# Patient Record
Sex: Female | Born: 1935 | Race: White | Hispanic: No | Smoking: Former smoker
Health system: Southern US, Community
[De-identification: ages and names within clinical notes are randomized; demographics above are authoritative.]

## PROBLEM LIST (undated history)

## (undated) DIAGNOSIS — M545 Low back pain, unspecified: Secondary | ICD-10-CM

## (undated) DIAGNOSIS — G309 Alzheimer's disease, unspecified: Secondary | ICD-10-CM

## (undated) DIAGNOSIS — F0280 Dementia in other diseases classified elsewhere without behavioral disturbance: Secondary | ICD-10-CM

## (undated) DIAGNOSIS — I10 Essential (primary) hypertension: Secondary | ICD-10-CM

## (undated) DIAGNOSIS — I639 Cerebral infarction, unspecified: Secondary | ICD-10-CM

## (undated) DIAGNOSIS — I472 Ventricular tachycardia: Secondary | ICD-10-CM

## (undated) DIAGNOSIS — J45909 Unspecified asthma, uncomplicated: Secondary | ICD-10-CM

## (undated) DIAGNOSIS — I480 Paroxysmal atrial fibrillation: Secondary | ICD-10-CM

## (undated) DIAGNOSIS — E039 Hypothyroidism, unspecified: Secondary | ICD-10-CM

## (undated) DIAGNOSIS — Z87891 Personal history of nicotine dependence: Secondary | ICD-10-CM

## (undated) DIAGNOSIS — C50919 Malignant neoplasm of unspecified site of unspecified female breast: Secondary | ICD-10-CM

## (undated) DIAGNOSIS — I82409 Acute embolism and thrombosis of unspecified deep veins of unspecified lower extremity: Secondary | ICD-10-CM

## (undated) DIAGNOSIS — E785 Hyperlipidemia, unspecified: Secondary | ICD-10-CM

## (undated) DIAGNOSIS — Z860101 Personal history of adenomatous and serrated colon polyps: Secondary | ICD-10-CM

## (undated) DIAGNOSIS — I2699 Other pulmonary embolism without acute cor pulmonale: Secondary | ICD-10-CM

## (undated) DIAGNOSIS — C801 Malignant (primary) neoplasm, unspecified: Secondary | ICD-10-CM

## (undated) DIAGNOSIS — K5790 Diverticulosis of intestine, part unspecified, without perforation or abscess without bleeding: Secondary | ICD-10-CM

## (undated) DIAGNOSIS — M67919 Unspecified disorder of synovium and tendon, unspecified shoulder: Secondary | ICD-10-CM

## (undated) DIAGNOSIS — Z853 Personal history of malignant neoplasm of breast: Secondary | ICD-10-CM

## (undated) DIAGNOSIS — M719 Bursopathy, unspecified: Secondary | ICD-10-CM

## (undated) DIAGNOSIS — I609 Nontraumatic subarachnoid hemorrhage, unspecified: Secondary | ICD-10-CM

## (undated) DIAGNOSIS — K219 Gastro-esophageal reflux disease without esophagitis: Secondary | ICD-10-CM

## (undated) DIAGNOSIS — M1990 Unspecified osteoarthritis, unspecified site: Secondary | ICD-10-CM

## (undated) DIAGNOSIS — I5032 Chronic diastolic (congestive) heart failure: Secondary | ICD-10-CM

## (undated) DIAGNOSIS — J189 Pneumonia, unspecified organism: Secondary | ICD-10-CM

## (undated) DIAGNOSIS — IMO0002 Reserved for concepts with insufficient information to code with codable children: Secondary | ICD-10-CM

## (undated) DIAGNOSIS — I272 Pulmonary hypertension, unspecified: Secondary | ICD-10-CM

## (undated) DIAGNOSIS — I872 Venous insufficiency (chronic) (peripheral): Secondary | ICD-10-CM

## (undated) DIAGNOSIS — Z86010 Personal history of colonic polyps: Secondary | ICD-10-CM

## (undated) DIAGNOSIS — N200 Calculus of kidney: Secondary | ICD-10-CM

## (undated) DIAGNOSIS — C3490 Malignant neoplasm of unspecified part of unspecified bronchus or lung: Secondary | ICD-10-CM

## (undated) DIAGNOSIS — G8929 Other chronic pain: Secondary | ICD-10-CM

## (undated) HISTORY — DX: Unspecified osteoarthritis, unspecified site: M19.90

## (undated) HISTORY — DX: Malignant (primary) neoplasm, unspecified: C80.1

## (undated) HISTORY — DX: Personal history of adenomatous and serrated colon polyps: Z86.0101

## (undated) HISTORY — DX: Diverticulosis of intestine, part unspecified, without perforation or abscess without bleeding: K57.90

## (undated) HISTORY — PX: BACK SURGERY: SHX140

## (undated) HISTORY — DX: Hyperlipidemia, unspecified: E78.5

## (undated) HISTORY — PX: LITHOTRIPSY: SUR834

## (undated) HISTORY — PX: ANTERIOR CERVICAL DECOMP/DISCECTOMY FUSION: SHX1161

## (undated) HISTORY — DX: Unspecified asthma, uncomplicated: J45.909

## (undated) HISTORY — DX: Acute embolism and thrombosis of unspecified deep veins of unspecified lower extremity: I82.409

## (undated) HISTORY — PX: ABDOMINAL HYSTERECTOMY: SHX81

## (undated) HISTORY — PX: HIP SURGERY: SHX245

## (undated) HISTORY — PX: VARICOSE VEIN SURGERY: SHX832

## (undated) HISTORY — DX: Reserved for concepts with insufficient information to code with codable children: IMO0002

## (undated) HISTORY — DX: Other pulmonary embolism without acute cor pulmonale: I26.99

## (undated) HISTORY — DX: Essential (primary) hypertension: I10

## (undated) HISTORY — PX: LUMBAR LAMINECTOMY: SHX95

## (undated) HISTORY — DX: Gastro-esophageal reflux disease without esophagitis: K21.9

## (undated) HISTORY — DX: Personal history of colonic polyps: Z86.010

## (undated) HISTORY — PX: LOBECTOMY: SHX5089

---

## 1983-11-30 HISTORY — PX: HEMORRHOID SURGERY: SHX153

## 1985-11-29 HISTORY — PX: KIDNEY STONE SURGERY: SHX686

## 1991-11-06 ENCOUNTER — Encounter (INDEPENDENT_AMBULATORY_CARE_PROVIDER_SITE_OTHER): Payer: Self-pay | Admitting: Gastroenterology

## 1993-11-29 HISTORY — PX: SHOULDER OPEN ROTATOR CUFF REPAIR: SHX2407

## 1995-03-11 ENCOUNTER — Encounter: Payer: Self-pay | Admitting: Gastroenterology

## 1998-04-30 ENCOUNTER — Encounter: Payer: Self-pay | Admitting: Gastroenterology

## 1998-05-12 ENCOUNTER — Encounter: Payer: Self-pay | Admitting: Gastroenterology

## 1998-11-15 ENCOUNTER — Emergency Department (HOSPITAL_COMMUNITY): Admission: EM | Admit: 1998-11-15 | Discharge: 1998-11-15 | Payer: Self-pay | Admitting: Emergency Medicine

## 1998-11-19 ENCOUNTER — Ambulatory Visit (HOSPITAL_COMMUNITY): Admission: RE | Admit: 1998-11-19 | Discharge: 1998-11-19 | Payer: Self-pay | Admitting: Neurosurgery

## 1998-12-02 ENCOUNTER — Ambulatory Visit (HOSPITAL_COMMUNITY): Admission: RE | Admit: 1998-12-02 | Discharge: 1998-12-02 | Payer: Self-pay | Admitting: Neurosurgery

## 1998-12-30 ENCOUNTER — Ambulatory Visit (HOSPITAL_COMMUNITY): Admission: RE | Admit: 1998-12-30 | Discharge: 1998-12-30 | Payer: Self-pay | Admitting: Neurosurgery

## 1999-01-15 ENCOUNTER — Ambulatory Visit (HOSPITAL_COMMUNITY): Admission: RE | Admit: 1999-01-15 | Discharge: 1999-01-15 | Payer: Self-pay | Admitting: Family Medicine

## 1999-02-06 ENCOUNTER — Ambulatory Visit: Admission: RE | Admit: 1999-02-06 | Discharge: 1999-02-06 | Payer: Self-pay | Admitting: Family Medicine

## 1999-03-03 ENCOUNTER — Encounter: Payer: Self-pay | Admitting: *Deleted

## 1999-03-03 ENCOUNTER — Inpatient Hospital Stay (HOSPITAL_COMMUNITY): Admission: AD | Admit: 1999-03-03 | Discharge: 1999-03-05 | Payer: Self-pay | Admitting: *Deleted

## 1999-03-11 ENCOUNTER — Ambulatory Visit: Admission: RE | Admit: 1999-03-11 | Discharge: 1999-03-11 | Payer: Self-pay | Admitting: Pulmonary Disease

## 2000-03-23 ENCOUNTER — Inpatient Hospital Stay (HOSPITAL_COMMUNITY): Admission: EM | Admit: 2000-03-23 | Discharge: 2000-03-25 | Payer: Self-pay | Admitting: *Deleted

## 2000-03-25 ENCOUNTER — Encounter: Payer: Self-pay | Admitting: Internal Medicine

## 2000-06-09 ENCOUNTER — Ambulatory Visit (HOSPITAL_COMMUNITY): Admission: RE | Admit: 2000-06-09 | Discharge: 2000-06-09 | Payer: Self-pay | Admitting: Neurosurgery

## 2000-07-21 ENCOUNTER — Other Ambulatory Visit: Admission: RE | Admit: 2000-07-21 | Discharge: 2000-07-21 | Payer: Self-pay | Admitting: *Deleted

## 2000-09-07 ENCOUNTER — Encounter: Payer: Self-pay | Admitting: Gastroenterology

## 2000-09-07 ENCOUNTER — Ambulatory Visit (HOSPITAL_COMMUNITY): Admission: RE | Admit: 2000-09-07 | Discharge: 2000-09-07 | Payer: Self-pay | Admitting: Gastroenterology

## 2000-09-21 ENCOUNTER — Encounter: Payer: Self-pay | Admitting: Gastroenterology

## 2000-09-21 ENCOUNTER — Other Ambulatory Visit: Admission: RE | Admit: 2000-09-21 | Discharge: 2000-09-21 | Payer: Self-pay | Admitting: Gastroenterology

## 2000-09-21 ENCOUNTER — Encounter (INDEPENDENT_AMBULATORY_CARE_PROVIDER_SITE_OTHER): Payer: Self-pay | Admitting: Specialist

## 2000-11-01 ENCOUNTER — Ambulatory Visit (HOSPITAL_COMMUNITY): Admission: RE | Admit: 2000-11-01 | Discharge: 2000-11-01 | Payer: Self-pay | Admitting: Neurosurgery

## 2000-11-29 HISTORY — PX: BREAST BIOPSY: SHX20

## 2001-02-13 ENCOUNTER — Inpatient Hospital Stay (HOSPITAL_COMMUNITY): Admission: RE | Admit: 2001-02-13 | Discharge: 2001-02-20 | Payer: Self-pay | Admitting: Neurosurgery

## 2001-02-20 ENCOUNTER — Inpatient Hospital Stay (HOSPITAL_COMMUNITY)
Admission: RE | Admit: 2001-02-20 | Discharge: 2001-02-24 | Payer: Self-pay | Admitting: Physical Medicine & Rehabilitation

## 2001-03-16 ENCOUNTER — Encounter: Admission: RE | Admit: 2001-03-16 | Discharge: 2001-03-16 | Payer: Self-pay | Admitting: Neurosurgery

## 2001-04-21 ENCOUNTER — Encounter: Admission: RE | Admit: 2001-04-21 | Discharge: 2001-04-21 | Payer: Self-pay | Admitting: *Deleted

## 2001-04-21 ENCOUNTER — Encounter (INDEPENDENT_AMBULATORY_CARE_PROVIDER_SITE_OTHER): Payer: Self-pay | Admitting: *Deleted

## 2001-04-21 ENCOUNTER — Other Ambulatory Visit: Admission: RE | Admit: 2001-04-21 | Discharge: 2001-04-21 | Payer: Self-pay | Admitting: *Deleted

## 2001-04-29 HISTORY — PX: MASTECTOMY: SHX3

## 2001-05-03 ENCOUNTER — Encounter: Admission: RE | Admit: 2001-05-03 | Discharge: 2001-05-03 | Payer: Self-pay | Admitting: Neurosurgery

## 2001-05-08 ENCOUNTER — Ambulatory Visit: Admission: RE | Admit: 2001-05-08 | Discharge: 2001-05-08 | Payer: Self-pay | Admitting: General Surgery

## 2001-06-02 ENCOUNTER — Encounter (INDEPENDENT_AMBULATORY_CARE_PROVIDER_SITE_OTHER): Payer: Self-pay | Admitting: Specialist

## 2001-06-02 ENCOUNTER — Observation Stay (HOSPITAL_COMMUNITY): Admission: RE | Admit: 2001-06-02 | Discharge: 2001-06-04 | Payer: Self-pay | Admitting: General Surgery

## 2001-07-04 ENCOUNTER — Ambulatory Visit: Admission: RE | Admit: 2001-07-04 | Discharge: 2001-10-02 | Payer: Self-pay | Admitting: Radiation Oncology

## 2001-08-08 ENCOUNTER — Encounter: Admission: RE | Admit: 2001-08-08 | Discharge: 2001-08-08 | Payer: Self-pay | Admitting: Neurosurgery

## 2001-08-31 ENCOUNTER — Ambulatory Visit (HOSPITAL_COMMUNITY): Admission: RE | Admit: 2001-08-31 | Discharge: 2001-08-31 | Payer: Self-pay | Admitting: Radiation Oncology

## 2002-03-30 ENCOUNTER — Encounter: Admission: RE | Admit: 2002-03-30 | Discharge: 2002-03-30 | Payer: Self-pay | Admitting: *Deleted

## 2002-04-16 ENCOUNTER — Encounter: Payer: Self-pay | Admitting: Oncology

## 2002-04-16 ENCOUNTER — Ambulatory Visit (HOSPITAL_COMMUNITY): Admission: RE | Admit: 2002-04-16 | Discharge: 2002-04-16 | Payer: Self-pay | Admitting: Oncology

## 2002-04-20 ENCOUNTER — Encounter: Payer: Self-pay | Admitting: Oncology

## 2002-04-20 ENCOUNTER — Ambulatory Visit (HOSPITAL_COMMUNITY): Admission: RE | Admit: 2002-04-20 | Discharge: 2002-04-20 | Payer: Self-pay | Admitting: Oncology

## 2002-06-07 ENCOUNTER — Encounter: Payer: Self-pay | Admitting: Emergency Medicine

## 2002-06-07 ENCOUNTER — Emergency Department (HOSPITAL_COMMUNITY): Admission: EM | Admit: 2002-06-07 | Discharge: 2002-06-07 | Payer: Self-pay | Admitting: Emergency Medicine

## 2003-03-12 ENCOUNTER — Encounter: Admission: RE | Admit: 2003-03-12 | Discharge: 2003-03-12 | Payer: Self-pay | Admitting: Family Medicine

## 2003-03-12 ENCOUNTER — Encounter: Payer: Self-pay | Admitting: Family Medicine

## 2003-05-29 ENCOUNTER — Emergency Department (HOSPITAL_COMMUNITY): Admission: EM | Admit: 2003-05-29 | Discharge: 2003-05-29 | Payer: Self-pay | Admitting: Emergency Medicine

## 2003-05-29 ENCOUNTER — Encounter: Payer: Self-pay | Admitting: Emergency Medicine

## 2003-07-04 ENCOUNTER — Encounter: Payer: Self-pay | Admitting: Gastroenterology

## 2003-07-04 ENCOUNTER — Encounter: Payer: Self-pay | Admitting: Oncology

## 2003-07-04 ENCOUNTER — Ambulatory Visit (HOSPITAL_COMMUNITY): Admission: RE | Admit: 2003-07-04 | Discharge: 2003-07-04 | Payer: Self-pay | Admitting: Oncology

## 2003-07-11 ENCOUNTER — Encounter: Admission: RE | Admit: 2003-07-11 | Discharge: 2003-10-09 | Payer: Self-pay | Admitting: Oncology

## 2003-07-18 ENCOUNTER — Encounter: Payer: Self-pay | Admitting: Family Medicine

## 2003-07-18 ENCOUNTER — Encounter: Admission: RE | Admit: 2003-07-18 | Discharge: 2003-07-18 | Payer: Self-pay | Admitting: Family Medicine

## 2003-07-25 ENCOUNTER — Inpatient Hospital Stay (HOSPITAL_COMMUNITY): Admission: RE | Admit: 2003-07-25 | Discharge: 2003-07-26 | Payer: Self-pay | Admitting: Neurosurgery

## 2003-11-14 ENCOUNTER — Encounter: Payer: Self-pay | Admitting: Gastroenterology

## 2003-11-20 ENCOUNTER — Encounter: Payer: Self-pay | Admitting: Gastroenterology

## 2003-12-04 ENCOUNTER — Other Ambulatory Visit: Admission: RE | Admit: 2003-12-04 | Discharge: 2003-12-04 | Payer: Self-pay | Admitting: Family Medicine

## 2004-08-17 ENCOUNTER — Emergency Department (HOSPITAL_COMMUNITY): Admission: EM | Admit: 2004-08-17 | Discharge: 2004-08-18 | Payer: Self-pay | Admitting: Emergency Medicine

## 2004-09-17 ENCOUNTER — Ambulatory Visit (HOSPITAL_COMMUNITY): Admission: RE | Admit: 2004-09-17 | Discharge: 2004-09-17 | Payer: Self-pay | Admitting: Neurosurgery

## 2004-10-06 ENCOUNTER — Ambulatory Visit: Payer: Self-pay | Admitting: Family Medicine

## 2004-11-02 ENCOUNTER — Inpatient Hospital Stay (HOSPITAL_COMMUNITY): Admission: RE | Admit: 2004-11-02 | Discharge: 2004-11-06 | Payer: Self-pay | Admitting: Neurosurgery

## 2004-12-17 ENCOUNTER — Ambulatory Visit: Payer: Self-pay | Admitting: Family Medicine

## 2004-12-28 ENCOUNTER — Ambulatory Visit: Payer: Self-pay | Admitting: Oncology

## 2005-01-29 ENCOUNTER — Ambulatory Visit: Payer: Self-pay | Admitting: Family Medicine

## 2005-02-16 ENCOUNTER — Ambulatory Visit: Payer: Self-pay | Admitting: Oncology

## 2005-03-17 ENCOUNTER — Ambulatory Visit: Payer: Self-pay | Admitting: Family Medicine

## 2005-05-12 ENCOUNTER — Ambulatory Visit: Payer: Self-pay | Admitting: Family Medicine

## 2005-05-14 ENCOUNTER — Encounter: Admission: RE | Admit: 2005-05-14 | Discharge: 2005-05-14 | Payer: Self-pay | Admitting: Family Medicine

## 2005-07-07 ENCOUNTER — Ambulatory Visit: Payer: Self-pay | Admitting: Family Medicine

## 2005-09-21 ENCOUNTER — Ambulatory Visit (HOSPITAL_COMMUNITY): Admission: RE | Admit: 2005-09-21 | Discharge: 2005-09-21 | Payer: Self-pay | Admitting: Neurosurgery

## 2005-10-12 ENCOUNTER — Ambulatory Visit: Payer: Self-pay | Admitting: Family Medicine

## 2005-10-27 ENCOUNTER — Ambulatory Visit: Payer: Self-pay | Admitting: Family Medicine

## 2005-11-02 ENCOUNTER — Ambulatory Visit: Payer: Self-pay | Admitting: Gastroenterology

## 2005-11-08 ENCOUNTER — Ambulatory Visit: Payer: Self-pay | Admitting: Gastroenterology

## 2005-11-29 HISTORY — PX: CHOLECYSTECTOMY: SHX55

## 2006-01-28 ENCOUNTER — Ambulatory Visit: Payer: Self-pay | Admitting: Oncology

## 2006-02-15 ENCOUNTER — Ambulatory Visit: Payer: Self-pay | Admitting: Family Medicine

## 2006-02-18 ENCOUNTER — Encounter: Admission: RE | Admit: 2006-02-18 | Discharge: 2006-02-18 | Payer: Self-pay | Admitting: Oncology

## 2006-03-09 ENCOUNTER — Ambulatory Visit: Payer: Self-pay | Admitting: Gastroenterology

## 2006-03-10 ENCOUNTER — Ambulatory Visit: Payer: Self-pay | Admitting: Gastroenterology

## 2006-03-14 ENCOUNTER — Encounter: Payer: Self-pay | Admitting: Gastroenterology

## 2006-03-14 ENCOUNTER — Ambulatory Visit (HOSPITAL_COMMUNITY): Admission: RE | Admit: 2006-03-14 | Discharge: 2006-03-14 | Payer: Self-pay | Admitting: Gastroenterology

## 2006-03-22 ENCOUNTER — Ambulatory Visit: Payer: Self-pay | Admitting: Family Medicine

## 2006-03-22 ENCOUNTER — Other Ambulatory Visit: Admission: RE | Admit: 2006-03-22 | Discharge: 2006-03-22 | Payer: Self-pay | Admitting: Family Medicine

## 2006-03-22 ENCOUNTER — Encounter: Payer: Self-pay | Admitting: Family Medicine

## 2006-03-22 LAB — CONVERTED CEMR LAB

## 2006-06-08 ENCOUNTER — Ambulatory Visit: Payer: Self-pay | Admitting: Family Medicine

## 2006-07-28 ENCOUNTER — Ambulatory Visit: Payer: Self-pay | Admitting: Family Medicine

## 2006-08-02 ENCOUNTER — Ambulatory Visit: Payer: Self-pay | Admitting: Family Medicine

## 2006-08-03 ENCOUNTER — Encounter: Payer: Self-pay | Admitting: Gastroenterology

## 2006-08-03 ENCOUNTER — Ambulatory Visit: Payer: Self-pay | Admitting: *Deleted

## 2006-09-06 ENCOUNTER — Ambulatory Visit: Payer: Self-pay | Admitting: Gastroenterology

## 2006-09-09 ENCOUNTER — Ambulatory Visit: Payer: Self-pay | Admitting: Gastroenterology

## 2006-09-27 ENCOUNTER — Encounter: Payer: Self-pay | Admitting: Family Medicine

## 2006-10-06 ENCOUNTER — Ambulatory Visit: Payer: Self-pay | Admitting: Family Medicine

## 2006-10-24 ENCOUNTER — Encounter (INDEPENDENT_AMBULATORY_CARE_PROVIDER_SITE_OTHER): Payer: Self-pay | Admitting: Specialist

## 2006-10-24 ENCOUNTER — Encounter (INDEPENDENT_AMBULATORY_CARE_PROVIDER_SITE_OTHER): Payer: Self-pay | Admitting: *Deleted

## 2006-10-24 ENCOUNTER — Ambulatory Visit (HOSPITAL_COMMUNITY): Admission: RE | Admit: 2006-10-24 | Discharge: 2006-10-24 | Payer: Self-pay | Admitting: General Surgery

## 2007-01-10 ENCOUNTER — Encounter: Payer: Self-pay | Admitting: Family Medicine

## 2007-01-19 ENCOUNTER — Encounter: Admission: RE | Admit: 2007-01-19 | Discharge: 2007-01-19 | Payer: Self-pay | Admitting: Neurosurgery

## 2007-01-26 ENCOUNTER — Ambulatory Visit: Payer: Self-pay | Admitting: Oncology

## 2007-01-31 LAB — COMPREHENSIVE METABOLIC PANEL
ALT: 13 U/L (ref 0–35)
AST: 22 U/L (ref 0–37)
BUN: 30 mg/dL — ABNORMAL HIGH (ref 6–23)
CO2: 26 mEq/L (ref 19–32)
Creatinine, Ser: 1.14 mg/dL (ref 0.40–1.20)
Total Bilirubin: 0.8 mg/dL (ref 0.3–1.2)

## 2007-01-31 LAB — CBC WITH DIFFERENTIAL/PLATELET
BASO%: 0.2 % (ref 0.0–2.0)
EOS%: 1.3 % (ref 0.0–7.0)
MCH: 29.2 pg (ref 26.0–34.0)
MCHC: 34.7 g/dL (ref 32.0–36.0)
MONO#: 0.6 10*3/uL (ref 0.1–0.9)
RBC: 4.42 10*6/uL (ref 3.70–5.32)
RDW: 13.6 % (ref 11.3–14.5)
WBC: 7.6 10*3/uL (ref 3.9–10.0)
lymph#: 1.7 10*3/uL (ref 0.9–3.3)

## 2007-01-31 LAB — LACTATE DEHYDROGENASE: LDH: 211 U/L (ref 94–250)

## 2007-02-07 ENCOUNTER — Encounter: Payer: Self-pay | Admitting: Family Medicine

## 2007-02-15 ENCOUNTER — Ambulatory Visit (HOSPITAL_COMMUNITY): Admission: RE | Admit: 2007-02-15 | Discharge: 2007-02-15 | Payer: Self-pay | Admitting: Anesthesiology

## 2007-04-08 ENCOUNTER — Encounter: Admission: RE | Admit: 2007-04-08 | Discharge: 2007-04-08 | Payer: Self-pay | Admitting: Orthopedic Surgery

## 2007-04-12 ENCOUNTER — Inpatient Hospital Stay (HOSPITAL_COMMUNITY): Admission: RE | Admit: 2007-04-12 | Discharge: 2007-04-13 | Payer: Self-pay | Admitting: Neurosurgery

## 2007-06-06 ENCOUNTER — Encounter: Payer: Self-pay | Admitting: Family Medicine

## 2007-06-06 DIAGNOSIS — Z8739 Personal history of other diseases of the musculoskeletal system and connective tissue: Secondary | ICD-10-CM

## 2007-06-06 DIAGNOSIS — E785 Hyperlipidemia, unspecified: Secondary | ICD-10-CM | POA: Insufficient documentation

## 2007-06-06 DIAGNOSIS — I10 Essential (primary) hypertension: Secondary | ICD-10-CM

## 2007-06-14 ENCOUNTER — Encounter: Payer: Self-pay | Admitting: Family Medicine

## 2007-06-14 ENCOUNTER — Ambulatory Visit: Payer: Self-pay | Admitting: Family Medicine

## 2007-06-14 LAB — CONVERTED CEMR LAB
BUN: 21 mg/dL (ref 6–23)
Calcium: 9.5 mg/dL (ref 8.4–10.5)
Cholesterol: 247 mg/dL (ref 0–200)
Creatinine, Ser: 0.9 mg/dL (ref 0.4–1.2)
Direct LDL: 144.3 mg/dL
Eosinophils Relative: 2.4 % (ref 0.0–5.0)
GFR calc non Af Amer: 66 mL/min
Glucose, Bld: 107 mg/dL — ABNORMAL HIGH (ref 70–99)
Hemoglobin: 14 g/dL (ref 12.0–15.0)
Lymphocytes Relative: 26.5 % (ref 12.0–46.0)
MCV: 86.1 fL (ref 78.0–100.0)
Monocytes Relative: 8.2 % (ref 3.0–11.0)
Neutrophils Relative %: 62.9 % (ref 43.0–77.0)
RDW: 13.2 % (ref 11.5–14.6)
VLDL: 92 mg/dL — ABNORMAL HIGH (ref 0–40)
WBC: 6.3 10*3/uL (ref 4.5–10.5)

## 2007-08-24 ENCOUNTER — Ambulatory Visit: Payer: Self-pay | Admitting: Family Medicine

## 2007-08-24 DIAGNOSIS — E039 Hypothyroidism, unspecified: Secondary | ICD-10-CM | POA: Insufficient documentation

## 2007-08-30 LAB — CONVERTED CEMR LAB
HDL: 44.7 mg/dL (ref >39.0)
TSH: 2.74 microintl units/mL (ref 0.35–5.50)
Total CHOL/HDL Ratio: 4.8
Triglycerides: 150 mg/dL — ABNORMAL HIGH (ref 0–149)
VLDL: 30 mg/dL (ref 0–40)

## 2007-09-21 ENCOUNTER — Telehealth (INDEPENDENT_AMBULATORY_CARE_PROVIDER_SITE_OTHER): Payer: Self-pay | Admitting: *Deleted

## 2007-09-29 ENCOUNTER — Ambulatory Visit: Payer: Self-pay | Admitting: Family Medicine

## 2007-10-04 ENCOUNTER — Encounter: Payer: Self-pay | Admitting: Family Medicine

## 2007-10-12 ENCOUNTER — Telehealth: Payer: Self-pay | Admitting: Family Medicine

## 2007-11-01 ENCOUNTER — Telehealth: Payer: Self-pay | Admitting: Family Medicine

## 2007-11-02 ENCOUNTER — Ambulatory Visit: Payer: Self-pay | Admitting: Family Medicine

## 2007-11-07 ENCOUNTER — Encounter: Payer: Self-pay | Admitting: Family Medicine

## 2007-12-04 ENCOUNTER — Ambulatory Visit (HOSPITAL_COMMUNITY): Admission: RE | Admit: 2007-12-04 | Discharge: 2007-12-04 | Payer: Self-pay | Admitting: Orthopedic Surgery

## 2007-12-20 ENCOUNTER — Ambulatory Visit: Payer: Self-pay | Admitting: Family Medicine

## 2008-01-09 ENCOUNTER — Ambulatory Visit (HOSPITAL_COMMUNITY): Admission: RE | Admit: 2008-01-09 | Discharge: 2008-01-09 | Payer: Self-pay | Admitting: Orthopedic Surgery

## 2008-01-26 ENCOUNTER — Ambulatory Visit: Payer: Self-pay | Admitting: Oncology

## 2008-01-30 LAB — COMPREHENSIVE METABOLIC PANEL WITH GFR
ALT: 18 U/L (ref 0–35)
AST: 21 U/L (ref 0–37)
Albumin: 4.5 g/dL (ref 3.5–5.2)
Alkaline Phosphatase: 85 U/L (ref 39–117)
BUN: 21 mg/dL (ref 6–23)
CO2: 30 meq/L (ref 19–32)
Calcium: 9.5 mg/dL (ref 8.4–10.5)
Chloride: 100 meq/L (ref 96–112)
Creatinine, Ser: 0.97 mg/dL (ref 0.40–1.20)
Glucose, Bld: 110 mg/dL — ABNORMAL HIGH (ref 70–99)
Potassium: 3.8 meq/L (ref 3.5–5.3)
Sodium: 140 meq/L (ref 135–145)
Total Bilirubin: 0.6 mg/dL (ref 0.3–1.2)
Total Protein: 7.6 g/dL (ref 6.0–8.3)

## 2008-01-30 LAB — CBC WITH DIFFERENTIAL/PLATELET
BASO%: 0.5 % (ref 0.0–2.0)
Basophils Absolute: 0 10e3/uL (ref 0.0–0.1)
EOS%: 4 % (ref 0.0–7.0)
Eosinophils Absolute: 0.3 10e3/uL (ref 0.0–0.5)
HCT: 39.1 % (ref 34.8–46.6)
HGB: 13.4 g/dL (ref 11.6–15.9)
LYMPH%: 26.9 % (ref 14.0–48.0)
MCH: 28.6 pg (ref 26.0–34.0)
MCHC: 34.2 g/dL (ref 32.0–36.0)
MCV: 83.7 fL (ref 81.0–101.0)
MONO#: 0.5 10e3/uL (ref 0.1–0.9)
MONO%: 6.8 % (ref 0.0–13.0)
NEUT#: 4.6 10e3/uL (ref 1.5–6.5)
NEUT%: 61.8 % (ref 39.6–76.8)
Platelets: 360 10e3/uL (ref 145–400)
RBC: 4.67 10e6/uL (ref 3.70–5.32)
RDW: 13.9 % (ref 11.3–14.5)
WBC: 7.5 10e3/uL (ref 3.9–10.0)
lymph#: 2 10e3/uL (ref 0.9–3.3)

## 2008-01-30 LAB — LACTATE DEHYDROGENASE: LDH: 185 U/L (ref 94–250)

## 2008-02-01 ENCOUNTER — Ambulatory Visit: Payer: Self-pay | Admitting: Family Medicine

## 2008-02-06 ENCOUNTER — Encounter: Payer: Self-pay | Admitting: Family Medicine

## 2008-02-07 ENCOUNTER — Encounter: Payer: Self-pay | Admitting: Family Medicine

## 2008-02-16 ENCOUNTER — Encounter: Payer: Self-pay | Admitting: Family Medicine

## 2008-02-16 ENCOUNTER — Ambulatory Visit (HOSPITAL_COMMUNITY): Admission: RE | Admit: 2008-02-16 | Discharge: 2008-02-16 | Payer: Self-pay | Admitting: Internal Medicine

## 2008-02-16 ENCOUNTER — Ambulatory Visit: Payer: Self-pay | Admitting: Internal Medicine

## 2008-02-16 DIAGNOSIS — M542 Cervicalgia: Secondary | ICD-10-CM | POA: Insufficient documentation

## 2008-02-16 DIAGNOSIS — R079 Chest pain, unspecified: Secondary | ICD-10-CM | POA: Insufficient documentation

## 2008-02-22 ENCOUNTER — Ambulatory Visit: Payer: Self-pay | Admitting: Family Medicine

## 2008-02-22 DIAGNOSIS — Z853 Personal history of malignant neoplasm of breast: Secondary | ICD-10-CM

## 2008-02-22 HISTORY — DX: Personal history of malignant neoplasm of breast: Z85.3

## 2008-05-14 ENCOUNTER — Ambulatory Visit: Payer: Self-pay | Admitting: Family Medicine

## 2008-05-16 ENCOUNTER — Ambulatory Visit: Payer: Self-pay | Admitting: Cardiology

## 2008-05-17 LAB — CONVERTED CEMR LAB
Albumin: 4 g/dL (ref 3.5–5.2)
Basophils Relative: 0.4 % (ref 0.0–1.0)
Chloride: 105 meq/L (ref 96–112)
Creatinine, Ser: 0.9 mg/dL (ref 0.4–1.2)
Eosinophils Absolute: 0.1 10*3/uL (ref 0.0–0.7)
GFR calc Af Amer: 80 mL/min
Lymphocytes Relative: 29.2 % (ref 12.0–46.0)
MCHC: 34.2 g/dL (ref 30.0–36.0)
Monocytes Absolute: 0.4 10*3/uL (ref 0.1–1.0)
Potassium: 4.5 meq/L (ref 3.5–5.1)
RBC: 4.52 M/uL (ref 3.87–5.11)
Total CK: 90 units/L (ref 7–177)
WBC: 6.1 10*3/uL (ref 4.5–10.5)

## 2008-05-24 ENCOUNTER — Encounter: Admission: RE | Admit: 2008-05-24 | Discharge: 2008-05-24 | Payer: Self-pay | Admitting: Neurosurgery

## 2008-05-30 ENCOUNTER — Encounter: Payer: Self-pay | Admitting: Family Medicine

## 2008-08-07 ENCOUNTER — Other Ambulatory Visit: Admission: RE | Admit: 2008-08-07 | Discharge: 2008-08-07 | Payer: Self-pay | Admitting: Family Medicine

## 2008-08-07 ENCOUNTER — Encounter: Payer: Self-pay | Admitting: Family Medicine

## 2008-08-07 ENCOUNTER — Ambulatory Visit: Payer: Self-pay | Admitting: Family Medicine

## 2008-08-12 ENCOUNTER — Encounter: Payer: Self-pay | Admitting: Family Medicine

## 2008-08-15 ENCOUNTER — Ambulatory Visit: Payer: Self-pay | Admitting: Family Medicine

## 2008-08-16 ENCOUNTER — Encounter: Payer: Self-pay | Admitting: Family Medicine

## 2008-08-30 ENCOUNTER — Ambulatory Visit: Payer: Self-pay | Admitting: Family Medicine

## 2008-09-16 ENCOUNTER — Inpatient Hospital Stay (HOSPITAL_COMMUNITY): Admission: RE | Admit: 2008-09-16 | Discharge: 2008-09-20 | Payer: Self-pay | Admitting: Neurosurgery

## 2008-09-20 ENCOUNTER — Ambulatory Visit: Payer: Self-pay | Admitting: Physical Medicine & Rehabilitation

## 2008-09-20 ENCOUNTER — Inpatient Hospital Stay (HOSPITAL_COMMUNITY)
Admission: RE | Admit: 2008-09-20 | Discharge: 2008-09-28 | Payer: Self-pay | Admitting: Physical Medicine & Rehabilitation

## 2008-09-27 ENCOUNTER — Encounter: Payer: Self-pay | Admitting: Family Medicine

## 2008-10-22 ENCOUNTER — Encounter: Payer: Self-pay | Admitting: Family Medicine

## 2008-10-29 ENCOUNTER — Encounter: Payer: Self-pay | Admitting: Family Medicine

## 2008-11-05 ENCOUNTER — Encounter: Payer: Self-pay | Admitting: Family Medicine

## 2008-11-26 ENCOUNTER — Encounter: Payer: Self-pay | Admitting: Family Medicine

## 2008-12-23 ENCOUNTER — Encounter: Payer: Self-pay | Admitting: Family Medicine

## 2008-12-30 ENCOUNTER — Encounter: Admission: RE | Admit: 2008-12-30 | Discharge: 2008-12-30 | Payer: Self-pay | Admitting: Neurosurgery

## 2009-01-12 ENCOUNTER — Encounter: Admission: RE | Admit: 2009-01-12 | Discharge: 2009-01-12 | Payer: Self-pay | Admitting: Orthopedic Surgery

## 2009-01-24 ENCOUNTER — Ambulatory Visit: Payer: Self-pay | Admitting: Oncology

## 2009-01-29 LAB — CBC WITH DIFFERENTIAL/PLATELET
BASO%: 0.4 % (ref 0.0–2.0)
EOS%: 3.2 % (ref 0.0–7.0)
MCH: 27.4 pg (ref 25.1–34.0)
MCV: 80.7 fL (ref 79.5–101.0)
MONO%: 7.2 % (ref 0.0–14.0)
RBC: 4.34 10*6/uL (ref 3.70–5.45)
RDW: 15.5 % — ABNORMAL HIGH (ref 11.2–14.5)

## 2009-01-29 LAB — COMPREHENSIVE METABOLIC PANEL
AST: 23 U/L (ref 0–37)
Albumin: 3.9 g/dL (ref 3.5–5.2)
Alkaline Phosphatase: 123 U/L — ABNORMAL HIGH (ref 39–117)
Potassium: 3.6 mEq/L (ref 3.5–5.3)
Sodium: 139 mEq/L (ref 135–145)
Total Protein: 8.5 g/dL — ABNORMAL HIGH (ref 6.0–8.3)

## 2009-02-04 ENCOUNTER — Encounter: Payer: Self-pay | Admitting: Family Medicine

## 2009-02-04 ENCOUNTER — Ambulatory Visit: Payer: Self-pay | Admitting: Gastroenterology

## 2009-02-04 LAB — COMPREHENSIVE METABOLIC PANEL
Albumin: 4.2 g/dL (ref 3.5–5.2)
Alkaline Phosphatase: 128 U/L — ABNORMAL HIGH (ref 39–117)
BUN: 19 mg/dL (ref 6–23)
Glucose, Bld: 102 mg/dL — ABNORMAL HIGH (ref 70–99)
Potassium: 4.1 mEq/L (ref 3.5–5.3)
Total Bilirubin: 0.6 mg/dL (ref 0.3–1.2)

## 2009-02-06 LAB — IMMUNOFIXATION ELECTROPHORESIS: IgA: 389 mg/dL — ABNORMAL HIGH (ref 68–378)

## 2009-02-12 ENCOUNTER — Ambulatory Visit: Payer: Self-pay | Admitting: Family Medicine

## 2009-02-12 DIAGNOSIS — G609 Hereditary and idiopathic neuropathy, unspecified: Secondary | ICD-10-CM | POA: Insufficient documentation

## 2009-02-12 DIAGNOSIS — D649 Anemia, unspecified: Secondary | ICD-10-CM | POA: Insufficient documentation

## 2009-02-12 DIAGNOSIS — N390 Urinary tract infection, site not specified: Secondary | ICD-10-CM

## 2009-02-12 DIAGNOSIS — E559 Vitamin D deficiency, unspecified: Secondary | ICD-10-CM | POA: Insufficient documentation

## 2009-02-12 LAB — CONVERTED CEMR LAB
Ketones, urine, test strip: NEGATIVE
pH: 6

## 2009-02-15 ENCOUNTER — Ambulatory Visit (HOSPITAL_COMMUNITY): Admission: RE | Admit: 2009-02-15 | Discharge: 2009-02-15 | Payer: Self-pay | Admitting: Oncology

## 2009-02-15 ENCOUNTER — Encounter: Payer: Self-pay | Admitting: Family Medicine

## 2009-02-18 ENCOUNTER — Ambulatory Visit: Payer: Self-pay | Admitting: Gastroenterology

## 2009-02-18 ENCOUNTER — Encounter: Payer: Self-pay | Admitting: Gastroenterology

## 2009-02-18 HISTORY — PX: COLONOSCOPY: SHX174

## 2009-02-18 LAB — HM COLONOSCOPY

## 2009-02-19 ENCOUNTER — Encounter: Payer: Self-pay | Admitting: Gastroenterology

## 2009-02-24 ENCOUNTER — Encounter: Payer: Self-pay | Admitting: Family Medicine

## 2009-02-24 LAB — CONVERTED CEMR LAB
ALT: 12 units/L (ref 0–35)
AST: 21 units/L (ref 0–37)
Alkaline Phosphatase: 105 units/L (ref 39–117)
BUN: 17 mg/dL (ref 6–23)
CO2: 30 meq/L (ref 19–32)
Chloride: 104 meq/L (ref 96–112)
Cholesterol: 196 mg/dL (ref 0–200)
GFR calc non Af Amer: 87.72 mL/min (ref >60)
HCT: 35.2 % — ABNORMAL LOW (ref 36.0–46.0)
HDL: 40.2 mg/dL (ref >39.00)
Hemoglobin: 11.6 g/dL — ABNORMAL LOW (ref 12.0–15.0)
LDL Cholesterol: 138 mg/dL — ABNORMAL HIGH (ref 0–99)
MCHC: 33 g/dL (ref 30.0–36.0)
MCV: 81.3 fL (ref 78.0–100.0)
Monocytes Absolute: 0.4 10*3/uL (ref 0.1–1.0)
Monocytes Relative: 7.8 % (ref 3.0–12.0)
Neutrophils Relative %: 61.6 % (ref 43.0–77.0)
Potassium: 3.8 meq/L (ref 3.5–5.1)
TSH: 2.42 microintl units/mL (ref 0.35–5.50)
Total CHOL/HDL Ratio: 5
VLDL: 17.8 mg/dL (ref 0.0–40.0)

## 2009-02-26 LAB — CONVERTED CEMR LAB: Vit D, 25-Hydroxy: 18 ng/mL — ABNORMAL LOW (ref 30–89)

## 2009-03-12 ENCOUNTER — Telehealth: Payer: Self-pay | Admitting: Family Medicine

## 2009-03-21 ENCOUNTER — Encounter: Payer: Self-pay | Admitting: Family Medicine

## 2009-05-22 ENCOUNTER — Ambulatory Visit: Payer: Self-pay | Admitting: Oncology

## 2009-05-22 ENCOUNTER — Ambulatory Visit: Payer: Self-pay | Admitting: Family Medicine

## 2009-05-22 DIAGNOSIS — M549 Dorsalgia, unspecified: Secondary | ICD-10-CM | POA: Insufficient documentation

## 2009-05-27 LAB — COMPREHENSIVE METABOLIC PANEL
AST: 15 U/L (ref 0–37)
Albumin: 4.2 g/dL (ref 3.5–5.2)
Alkaline Phosphatase: 99 U/L (ref 39–117)
BUN: 23 mg/dL (ref 6–23)
Potassium: 4.2 mEq/L (ref 3.5–5.3)

## 2009-07-29 ENCOUNTER — Ambulatory Visit: Payer: Self-pay | Admitting: Family Medicine

## 2009-07-29 DIAGNOSIS — K219 Gastro-esophageal reflux disease without esophagitis: Secondary | ICD-10-CM

## 2009-08-07 ENCOUNTER — Ambulatory Visit: Payer: Self-pay | Admitting: Oncology

## 2009-08-11 ENCOUNTER — Encounter: Payer: Self-pay | Admitting: Family Medicine

## 2009-08-13 LAB — COMPREHENSIVE METABOLIC PANEL
ALT: 11 U/L (ref 0–35)
AST: 16 U/L (ref 0–37)
Alkaline Phosphatase: 88 U/L (ref 39–117)
CO2: 27 mEq/L (ref 19–32)
Creatinine, Ser: 0.95 mg/dL (ref 0.40–1.20)
Total Bilirubin: 0.9 mg/dL (ref 0.3–1.2)

## 2009-08-13 LAB — IMMUNOFIXATION ELECTROPHORESIS
IgA: 307 mg/dL (ref 68–378)
IgM, Serum: 178 mg/dL (ref 60–263)

## 2009-08-13 LAB — VITAMIN D 25 HYDROXY (VIT D DEFICIENCY, FRACTURES): Vit D, 25-Hydroxy: 23 ng/mL — ABNORMAL LOW (ref 30–89)

## 2009-08-18 ENCOUNTER — Encounter: Payer: Self-pay | Admitting: Family Medicine

## 2009-08-26 ENCOUNTER — Ambulatory Visit: Payer: Self-pay | Admitting: Family Medicine

## 2009-09-01 ENCOUNTER — Ambulatory Visit: Payer: Self-pay | Admitting: Gastroenterology

## 2009-09-01 DIAGNOSIS — R932 Abnormal findings on diagnostic imaging of liver and biliary tract: Secondary | ICD-10-CM | POA: Insufficient documentation

## 2009-09-01 DIAGNOSIS — Z860100 Personal history of colon polyps, unspecified: Secondary | ICD-10-CM | POA: Insufficient documentation

## 2009-09-01 LAB — CONVERTED CEMR LAB
Basophils Absolute: 0.1 10*3/uL (ref 0.0–0.1)
Eosinophils Absolute: 0.1 10*3/uL (ref 0.0–0.7)
Eosinophils Relative: 2.2 % (ref 0.0–5.0)
HCT: 37.6 % (ref 36.0–46.0)
Hemoglobin: 12.8 g/dL (ref 12.0–15.0)
Lipase: 44 units/L (ref 11.0–59.0)
Lymphocytes Relative: 26.7 % (ref 12.0–46.0)
MCHC: 34.1 g/dL (ref 30.0–36.0)
MCV: 85.2 fL (ref 78.0–100.0)
Monocytes Absolute: 0.5 10*3/uL (ref 0.1–1.0)
Neutro Abs: 3.4 10*3/uL (ref 1.4–7.7)
Neutrophils Relative %: 61.8 % (ref 43.0–77.0)
RBC: 4.41 M/uL (ref 3.87–5.11)

## 2009-09-24 ENCOUNTER — Telehealth: Payer: Self-pay | Admitting: Family Medicine

## 2009-10-21 ENCOUNTER — Ambulatory Visit: Payer: Self-pay | Admitting: Oncology

## 2009-10-27 ENCOUNTER — Encounter: Payer: Self-pay | Admitting: Family Medicine

## 2009-10-28 LAB — VITAMIN D 25 HYDROXY (VIT D DEFICIENCY, FRACTURES): Vit D, 25-Hydroxy: 24 ng/mL — ABNORMAL LOW (ref 30–89)

## 2009-12-04 ENCOUNTER — Encounter: Payer: Self-pay | Admitting: Family Medicine

## 2009-12-12 ENCOUNTER — Ambulatory Visit (HOSPITAL_COMMUNITY): Admission: RE | Admit: 2009-12-12 | Discharge: 2009-12-12 | Payer: Self-pay | Admitting: Neurosurgery

## 2009-12-16 ENCOUNTER — Encounter: Payer: Self-pay | Admitting: Family Medicine

## 2009-12-18 ENCOUNTER — Encounter: Payer: Self-pay | Admitting: Family Medicine

## 2009-12-30 ENCOUNTER — Ambulatory Visit: Payer: Self-pay | Admitting: Family Medicine

## 2009-12-30 ENCOUNTER — Ambulatory Visit: Payer: Self-pay

## 2009-12-30 DIAGNOSIS — R609 Edema, unspecified: Secondary | ICD-10-CM | POA: Insufficient documentation

## 2009-12-30 DIAGNOSIS — M79609 Pain in unspecified limb: Secondary | ICD-10-CM

## 2010-01-08 ENCOUNTER — Encounter: Payer: Self-pay | Admitting: Family Medicine

## 2010-01-08 ENCOUNTER — Encounter: Admission: RE | Admit: 2010-01-08 | Discharge: 2010-01-08 | Payer: Self-pay | Admitting: Neurosurgery

## 2010-01-20 ENCOUNTER — Telehealth: Payer: Self-pay | Admitting: Family Medicine

## 2010-01-22 ENCOUNTER — Ambulatory Visit: Payer: Self-pay | Admitting: Cardiothoracic Surgery

## 2010-01-22 ENCOUNTER — Encounter: Payer: Self-pay | Admitting: Family Medicine

## 2010-01-26 ENCOUNTER — Ambulatory Visit: Payer: Self-pay | Admitting: Family Medicine

## 2010-01-27 ENCOUNTER — Ambulatory Visit (HOSPITAL_COMMUNITY): Admission: RE | Admit: 2010-01-27 | Discharge: 2010-01-27 | Payer: Self-pay | Admitting: Cardiothoracic Surgery

## 2010-01-29 ENCOUNTER — Ambulatory Visit: Payer: Self-pay | Admitting: Cardiothoracic Surgery

## 2010-01-29 ENCOUNTER — Encounter: Payer: Self-pay | Admitting: Family Medicine

## 2010-03-06 ENCOUNTER — Ambulatory Visit: Payer: Self-pay | Admitting: Family Medicine

## 2010-04-10 ENCOUNTER — Encounter: Admission: RE | Admit: 2010-04-10 | Discharge: 2010-04-10 | Payer: Self-pay | Admitting: Internal Medicine

## 2010-05-19 ENCOUNTER — Encounter: Payer: Self-pay | Admitting: Family Medicine

## 2010-06-04 ENCOUNTER — Telehealth: Payer: Self-pay | Admitting: Family Medicine

## 2010-07-30 ENCOUNTER — Ambulatory Visit: Payer: Self-pay | Admitting: Cardiothoracic Surgery

## 2010-07-30 ENCOUNTER — Encounter: Payer: Self-pay | Admitting: Family Medicine

## 2010-07-30 ENCOUNTER — Encounter: Admission: RE | Admit: 2010-07-30 | Discharge: 2010-07-30 | Payer: Self-pay | Admitting: Cardiothoracic Surgery

## 2010-08-04 ENCOUNTER — Telehealth: Payer: Self-pay | Admitting: Family Medicine

## 2010-08-05 ENCOUNTER — Ambulatory Visit: Admission: RE | Admit: 2010-08-05 | Discharge: 2010-08-05 | Payer: Self-pay | Admitting: Cardiothoracic Surgery

## 2010-08-05 LAB — PULMONARY FUNCTION TEST

## 2010-08-10 ENCOUNTER — Ambulatory Visit: Payer: Self-pay | Admitting: Oncology

## 2010-08-13 ENCOUNTER — Encounter: Payer: Self-pay | Admitting: Family Medicine

## 2010-08-13 LAB — CBC WITH DIFFERENTIAL/PLATELET
BASO%: 0.7 % (ref 0.0–2.0)
Basophils Absolute: 0 10*3/uL (ref 0.0–0.1)
EOS%: 2.4 % (ref 0.0–7.0)
Eosinophils Absolute: 0.1 10*3/uL (ref 0.0–0.5)
HCT: 38.7 % (ref 34.8–46.6)
HGB: 13.1 g/dL (ref 11.6–15.9)
LYMPH%: 32 % (ref 14.0–49.7)
MCH: 29.2 pg (ref 25.1–34.0)
MCHC: 33.9 g/dL (ref 31.5–36.0)
MCV: 86.3 fL (ref 79.5–101.0)
MONO#: 0.4 10*3/uL (ref 0.1–0.9)
MONO%: 9.5 % (ref 0.0–14.0)
NEUT#: 2.5 10*3/uL (ref 1.5–6.5)
NEUT%: 55.4 % (ref 38.4–76.8)
Platelets: 270 10*3/uL (ref 145–400)
RBC: 4.49 10*6/uL (ref 3.70–5.45)
RDW: 14.8 % — ABNORMAL HIGH (ref 11.2–14.5)
WBC: 4.5 10*3/uL (ref 3.9–10.3)
lymph#: 1.4 10*3/uL (ref 0.9–3.3)

## 2010-08-14 LAB — VITAMIN D 25 HYDROXY (VIT D DEFICIENCY, FRACTURES): Vit D, 25-Hydroxy: 41 ng/mL (ref 30–89)

## 2010-08-14 LAB — COMPREHENSIVE METABOLIC PANEL
Alkaline Phosphatase: 80 U/L (ref 39–117)
BUN: 32 mg/dL — ABNORMAL HIGH (ref 6–23)
Glucose, Bld: 98 mg/dL (ref 70–99)
Total Bilirubin: 0.8 mg/dL (ref 0.3–1.2)

## 2010-08-14 LAB — LACTATE DEHYDROGENASE: LDH: 212 U/L (ref 94–250)

## 2010-08-24 ENCOUNTER — Encounter: Payer: Self-pay | Admitting: Family Medicine

## 2010-09-03 ENCOUNTER — Ambulatory Visit (HOSPITAL_COMMUNITY): Admission: RE | Admit: 2010-09-03 | Discharge: 2010-09-03 | Payer: Self-pay | Admitting: Thoracic Surgery

## 2010-09-03 ENCOUNTER — Ambulatory Visit: Payer: Self-pay | Admitting: Thoracic Surgery

## 2010-09-10 ENCOUNTER — Encounter: Payer: Self-pay | Admitting: Family Medicine

## 2010-09-10 ENCOUNTER — Ambulatory Visit: Payer: Self-pay | Admitting: Cardiothoracic Surgery

## 2010-10-07 HISTORY — PX: OTHER SURGICAL HISTORY: SHX169

## 2010-10-08 ENCOUNTER — Ambulatory Visit: Payer: Self-pay | Admitting: Oncology

## 2010-10-12 ENCOUNTER — Encounter: Payer: Self-pay | Admitting: Family Medicine

## 2010-11-10 ENCOUNTER — Ambulatory Visit: Payer: Self-pay | Admitting: Family Medicine

## 2010-12-02 ENCOUNTER — Ambulatory Visit
Admission: RE | Admit: 2010-12-02 | Discharge: 2010-12-02 | Payer: Self-pay | Source: Home / Self Care | Attending: Vascular Surgery | Admitting: Vascular Surgery

## 2010-12-02 ENCOUNTER — Encounter: Payer: Self-pay | Admitting: Family Medicine

## 2010-12-02 ENCOUNTER — Ambulatory Visit: Admit: 2010-12-02 | Payer: Self-pay | Admitting: Vascular Surgery

## 2010-12-03 ENCOUNTER — Encounter
Admission: RE | Admit: 2010-12-03 | Discharge: 2010-12-03 | Payer: Self-pay | Source: Home / Self Care | Attending: Specialist | Admitting: Specialist

## 2010-12-10 ENCOUNTER — Telehealth (INDEPENDENT_AMBULATORY_CARE_PROVIDER_SITE_OTHER): Payer: Self-pay | Admitting: *Deleted

## 2010-12-10 ENCOUNTER — Ambulatory Visit: Payer: Self-pay | Admitting: Oncology

## 2010-12-10 ENCOUNTER — Encounter
Admission: RE | Admit: 2010-12-10 | Discharge: 2010-12-10 | Payer: Self-pay | Source: Home / Self Care | Attending: Cardiothoracic Surgery | Admitting: Cardiothoracic Surgery

## 2010-12-10 ENCOUNTER — Ambulatory Visit
Admission: RE | Admit: 2010-12-10 | Discharge: 2010-12-10 | Payer: Self-pay | Source: Home / Self Care | Attending: Cardiothoracic Surgery | Admitting: Cardiothoracic Surgery

## 2010-12-15 ENCOUNTER — Ambulatory Visit
Admission: RE | Admit: 2010-12-15 | Discharge: 2010-12-15 | Payer: Self-pay | Source: Home / Self Care | Attending: Family Medicine | Admitting: Family Medicine

## 2010-12-15 ENCOUNTER — Encounter: Payer: Self-pay | Admitting: Family Medicine

## 2010-12-15 DIAGNOSIS — M67919 Unspecified disorder of synovium and tendon, unspecified shoulder: Secondary | ICD-10-CM | POA: Insufficient documentation

## 2010-12-15 DIAGNOSIS — M719 Bursopathy, unspecified: Secondary | ICD-10-CM

## 2010-12-15 HISTORY — DX: Unspecified disorder of synovium and tendon, unspecified shoulder: M67.919

## 2010-12-16 ENCOUNTER — Encounter: Payer: Self-pay | Admitting: Family Medicine

## 2010-12-19 ENCOUNTER — Encounter: Payer: Self-pay | Admitting: Gastroenterology

## 2010-12-28 ENCOUNTER — Ambulatory Visit
Admission: RE | Admit: 2010-12-28 | Discharge: 2010-12-28 | Payer: Self-pay | Source: Home / Self Care | Attending: Internal Medicine | Admitting: Internal Medicine

## 2010-12-28 ENCOUNTER — Ambulatory Visit
Admission: RE | Admit: 2010-12-28 | Discharge: 2010-12-28 | Payer: Self-pay | Source: Home / Self Care | Attending: Specialist | Admitting: Specialist

## 2010-12-28 LAB — CONVERTED CEMR LAB: Rapid Strep: NEGATIVE

## 2010-12-31 NOTE — Progress Notes (Signed)
Summary: vein specialist  Phone Note Call from Patient   Caller: Patient Call For: Judithann Sheen MD Summary of Call: Pt wants a vein specialist referral for painful veins.  [phone] Initial call taken by: Lynann Beaver CMA,  June 04, 2010 3:39 PM  Follow-up for Phone Call        refer to [place] [facility]  Follow-up by: Pura Spice, RN,  June 09, 2010 8:47 AM

## 2010-12-31 NOTE — Letter (Signed)
Summary: [facility]  [facility]   Imported By: Maryln Gottron 02/03/2010 12:41:32  _____________________________________________________________________  External Attachment:    Type:   Image     Comment:   External Document

## 2010-12-31 NOTE — Letter (Signed)
Summary: [facility]  [facility]   Imported By: Maryln Gottron 10/21/2010 10:01:55  _____________________________________________________________________  External Attachment:    Type:   Image     Comment:   External Document  Appended Document: [facility] reviewd

## 2010-12-31 NOTE — Assessment & Plan Note (Signed)
Summary: SURGICAL CLEARANCE/PT COMING IN AT 3PM/PER [facility]   Vital Signs:  Patient profile:   75 year old female Weight:      203 pounds Pulse rate:   80 / minute Pulse rhythm:   regular BP sitting:   138 / 84  (right arm)  Vitals Entered By: Kyung Rudd, CMA (December 15, 2010 3:36 PM)  History of Present Illness: This 75 year old white widow is in for surgical clearance regarding surgery the right rotator cuff surgery to be performed by Dr. Payton Emerald and this is to be scheduled at later date Relates history of having a lesion in the right lung which is followed by Dr.Gerhardt He just saw her recently and cleared for followup in 6 months Patient wears support stockings to help treat peripheral edema and PAD She has no pulmonary symptoms at this time and also no cardiac symptoms. Dr. cardiogram done and revealed no acute problems and no reasons not to clear for surgery She has chronic pain in the back and neck and goes to the pain clinic *adenocarcinoma of the breast bilaterally Takes levothyroxine for hypothyroidism Blood pressure under good control Chronic history of GERD which is relieved with Nexium  Current Medications (verified): 1)  Nexium 40 Mg Cpdr (Esomeprazole Magnesium) .Marland Kitchen.. 1 By Mouth Once Daily. 2)  Pain Pill (Name and Dose Unknown) .... Take Two By Mouth Once Daily 3)  Percocet 10-325 Mg  Tabs (Oxycodone-Acetaminophen) .Marland Kitchen.. 1-2 Three Times A Day-Qid As Needed 4)  Adult Aspirin Ec Low Strength 81 Mg  Tbec (Aspirin) .... Take One By Mouth As Needed 5)  Levothyroxine Sodium 75 Mcg  Tabs (Levothyroxine Sodium) .... Once Daily 6)  Lorazepam 1 Mg  Tabs (Lorazepam) .... 1/2 Tablet By Mouth At Bedtime 7)  Vitamin D (Ergocalciferol) 50000 Unit Caps (Ergocalciferol) .... Take One By Mouth Three Times A Week 8)  Diovan Hct 320-25 Mg Tabs (Valsartan-Hydrochlorothiazide) .Marland Kitchen.. 1 Qd 9)  Lasix 40 Mg Tabs (Furosemide) .... Take One Morning and Midafternoon For Edema 10)  Klor-Con M20  20 Meq Cr-Tabs (Potassium Chloride Crys Cr) .Marland Kitchen.. 1 Once Daily For Potassium 11)  Diflucan 150 Mg Tabs (Fluconazole) .... As Needed 12)  Morphine Sulfate 30 Mg Tabs (Morphine Sulfate) .Marland Kitchen.. 1 Tab Three Times A Day  Allergies (verified): 1)  ! Naprosyn (Naproxen) and Ther Nsaids (Naproxen) 2)  ! Penicillin 3)  Hydrocodone-Homatropine (Hydrocodone-Homatropine) 4)  Simvastatin (Simvastatin)  Past History:  Past Medical History: Last updated: 09/01/2009 Hyperlipidemia Hypertension Chronic pain management B/L Breast cancer Adenomatous Colon Polyps 04/1998 GERD Diverticulosis  Past Surgical History: Last updated: 09/01/2009 Cholecystectomy 2007 B/L Mastectomies 2002 Hysterectomy Lumbar laminectomy Rotator cuff repair cervical spine surgery x 2 last  one last year Dr Lovell Sheehan..   Hip surgery  Family History: Last updated: 09/01/2009 Family History of Heart Disease: Father, Brother, Sister No FH of Colon Cancer: Family History of Colon Polyps:daughter Family History of Diabetes: daughter X18  Social History: Last updated: 09/01/2009 widowed Former Smoker Alcohol Use - no Daily Caffeine Use Illicit Drug Use - no Patient gets regular exercise.  Risk Factors: Smoking Status: quit (03/06/2010)  Review of Systems      See HPI  Physical Exam  General:  Well-developed,well-nourished,in no acute distress; alert,appropriate and cooperative throughout examination Head:  Normocephalic and atraumatic without obvious abnormalities. No apparent alopecia or balding. Eyes:  No corneal or conjunctival inflammation noted. EOMI. Perrla. Funduscopic exam benign, without hemorrhages, exudates or papilledema. Vision grossly normal. Ears:  External ear exam shows no significant  lesions or deformities.  Otoscopic examination reveals clear canals, tympanic membranes are intact bilaterally without bulging, retraction, inflammation or discharge. Hearing is grossly normal bilaterally. Nose:   External nasal examination shows no deformity or inflammation. Nasal mucosa are pink and moist without lesions or exudates. Mouth:  Oral mucosa and oropharynx without lesions or exudates.  Teeth in good repair. Neck:  No deformities, masses, or tenderness noted. Chest Wall:  No deformities, masses, or tenderness noted. Breasts:  bilateral mastectomy Lungs:  Normal respiratory effort, chest expands symmetrically. Lungs are clear to auscultation, no crackles or wheezes. Heart:  Normal rate and regular rhythm. S1 and S2 normal without gallop, murmur, click, rub or other extra sounds. Abdomen:  Bowel sounds positive,abdomen soft and non-tender without masses, organomegaly or hernias noted. Rectal:  on exam and Msk:  multiple spinal scars over the neck as well as lumbosacral spine Pulses:  R and L carotid,radial,femoral,dorsalis pedis and posterior tibial pulses are full and equal bilaterally Extremities:  support stockings no evidence of edematous Skin:  Intact without suspicious lesions or rashes Cervical Nodes:  No lymphadenopathy noted Axillary Nodes:  No palpable lymphadenopathy Inguinal Nodes:  No significant adenopathy Psych:  Cognition and judgment appear intact. Alert and cooperative with normal attention span and concentration. No apparent delusions, illusions, hallucinations   Impression & Recommendations:  Problem # 1:  ROTATOR CUFF INJURY, RIGHT SHOULDER (ICD-726.10) Assessment New  Problem # 2:  EDEMA (ICD-782.3) Assessment: Improved  Her updated medication list for this problem includes:    Diovan Hct 320-25 Mg Tabs (Valsartan-hydrochlorothiazide) .Marland Kitchen... 1 qd    Lasix 40 Mg Tabs (Furosemide) .Marland Kitchen... Take one morning and midafternoon for edema  Problem # 3:  GERD (ICD-530.81) Assessment: Improved  The following medications were removed from the medication list:    Robinul-forte 2 Mg Tabs (Glycopyrrolate) ..... One tablet by mouth two times a day Her updated medication list  for this problem includes:    Nexium 40 Mg Cpdr (Esomeprazole magnesium) .Marland Kitchen... 1 by mouth once daily.  Problem # 4:  BACK PAIN, CHRONIC (ICD-724.5) Assessment: Improved  Her updated medication list for this problem includes:    Percocet 10-325 Mg Tabs (Oxycodone-acetaminophen) .Marland Kitchen... 1-2 three times a day-qid as needed    Adult Aspirin Ec Low Strength 81 Mg Tbec (Aspirin) .Marland Kitchen... Take one by mouth as needed    Morphine Sulfate 30 Mg Tabs (Morphine sulfate) .Marland Kitchen... 1 tab three times a day  Problem # 5:  HYPERTENSION (ICD-401.9) Assessment: Improved  Her updated medication list for this problem includes:    Diovan Hct 320-25 Mg Tabs (Valsartan-hydrochlorothiazide) .Marland Kitchen... 1 qd    Lasix 40 Mg Tabs (Furosemide) .Marland Kitchen... Take one morning and midafternoon for edema  Problem # 6:  ADENOCARCINOMA, BREAST, BILATERAL, HX OF (ICD-V10.3)  Complete Medication List: 1)  Nexium 40 Mg Cpdr (Esomeprazole magnesium) .Marland Kitchen.. 1 by mouth once daily. 2)  Pain Pill (name and Dose Unknown)  .... Take two by mouth once daily 3)  Percocet 10-325 Mg Tabs (Oxycodone-acetaminophen) .Marland Kitchen.. 1-2 three times a day-qid as needed 4)  Adult Aspirin Ec Low Strength 81 Mg Tbec (Aspirin) .... Take one by mouth as needed 5)  Levothyroxine Sodium 75 Mcg Tabs (Levothyroxine sodium) .... Once daily 6)  Lorazepam 1 Mg Tabs (Lorazepam) .... 1/2 tablet by mouth at bedtime 7)  Vitamin D (ergocalciferol) 50000 Unit Caps (Ergocalciferol) .... Take one by mouth three times a week 8)  Diovan Hct 320-25 Mg Tabs (Valsartan-hydrochlorothiazide) .Marland Kitchen.. 1 qd 9)  Lasix  40 Mg Tabs (Furosemide) .... Take one morning and midafternoon for edema 10)  Klor-con M20 20 Meq Cr-tabs (Potassium chloride crys cr) .Marland Kitchen.. 1 once daily for potassium 11)  Diflucan 150 Mg Tabs (Fluconazole) .... As needed 12)  Morphine Sulfate 30 Mg Tabs (Morphine sulfate) .Marland Kitchen.. 1 tab three times a day  Patient Instructions: 1)  Surgical clearance for surgery rt rottor cuff surgery 2)  all  medicines refilled 3)  [place] performed for Dr. Payton Emerald   Orders Added: 1)  Est. Patient Level IV [16109]

## 2010-12-31 NOTE — Letter (Signed)
Summary: [facility]  [facility]   Imported By: Maryln Gottron 09/03/2010 12:53:14  _____________________________________________________________________  External Attachment:    Type:   Image     Comment:   External Document

## 2010-12-31 NOTE — Letter (Signed)
Summary: [facility]  [facility]   Imported By: Maryln Gottron 10/01/2010 10:41:23  _____________________________________________________________________  External Attachment:    Type:   Image     Comment:   External Document

## 2010-12-31 NOTE — Assessment & Plan Note (Signed)
Summary: ? pneumonia//ccm   Vital Signs:  Patient profile:   75 year old female Weight:      199 pounds O2 Sat:      97 % on Room air Temp:     98.2 degrees F oral BP sitting:   110 / 74  (right arm) Cuff size:   regular  Vitals Entered By: Duard Brady LPN (March 06, 453 11:14 AM)  O2 Flow:  Room air CC: c/o chest congestion, non-productive cough x2 days , fever 102.0 last night - tylenol relives. Is Patient Diabetic? No   History of Present Illness: Here with her daughter for the sudden onset 3 days ago of chest congestion and coughing up green sputum. Some SOB but no chest pain. Then last night she developed a fever as well. No NVD. Drinking fluids and using Mucinex.   Preventive Screening-Counseling & Management  Alcohol-Tobacco     Smoking Status: quit  Allergies: 1)  ! Naprosyn (Naproxen) and Ther Nsaids (Naproxen) 2)  ! Penicillin 3)  Hydrocodone-Homatropine (Hydrocodone-Homatropine) 4)  Simvastatin (Simvastatin)  Past History:  Past Medical History: Reviewed history from 09/01/2009 and no changes required. Hyperlipidemia Hypertension Chronic pain management B/L Breast cancer Adenomatous Colon Polyps 04/1998 GERD Diverticulosis  Past Surgical History: Reviewed history from 09/01/2009 and no changes required. Cholecystectomy 2007 B/L Mastectomies 2002 Hysterectomy Lumbar laminectomy Rotator cuff repair cervical spine surgery x 2 last  one last year Dr Lovell Sheehan..   Hip surgery  Review of Systems  The patient denies anorexia, weight loss, weight gain, vision loss, decreased hearing, hoarseness, chest pain, syncope, peripheral edema, headaches, hemoptysis, abdominal pain, melena, hematochezia, severe indigestion/heartburn, hematuria, incontinence, genital sores, muscle weakness, suspicious skin lesions, transient blindness, difficulty walking, depression, unusual weight change, abnormal bleeding, enlarged lymph nodes, angioedema, breast masses, and  testicular masses.    Physical Exam  General:  alert, weak Head:  Normocephalic and atraumatic without obvious abnormalities. No apparent alopecia or balding. Eyes:  No corneal or conjunctival inflammation noted. EOMI. Perrla. Funduscopic exam benign, without hemorrhages, exudates or papilledema. Vision grossly normal. Ears:  External ear exam shows no significant lesions or deformities.  Otoscopic examination reveals clear canals, tympanic membranes are intact bilaterally without bulging, retraction, inflammation or discharge. Hearing is grossly normal bilaterally. Nose:  External nasal examination shows no deformity or inflammation. Nasal mucosa are pink and moist without lesions or exudates. Mouth:  Oral mucosa and oropharynx without lesions or exudates.  Teeth in good repair. Neck:  No deformities, masses, or tenderness noted. Lungs:  scattered rhonchi and wheezes, rales are present at the left base Heart:  Normal rate and regular rhythm. S1 and S2 normal without gallop, murmur, click, rub or other extra sounds.   Impression & Recommendations:  Problem # 1:  PNEUMONIA (ICD-486)  Her updated medication list for this problem includes:    Levaquin 500 Mg Tabs (Levofloxacin) ..... Once daily  Orders: Rocephin  250mg  (U9811) Admin of Therapeutic Inj  intramuscular or subcutaneous (91478) T-2 View CXR (71020TC)  Complete Medication List: 1)  Nexium 40 Mg Cpdr (Esomeprazole magnesium) .Marland Kitchen.. 1 by mouth once daily. 2)  Pain Pill (name and Dose Unknown)  .... Take two by mouth once daily 3)  Percocet 10-325 Mg Tabs (Oxycodone-acetaminophen) .Marland Kitchen.. 1-2 three times a day-qid as needed 4)  Adult Aspirin Ec Low Strength 81 Mg Tbec (Aspirin) .... Take one by mouth as needed 5)  Levothyroxine Sodium 75 Mcg Tabs (Levothyroxine sodium) .... Once daily 6)  Lorazepam 1 Mg  Tabs (Lorazepam) .... 1/2 tablet by mouth at bedtime 7)  Vitamin D (ergocalciferol) 50000 Unit Caps (Ergocalciferol) .... Take one  by mouth three times a week 8)  Diovan Hct 320-25 Mg Tabs (Valsartan-hydrochlorothiazide) .Marland Kitchen.. 1 qd 9)  Lasix 40 Mg Tabs (Furosemide) .... Take one morning and midafternoon for edema 10)  Robinul-forte 2 Mg Tabs (Glycopyrrolate) .... One tablet by mouth two times a day 11)  Meclizine Hcl 25 Mg Tabs (Meclizine hcl) .Marland Kitchen.. 1 by mouth three times a day for nauseous and dizziness 12)  Klor-con M20 20 Meq Cr-tabs (Potassium chloride crys cr) .Marland Kitchen.. 1 once daily for potassium 13)  Diflucan 150 Mg Tabs (Fluconazole) .... As needed 14)  Levaquin 500 Mg Tabs (Levofloxacin) .... Once daily  Other Orders: Nebulizer Tx (14782)  Patient Instructions: 1)  We will send her for CXR today Prescriptions: LEVAQUIN 500 MG TABS (LEVOFLOXACIN) once daily  #10 x 0   Entered and Authorized by:   Nelwyn Salisbury MD   Signed by:   Nelwyn Salisbury MD on 03/06/2010   Method used:   Elect[facility]ically to        [facility]  [address] [phone]* (retail)       [address]       [place], [place]  [address]       Ph: [phone] or [phone]       Fax: [phone]   RxID:   [id number]    Medication Administration  Injection # 1:    Medication: Rocephin  250mg     Diagnosis: PNEUMONIA (ICD-486)    Route: IM    Site: LUOQ gluteus    Exp Date: 09/2012    Lot #: [id number]    Mfr: [email or web address]    Patient tolerated injection without complications    Given by: Duard Brady LPN (March 06, 8840 12:42 PM)  Orders Added: 1)  Est. Patient Level IV [66063] 2)  Nebulizer Tx [01601] 3)  Rocephin  250mg  [J0696] 4)  Admin of Therapeutic Inj  intramuscular or subcutaneous [96372] 5)  T-2 View CXR [71020TC]

## 2010-12-31 NOTE — Progress Notes (Signed)
Summary: ASAP medical clearance  Phone Note Call from Patient   Caller: Patient Call For: Judithann Sheen MD Summary of Call: Pt has a form from Dr. Shelle Iron for Dr. Scotty Court to fill out for medical clearance for rotator cuff surgery.  Please call this number ASAP to speak to their office.  Needs it ASAP! [phone] Initial call taken by: [facility] CMA AAMA,  December 10, 2010 2:25 PM  Follow-up for Phone Call        Almira Coaster said to sch pt for surgical clearance ov for 12/15/10 at 3pm. Pt has been notified.  Follow-up by: Lucy Antigua,  December 10, 2010 4:44 PM

## 2010-12-31 NOTE — Letter (Signed)
Summary: [facility]  [facility]   Imported By: Maryln Gottron 03/05/2010 12:37:38  _____________________________________________________________________  External Attachment:    Type:   Image     Comment:   External Document

## 2010-12-31 NOTE — Assessment & Plan Note (Signed)
Summary: ?cellulitis/njr   Vital Signs:  Patient profile:   75 year old female Weight:      198 pounds Temp:     98.0 degrees F oral Pulse rate:   88 / minute BP sitting:   138 / 72  (left arm) Cuff size:   large  Vitals Entered By: Alfred Levins, CMA (January 26, 2010 11:19 AM) CC: cellulitis on rt leg   History of Present Illness: Here to follow up on lower extremity edema and cellulitis. She has been on Doxycycline since she was seen by Dr. Scotty Court on 12-30-09. That day she had a venous doppler of the left leg which was negative for DVT. At that time the cellulitis was on the left lower leg, but htis has cleared up. Instead she now has this on the right lower leg, with swelling, redness, warmth, and mild pain.   Allergies: 1)  ! Naprosyn (Naproxen) and Ther Nsaids (Naproxen) 2)  Hydrocodone-Homatropine (Hydrocodone-Homatropine) 3)  Simvastatin (Simvastatin)  Past History:  Past Medical History: Reviewed history from 09/01/2009 and no changes required. Hyperlipidemia Hypertension Chronic pain management B/L Breast cancer Adenomatous Colon Polyps 04/1998 GERD Diverticulosis  Past Surgical History: Reviewed history from 09/01/2009 and no changes required. Cholecystectomy 2007 B/L Mastectomies 2002 Hysterectomy Lumbar laminectomy Rotator cuff repair cervical spine surgery x 2 last  one last year Dr Lovell Sheehan..   Hip surgery  Review of Systems  The patient denies anorexia, fever, weight loss, weight gain, vision loss, decreased hearing, hoarseness, chest pain, syncope, dyspnea on exertion, prolonged cough, headaches, hemoptysis, abdominal pain, melena, hematochezia, severe indigestion/heartburn, hematuria, incontinence, genital sores, muscle weakness, suspicious skin lesions, transient blindness, difficulty walking, depression, unusual weight change, abnormal bleeding, enlarged lymph nodes, angioedema, breast masses, and testicular masses.    Physical Exam  General:   Well-developed,well-nourished,in no acute distress; alert,appropriate and cooperative throughout examination Lungs:  Normal respiratory effort, chest expands symmetrically. Lungs are clear to auscultation, no crackles or wheezes. Heart:  Normal rate and regular rhythm. S1 and S2 normal without gallop, murmur, click, rub or other extra sounds. Pulses:  R posterior tibial normal, R dorsalis pedis normal, L posterior tibial normal, and L dorsalis pedis normal.   Extremities:  2+ edema to both lower legs with numerous varicosities. The left lower leg has no erythema. The right lower leg has some erythema and tenderness above the ankles.    Impression & Recommendations:  Problem # 1:  CELLULITIS, LEG, RIGHT (ICD-682.6)  The following medications were removed from the medication list:    Doxycycline Hyclate 100 Mg Caps (Doxycycline hyclate) .Marland Kitchen... 1 two times a day for infection Her updated medication list for this problem includes:    Levaquin 500 Mg Tabs (Levofloxacin) ..... Once daily  Problem # 2:  EDEMA (ICD-782.3)  Her updated medication list for this problem includes:    Diovan Hct 320-25 Mg Tabs (Valsartan-hydrochlorothiazide) .Marland Kitchen... 1 qd    Lasix 40 Mg Tabs (Furosemide) .Marland Kitchen... Take one morning and midafternoon for edema  Problem # 3:  LEG PAIN (ICD-729.5)  Complete Medication List: 1)  Nexium 40 Mg Cpdr (Esomeprazole magnesium) .Marland Kitchen.. 1 by mouth once daily. 2)  Pain Pill (name and Dose Unknown)  .... Take two by mouth once daily 3)  Percocet 10-325 Mg Tabs (Oxycodone-acetaminophen) .Marland Kitchen.. 1-2 three times a day-qid as needed 4)  Adult Aspirin Ec Low Strength 81 Mg Tbec (Aspirin) .... Take one by mouth as needed 5)  Levothyroxine Sodium 75 Mcg Tabs (Levothyroxine sodium) .... Once daily  6)  Lorazepam 1 Mg Tabs (Lorazepam) .... 1/2 tablet by mouth at bedtime 7)  Vitamin D (ergocalciferol) 50000 Unit Caps (Ergocalciferol) .... Take one by mouth three times a week 8)  Diovan Hct 320-25 Mg Tabs  (Valsartan-hydrochlorothiazide) .Marland Kitchen.. 1 qd 9)  Lasix 40 Mg Tabs (Furosemide) .... Take one morning and midafternoon for edema 10)  Robinul-forte 2 Mg Tabs (Glycopyrrolate) .... One tablet by mouth two times a day 11)  Meclizine Hcl 25 Mg Tabs (Meclizine hcl) .Marland Kitchen.. 1 by mouth three times a day for nauseous and dizziness 12)  Klor-con M20 20 Meq Cr-tabs (Potassium chloride crys cr) .Marland Kitchen.. 1 once daily for potassium 13)  Levaquin 500 Mg Tabs (Levofloxacin) .... Once daily 14)  Diflucan 150 Mg Tabs (Fluconazole) .... As needed  Patient Instructions: 1)  Switch to Levaquin daily for 10 days. Continue wearing compression stockings and elevating legs.  2)  Please schedule a follow-up appointment as needed .  Prescriptions: DIFLUCAN 150 MG TABS (FLUCONAZOLE) as needed  #1 x 5   Entered and Authorized by:   Nelwyn Salisbury MD   Signed by:   Nelwyn Salisbury MD on 01/26/2010   Method used:   [facility]        [facility]  [address] [phone]* (retail)       [address]       [place], [place]  [address]       Ph: [phone] or [phone]       Fax: [phone]   RxID:   [id number] LEVAQUIN 500 MG TABS (LEVOFLOXACIN) once daily  #10 x 0   Entered and Authorized by:   Nelwyn Salisbury MD   Signed by:   Nelwyn Salisbury MD on 01/26/2010   Method used:   [facility]        [facility]  [address] [phone][facility] (retail)       [address]       [place], [place]  [address]       Ph: [phone] or [phone]       Fax: [phone]   RxID:   [id number]

## 2010-12-31 NOTE — Letter (Signed)
Summary: Request for Surgical Clearance/[facility]  Request for Surgical Clearance/[facility]   Imported By: Maryln Gottron 12/17/2010 15:42:17  _____________________________________________________________________  External Attachment:    Type:   Image     Comment:   External Document

## 2010-12-31 NOTE — Letter (Signed)
Summary: [facility]   Imported By: Sherian Rein 05/15/2010 14:48:39  _____________________________________________________________________  External Attachment:    Type:   Image     Comment:   External Document  Appended Document: [facility] reviewed

## 2010-12-31 NOTE — Progress Notes (Signed)
Summary: wants gina to call   Phone Note Call from Patient   Caller: Patient Summary of Call: wants gina to call  Initial call taken by: Pura Spice, RN,  January 20, 2010 11:52 AM  Follow-up for Phone Call        pt returned  call states dr Lovell Sheehan did  CT scan  due to  back pain  and she stated has a cell carcinoma on her lung and wants to let you know she is also SOB  pls advise seeing  seeing dr Jaynie Collins  pt to bring in ct report today  Follow-up by: Pura Spice, RN,  January 20, 2010 12:14 PM  Additional Follow-up for Phone Call Additional follow up Details #1::        pt brought ct report from dr  Delma Officer  and dr Chana Bode called pt,.

## 2010-12-31 NOTE — Miscellaneous (Signed)
Summary: Orders Update  Clinical Lists Changes  Problems: Added new problem of LEG PAIN (ICD-729.5) Orders: Added new Test order of Venous Duplex Lower Extremity (Venous Duplex Lower) - Signed 

## 2010-12-31 NOTE — Letter (Signed)
Summary: [facility]   Imported By: Maryln Gottron 01/07/2010 13:45:24  _____________________________________________________________________  External Attachment:    Type:   Image     Comment:   External Document

## 2010-12-31 NOTE — Assessment & Plan Note (Signed)
Summary: flu shot/cjr   Nurse Visit   Allergies: 1)  ! Naprosyn (Naproxen) and Ther Nsaids (Naproxen) 2)  ! Penicillin 3)  Hydrocodone-Homatropine (Hydrocodone-Homatropine) 4)  Simvastatin (Simvastatin)  Review of Systems       Flu Vaccine Consent Questions     Do you have a history of severe allergic reactions to this vaccine? no    Any prior history of allergic reactions to egg and/or gelatin? no    Do you have a sensitivity to the preservative Thimersol? no    Do you have a past history of Guillan-Barre Syndrome? no    Do you currently have an acute febrile illness? no    Have you ever had a severe reaction to latex? no    Vaccine information given and explained to patient? yes    Are you currently pregnant? no    Lot Number:[id number]   Exp Date:05/29/2011   Site Given  Left Deltoid IM    Orders Added: 1)  Flu Vaccine 73yrs + MEDICARE PATIENTS [Q2039] 2)  Administration Flu vaccine - MCR [G0008]

## 2010-12-31 NOTE — Letter (Signed)
Summary: [facility]  [facility]   Imported By: Maryln Gottron 08/24/2010 15:29:23  _____________________________________________________________________  External Attachment:    Type:   Image     Comment:   External Document

## 2010-12-31 NOTE — Letter (Signed)
Summary: [facility]  [facility]   Imported By: Maryln Gottron 07/01/2010 12:42:21  _____________________________________________________________________  External Attachment:    Type:   Image     Comment:   External Document

## 2010-12-31 NOTE — Assessment & Plan Note (Signed)
Summary: ?painful varicose veins/njr   Vital Signs:  Patient profile:   75 year old female Weight:      200 pounds O2 Sat:      96 % Temp:     97 degrees F oral Pulse rate:   80 / minute Pulse rhythm:   regular Resp:     16 per minute BP sitting:   134 / 76  Vitals Entered By: Lynann Beaver CMA (December 30, 2009 11:22 AM) CC: painful swelling and varicose veins Is Patient Diabetic? No   History of Present Illness: This 75 year old white female with history of having had bilateral mastectomy for adenocarcinoma of the breast is in today complaining of swelling of both lower legs especially the left. Complains of pain over the left calf increased size of veins he complains of knots in the veins which are the valves and this had been present for the past 2-3 weeks and gradually increasing in severity especially the pain in the left calf, floor portion Patient has chronic pain due to her back and neck with multiple surgeries and she goes to the pain clinic to get her analgesics. Which is MS Contin Her oncologist is Dr. Cyndie Chime hypertension stable  Allergies: 1)  ! Naprosyn (Naproxen) and Ther Nsaids (Naproxen) 2)  Hydrocodone-Homatropine (Hydrocodone-Homatropine) 3)  Simvastatin (Simvastatin)  Past History:  Past Medical History: Last updated: 09/01/2009 Hyperlipidemia Hypertension Chronic pain management B/L Breast cancer Adenomatous Colon Polyps 04/1998 GERD Diverticulosis  Past Surgical History: Last updated: 09/01/2009 Cholecystectomy 2007 B/L Mastectomies 2002 Hysterectomy Lumbar laminectomy Rotator cuff repair cervical spine surgery x 2 last  one last year Dr Lovell Sheehan..   Hip surgery  Social History: Last updated: 09/01/2009 widowed Former Smoker Alcohol Use - no Daily Caffeine Use Illicit Drug Use - no Patient gets regular exercise.  Risk Factors: Smoking Status: quit (02/16/2008)  Review of Systems      See HPI General:  See HPI; Complains of  malaise. Eyes:  Denies blurring, discharge, double vision, eye irritation, eye pain, halos, itching, light sensitivity, red eye, vision loss-1 eye, and vision loss-both eyes. ENT:  Denies decreased hearing, difficulty swallowing, ear discharge, earache, hoarseness, nasal congestion, nosebleeds, postnasal drainage, ringing in ears, sinus pressure, and sore throat. CV:  Denies bluish discoloration of lips or nails, chest pain or discomfort, difficulty breathing at night, difficulty breathing while lying down, fainting, fatigue, leg cramps with exertion, lightheadness, near fainting, palpitations, shortness of breath with exertion, swelling of feet, swelling of hands, and weight gain; hypertension control. Resp:  Denies chest discomfort, chest pain with inspiration, cough, coughing up blood, excessive snoring, hypersomnolence, morning headaches, pleuritic, shortness of breath, sputum productive, and wheezing. GI:  Denies abdominal pain, bloody stools, change in bowel habits, constipation, dark tarry stools, diarrhea, excessive appetite, gas, hemorrhoids, indigestion, loss of appetite, nausea, vomiting, vomiting blood, and yellowish skin color. GU:  Denies abnormal vaginal bleeding, decreased libido, discharge, dysuria, genital sores, hematuria, incontinence, nocturia, urinary frequency, and urinary hesitancy. MS:  Complains of low back pain; edema of legs.  Physical Exam  General:  Well-developed,well-nourished,in no acute distress; alert,appropriate and cooperative throughout examination Neck:  No deformities, masses, or tenderness noted. Breasts:  bilateral mastectomy Lungs:  Normal respiratory effort, chest expands symmetrically. Lungs are clear to auscultation, no crackles or wheezes. Heart:  Normal rate and regular rhythm. S1 and S2 normal without gallop, murmur, click, rub or other extra sounds. Abdomen:  Bowel sounds positive,abdomen soft and non-tender without masses, organomegaly or hernias  noted.  Rectal:  not examined Genitalia:  not examined Msk:  tenderness cervical spine as well as lumbar spine Extremities:  2+ right pedal edema.  3+ pretibial edema left Tenderness left 4 catheter the skin over the lower portion of the left leg appears erythematous and slightly tender suspicious of cellulitis Neurologic:  No cranial nerve deficits noted. Station and gait are normal. Plantar reflexes are down-going bilaterally. DTRs are symmetrical throughout. Sensory, motor and coordinative functions appear intact.   Impression & Recommendations:  Problem # 1:  CELLULITIS, LEG, LEFT (ICD-682.6) Assessment New  Her updated medication list for this problem includes:    Doxycycline Hyclate 100 Mg Caps (Doxycycline hyclate) .Marland Kitchen... 1 two times a day for infection  Orders: Prescription Created Electronically [phone])  Problem # 2:  EDEMA (ICD-782.3) Assessment: New  Her updated medication list for this problem includes:    Diovan Hct 320-25 Mg Tabs (Valsartan-hydrochlorothiazide) .Marland Kitchen... 1 qd    Lasix 40 Mg Tabs (Furosemide) .Marland Kitchen... Take one morning and midafternoon for edema  Orders: Prescription Created Electronically [phone])  Problem # 3:  DEEP VENOUS THROMBOPHLEBITIS, LEG, LEFT (ICD-453.40) Assessment: New  Orders: Doppler Referral (Doppler) suspected DVT  Problem # 4:  GERD (ICD-530.81) Assessment: Improved  Her updated medication list for this problem includes:    Nexium 40 Mg Cpdr (Esomeprazole magnesium) .Marland Kitchen... 1 by mouth once daily.    Robinul-forte 2 Mg Tabs (Glycopyrrolate) ..... One tablet by mouth two times a day  Problem # 5:  BACK PAIN, CHRONIC (ICD-724.5) Assessment: Unchanged  Her updated medication list for this problem includes:    Percocet 10-325 Mg Tabs (Oxycodone-acetaminophen) .Marland Kitchen... 1-2 three times a day-qid as needed    Adult Aspirin Ec Low Strength 81 Mg Tbec (Aspirin) .Marland Kitchen... Take one by mouth as needed  Problem # 6:  PERIPHERAL NEUROPATHY  (ICD-356.9) Assessment: Unchanged  Problem # 7:  ADENOCARCINOMA, BREAST, BILATERAL, HX OF (ICD-V10.3) Assessment: Unchanged  Problem # 8:  HYPERTENSION (ICD-401.9) Assessment: Improved  Her updated medication list for this problem includes:    Diovan Hct 320-25 Mg Tabs (Valsartan-hydrochlorothiazide) .Marland Kitchen... 1 qd    Lasix 40 Mg Tabs (Furosemide) .Marland Kitchen... Take one morning and midafternoon for edema  Complete Medication List: 1)  Nexium 40 Mg Cpdr (Esomeprazole magnesium) .Marland Kitchen.. 1 by mouth once daily. 2)  Pain Pill (name and Dose Unknown)  .... Take two by mouth once daily 3)  Percocet 10-325 Mg Tabs (Oxycodone-acetaminophen) .Marland Kitchen.. 1-2 three times a day-qid as needed 4)  Adult Aspirin Ec Low Strength 81 Mg Tbec (Aspirin) .... Take one by mouth as needed 5)  Levothyroxine Sodium 75 Mcg Tabs (Levothyroxine sodium) .... Once daily 6)  Lorazepam 1 Mg Tabs (Lorazepam) .... 1/2 tablet by mouth at bedtime 7)  Vitamin D (ergocalciferol) 50000 Unit Caps (Ergocalciferol) .... Take one by mouth three times a week 8)  Diovan Hct 320-25 Mg Tabs (Valsartan-hydrochlorothiazide) .Marland Kitchen.. 1 qd 9)  Lasix 40 Mg Tabs (Furosemide) .... Take one morning and midafternoon for edema 10)  Robinul-forte 2 Mg Tabs (Glycopyrrolate) .... One tablet by mouth two times a day 11)  Meclizine Hcl 25 Mg Tabs (Meclizine hcl) .Marland Kitchen.. 1 by mouth three times a day for nauseous and dizziness 12)  Klor-con M20 20 Meq Cr-tabs (Potassium chloride crys cr) .Marland Kitchen.. 1 once daily for potassium 13)  Doxycycline Hyclate 100 Mg Caps (Doxycycline hyclate) .Marland Kitchen.. 1 two times a day for infection  Patient Instructions: 1)  Bilateral edema lower legs with possible deep vein thrombosis on the left  and we'll get a Doppler study to verify either of negative or positive 2)  Area of port appears to be early saline as the left lower leg and will start doxycycline 100 mg b.i.d. 3)  Continued furosemide as instructed, also low salt 4)  Continue analgesics as needed 5)   Continue aspirin daily, regular adult aspirin 6)  We'll call results of studies as soon as I receive the report Prescriptions: DOXYCYCLINE HYCLATE 100 MG CAPS (DOXYCYCLINE HYCLATE) 1 two times a day for infection  #30 x 0   Entered and Authorized by:   Judithann Sheen MD   Signed by:   Judithann Sheen MD on 12/31/2009   Method used:   [facility] to        [facility] [phone][facility] (retail)       [address]       [place], [place]  [address]       Ph: [phone] or [phone]       Fax: [phone]   RxID:   [id number] NEXIUM 40 MG CPDR (ESOMEPRAZOLE MAGNESIUM) 1 by mouth once daily.  #330 x 11   Entered and Authorized by:   Judithann Sheen MD   Signed by:   Judithann Sheen MD on 12/31/2009   Method used:   [facility]ronically to        [facility] [address] [facility] [phone]* (retail)       [address]       [place], [place]  [address]       Ph: [phone] or [phone]       Fax: [phone]   RxID:   [id number] KLOR-CON M20 20 MEQ CR-TABS (POTASSIUM CHLORIDE CRYS CR) 1 once daily for potassium  #30 x 11   Entered and Authorized by:   Judithann Sheen MD   Signed by:   Judithann Sheen MD on 12/31/2009   Method used:   [facility]ically [facility]        [facility] [phone]* (retail)       [address]       [place], [place]  [address]       Ph: [phone] or [phone]       Fax: [phone]   RxID:   [id number] LASIX 40 MG TABS (FUROSEMIDE) take one morning and midafternoon for edema  #60 x 11   Entered and Authorized by:   Judithann Sheen MD   Signed by:   Judithann Sheen MD on 12/31/2009   Method used:   [facility]ically [facility]        [facility] [phone]* (retail)       [address]       [place], [place]  [address]       Ph: [phone] or [phone]       Fax: [phone]   RxID:   [id number] ROBINUL-FORTE 2 MG TABS (GLYCOPYRROLATE) one tablet by mouth two times a day  #60 x 11    Entered and Authorized by:   Judithann Sheen MD   Signed by:   Judithann Sheen MD on 12/31/2009   Method used:   [facility]ronically to        [facility] [phone]* (retail)       [address]       [place], [place]  [address]  Ph: [phone] or [phone]       Fax: [phone]   RxID:   [id number] LEVOTHYROXINE SODIUM 75 MCG  TABS (LEVOTHYROXINE SODIUM) once daily  #30 x 11   Entered and Authorized by:   Judithann Sheen MD   Signed by:   Judithann Sheen MD on 12/31/2009   Method used:   [facility]        [facility] [phone]* (retail)       [address]       [place], [place]  [address]       Ph: [phone] or [phone]       Fax: [phone]   RxID:   [id number]

## 2010-12-31 NOTE — Letter (Signed)
Summary: [facility]  [facility]   Imported By: Sherian Rein 05/15/2010 14:49:49  _____________________________________________________________________  External Attachment:    Type:   Image     Comment:   External Document

## 2010-12-31 NOTE — Progress Notes (Signed)
Summary: med refill  Phone Note Refill Request Message from:  [facility]  Refills Requested: Medication #1:  LORAZEPAM 1 MG  TABS 1/2 tablet by mouth at bedtime  Medication #2:  Compazine for nausea  Medication #3:  DIOVAN HCT 320-25 MG TABS 1 qd Please advise refills? I already refilled the Diovan  Initial call taken by: Heron Sabins,  August 04, 2010 10:43 AM  Follow-up for Phone Call        Lorazepam filled here? If so can fill 1/2 tab by mouth at bedtime, #15, 1 rf, compazine? meclizine? which one does she want? Follow-up by: Danise Edge MD,  August 04, 2010 1:12 PM  Additional Follow-up for Phone Call Additional follow up Details #1::        Informed pt she can pick up RX for Lorazepam and pt wants Meclizine. Please advise how many Meclizine to give pt. Additional Follow-up by: Josph Macho RMA,  August 04, 2010 1:39 PM    Additional Follow-up for Phone Call Additional follow up Details #2::    Meclizine 60 X 1 per MD. Follow-up by: Josph Macho RMA,  August 04, 2010 1:43 PM  Prescriptions: MECLIZINE HCL 25 MG TABS (MECLIZINE HCL) 1 by mouth three times a day for nauseous and dizziness  #60 x 1   Entered by:   Josph Macho RMA   Authorized by:   Danise Edge MD   Signed by:   Josph Macho RMA on 08/04/2010   Method used:   [facility]ed to ...       [facility] [phone]* (retail)       [address]       [place], [place]  [address]       Ph: [phone] or [phone]       Fax: [phone]   RxID:   [id number] LORAZEPAM 1 MG  TABS (LORAZEPAM) 1/2 tablet by mouth at bedtime  #15 x 1   Entered by:   Josph Macho RMA   Authorized by:   Danise Edge MD   Signed by:   Josph Macho RMA on 08/04/2010   Method used:   Print then Give to Patient   RxID:   [id number] MECLIZINE HCL 25 MG TABS (MECLIZINE HCL) 1 by mouth three times a day for nauseous and dizziness  #0 x 0   Entered by:   Josph Macho  RMA   Authorized by:   Danise Edge MD   Signed by:   Josph Macho RMA on 08/04/2010   Method used:   Print then Give to Patient   RxID:   [id number] LORAZEPAM 1 MG  TABS (LORAZEPAM) 1/2 tablet by mouth at bedtime  #15 x 0   Entered by:   Josph Macho RMA   Authorized by:   Danise Edge MD   Signed by:   Josph Macho RMA on 08/04/2010   Method used:   Print then Give to Patient   RxID:   [id number] DIOVAN HCT 320-25 MG TABS (VALSARTAN-HYDROCHLOROTHIAZIDE) 1 qd  #30 x 1   Entered by:   Josph Macho RMA   Authorized by:   Danise Edge MD   Signed by:   Josph Macho RMA on 08/04/2010   Method used:   [facility]ron[facility] to        [facility] [phone]* (retail)       [address]       [place], [place]  [address]       Ph: [phone] or [phone]       Fax: [phone]   RxID:   [id number]  Lorazepam with no refills was shredded. Correct RX printed.

## 2010-12-31 NOTE — Letter (Signed)
Summary: [facility]  [facility]   Imported By: Maryln Gottron 01/07/2010 13:21:58  _____________________________________________________________________  External Attachment:    Type:   Image     Comment:   External Document

## 2010-12-31 NOTE — Letter (Signed)
Summary: [facility]  [facility]   Imported By: Maryln Gottron 03/05/2010 12:35:54  _____________________________________________________________________  External Attachment:    Type:   Image     Comment:   External Document

## 2011-01-05 ENCOUNTER — Encounter: Payer: Self-pay | Admitting: Internal Medicine

## 2011-01-05 ENCOUNTER — Ambulatory Visit (INDEPENDENT_AMBULATORY_CARE_PROVIDER_SITE_OTHER): Payer: PRIVATE HEALTH INSURANCE | Admitting: Internal Medicine

## 2011-01-05 DIAGNOSIS — M67919 Unspecified disorder of synovium and tendon, unspecified shoulder: Secondary | ICD-10-CM

## 2011-01-05 DIAGNOSIS — J069 Acute upper respiratory infection, unspecified: Secondary | ICD-10-CM

## 2011-01-05 NOTE — Progress Notes (Signed)
  Subjective:    Patient ID: Alexa [name], female    DOB: 02-17-1936, 75 y.o.   MRN: [id number]  HPIPt presents to clinic for f/u of recent URI. Seen and placed on doxycycline which she tolerated well. Notes significant improvement of symtpoms. Remains with mild productive cough now without color and improved. Denies f/c, ST, dyspnea or wheezing. Pt postponed rotator cuff surgery due to sx and wishes to be re-evaluated to determine if she can reschedule surgery. No other complaints.    Review of SystemsSee HPI>  Reviewed medications, allergies, PMH     Objective:   Physical Exam  Constitutional: She appears well-developed and well-nourished. No distress.  HENT:  Head: Normocephalic and atraumatic.  Right Ear: External ear normal.  Left Ear: External ear normal.  Nose: Nose normal.  Mouth/Throat: Oropharynx is clear and moist. No oropharyngeal exudate.  Eyes: Conjunctivae are normal. Right eye exhibits no discharge. Left eye exhibits no discharge. Scleral icterus is present.  Neck: Normal range of motion. Neck supple.  Pulmonary/Chest: Effort normal and breath sounds normal. No respiratory distress. She has no wheezes. She has no rales.  Lymphadenopathy:    She has no cervical adenopathy.  Skin: Skin is warm and dry. No rash noted. She is not diaphoretic.          Assessment & Plan:

## 2011-01-05 NOTE — Assessment & Plan Note (Signed)
No contraindication for re-scheduling surgery. Note provided to effect.

## 2011-01-05 NOTE — Assessment & Plan Note (Signed)
Resolving symptoms. No further need for abx past initial course.

## 2011-01-06 NOTE — Assessment & Plan Note (Signed)
Summary: CONGESTION // RS   Vital Signs:  Patient profile:   75 year old female Weight:      209 pounds BMI:     36.00 Pulse rate:   88 / minute BP sitting:   152 / 80  (right arm)  Vitals Entered By: Kyung Rudd, CMA (December 28, 2010 11:22 AM) CC: pt c/o cough and chest congestion since Friday   Primary Care Provider:  Rickard Patience, MD  CC:  pt c/o cough and chest congestion since Friday.  History of Present Illness: Patient presents to clinic as a workin for evaluation of cough and ST. Notes 4d h/o ST, cough intermittently productive for white sputum and nasal congestion. Denies f/c, dyspnea or wheezing. Self-initiated doxycycline abx x 3days. Was scheduled for rotator cuff surgery today but this was canceled due to infxn. No alleviating or exacerbating factors. No sick exposures.  Current Medications (verified): 1)  Nexium 40 Mg Cpdr (Esomeprazole Magnesium) .Marland Kitchen.. 1 By Mouth Once Daily. 2)  Pain Pill (Name and Dose Unknown) .... Take Two By Mouth Once Daily 3)  Percocet 10-325 Mg  Tabs (Oxycodone-Acetaminophen) .Marland Kitchen.. 1-2 Three Times A Day-Qid As Needed 4)  Levothyroxine Sodium 75 Mcg  Tabs (Levothyroxine Sodium) .... Once Daily 5)  Diovan Hct 320-25 Mg Tabs (Valsartan-Hydrochlorothiazide) .Marland Kitchen.. 1 Qd 6)  Lasix 40 Mg Tabs (Furosemide) .... Take One Morning and Midafternoon For Edema 7)  Klor-Con M20 20 Meq Cr-Tabs (Potassium Chloride Crys Cr) .Marland Kitchen.. 1 Once Daily For Potassium 8)  Morphine Sulfate 30 Mg Tabs (Morphine Sulfate) .Marland Kitchen.. 1 Tab Three Times A Day  Allergies (verified): 1)  ! Naprosyn (Naproxen) and Ther Nsaids (Naproxen) 2)  ! Penicillin 3)  Hydrocodone-Homatropine (Hydrocodone-Homatropine) 4)  Simvastatin (Simvastatin)  Past History:  Past medical, surgical, family and social histories (including risk factors) reviewed for relevance to current acute and chronic problems.  Past Medical History: Reviewed history from 09/01/2009 and no changes  required. Hyperlipidemia Hypertension Chronic pain management B/L Breast cancer Adenomatous Colon Polyps 04/1998 GERD Diverticulosis  Past Surgical History: Reviewed history from 09/01/2009 and no changes required. Cholecystectomy 2007 B/L Mastectomies 2002 Hysterectomy Lumbar laminectomy Rotator cuff repair cervical spine surgery x 2 last  one last year Dr Lovell Sheehan..   Hip surgery  Family History: Reviewed history from 09/01/2009 and no changes required. Family History of Heart Disease: Father, Brother, Sister No FH of Colon Cancer: Family History of Colon Polyps:daughter Family History of Diabetes: daughter X105  Social History: Reviewed history from 09/01/2009 and no changes required. widowed Former Smoker Alcohol Use - no Daily Caffeine Use Illicit Drug Use - no Patient gets regular exercise.  Review of Systems      See HPI  Physical Exam  General:  Well-developed,well-nourished,in no acute distress; alert,appropriate and cooperative throughout examination Head:  Normocephalic and atraumatic without obvious abnormalities. No apparent alopecia or balding. Eyes:  pupils equal, pupils round, corneas and lenses clear, and no injection.   Ears:  External ear exam shows no significant lesions or deformities.  Otoscopic examination reveals clear canals, tympanic membranes are intact bilaterally without bulging, retraction, inflammation or discharge. Hearing is grossly normal bilaterally. Nose:  External nasal examination shows no deformity or inflammation. Nasal mucosa are pink and moist without lesions or exudates. Mouth:  no exudates, no posterior lymphoid hypertrophy, no postnasal drip, and no lesions.  +posterior erythema Neck:  No deformities, masses, or tenderness noted. Lungs:  Normal respiratory effort, chest expands symmetrically. Lungs are clear to auscultation, no crackles or wheezes.  Heart:  Normal rate and regular rhythm. S1 and S2 normal without gallop, murmur,  click, rub or other extra sounds. Skin:  Intact without suspicious lesions or rashes Cervical Nodes:  No lymphadenopathy noted   Impression & Recommendations:  Problem # 1:  URI (ICD-465.9) Assessment New Rapid strep neg. May represent viral infxn however cannot exclude bacterial etiology and surgery is being postponed. Begin doxycycline x7d. Followup if no improvement or worsening.  The following medications were removed from the medication list:    Adult Aspirin Ec Low Strength 81 Mg Tbec (Aspirin) .Marland Kitchen... Take one by mouth as needed  Complete Medication List: 1)  Nexium 40 Mg Cpdr (Esomeprazole magnesium) .Marland Kitchen.. 1 by mouth once daily. 2)  Pain Pill (name and Dose Unknown)  .... Take two by mouth once daily 3)  Percocet 10-325 Mg Tabs (Oxycodone-acetaminophen) .Marland Kitchen.. 1-2 three times a day-qid as needed 4)  Levothyroxine Sodium 75 Mcg Tabs (Levothyroxine sodium) .... Once daily 5)  Diovan Hct 320-25 Mg Tabs (Valsartan-hydrochlorothiazide) .Marland Kitchen.. 1 qd 6)  Lasix 40 Mg Tabs (Furosemide) .... Take one morning and midafternoon for edema 7)  Klor-con M20 20 Meq Cr-tabs (Potassium chloride crys cr) .Marland Kitchen.. 1 once daily for potassium 8)  Morphine Sulfate 30 Mg Tabs (Morphine sulfate) .Marland Kitchen.. 1 tab three times a day 9)  Doxycycline Hyclate 100 Mg Tabs (Doxycycline hyclate) .... One by mouth bid  Other Orders: Rapid Strep ([address]) Prescriptions: DOXYCYCLINE HYCLATE 100 MG TABS (DOXYCYCLINE HYCLATE) one by mouth bid  #14 x 0   Entered and Authorized by:   Edwyna Perfect MD   Signed by:   Edwyna Perfect MD on 12/28/2010   Method used:   [facility] to        [facility] [phone]* (retail)       [address]       [place], [place]  [address]       Ph: [phone] or [phone]       Fax: [phone]   RxID:   [id number]    Orders Added: 1)  Rapid Strep [03474] 2)  Est. Patient Level III [[address]]    Laboratory Results    Other Tests  Rapid Strep: negative

## 2011-01-06 NOTE — Letter (Signed)
Summary: [facility]  [facility]   Imported By: Maryln Gottron 01/01/2011 09:31:39  _____________________________________________________________________  External Attachment:    Type:   Image     Comment:   External Document

## 2011-01-22 ENCOUNTER — Emergency Department (HOSPITAL_COMMUNITY)
Admission: EM | Admit: 2011-01-22 | Discharge: 2011-01-23 | Disposition: A | Discharge status: Home / Self Care | Payer: Medicare Other | Source: Home / Self Care | Attending: Emergency Medicine | Admitting: Emergency Medicine

## 2011-01-22 DIAGNOSIS — E039 Hypothyroidism, unspecified: Secondary | ICD-10-CM | POA: Insufficient documentation

## 2011-01-22 DIAGNOSIS — M79609 Pain in unspecified limb: Secondary | ICD-10-CM | POA: Insufficient documentation

## 2011-01-22 DIAGNOSIS — L02419 Cutaneous abscess of limb, unspecified: Secondary | ICD-10-CM | POA: Insufficient documentation

## 2011-01-22 DIAGNOSIS — I8390 Asymptomatic varicose veins of unspecified lower extremity: Secondary | ICD-10-CM | POA: Insufficient documentation

## 2011-01-22 DIAGNOSIS — I10 Essential (primary) hypertension: Secondary | ICD-10-CM | POA: Insufficient documentation

## 2011-01-23 ENCOUNTER — Emergency Department (HOSPITAL_COMMUNITY)
Admission: EM | Admit: 2011-01-23 | Discharge: 2011-01-23 | Disposition: A | Discharge status: Home / Self Care | Payer: PRIVATE HEALTH INSURANCE | Attending: Family Medicine | Admitting: Family Medicine

## 2011-01-23 ENCOUNTER — Ambulatory Visit (HOSPITAL_COMMUNITY)
Admission: EM | Admit: 2011-01-23 | Discharge: 2011-01-23 | Disposition: A | Discharge status: Home / Self Care | Payer: Medicare Other | Source: Ambulatory Visit | Attending: Emergency Medicine | Admitting: Emergency Medicine

## 2011-01-23 ENCOUNTER — Encounter (HOSPITAL_COMMUNITY): Payer: PRIVATE HEALTH INSURANCE

## 2011-01-23 DIAGNOSIS — L03119 Cellulitis of unspecified part of limb: Secondary | ICD-10-CM | POA: Insufficient documentation

## 2011-01-23 DIAGNOSIS — I8390 Asymptomatic varicose veins of unspecified lower extremity: Secondary | ICD-10-CM | POA: Insufficient documentation

## 2011-01-23 DIAGNOSIS — I10 Essential (primary) hypertension: Secondary | ICD-10-CM | POA: Insufficient documentation

## 2011-01-23 DIAGNOSIS — L02419 Cutaneous abscess of limb, unspecified: Secondary | ICD-10-CM | POA: Insufficient documentation

## 2011-01-23 DIAGNOSIS — M79609 Pain in unspecified limb: Secondary | ICD-10-CM | POA: Insufficient documentation

## 2011-01-23 DIAGNOSIS — M7989 Other specified soft tissue disorders: Secondary | ICD-10-CM | POA: Insufficient documentation

## 2011-01-23 DIAGNOSIS — E039 Hypothyroidism, unspecified: Secondary | ICD-10-CM | POA: Insufficient documentation

## 2011-01-25 ENCOUNTER — Ambulatory Visit (INDEPENDENT_AMBULATORY_CARE_PROVIDER_SITE_OTHER): Payer: Medicare Other | Admitting: Family Medicine

## 2011-01-25 ENCOUNTER — Encounter: Payer: Self-pay | Admitting: Family Medicine

## 2011-01-25 VITALS — BP 102/76 | HR 85 | Temp 98.6°F | Resp 12 | Wt 210.0 lb

## 2011-01-25 DIAGNOSIS — I8000 Phlebitis and thrombophlebitis of superficial vessels of unspecified lower extremity: Secondary | ICD-10-CM

## 2011-01-25 DIAGNOSIS — L0390 Cellulitis, unspecified: Secondary | ICD-10-CM

## 2011-01-25 DIAGNOSIS — I809 Phlebitis and thrombophlebitis of unspecified site: Secondary | ICD-10-CM

## 2011-01-25 NOTE — Progress Notes (Signed)
  Subjective:    Patient ID: Alexa [name], female    DOB: 1936-01-04, 75 y.o.   MRN: [id number]  HPI Here to follow up an ER visit on 01-23-11 for a superficial venous thrombus with cellulitis in the right calf. A doppler that night ruled out a DVT. She is keeping the leg elevated. She was given a shot of Rocephin and placed on 7 days of bBactrim DS. Todat she fels a little better with mild pain in the leg only. No SOB or chest pain.    Review of Systems  Constitutional: Negative.   Respiratory: Negative.   Cardiovascular: Positive for leg swelling. Negative for chest pain and palpitations.       Objective:   Physical Exam  Cardiovascular: Normal rate, regular rhythm, normal heart sounds and intact distal pulses.  Exam reveals no gallop and no friction rub.   No murmur heard. Pulmonary/Chest: Effort normal and breath sounds normal. No respiratory distress. She has no wheezes. She has no rales. She exhibits no tenderness.  Musculoskeletal:       The right calf is swollen, red, warm, and mildly tender          Assessment & Plan:  Stay off your feet, Continue the Bactrim. See Dr. Scotty Court in one week.

## 2011-02-03 ENCOUNTER — Encounter: Payer: Self-pay | Admitting: Family Medicine

## 2011-02-03 ENCOUNTER — Ambulatory Visit (INDEPENDENT_AMBULATORY_CARE_PROVIDER_SITE_OTHER): Payer: Medicare Other | Admitting: Family Medicine

## 2011-02-03 VITALS — BP 140/80 | HR 77 | Temp 97.8°F | Wt 208.0 lb

## 2011-02-03 DIAGNOSIS — E039 Hypothyroidism, unspecified: Secondary | ICD-10-CM

## 2011-02-03 DIAGNOSIS — S8010XA Contusion of unspecified lower leg, initial encounter: Secondary | ICD-10-CM

## 2011-02-03 DIAGNOSIS — G8929 Other chronic pain: Secondary | ICD-10-CM

## 2011-02-03 DIAGNOSIS — I10 Essential (primary) hypertension: Secondary | ICD-10-CM

## 2011-02-03 DIAGNOSIS — T148XXA Other injury of unspecified body region, initial encounter: Secondary | ICD-10-CM

## 2011-02-03 DIAGNOSIS — M549 Dorsalgia, unspecified: Secondary | ICD-10-CM

## 2011-02-03 DIAGNOSIS — Z853 Personal history of malignant neoplasm of breast: Secondary | ICD-10-CM

## 2011-02-03 LAB — TSH: TSH: 4.31 u[IU]/mL (ref 0.35–5.50)

## 2011-02-03 MED ORDER — LEVOTHYROXINE SODIUM 75 MCG PO TABS: 75.0000 ug | ORAL_TABLET | Freq: Every day | ORAL | Status: DC

## 2011-02-03 MED ORDER — VALSARTAN-HYDROCHLOROTHIAZIDE 320-25 MG PO TABS: 1.0000 | ORAL_TABLET | Freq: Every day | ORAL | Status: DC

## 2011-02-09 ENCOUNTER — Ambulatory Visit: Payer: PRIVATE HEALTH INSURANCE | Admitting: Family Medicine

## 2011-02-09 ENCOUNTER — Other Ambulatory Visit: Payer: Self-pay

## 2011-02-09 ENCOUNTER — Telehealth: Payer: Self-pay | Admitting: Family Medicine

## 2011-02-09 MED ORDER — LEVOTHYROXINE SODIUM 100 MCG PO TABS: 100.0000 ug | ORAL_TABLET | Freq: Every day | ORAL | Status: DC

## 2011-02-09 NOTE — Telephone Encounter (Signed)
Pt aware tsh was elevated from 2.4 to 4.3 and that synthroid 100 mcg would be called in to pharmacy. Pt instructed per dr. Scotty Court to mix synthroid 75 and 100 and alternate

## 2011-02-10 ENCOUNTER — Encounter: Payer: Self-pay | Admitting: Family Medicine

## 2011-02-10 ENCOUNTER — Other Ambulatory Visit: Payer: Self-pay | Admitting: Specialist

## 2011-02-10 ENCOUNTER — Encounter (HOSPITAL_COMMUNITY): Payer: Medicare Other | Attending: Specialist

## 2011-02-10 DIAGNOSIS — Z01818 Encounter for other preprocedural examination: Secondary | ICD-10-CM | POA: Insufficient documentation

## 2011-02-10 LAB — BASIC METABOLIC PANEL
CO2: 30 mEq/L (ref 19–32)
Calcium: 9.2 mg/dL (ref 8.4–10.5)
Chloride: 99 mEq/L (ref 96–112)
GFR calc Af Amer: >60 mL/min (ref >60)
Sodium: 139 mEq/L (ref 135–145)

## 2011-02-10 LAB — COMPREHENSIVE METABOLIC PANEL
ALT: 19 U/L (ref 0–35)
Alkaline Phosphatase: 88 U/L (ref 39–117)
CO2: 30 mEq/L (ref 19–32)
Calcium: 9.5 mg/dL (ref 8.4–10.5)
GFR calc non Af Amer: >60 mL/min (ref >60)
Glucose, Bld: 93 mg/dL (ref 70–99)
Sodium: 138 mEq/L (ref 135–145)

## 2011-02-10 LAB — CBC
HCT: 41.6 % (ref 36.0–46.0)
Hemoglobin: 12.8 g/dL (ref 12.0–15.0)
Hemoglobin: 13.8 g/dL (ref 12.0–15.0)
MCHC: 32.2 g/dL (ref 30.0–36.0)
MCHC: 33.2 g/dL (ref 30.0–36.0)
Platelets: 227 10*3/uL (ref 150–400)
RBC: 4.54 MIL/uL (ref 3.87–5.11)

## 2011-02-10 LAB — SURGICAL PCR SCREEN
MRSA, PCR: NEGATIVE
Staphylococcus aureus: NEGATIVE
Staphylococcus aureus: NEGATIVE

## 2011-02-10 LAB — AFB CULTURE WITH SMEAR (NOT AT ARMC): Acid Fast Smear: NONE SEEN

## 2011-02-10 LAB — CULTURE, RESPIRATORY W GRAM STAIN: Culture: NO GROWTH

## 2011-02-10 LAB — FUNGUS CULTURE W SMEAR

## 2011-02-10 LAB — PROTIME-INR: Prothrombin Time: 12.2 seconds (ref 11.6–15.2)

## 2011-02-10 NOTE — Progress Notes (Signed)
  Subjective:    Patient ID: Alexa [name], female    DOB: 1936/09/19, 75 y.o.   MRN: [id number] this 75 year old white well with same in the office by Dr. Clent Ridges and later in the emergency room for hospital blood clot in the right leg was found to have superficial hematoma and came in today for this to be reevaluated regarding infection and reevaluate DVT she relates she had rotator cuff surgery 23 March by Dr. Thermon Leyland is doing her well. Shto evaluate possible e is was seen by Dr. early in [place] evaluate edema of legs and was treated with supportive stockings In today to repeat evaluation of hypothyroidism,patient has chronic back pain and had laminectomy lumbar spine as well as cervical spine and also had surgery on the left hip, history of bilateral breast carcinoma, ulcer history of peripheral neuropathy   Review of SystemsC. History of present illness    Objective:   Physical Examthe patient is well-developed well-nourished patient who appears to be in no distress Examination heart lung negative, peripheral edema under control There is evidence of a previous contusion with hematoma of the right oral leg but no evidence of any problem no infection       Assessment & Plan:  At this time I feel the patient's medical problems were under control and she has little worried her any concern over the right lower leg. 2 obtain TS H. Test today continue other medication as previously prescribed

## 2011-02-19 ENCOUNTER — Observation Stay (HOSPITAL_COMMUNITY)
Admission: RE | Admit: 2011-02-19 | Discharge: 2011-02-20 | Disposition: A | Discharge status: Home / Self Care | Payer: Medicare Other | Source: Ambulatory Visit | Attending: Specialist | Admitting: Specialist

## 2011-02-19 DIAGNOSIS — M25819 Other specified joint disorders, unspecified shoulder: Secondary | ICD-10-CM | POA: Insufficient documentation

## 2011-02-19 DIAGNOSIS — M7500 Adhesive capsulitis of unspecified shoulder: Secondary | ICD-10-CM | POA: Insufficient documentation

## 2011-02-19 DIAGNOSIS — M67919 Unspecified disorder of synovium and tendon, unspecified shoulder: Principal | ICD-10-CM | POA: Insufficient documentation

## 2011-02-19 DIAGNOSIS — I10 Essential (primary) hypertension: Secondary | ICD-10-CM | POA: Insufficient documentation

## 2011-02-19 DIAGNOSIS — Z981 Arthrodesis status: Secondary | ICD-10-CM | POA: Insufficient documentation

## 2011-02-19 DIAGNOSIS — Z01811 Encounter for preprocedural respiratory examination: Secondary | ICD-10-CM | POA: Insufficient documentation

## 2011-02-19 DIAGNOSIS — M719 Bursopathy, unspecified: Principal | ICD-10-CM | POA: Insufficient documentation

## 2011-02-19 LAB — GLUCOSE, CAPILLARY: Glucose-Capillary: 111 mg/dL — ABNORMAL HIGH (ref 70–99)

## 2011-02-25 NOTE — Op Note (Signed)
NAME:  Alexa [name], Alexa [name]              ACCOUNT NO.:  [id number]  MEDICAL RECORD NO.:  [id number]           PATIENT TYPE:  I  LOCATION:  1618                         FACILITY:  [facility]  PHYSICIAN:  Jene Every, M.D.    DATE OF BIRTH:  1936-07-07  DATE OF PROCEDURE:  02/19/2011 DATE OF DISCHARGE:                              OPERATIVE REPORT   PREOPERATIVE DIAGNOSES:  Recurrent rotator cuff tear, impingement syndrome, and adhesive capsulitis, right shoulder.  POSTOPERATIVE DIAGNOSES:  Recurrent rotator cuff tear, impingement syndrome, and adhesive capsulitis, right shoulder.  PROCEDURE PERFORMED:  Redo open rotator cuff repair mini with lysis of adhesions and subacromial decompression.  ANESTHESIA:  General.  SURGEON:  Jene Every, MD  ASSISTANT:  Roma Schanz, P.A.  BRIEF HISTORY:  This is a 75 year old with recurrent tear, refractory to conservative treatment, indicated for repair.  She had a tear of the rotator cuff in the past.  Risks and benefits discussed including bleeding, infection, no change in symptoms, worsening symptoms, need for repeat debridement, DVT, PE, anesthetic complications, etc.  TECHNIQUE:  With the patient in supine beach-chair position after induction of adequate general anesthesia, 2 g of Kefzol, right shoulder and upper extremity was prepped and draped in usual sterile fashion. Surgical marker was utilized to delineate acromion, AC joint, coracoid. A small incision in the anterolateral aspect of the acromion 2 cm in length.  Subcutaneous tissue was dissected.  Electrocautery was utilized to achieve hemostasis.  Raphe between the anterolateral heads was identified, divided in line with skin incision.  Subperiosteal was elevated from the anterolateral and anteromedial aspect of the acromion preserving the deltoid attachment.  Previous surgical sutures were removed.  Copious portion of clear synovial fluid was evacuated and the wound was  copiously irrigated.  A split tear of the supraspinatus was noted.  This was debrided.  The glenohumeral joint was lavaged.  Removed a small spur of the anterolateral aspect of the acromion, then repaired the supraspinatus and the infraspinatus with #1 Vicryl interrupted figure-of-8 sutures from the glenoid to the greater tuberosity side-to- side and at the greater tuberosity, there was attachment of the supraspinatus and infraspinatus laterally that I felt with better fixation, it would be better to place a  Mitek suture anchor at the foot plate, which I repaired with a Matt Holmes rongeur to good bleeding cancellous tissue, placed a Mitek suture anchor into the bone with excellent pullout resistance, threaded that through the supraspinatus tendon and delivered it into the bony trough with a good surgical knot.  Following that repair, there was watertight closure.  Prior to that however, we spent considerable time lysing the adhesions in the subacromial space both digitally and with a cup.  We had to divide the significant adhesions in the subacromial bursa as well.  She had a hypertrophic bursa, which was excised as well.  After the repair, she had good range. Remainder of the tendon although degenerated was intact.  We copiously irrigated and then repaired the raphe with #1 Vicryl interrupted figure- of-8 sutures over top and through the acromion, subcu with #0 and #2 Vicryl simple sutures, skin was reapproximated with  4-0 subcuticular Prolene.  Sterile dressing applied, placed in an abduction pillow, extubated without difficulty, and transported to recovery room in satisfactory condition.  The patient tolerated the procedure well.  No complications.     Jene Every, M.D.     [name]  D:  02/19/2011  T:  02/20/2011  Job:  [id number]  Electronically Signed by Jene Every M.D. on 02/25/2011 07:23:25 AM

## 2011-03-16 ENCOUNTER — Encounter (HOSPITAL_BASED_OUTPATIENT_CLINIC_OR_DEPARTMENT_OTHER): Payer: Medicare Other | Admitting: Oncology

## 2011-03-16 DIAGNOSIS — Z170 Estrogen receptor positive status [ER+]: Secondary | ICD-10-CM

## 2011-03-16 DIAGNOSIS — C50519 Malignant neoplasm of lower-outer quadrant of unspecified female breast: Secondary | ICD-10-CM

## 2011-03-30 ENCOUNTER — Other Ambulatory Visit: Payer: Self-pay | Admitting: Cardiothoracic Surgery

## 2011-03-30 DIAGNOSIS — R911 Solitary pulmonary nodule: Secondary | ICD-10-CM

## 2011-04-13 NOTE — Consult Note (Signed)
NEW PATIENT CONSULTATION   Nifong, Mikhia A  DOB:  August 04, 1936                                       12/02/2010  ZOXWR#:[id number]   The patient presents today for evaluation of lower extremity pathology.  She is an otherwise healthy 75 year old white female with a long history  of venous pathology.  She is status post ligation and stripping of her  right great saphenous vein by Dr. Sharman Cheek, approximately 30 years  ago.  She has had progressive difficulty with swelling in both lower  extremities, mostly from her knees distally.  This has become more  pronounced with prolonged standing.  She does have extensive  telangiectasia and tributary varicosities bilaterally as well.  She has  been wearing compression garments and reports that knee high compression  garments provide some relief with this.  This has decreased the swelling  of her knee.  She has not had bleeding from the varicosities.  She does  have chronic pain syndrome after having multiple prior back surgeries.  She does have history of mastectomy, rotator cuff surgery,  cholecystectomy, and hip surgery.   MEDICAL HISTORY:  Positive for hypertension, elevated cholesterol.  She  does not have any history of cardiac disease.   SOCIAL HISTORY:  She is widowed with 3 children.  She does not smoke  having quit in 1984 and does not drink alcohol.   FAMILY HISTORY:  Significant for premature heart disease in her mother.   REVIEW OF SYSTEMS:  Positive for weight gain.  She weighs 202 pounds.  She is 5 feet 4 inches tall.  VASCULAR:  Positive for pain in legs with standing.  GI:  Positive for reflux and constipation.  MUSCULOSKELETAL:  Positive for arthritis, joint pain, muscle pain.  Review of systems otherwise negative except for HPI.   PHYSICAL EXAM:  Well-nourished white female appearing stated age of 55,  in no acute stress.  Blood pressure 130/69, pulse 71, respirations 16.  HEENT is normal.  Her  abdomen is soft, nontender.  Musculoskeletal shows  no major cyanosis.  Neurologic:  No focal paresthesias.  Skin:  She does  have a birthmark which did not appear to be extensive AV malformation.  This is very superficial extends over her right lateral posterior calf  and thigh.  She does have marked telangiectasia in both lower  extremities and also tributary varicosities.   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  She  does have tortuous tributary varicosities around the anterior thigh.  The left leg is positive for reflux in her left great saphenous vein.  She also has severe reflux in her common femoral vein and femoral vein  bilaterally.   I had a long discussion with the patient's and her daughter present.  I  explained that I did not anything that was dangerous or limb threatening  with her lower extremity venous pathology.  I explained that with her  deep reflux that there really is not any effective treatment other than  continued compression garments and elevation when possible.  I explained  that she would be a candidate for treatment of her refluxing left great  saphenous vein but feel that this would make minimal impact on her  symptoms due to the reflux in her  deep system.  She understands and will  continue her elevation and compression, and see [place] if she develops any  worsening problems.     Larina Earthly, M.D.  Electronically Signed   TFE/MEDQ  D:  12/02/2010  T:  12/03/2010  Job:  [id number]   cc:   Ellin Saba., MD

## 2011-04-13 NOTE — Discharge Summary (Signed)
NAME:  [name], Alexa              ACCOUNT NO.:  [id number]   MEDICAL RECORD NO.:  [id number]          PATIENT TYPE:  IPS   LOCATION:  4007                         FACILITY:  [facility]   PHYSICIAN:  Ranelle Oyster, M.D.DATE OF BIRTH:  02-25-1936   DATE OF ADMISSION:  09/20/2008  DATE OF DISCHARGE:  09/28/2008                               DISCHARGE SUMMARY   DISCHARGE DIAGNOSES:  1. Lumbar L1-2 decompressive laminectomy secondary to degenerative      disk disease, stenosis, and radiculopathy, September 16, 2008.  2. History of multiple back surgeries.  3. Pain management.  4. Hypertension.  5. Hypothyroidism.  6. Anxiety.  7. Right calf wound, improving.   This is a 75 year old white female with history of lumbar back surgery  x3 and anterior cervical disk fusion x1 who was admitted on September 16, 2008, with chronic low back pain radiating to the lower extremities.  X-  rays and imaging showed lumbar L1-2 degenerative disk disease, stenosis,  and radiculopathy.  Underwent decompressive laminectomy on September 16, 2008, by Alexa [name].  Pain control with Percocet.  Back brace when out  of bed.  Follow up with wound care nurse for right calf wound after an  abrasion when she hit her leg on a metal drawer at home.  Aqua-Gel was  placed to absorb drainage.  MS Contin 30 mg twice daily was added on  September 17, 2008, for pain management.  She remained on intravenous  Kefzol on September 19, 2008, for the right lower extremity wound.  She  was admitted for comprehensive rehab program.   PAST MEDICAL HISTORY:  See discharge diagnoses.  Remote smoker.  No  alcohol.   ALLERGIES:  NONSTEROIDAL ANTI-INFLAMMATORIES, COX - 2 INHIBITORS, and  STRAWBERRIES.   SOCIAL HISTORY:  She lives with her daughter and assistance as needed.  A second daughter in area can also assist.  They live in a 1-level home  with 3 steps to entry.   FUNCTIONAL HISTORY:  Prior to admission was independent and  active.  Functional status upon admission to rehab services was minimal assist  for mobility.   MEDICATIONS PRIOR TO ADMISSION:  1. Nexium 40 mg daily.  2. Potassium chloride 20 mEq daily.  3. Lasix 40 mg daily.  4. Percocet as needed.  5. Diovan with hydrochlorothiazide 160/25 mg daily.  6. Ativan at bedtime.  7. Synthroid 75 mcg daily.   PHYSICAL EXAMINATION:  VITAL SIGNS:  Blood pressure 128, diastolic 70;  pulse 80; temperature 98; respirations 18.  GENERAL:  This was an alert female, in no acute distress, oriented x3.  EXTREMITIES:  Deep tendon reflexes were 2+.  Calves remained cool  without any swelling or erythema.  Nontender.  Right lower extremity  wound with Aqua-Gel in place.  BACK:  Incision was clean and dry.  LUNGS:  Clear to auscultation.  CARDIAC:  Regular rate and rhythm.  ABDOMEN:  Soft and nontender.  Good bowel sounds.   REHABILITATION AND HOSPITAL COURSE:  The patient was admitted to  inpatient rehab services with therapies initiated on a 3-hour daily  basis, consisting of physical therapy, occupational therapy, and  rehabilitation nursing.  The following issues were addressed during the  patient's rehabilitation stay.  Pertaining to [name]'s lumbar L1-2  decompressive laminectomy, surgical site healing nicely.  No signs of  infection.  She was followed by Alexa [name].  She was wearing a  back brace when out of bed.  Pain management ongoing with the use of MS  Contin, which was increased to 45 mg on September 25, 2008, and monitored  closely.  She was also using oxycodone for breakthrough pain.  Noted she  did have a right lower extremity calf wound, which she got when she hit  a metal drawer at home.  WC nurse followup dressing changes, change from  Aqua-Gel to simply Neosporin dry dressing to the right leg.  She was  finishing a course of Keflex, changed from IV Kefzol for a wound care.  Blood pressures monitored with hydrochlorothiazide,  Benicar, and Lasix.  She did have some mild orthostatic changes.  Her hydrochlorothiazide was  held with followup chemistries pending.  She remained on her hormone  supplement for hypothyroidism.   Overall for her functional status, she was now supervision to minimal  assist for overall mobility, supervision for activities of daily living  except needing some assist for lower body dressing.  She was continent  of bowel and bladder with routine toileting by rehab nursing, although  she did have some mild constipation for which she did receive laxative  assistance.   Latest labs showed hemoglobin 11.1, hematocrit 31.9, and platelet  332,000.  Sodium 136, potassium 3.2, BUN 21, and creatinine 1.0.  Followup chemistries pending.  The patient received weekly  interdisciplinary team conferences to establish estimated length of  stays to collaborate on family teaching, as well as any barriers to  discharge, and she was discharged to home.   Discharge medications at time of dictation included:  1. Benicar 20 mg daily.  2. Ativan 1 mg at bedtime.  3. Synthroid 75 mcg daily.  4. Protonix 40 mg daily.  5. Lasix 40 mg daily.  6. Potassium chloride 20 mEq daily.  7. Keflex 500 mg 4 times daily x3 more days and stop.  8. Morphine sulfate 45 mg twice daily.  9. Oxycodone immediate release 5 mg 1-2 tablets every 4 hours as      needed for pain, dispense 90 tablets.   Diet was regular.   Special precaution was back brace when out of bed.  She should follow up  with Alexa [name], [phone], Neurosurgery, call for appointment;  Alexa [name] at the outpatient rehab service office [phone] as  needed; Alexa [name] at medical management.      Alexa [name], P.A.      Ranelle Oyster, M.D.  Electronically Signed    DA/MEDQ  D:  09/26/2008  T:  09/26/2008  Job:  [id number]   cc:   Alexa [name]., MD  Alexa [name], M.D.

## 2011-04-13 NOTE — Op Note (Signed)
NAME:  Alexa [name], Alexa [name]              ACCOUNT NO.:  [id number]   MEDICAL RECORD NO.:  [id number]          PATIENT TYPE:  AMB   LOCATION:  DAY                          [facility]  [facility]   PHYSICIAN:  Ollen Gross, M.D.    DATE OF BIRTH:  [birth year]   DATE OF PROCEDURE:  01/09/2008  DATE OF DISCHARGE:  01/09/2008                               OPERATIVE REPORT   PREOPERATIVE DIAGNOSIS:  Left hip intractable bursitis and gluteal  tendon tear.   POSTOPERATIVE DIAGNOSIS:  Left hip intractable bursitis and gluteal  tendon tear.   PROCEDURE:  Left hip bursectomy with gluteal tendon repair.   SURGEON:  Ollen Gross, M.D.   ASSISTANT:  Avel Peace PA-C   ANESTHESIA:  General.   BLOOD LOSS:  Minimal.   DRAINS:  None.   COMPLICATIONS:  None.   CONDITION:  Stable to recovery.   CLINICAL NOTE:  Alexa [name] is a 75 year old female with intractable  left lateral hip pain.  She has had extensive nonoperative management  and MRI showed probable gluteal tendon tear in gluteus medius.  She  presents now for bursectomy and tendon repair given failure of  nonoperative management.   PROCEDURE IN DETAIL:  After successful administration of general  anesthetic, the patient was placed in a right lateral decubitus position  with the left side up and held with the hip positioner.  The left lower  extremity was isolated from perineum with plastic drapes and prepped and  draped in usual sterile fashion.  Longitudinal incision was made  centered at the tip of the trochanter coursing about 5 cm in length.  Skin cut with 10 blade through subcutaneous tissue which was a very  thick layer down to the fascia lata.  The fascia lata was incised in  line with the skin incision.  There is thickened bursa underlying the  fascia lata.  This was excised and all bleeding points cauterized.  I  palpated the short external rotators, no excess tightness especially in  the piriformis.  The gluteus medius is  inspected and there is no gross  tear but upon palpating on the tip of the trochanter, there is a defect  just below the superficial layer.  I made a longitudinal split in the  tendon and indeed there was bursal fluid present in there and evidence  of attritional wear and tear of part of the gluteus medius tendon.  I  debrided back the abnormal-appearing tissue and placed a Mitek anchor  into the tip of the greater trochanter.  The sutures were then passed  through the edges of debrided tendon and is advanced down to the  trochanter and tied and oversewn.  I used the remainder of the Ethibond  suture to oversew the split in the gluteus medius.  I then palpated and  there was no defect palpable.  I inspected further and there is no  evidence of any additional bursal tissue or any other tears.  The wound  was then copiously irrigated with saline solution and meticulous  hemostasis is achieved.  I left open a small triangular area in the  fascia lata so it would not rub over the tip of the trochanter and then  we closed the remainder of the fascia lata with interrupted #1 Vicryl.  30 mL of 4% Marcaine with epi injected into the subcu tissues and into  the gluteus maximus and into the fascia lata.  Subcu is then closed with  interrupted #1 and interrupted 2-0 Vicryl and subcuticular running 4-0  Monocryl.  Incisions cleaned and dried and Steri-Strips and bulky  sterile dressing applied.  She is awakened and transported to recovery  in stable condition.      Ollen Gross, M.D.  Electronically Signed     FA/MEDQ  D:  01/09/2008  T:  01/11/2008  Job:  [id number]

## 2011-04-13 NOTE — H&P (Signed)
NAMEJAKAILA, NORMENT              ACCOUNT NO.:  [id number]   MEDICAL RECORD NO.:  [id number]          PATIENT TYPE:  IPS   LOCATION:  NA                           FACILITY:  [facility]   PHYSICIAN:  Ellwood Dense, M.D.   DATE OF BIRTH:  1936-09-27   DATE OF ADMISSION:  09/16/2008  DATE OF DISCHARGE:                              HISTORY & PHYSICAL   ADMITTING PHYSICIAN:  Ranelle Oyster, MD   PRIMARY CARE PHYSICIAN:  Tawny Asal, MD   NEUROSURGEON:  [name], MD   HISTORY OF PRESENT ILLNESS:  Ms. Dupriest is a 75 year old Caucasian  female with history of lumbar back surgery x3 with anterior cervical  diskectomy times one.   The patient was admitted September 16, 2008, with chronic low back pain  with radiating symptoms to her lower extremities.  X-ray and imaging had  shown L1-2 degenerative disk disease/stenosis/radiculopathy with  myelopathy.   The patient underwent decompressive laminectomy September 16, 2008, by Dr.  Lovell Sheehan.  Pain control was initially with Percocet but that has been  adjusted to include morphine sulfate continuous release at present.  Back brace has been used when out of bed.  A wound care nurse has been  following for right calf full thickness wound while after she hit that  leg on a metal drawer at home.  The plan is to continue Aquagel to  absorb drainage.  She presently is on MS Contin 30 mg b.i.d. with p.r.n.  oxycodone immediate release for breakthrough pain.  IV Kefzol was added  September 19, 2008, for right lower extremity wound and the plan is to  continue for a few more days and then switch to oral antibiotics,  specifically Keflex.   The patient was evaluated by the rehabilitation physicians and felt to  be an appropriate candidate for inpatient rehabilitation.   REVIEW OF SYSTEMS:  Positive for lumbago and anxiety.   PAST MEDICAL HISTORY:  1. Hypothyroidism.  2. Gastroesophageal reflux disease.  3. Hypertension.  4.  Anxiety.  5. Left hip bursitis.  6. Prior cholecystectomy.  7. Left mastectomy for cancer.   FAMILY HISTORY:  Noncontributory.   SOCIAL HISTORY:  The patient lives with a daughter who can assist only  with light care with no heavy lifting.  The patient does not use alcohol  and has a remote tobacco usage history.  There is also a second daughter  living locally.  The patient lives in a one level home with three steps  to enter.   FUNCTIONAL HISTORY PRIOR TO ADMISSION:  Independent and active until  gradually increasing low back and leg pain.   ALLERGIES:  COX-2 INHIBITORS, STRAWBERRIES, NONSTEROIDAL ANTI-  INFLAMMATORY MEDICATIONS.   ADMISSION MEDICATIONS:  1. Nexium 40 mg daily.  2. Potassium chloride 20 mEq daily.  3. Lasix 40 mg daily.  4. Percocet p.r.n.  5. Diovan/hydrochlorothiazide 160/25 mg daily.  6. Ativan q.h.s.  7. Synthroid 75 mcg daily.   LABORATORY:  Recent hemoglobin was 13.1 with hematocrit of 39.3,  platelet count of 262,000 and white count of 9.5.  Recent sodium was  134, potassium 3.5, chloride 96, bicarbonate 32, BUN 15 and creatinine  0.9.   PHYSICAL EXAM:  GENERAL:  A reasonably well-appearing elderly adult  female lying in bed in mild to moderate acute discomfort.  VITAL SIGNS:  Blood pressure 128/70 with pulse of 80, respiratory rate  18, temperature 98.0.  HEENT:  Normocephalic, nontraumatic.  CARDIOVASCULAR:  Regular rate and rhythm.  S1-S2 without murmurs.  ABDOMEN:  Soft, nontender with positive bowel sounds.  LUNGS:  Lungs were clear to auscultation bilaterally.  BACK:  Showed well-healing wound with only mild drainage.  EXTREMITIES:  Bilateral upper extremity exam showed 4-/5 strength  throughout.  Bulk and tone were normal.  Bilateral lower extremity exam  showed 4-/5 strength with normal sensation.   DIAGNOSES:  1. Status post L1-2 decompressive laminectomy secondary to      degenerative disk disease/stenosis/radiculopathy with  myelopathy      September 16, 2008, by Dr. Lovell Sheehan.  2. History of multiple back surgeries.  3. Pain control presently on MS Contin 30 mg q.12 hours and p.r.n.      oxycodone immediate release 5 mg.  4. Hypertension on Benicar, hydrochlorothiazide and Lasix.  5. Hypothyroidism on supplement.  6. Anxiety on p.r.n. Ativan and Valium.  7. Right calf wound treated with IV Kefzol q.6 hours with eventual      switch to oral Keflex.   The patient will be admitted to receive collaborative and  interdisciplinary care between the physiatrist, rehab nursing staff and  therapy team.  The patient's level of medical complexity and substantial  therapy needs in context of that medical necessity cannot be provided at  a lesser intensity of care.  Physiatrist will provide 24-hour management  of medical needs as well as oversight of the therapy plan/treatment and  provide guidance as appropriate regarding the interaction of the two.  Twenty-four hour rehab nursing will assist in management of bowel and  bladder and help integrate therapy concepts, techniques and education.  Physical therapy will assess and treat for range of motion,  strengthening, bed mobility, transfers, pre gait training, gait training  and equipment evaluation.  Occupational therapy will assess and treat  for range of motion, strengthening, ADLs, cognitive/perceptional  training, splinting and equipment evaluation.  Case management and  social will coassess and treat for psychosocial issues and discharge  planning as appropriate.  Team conferences will be held weekly to  establish goals, assess progress and determine barriers to discharge.   PROGNOSIS:  Good.   ESTIMATED LENGTH OF STAY:  Ten to 15 days.   GOALS:  Modified independent, ADLs, transfers and standby  assist/ambulation.           ______________________________  Ellwood Dense, M.D.     DC/MEDQ  D:  09/20/2008  T:  09/20/2008  Job:  [id number]

## 2011-04-13 NOTE — Consult Note (Signed)
NEW PATIENT CONSULTATION   Alexa [name], Alexa [name]  DOB:  03-Jun-1936                                        January 22, 2010  CHART #:  [id number]   REQUESTING PHYSICIAN:  [name], MD   PRIMARY CARE PHYSICIAN:  Tawny Asal, MD, [place], [place].   ONCOLOGY:  Genene Churn. Cyndie Chime, MD   REASON FOR CONSULTATION:  Question right lung mass.   HISTORY OF PRESENT ILLNESS:  The patient has [name] long complicated medical  history, but basically the current problem, she was referred by Dr.  Lovell Sheehan, Neurosurgery, after the incidental finding on CT scan of an 18  x 20 mm mass in the right lung.  This was noted on [name] MRI myelogram that  she was having because of chronic back pain and problems post surgery.  In reviewing her chart, it appears that this area was previously noted  on [name] CT scan in March 2009.  The patient is referred for further  evaluation of the right lung nodule.   PAST MEDICAL HISTORY:  Significant for hypertension, treated  hyperlipidemia.  She denies diabetes, is [name] remote smoker, quit 25 years  ago.  Has had no previous stroke.  No renal insufficiency.   Past medical problems include:  1. History of kidney stones.  2. History of hospitalization for pneumonia 10 years ago.  3. History of vitamin D deficiency.  4. History of hypothyroidism.   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  This was  treated by left modified radical mastectomy followed by 37 radiation  treatments with 5047 Gy with [name] boost of 1267, finished in October 2002.  She also had simple mastectomy on the right.  Other previous surgeries  include lumbar fusion in 2005, lumbar fusion in 2002, cervical fusion in  November 2008, lumbar fusion in October 2009, and cholecystectomy in  November 2008.   SOCIAL HISTORY:  The patient lives with her daughter, husband died 16  years ago  of lung cancer at age 13.  She has one sister with breast  cancer, one daughter with history of breast cancer.  She retired in  2004, denies alcohol use.   MEDICATIONS:  1. Diovan HCT 160/25.  2. Levothyroxine 75 mcg.  3. Nexium 30 mg [name] day.  4. Lasix 40 mg p.r.n.  5. K-Lor 20 mEq.  6. Lorazepam 0.5 mg [name] day.  7. Compazine 10 mg p.r.n.  8. Morphine 30 mg p.r.n.  9. Percocet 10/325.  10.Glycopyrrolate 2 mg twice [name] day.   ALLERGIES:  Strawberries, which cause swelling many years ago.  Penicillin caused hives.  She has taken penicillin since that time  without reaction.   REVIEW OF SYSTEMS:  CARDIAC REVIEW OF SYSTEMS:  Positive for resting  shortness of breath, exertional shortness of breath, and lower extremity  edema.  She denies any chest pain, palpitations, syncope, presyncope, or  orthopnea.  GENERAL REVIEW OF SYSTEMS:  Her weight has been stable.  Denies  hemoptysis.  Flu vaccination and pneumococcal vaccination are up-to-  date, colonoscopy 2 years ago.  She denies any wheezing or hemoptysis.  Denies change in bowel habits.  Denies blood in her stool or urine.   PSYCHIATRIC HISTORY:  She is  on chronic pain medication for numerous  back operations.   PHYSICAL EXAMINATION:  Today, her blood pressure 137/73, pulse 84,  respiratory rate is 18, and O2 sats 98%.  She is 5 feet 3 inches tall,  185 pounds.  The patient is awake, alert, and neurologically intact.  She has no carotid bruits.  No cervical or supraclavicular adenopathy.  No axillary adenopathy.  She has had bilateral mastectomies and has  prosthesis.  Abdominal exam is benign without palpable masses or  tenderness.  Her lungs are clear bilaterally without wheezing.  She has  mild pedal edema.   CT scan results are reviewed.  The patient has [name] CT scan from March 2009  and [name] CT scan done this month that is almost identical and an ill-  defined density in the right lower lobe.  It is larger than from 2 years  ago, it  is within 1-2 mm.  There is no mediastinal adenopathy or other  lung lesions.   IMPRESSION:  Probable benign right lung opacity.  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  I would just follow this radiographically further, however,  with the patient's previous history of breast cancer and to help decide  if we need to do [name] needle biopsy of lesion or not.  We will obtain [name] PET  scan to further help evaluate the situation before deciding on  radiographic surveillance versus needle biopsy versus video-assisted  thoracoscopic surgery.   Sheliah Plane, MD  Electronically Signed   EG/MEDQ  D:  01/22/2010  T:  01/23/2010  Job:  [id number]   cc:   [name], M.D.  Ellin Saba., MD  Genene Churn. Cyndie Chime, M.D.

## 2011-04-13 NOTE — Assessment & Plan Note (Signed)
OFFICE VISIT   Alexa [name], Alexa [name]  DOB:  05-20-1936                                        July 30, 2010  CHART #:  [id number]   The patient returns to the office today with [name] followup CT scan.  She  was originally seen in February 2011.  At that time she had an  incidental finding of 18 x 20 mm mass in the right midlung, found by Dr.  Lovell Sheehan who performed [name] myelogram because of chronic back pain.  In  reviewing her films, the area had been present as early as March 2009.  In March 2011, [name] PET scan was performed and showed that was basically  negative and the decision after reviewing it [facility]  was to follow up with [name] CT scan in 6 months.  She does have [name] history of  stage I carcinoma of the breast, node negative, HER2 negative, ER  positive.  That was treated with modified radical mastectomy and  radiation treatment in 2002.  She also had [name] simple right mastectomy.  Since last seen, she has had no change in her medical status.  No  increasing shortness of breath.  No hemoptysis.  CT scan of the chest  was done today.   On exam blood pressure 150/80, pulse is 70, respiratory rate is 18, O2  saturations 95%.  She has no cervical or supraclavicular adenopathy.  She has had bilateral mastectomies.  I do not appreciate any axillary  adenopathy.  Abdominal exam is without palpable masses.  She has  varicose veins bilaterally but without calf tenderness.  Mild pedal  edema.   The CT scan of the chest is performed today and is basically unchanged  with right midlung zone area that has the same appearance as it did in  early 2011.  Still the major concern both on my part and also on the  patient's part, who has been reading on the internet, is that of  bronchioalveolar carcinoma.  With electromagnetic bronchoscopy now  available at [facility], I have discussed with her proceeding with  obtaining [name] tissue diagnosis.  On reviewing the  scans with Dr. Edwyna Shell,  it appears that there is technically bronchus that could give access to  this and avoid [name] percutaneous needle biopsy through [name] significant amount  of lung tissue.  I have discussed this with the patient and her two  daughters and she is willing to proceed this way.  We will obtain  pulmonary function studies this week.  She notes that she has [name] brother  with dementia in [place] and would like to go visit him in several  weeks.  So, we have arranged after return from her visit with her  brother to schedule the navigation bronchoscopy and biopsy in early  October.  The patient is willing to proceed with this and understands  the risk of bleeding and pneumothorax.   Sheliah Plane, MD  Electronically Signed   EG/MEDQ  D:  07/30/2010  T:  07/31/2010  Job:  [id number]   cc:   Genene Churn. Cyndie Chime, M.D.  Ellin Saba., MD

## 2011-04-13 NOTE — Op Note (Signed)
NAME:  Alexa [name], Alexa [name]              ACCOUNT NO.:  [id number]   MEDICAL RECORD NO.:  [id number]          PATIENT TYPE:  INP   LOCATION:  3306                         FACILITY:  [facility]   PHYSICIAN:  [name], M.D.DATE OF BIRTH:  May 27, 1936   DATE OF PROCEDURE:  09/16/2008  DATE OF DISCHARGE:                               OPERATIVE REPORT   BRIEF HISTORY:  The patient is a 75 year old white female who I  performed 2 prior fusions on, one from L3 to S1 and subsequent fusion at  L2-L3.  The patient has had chronic back pain, but generally had been  doing well with medical management until several months ago when she  developed an increasing back and leg pain.  She failed medical  management and worked up with a lumbar MRI which demonstrated the  patient had developed severe adjacent segment degenerative changes and  stenosis at L1-L2.  I discussed the various treatment options with the  patient and her family including surgery.  She has weighed the risks,  benefits, and alternatives of the surgery, decided to proceed with a L1-  L2 decompression with extension of her fusion up to the lower thoracic  region.  Because of the patient's persistent back pain, I discussed  exploring her fusion with her as well as an L1-L2 posterior lumbar  interbody fusion extension were fused up to the lower thoracic region.   PREOPERATIVE DIAGNOSES:  1. L1-2 degenerative disease.  2. Spinal stenosis.  3. Lumbar radiculopathy/myelopathy.  4. Lumbago.   POSTOPERATIVE DIAGNOSES:  1. L1-2 degenerative disease.  2. Spinal stenosis.  3. Lumbar radiculopathy/myelopathy.  4. Lumbago.   PROCEDURE:  Decompressive laminectomy at L1 to decompress the bilateral  L1 as well as L2 nerves in the thecal sac; left L1-L2 transforaminal  lumbar interbody fusion with local morselized autograft bone, Actifuse  and Vitoss bone graft extenders; insertion of L1-L2 interbody prosthesis  (Capstone PEEK interbody  prosthesis); exploration of lumbar fusion;  posterior segmental instrumentation T10-L4 with Legacy titanium pedicle  screws and rods; T10-T11, T11-T12, T12-L1, L1-L2 posterolateral  arthrodesis with local morselized autograft bone and Actifuse/Vitoss  bone graft extenders.   SURGEON:  [name], MD   ASSISTANT:  Hewitt Shorts, MD   ESTIMATED BLOOD LOSS:  400 mL.   SPECIMENS:  None.   DRAINS:  None.   COMPLICATIONS:  None.   DESCRIPTION OF PROCEDURE:  The patient was brought to the operating room  by anesthesia team.  General endotracheal anesthesia was induced.  The  patient was turned to the prone position on Wilson frame.  The  lumbosacral region was then prepared with Betadine scrub and Betadine  solution.  Sterile drapes were applied.  I then injected the area to be  incised with Marcaine with epinephrine solution and used a scalpel to  make a linear midline incision through the patient's previous surgical  scar.  We extended the incision into the lower thoracic region and used  electrocautery to perform a bilateral subperiosteal dissection, exposing  the spinous process and lamina from T9 down to the upper sacrum.  We  exposed the previous hardware.  We thus used various wrenches, etc. to  remove the old [facility]H hardware in order to explore the lumbar fusion.  After we removed the rods, the cross connectors, and the caps, we  attempted to independently move the pedicle screws from L2-S1.  We could  not move the pedicle screws at L3-L4, L4-L5, L5-S1.  There was some mild  motion at L2-L3, but none to clearly call this pseudoarthrosis.  Then,  there was an excellent fusion at L3-L4, L4-L5, and L5-S1.  We then  removed the pedicle screws at L5 from L2-S1 bilaterally.   We completed the exploration of fusion.  We now turned our attention to  the decompression.  We used the high-speed drill to perform bilateral L1  laminotomies.  An extensive decompression was  required at L1-L2 because  of the patient's degeneration and severe facet arthropathy, i.e. we  performed foraminotomies about the bilateral L1 and L2 nerve roots and  removed the medial aspect of the facet at L1-L2.  This completed the  decompression at this level.  X-ray work was needed to perform  decompression at this level because of the sever stenosis.   We now turned our attention to the transforaminal lumbar fusion.  We  used high-speed drill and the Kerrison punch to remove the inferior  facet at L1-L2 on the left.  This gave [place] a wide lateral exposure to the  intervertebral disk.  We incised the L1-L2 intervertebral disk with a 15-  blade scalpel and performed a partial intervertebral diskectomy with the  pituitary forceps and the curettes.  We then cleared the soft tissue  from the vertebral endplates at L1-L2 in order to perform the  transforaminal lumbar fusion.  We used a trial spacer and determined to  use a 10 x 26-mm Capstone PEEK interbody prosthesis.  We prefilled this  prosthesis with a combination of local autograft bone, Vitoss, and  Actifuse bone graft extenders.  We then placed the prosthesis into the  disk space, of course, after retracting the neural structures out of  harm's way.  Then, we filled anterior and posterior disk space with  autograft bone, Vitoss, and Actifuse.  This completed the transforaminal  lumbar interbody fusion.  I should mention that we turned the prosthesis  sideways using the bone tamps.   We now turned our attention to the instrumentation.  Under fluoroscopic  guidance, we cannulated the bilateral T10, T11, T12, and L1 pedicles  with the bone probe.  We then tapped the pedicles with a 5.5-mm tap and  then probed inside the tapped pedicles to rule out cortical breeches,  there were none.  We then inserted 6.5 x 40-mm pedicle screws  bilaterally at T10, T11, T12, and L1.  We used the old screw holes to  place the new Legacy screws at L2,  L3, and L4.  We placed 7.5 x 40-mm  pedicle screws bilaterally at L4 and L3.  We placed 6.5 x 40 on the  right that is L2.  We attempted to place on the left at L2, but the  pedicle fractured, we removed the screw.  We then placed a 5.5 x 40-mm  screw at L2 on the left.  We then drilled the medial aspect of the left  L2 pedicle to make sure that there was plenty of room for the exiting L2  nerve root.  We then cut a rod, contoured, and connected the unilateral  pedicle screws with rods.  We placed the rod, fastened the rod in place  with the caps which tightened appropriately and we placed a cross  connector between the rods.   We now turned our attention to the posterolateral arthrodesis.  We used  a high-speed drill to decorticate the lamina and facets at T10-T11, T11,  T12, T12-L1, L1-L2, and L2-L3.  We then laid a combination of local  autograft bone, Vitoss, and Actifuse bone graft extenders over these and  decorticated posterolateral structures.  This completed the  posterolateral arthrodesis.  We then inspected the thecal sac at L1-L2  and bilateral L1 and L2 nerve roots and noted it well decompressed.   I should mention that after we removed some of the [facility] hardware, we did  notice at L5-S1 that the dura had been quite eroded away by the  instrumentation and was paper thin.  There was a little bit spinal fluid  seeping from the very thin dura.  We then laid a piece of DuraGen over  this dura and placed Tisseel over the DuraGen.   I should also mention that we placed bone morphogenic protein-soaked  collagen sponges over these to decorticate posterolateral structures to  augment the posterolateral fusion at T10-T11, T11-T12, T12-L1, L1-L2,  and L2-L3.   We then obtained hemostasis using bipolar electrocautery.  We removed  the retractor and then reapproximated the patient's thoracolumbar fascia  with an interrupted #1 Vicryl suture, subcutaneous tissue with  interrupted 2-0  Vicryl suture, and the skin with Steri-Strips and  Benzoin.  The wound was then coated with bacitracin ointment and sterile  dressing was applied.  The drapes were removed.  The patient was  subsequently extubated by the anesthesia team and transported to  postanesthesia care unit in stable condition.  All sponge, instrument,  and needle counts were correct at the end of the case.      [name], M.D.  Electronically Signed     JDJ/MEDQ  D:  09/16/2008  T:  09/17/2008  Job:  [id number]

## 2011-04-13 NOTE — Assessment & Plan Note (Signed)
OFFICE VISIT   Alexa [name], Alexa [name]  DOB:  05-09-1936                                        December 11, 2010  CHART #:  [id number]   HISTORY:  The patient was originally seen in the office as [name] new consult  in February 2011.  At that time, she was noted to have incidental  finding of an 18- x 20-mm mass in the right lung, negative on PET scan.  She has [name] previous history of stage I left breast carcinoma with  negative nodes, HER-2 negative, ER positive, treated with left modified  radical mastectomy, followed by 37 radiation treatments with 1047 gray  and [name] boost of 1267 finished in October 2002.  She chose to have [name]  simple mastectomy on the right.  She had no evidence of recurrence since  then.  When originally seen in February, previous CT scan done in March  2009 was located to further evaluate the right lung lesion and it was  present there.  Lesion had been followed with subsequent CT 3 months  later and had not changed in size.  Ultimately on September 03, 2010, an  electromagnetic navigation bronchoscopy was performed.  Lung tissue was  obtained but no malignant cells were identified.  The patient did not  wish to proceed with resection and she returns today with [name] followup CT  scan of the chest.  Since last seen, she has had no change in pulmonary  symptoms.  She denies any hemoptysis.   PHYSICAL EXAMINATION:  Her blood pressure is 148/79, pulse 70,  respiratory rate 16, O2 sat is 93%.  Her lungs are clear bilaterally.  I  do not appreciate any cervical or supraclavicular adenopathy.  Right  shoulder is tender.  She notes that she had injured it and is now  tentatively having rotator cuff repair surgery done in the near future.  Abdominal exam is benign without palpable masses or tenderness.  She has  no calf tenderness.  She has +2 DP and PT pulses bilaterally.   MEDICATIONS:  She continues on the same medication, also the previous  list include  Diovan, Levothroid, Nexium, Lasix, K-Dur, lorazepam,  Compazine, morphine.   ALLERGIES:  Strawberries and penicillin.   IMAGING:  Repeat CT scan is reviewed and discussed with the patient [name]  patchy opacity in the posterior right upper lobe has slightly decreased  in size from the previous scan measuring 2.7 x 2.4 compared to 3.2 x 2.4  previously.  There are no new pulmonary nodules.  No evidence of  mediastinal adenopathy.   ASSESSMENT AND PLAN:  With the negative PET scan without further  enlargement, the patient has decided to continue with close follow up.  We will obtain [name] CT scan in 6 months.  I have discussed with them in  detail the other option to proceed with surgical resection as really the  only wait at this point to ensure that we are not dealing with [name] slow  growing bronchioalveolar carcinoma.  The patient understands this, and  at this point especially wishes to wait.  I plan to see her back in 6  months with [name] followup CT scan of the chest and I have discussed with  her should we see any enlargement that we should proceed with surgical  resection.   Ramon Dredge  Tyrone Sage, MD  Electronically Signed   EG/MEDQ  D:  12/11/2010  T:  12/12/2010  Job:  [id number]   cc:   Ellin Saba., MD  Genene Churn. Cyndie Chime, M.D.

## 2011-04-13 NOTE — Assessment & Plan Note (Signed)
OFFICE VISIT   [name], [name]  DOB:  1936-06-05                                        September 10, 2010  CHART #:  [id number]   HISTORY:  The patient comes in today in followup after recent  electromagnetic navigation bronchoscopy with biopsy of right upper lobe  lung lesion.  The patient was first seen by me in February 2011, after  Dr. Lovell Sheehan noted a right lung mass on incidental finding on a CT scan.  She has a history of hospitalization for pneumonia more than 10 years  ago.  In reviewing her films, an area of infiltrative parenchymal  opacity has been present in the right upper lobe since at least March  2009.  A PET scan of the chest was performed that showed no uptake in  the right upper lobe mass-like opacity.  A followup scan was performed  that showed little if any change.  However, because of the concern of  possible bronchioalveolar carcinoma, Dr. Edwyna Shell last week performed  electromagnetic navigation bronchoscopy and obtained pulmonary tissue,  but without any evidence of atypia or malignancy.  The patient comes in  today to review the pathology results and I have discussed them with her  and her daughters.  At this point, we either proceed with surgical  resection of the lung lesion that has been present since 2009, PET  negative or continue to watch this.  At this point, I have recommended  to the patient that we continue to watch this area and we will repeat a  CT scan in 3 months.  The patient and her daughters are aware that  although the biopsy results are negative, there is a real, perhaps a  size 20% false negative with such needle biopsies and that close  followup is still required.  If we see enlargement and/or consolidation,  then we will proceed with surgical resection.   PHYSICAL EXAMINATION:  Today, the patient's blood pressure 162/75, pulse  76, respiratory rate is 16, and O2 sats 95%.  Her lungs are clear  bilaterally.  I do  not appreciate any cervical, supraclavicular, or  axillary adenopathy.  She has no calf tenderness.   PLAN:  We will plan to see her back in 3 months.   Sheliah Plane, MD  Electronically Signed   EG/MEDQ  D:  09/10/2010  T:  09/11/2010  Job:  [id number]   cc:   Genene Churn. Cyndie Chime, M.D.  Ellin Saba., MD

## 2011-04-13 NOTE — Discharge Summary (Signed)
NAME:  Mancinelli, Harlem              ACCOUNT NO.:  [id number]   MEDICAL RECORD NO.:  [id number]          PATIENT TYPE:  IPS   LOCATION:  4007                         FACILITY:  [facility]   PHYSICIAN:  Ranelle Oyster, M.D.DATE OF BIRTH:  December 04, 1935   DATE OF ADMISSION:  09/20/2008  DATE OF DISCHARGE:  09/28/2008                               DISCHARGE SUMMARY   ADDENDUM   On the day of discharge, returned lab showed a potassium of 3.9, BUN 32,  creatinine 1.34.  Due to these mild elevated levels, her Lasix was held  as well as hydrochlorothiazide.  She was given a prescription for  Benicar.  She would hold Lasix for 2 days and resume on September 30, 2008.  Followup chemistries would be performed by home health nurse on  October 01, 2008, with results to Dr. Faith Rogue.  All these issues  were discussed with the patient's and family.  Also, her sutures were  removed from her back incision on September 28, 2008, prior to discharge.  Site was clean and dry.  Her MS Contin was slowly tapered from 45 mg  twice daily x2 weeks to 30 mg x2 weeks and stop.  She was given 140  tablets regarding this.  She will continue with oxycodone immediate  release as needed for breakthrough pain.      Mariam Dollar, P.A.      Ranelle Oyster, M.D.  Electronically Signed    DA/MEDQ  D:  09/27/2008  T:  09/27/2008  Job:  [id number]

## 2011-04-13 NOTE — Assessment & Plan Note (Signed)
OFFICE VISIT   [name], [name]  DOB:  Jun 15, 1936                                        January 29, 2010  CHART #:  [id number]   The patient was seen last week after being incidentally found to have a  18 x 22 cm mass in her right lung incidentally on an MRI done because of  back pain.  On going back to her history, the films could not be  reviewed, but does not mention the mass comparing the CT scan from 2009  until last week, appears to be no change in the right lung mass.  Because of the patient's previous history, a PET scan was recommended  and done yesterday.  She returns today to review symptoms and discuss  her scan.   Since last seen, she developed cellulitis in her right leg and was  treated by Dr. Clent Ridges covering for Dr. Scotty Court for cellulitis in the right  leg and was started on Cipro, this seems to be improving.   Today, her blood pressure 173/80, pulse is 84, respiratory rate is 18,  and O2 sat is 98%.  Lungs are clear bilaterally.  She has no cervical or  supraclavicular adenopathy.  As noted, the cellulitis in the right lower  leg and the ankle was resolved.   A PET scan was performed on January 27, 2010.  The area in question in the  right lung had a maximum SUV of background range at 1.5.  There were no  other areas of abnormal uptake.  She did have right inguinal lymph node  measuring 1.3 cm, but there was no corresponding FDG uptake.  The  patient's films and case was discussed in the [facility] today and the consensus was that the area in the right  lung was most likely benign and at this point with the negative PET scan  and stable radiographically since 2009 that we would  follow this without surgical intervention.  We will plan to see her back  in 6 months with a repeat CT scan.   Sheliah Plane, MD  Electronically Signed   EG/MEDQ  D:  01/29/2010  T:  01/30/2010  Job:  [id number]   cc:   Genene Churn.  Cyndie Chime, M.D.  Ellin Saba., MD  [name], M.D.

## 2011-04-13 NOTE — Procedures (Signed)
LOWER EXTREMITY VENOUS REFLUX EXAM   INDICATION:  Varicose veins, leg pain per standing order.   EXAM:  Using color-flow imaging and pulse Doppler spectral analysis, the  bilateral common femoral, superficial femoral, popliteal, posterior  tibial, greater and lesser saphenous veins are evaluated.  There is  evidence suggesting deep venous reflux of >500 milliseconds noted  throughout the bilateral lower extremities.   The right saphenofemoral junction demonstrates reflux of  >552milliseconds. The left saphenofemoral junction is competent.  The  right GSV was not adequately visualized.  The left non-tortuous  GSV  demonstrates reflux of >561milliseconds with the caliber as described  below.   The bilateral proximal short saphenous veins demonstrate competency.   GSV Diameter (used if found to be incompetent only)                                            Right    Left  Proximal Greater Saphenous Vein           cm       0.53 cm  Proximal-to-mid-thigh                     cm       cm  Mid thigh                                 cm       0.51 cm  Mid-distal thigh                          cm       cm  Distal thigh                              cm       0.42 cm  Knee                                      cm       0.40 cm   IMPRESSION:  1. Left greater saphenous vein reflux is noted, as described above.      The right greater saphenous vein was not adequately visualized.  2. The deep venous system demonstrates reflux, as described above.  3. The bilateral short saphenous veins are competent.   ___________________________________________  Larina Earthly, M.D.   CH/MEDQ  D:  12/02/2010  T:  12/02/2010  Job:  [id number]

## 2011-04-16 NOTE — Assessment & Plan Note (Signed)
[facility]                           GASTROENTEROLOGY OFFICE NOTE   NAME:Alexa [name], Alexa [name]                     MRN:          [id number]  DATE:09/06/2006                            DOB:          01-Mar-1936    HISTORY OF PRESENT ILLNESS:  Alexa [name] returns to see me at the  recommendation of Dr. Richardean Chimera for right sided abdominal pain.  She has  noted right lower quadrant pain that radiates to the right upper quadrant  and right lower back for the past several months.  Her symptoms are not  related to any gastrointestinal function.  She has had some intermittently  small amounts of vaginal bleeding.  CT scan of the abdomen and pelvis  performed on August 03, 2006, revealed common bile duct dilation up to 13  mm with minimal central intrahepatic biliary dilatation.  She has had  previous imaging studies with similar findings.  Pancreatic abnormalities  and cholelithiasis have not been previously demonstrated.  She has pelvic  exam and pelvic ultrasound in Dr. Lisbeth Ply office that were apparently  unremarkable except for atrophic vaginal changes.  She notes no melena,  hematochezia, constipation, diarrhea, nausea or vomiting  She does have  ongoing back pain which generally has been worse in the left lower back but  now it noticeable in the right lower back.  She notes fatigue and a weight  loss of about 4 pounds over several months.   CURRENT MEDICATIONS:  Listed on the chart, updated and reviewed.   ALLERGIES:  PENICILLIN.   PHYSICAL EXAMINATION:  GENERAL:  No acute distress.  VITAL SIGNS:  Weight 199.8 pounds, blood pressure 106/54, pulse 60 and  regular.  HEENT:  Anicteric sclerae.  Oropharynx clear.  CHEST:  Clear to auscultation bilaterally.  CARDIAC:  Regular rhythm without murmurs appreciated.  ABDOMEN:  Soft, nondistended, mild right lower quadrant and right mid  abdominal tenderness to deep palpation.  N rebound or guarding.  No  palpable  organomegaly, masses or hernias.  BACK:  Tenderness across the entire lumbar region.  EXTREMITIES:  Without cyanosis, clubbing or edema.  NEUROLOGICAL:  Alert and oriented x3.  Grossly nonfocal.   ASSESSMENT/PLAN:  Right lower quadrant pain, right mid abdominal pain and  right back pain.  This does not appear to be gastrointestinal in etiology.  I am concerned about musculoskeletal and neuropathic disorders.  She has had  biliary dilatation noted in the past which is likely incidental.  However,  we will repeat her abdominal ultrasound and obtain a C-met, CBC, amylase,  lipase and stool hemoccults today.  Consider further evaluation with MRCP  pending the findings in the above evaluation.  She previously had a MRCP in  December 2004 that was unremarkable.  Previously recommend for December  2009.       Alexa Lick. Russella Dar, MD, [facility]      MTS/MedQ  DD:  09/06/2006  DT:  09/08/2006  Job #:  [id number]   cc:   Alexa [name], M.D.  Alexa [name]., MD

## 2011-04-16 NOTE — Op Note (Signed)
[facility]  Patient:    Alexa [name], Alexa [name]                     MRN: [id number] Proc. Date: 06/02/01 Adm. Date:  [id number] Attending:  Carson Myrtle                           Operative Report  PREOPERATIVE DIAGNOSIS:  Left breast cancer.  POSTOPERATIVE DIAGNOSIS:  Left breast cancer.  PROCEDURE:  Right simple mastectomy, left modified radical mastectomy.  SURGEON:  Kendrick Ranch, M.D.  ASSISTANT:  Ovidio Kin, M.D.  ANESTHESIA:  CRNA supervised Dr. Dwaine Deter.  HISTORY OF PRESENT ILLNESS:  Alexa [name] is an otherwise healthy 75 year old female who noticed a nodule in her left breast. [facility] was [facility] to [facility], a needle biopsy was done showing invasive adenocarcinoma. On exam, she had about a 1.5 cm nodule in the lower outer quadrant, left breast and palpable lymph node in the left axilla. The patient has a strong family history with one sister with breast cancer. After careful and multiple consultations, she and her family namely her three grown daughters have elected to proceed with a modified radical mastectomy on the left and a simple mastectomy on the right. She has delayed hematologic and radiation consultations but has seen reconstructive surgeons and if reconstruction is to be done, it will be done at a later time. She is ready and anxious to proceed at this time with the above named surgery. She agrees and understands to the specifics of the procedure.  DESCRIPTION OF PROCEDURE:  The patient was brought to the operating room, placed supine. She was carefully padded, comforted and positioned while awake and she has recently had back surgery. General endotracheal anesthesia was administered. The chest was prepped in its entirety. Each breast was carefully marked as was the midline, the inframammary fold, the nipple, and the site of the mass in the lower outer quadrant, left breast. We approached the right breast first  making and elliptical incision and then raising flaps in each of the four directions and then the breast was removed from the chest wall in a standard fashion. This was accomplished around the pectoralis major and into the soft tissue of the mid axillary line. Some lymph nodes were noted at the tail of the breast. This specimen was marked and passed off the field as right breast simple mastectomy diagnosis left breast cancer. That wound was covered, hemostasis was good and counts correct at that point.  Gloves were changed, the left breast was approached in a similar fashion. A more generous elliptical incision was made and we began raising flaps first inferiorly and as I came upon the nodule which was palpable both within the breast dissection and from the skin, it appeared that the nodule was attached to the skin so a generous ellipse of skin was made around the palpable nodule at the skin margin and that ellipse of skin was cut through into the plane of dissection for the mastectomy and the ellipse of skin was included with the mastectomy specimen. Inferior flap completed, the superior flap completed and then the breast removed from middle to lateral off the chest wall including the pectoral fascia. The pectoralis major and minor were dissected and retracted medially. The axilla was entered both by blunt and sharp dissection. The axillary vein was identified and gentle blunt dissection from the axillary vein inferiorly was accomplished. We identified  both the long thoracic and thoracodorsal nerves. These were left intact. The intervening soft tissue and lymph nodes were dissected down and included with the mastectomy specimen. This specimen was marked, carefully labeled and sent to pathology fresh. Bleeding had been controlled with the cautery, the wound was irrigated, hemostasis was good. Again gloves were changed and drains were applied to both operative sites and brought out  inferiorly and sewn in place. With the wounds dry and clean, all counts correct, the skin was closed with wide skin staples. Suction was applied to the drains and a dry sterile dressing applied. The patient tolerated the procedure well. Estimated blood loss less than 200 cc, none replaced, no complications. The patient was removed to the recovery room in good condition. DD:  06/02/01 TD:  06/02/01 Job: [id number]

## 2011-04-16 NOTE — Op Note (Signed)
[facility]  Patient:    Alexa [name], Alexa [name]                     MRN: [id number] Proc. Date: 02/13/01 Adm. Date:  [id number] Attending:  Tressie Stalker D                           Operative Report  PREOPERATIVE DIAGNOSIS:  L3-4, L4-5, L5-S1 degenerative disk disease, spondylosis, lumbago, lumbar radiculopathy.  POSTOPERATIVE DIAGNOSIS:  L3-4, L4-5, L5-S1 degenerative disk disease, spondylosis, lumbago, lumbar radiculopathy.  OPERATION PERFORMED:  L3-4, L4-5, L5-S1 posterior lumbar interbody fusion with insertion of Synthes TLIF cortical bone dowels, posterior segmental instrumentation of L3 to S1 bilaterally with insertion of [place] TSRH titanium pedicle screws and rods, posterolateral arthrodesis with local morselized autograft bone, cancellous allograft bone and Grafton putty.  SURGEON:  [name], M.D.  ASSISTANT:  Hewitt Shorts, M.D.  ANESTHESIA:  General endotracheal.  ESTIMATED BLOOD LOSS:  600 cc.  SPECIMENS:  None.  DRAINS:  None.  COMPLICATIONS:  None.  INDICATIONS FOR PROCEDURE:  The patient is a 75 year old white female who has suffered from intractable back and leg pain which has failed extensive nonsurgical management including rest, physical therapy, steroid injections, etc.  She was worked up with a lumbar MRI and then lumbar diskogram which demonstrates significant disk pathology at L3-4, L4-5 and L4-S1.  I discussed the various treatment options with her including do nothing, continuing with medical management, surgery.  The patient weighed the risks, benefits and alternatives of surgery and decided to proceed with lumbar decompression, stabilization and fusion.  As she has failed medical management.  DESCRIPTION OF PROCEDURE:  The patient was brought to the operating room by the anesthesia team.  General endotracheal was induced.  The patient was turned to the prone position on the chest rolls.  Her  lumbosacral region was then prepared with Betadine scrub and Betadine solution and sterile drapes were applied.  I injected the area to be incised with Marcaine and with epinephrine solution and used the scalpel to make a vertical incision in the midline over the L3-4, L4-5 and L5-S1 interspaces.  I used electrocautery to dissect down to the thoracolumbar fascia.  I divided it bilaterally performing a bilateral subperiosteal dissection, stripped the paraspinous musculature from the spinous process and lamina of L3, L4, L5 and the upper sacrum.  I inserted the McCullough retractor for exposure and then obtained intraoperative radiograph to confirm the location.  I then used the high speed drill to perform bilateral L3, L4, L5 laminotomies.  I used the Kerrison punch to widen the laminotomy and removed the bilateral L3-4, L4-5 and L5-S1 ligamentum flavum.  I did a more extensive laminotomy on the left side and removed the medial aspect of the facet joint on that side.  I then used the [place] #4 to free up the thecal sac and the nerve roots and then I was able to use the DErrico retractor to retract the neural structures out of harms way and perform an aggressive diskectomy, bilaterally at L3-4, L4-5 and L5-S1. I scraped the vertebral end plates with Epstein and Scoville curets and the various curets from the synthes TLIF set to remove soft tissue from the vertebral end plates.  I then carefully retracted the neural structures medially and then inserted the trial spacers into L3-4, L4-5 and L5-S1 interspaces and subsequently placed a 7 mm TLIF bone graft  in the L5-S1 interspace and an 11 mm TLIF bone graft in the L3-4 and L4-5 interspaces. I then completed the posterior lumbar interbody fusion by packing posterior to the cancellous TLIF bone dowel with cancellous allograft bone.  I completed the posterior lumbar interbody fusion at L3-4, L4-5 and L5-S1.  I now turned my attention to the  posterior segmental instrumentation.  I used electrocautery to expose the bilateral L3, L4, L5 transverse processes and then under fluoroscopic guidance, I decorticated posterior to the bilateral L3, L4, L5 and upper sacral pedicle.  I cannulated the pedicle with the pedicle probe, tapped the pedicle and then inserted the 6.35 x 40 mm pedicle screw bilaterally at L3, 4 and 5 and a 6.35 x 30 mm pedicle screw bilaterally at S1.  I did all of the work on the pedicles under fluoroscopic guidance.  I then palpated about the medial surface of the pedicle bilaterally and there was there was no damage to any of the nerves.  I then got a 10 mm lordotic rod and used it to connect the unilateral pedicles and fastened them in the appropriate fashion.  I then placed a cross connector to connect the two rods.  Having completed the posterior segmental instrumentation, I now turned my attention to posterolateral arthrodesis.  I used a high speed drill to clear soft tissue and to decorticate the remainder of L3-4, L4-5 and L5-S1 facets, pars and transverse processes.  I then laid a combination of local morselized autograft bone (which was saved during decompression) and cancellous allograft bone and Grafton putty over the decorticated posterolateral structures to complete the posterolateral arthrodesis.  I then achieved astringent hemostasis and then inspdected the thecal sac bilaterally at L3-4, L4-5 and L5-S1 as well as the bilateral L4, 5, and S1 nerve roots and noted that the neural structures were well decompressed.  ____________ once I placed the cross connector to connect the rods (in order to do this I had to remove the spinous process at L4 and L5 to  make room for the cross connector) I then reapproximated the patients thoracolumbar fascia with interrupted #1 Vicryl suture, subcutaneous tissues with interrupted #1 Vicryl suture DD:  02/13/01 TD:  02/14/01 Job: [id number]

## 2011-04-16 NOTE — Discharge Summary (Signed)
[place]  Patient:    Alexa [name], Alexa [name]                       MRN: [id number] Adm. Date:  02/20/01 Disc. Date: 02/24/01 Attending:  Rande Brunt. Thomasena Edis, M.D. Dictator:   Dian Situ, PA CC:         Lacretia Leigh. Quintella Reichert, M.D.  [name], M.D.   Discharge Summary  DISCHARGE DIAGNOSES: 1. Status post lumbar fusion. 2. Postoperative anemia. 3. Urinary tract infection. 4. Urinary retention, resolved. 5. Hypertension.  HISTORY OF PRESENT ILLNESS:  Alexa [name] is a 75 year old female with a history of asthma, hypercholesterolemia, and neck and back pain with left lower extremity radiculopathy for the past few months.  She has failed conservative therapy with x-rays revealing significant DDD at L3-4, L4-5, and L5-6, and S1 with HNP.  She elected to undergo L3-S1 fusion with TLIF interbody fusion on February 13, 2001, by [name], M.D. Postoperatively, she has had problems with good pain control.  She is noted to have issues with hypoxia with oxygen discontinued today.  Dr. Sharol Harness was consulted for input and recommended hand-held nebulizers with incentive spirometry and careful O2 titration.  Part of this hypoxia is related to narcotics.  The Foley was discontinued at the time of admission.  The patient was noted to be minimal to maximum assistance for bed mobility and minimal assistance for ambulating 35 feet with a rolling walker.  PAST MEDICAL HISTORY:  Significant for GERD, asthma, DJD, DDD, vein stripping, renal calculi with lithotripsy, hysterectomy, hemorrhoidectomy, right rotator cuff repair, and history of pneumonia requiring hospitalization in April of 2001.  ALLERGIES:  PENICILLIN.  SOCIAL HISTORY:  The patient lives with her daughter in a one-level home with two steps at entry.  She was independent prior to admission.  She does not use any tobacco or alcohol.  HOSPITAL COURSE:  Alexa [name] was  admitted to rehabilitation on February 20, 2001, for inpatient therapies to consist of PT and OT daily.  At the time of admission, the patients incision was noted to be healing well.  It was dry without signs of erythema or tenderness.  She was noted to be extremely sedated, arousable, but speech was noted to be slurred with the patient showing difficulty following commands.  Check of O2 saturations on room air was noted to be 85%.  Oxygen was resumed.  Her OxyContin CR was cut in half with limitations in her p.r.n. medications.  As a low-grade temperature was noted, she was encouraged to follow through with incentive spirometry.  Her family has been supportive and has been present to assist the patient as needed.  The patient did do well on decreased doses of OxyContin, as well as other changes, including discontinuing Valium and switching of Flexeril to Skelaxin.  Albuterol nebulizers were continued until mentation improved.  The patient was noted to have problems voiding initially and was started on Flomax q.h.s. with resolution in her retention problems.  A UAUC done at admission was negative, however, she had increased complaints of dysuria.  A repeat UA showed less leukocytes with many bacteria and white blood cells being too numerous to count.  She was started on empiric antibiotics at the time of discharge.  The CBC done at admission showed H&H 8.8 and 26.1.  Repeat showed no improvement at 8.6 and 25.7.  Secondary to the patients endurance issues, as well as hypoxia, she was transfused with  two units of packed red blood cells with a subsequent rise in her hemoglobin of 10.4 and hematocrit to 30.6.  Stool guaiacs were done and were negative.  She was noted to be mildly hypokalemic at admission and was supplemented.  Recheck on February 23, 2001, prior to discharge showed a sodium of 138, potassium 4.1, chloride 98, CO2 32, BUN 10, creatinine 0.8, and glucose 127.  LFTs were within normal  limits.  The patients back incision continued to heal well with no signs of infection or drainage noted.  She did progress to being supervision to modified independent for transfers, modified independent to ambulating 100 feet with a rolling walker, and supervision with assistive device for ADLs needs.  She did require assistance of brace.  The family is to provide 24-hour supervision post discharge.  The patient is to continue with progressive PT and OT by [facility] post discharge.  DISPOSITION:  On February 24, 2001, the patient was discharged to home.  DISCHARGE MEDICATIONS: 1. Zocor 20 mg q.h.s. 2. Protonix 40 mg per day. 3. Albuterol MDIs two puffs q.i.d. 4. Trinsicon one p.o. b.i.d. 5. Skelaxin 400 mg q.8h. 6. OxyContin CR 10 mg b.i.d. to taper with OxyContin IR 5-10 mg p.o. q.4-6h.    p.r.n. pain. 7. ______ 4 mg q.h.s. x 5 days. 8. Amoxicillin 250 mg p.o. q.i.d. x 7 days.  ACTIVITY:  Assistance as needed with routine back precautions.  DIET:  Regular.  SPECIAL INSTRUCTIONS:  No alcohol.  No smoking.  No driving.  Follow-up PT and OT by [facility].  FOLLOW-UP:  The patient is to follow up with [name], M.D., in the next few weeks.  Follow up with Reuel Boom L. Thomasena Edis, M.D., as needed. DD:  03/28/01 TD:  03/29/01 Job: [id number]

## 2011-04-16 NOTE — Op Note (Signed)
NAME:  Alexa [name], Alexa [name]              ACCOUNT NO.:  [id number]   MEDICAL RECORD NO.:  [id number]          PATIENT TYPE:  AMB   LOCATION:  DAY                          FACILITY:  [facility]   PHYSICIAN:  Timothy E. Earlene Plater, M.D. DATE OF BIRTH:  [birth year]   DATE OF PROCEDURE:  10/24/2006  DATE OF DISCHARGE:                               OPERATIVE REPORT   PREOPERATIVE DIAGNOSIS:  Cholecystolithiasis.   POSTOPERATIVE DIAGNOSIS:  Subacute cholecystolithiasis.   OPERATIVE PROCEDURE:  Cholecystectomy and cholangiogram.   SURGEON:  Timothy E. Earlene Plater, M.D.   ANESTHESIA:  General.   Ms. Grosz is 55, generally in fair health with severe disabling pain  due to back and joint pain and prior history of breast cancer.  She  developed signs and symptoms of gallbladder disease, evaluated by Dr.  Russella Dar and referred for surgery, which is being done at her convenience.  She was seen, evaluated and identified preop, the permit signed,  antibiotics given.   She was taken to the operating room and placed supine, general  endotracheal anesthesia administered.  PAS hose were applied and turned  on.  The abdomen was prepped and draped in the usual fashion.  Marcaine  0.25% with epinephrine was used prior to each incision.  A vertical  infraumbilical incision made, the fascia was identified, opened in the  midline, the peritoneum entered without complications.  A Hasson  catheter placed, tied in place with a #1 Vicryl, and the abdomen  insufflated.  General peritoneoscopy was unremarkable.  The gallbladder  did appear distended and thickened.  A second 10 mm trocar in the  midepigastrium and two 5 mm trocars in the right upper quadrant.  Indeed, the gallbladder was tense and thickened.  It was aspirated and  then grasped, placed on tension.  Careful dissection of the base of the  gallbladder revealed a large cystic duct but clearly entering the  gallbladder.  This was encircled, a complete window made behind  the  cystic duct.  The anterior branch of the cystic artery dissected, triply  clipped and divided.  A clip placed on the cystic duct on the  gallbladder entrance, an incision made in the cystic duct.  A catheter  placed percutaneously was inserted into the cystic duct remnant and  clipped in place.  Then using real-time fluoroscopy a cholangiogram was  made, showing a quite remarkably dilated biliary ductal system but free  flow of dye through a normal ampulla at the duodenum.  No stones were  seen.  The clip and catheter removed.  The stump of the cystic duct  triply clipped and then fully divided.  Gallbladder dissected from the  gallbladder bed.  The posterior branch of the cystic artery also  dissected out, doubly clipped and divided.  The gallbladder was removed  gallbladder from the bed without complication or incident.  The  gallbladder bed well-visualized as well as all the clips, and they were  intact.  No bleeding or complications.  The gallbladder was placed in an  EndoCatch bag and delivered from the abdomen through the infraumbilical  incision, which was tied securely.  Copious irrigation carried out, and  it was clear on removal.  All instruments, trocars, gas and irrigant  removed under  direct vision.  Then all incisions checked, the counts were all correct.  The incisions closed with Monocryl and Steri-Strips.  The patient  tolerated it well.  Dry sterile bandages applied and she was awakened,  taken to recovery room in good condition.      Timothy E. Earlene Plater, M.D.  Electronically Signed     TED/MEDQ  D:  10/24/2006  T:  10/24/2006  Job:  [id number]   cc:   Venita Lick. Russella Dar, MD, FACG  [address]  [place]  [place] [address]   Ellin Saba., MD  [address] [place]  [place] [address]

## 2011-04-16 NOTE — H&P (Signed)
[place]  Patient:    Alexa [name], Alexa [name]                     MRN: [id number] Adm. Date:  [id number] Attending:  Tressie Stalker D                         History and Physical  CHIEF COMPLAINT:  Back pain.  HISTORY OF PRESENT ILLNESS:  The patient is a 75 year old white female who I have seen for several years secondary to low back pain.  She has been treated with multiple modalities including rest, medications, physical therapy, steroid injections, etc.  She failed medical management and continued to have incapacitating back pain and she was further worked up with a lumbar MRI as well as a lumbar discogram which demonstrated degenerative disk disease, herniated nucleus pulposus, with concordant pain at L3-4, L4-5, and L5-S1.  I discussed the various treatment options with her, including doing nothing, continued medical management, steroid injections, and surgery.  The patient weighed the risks, benefits, and alternatives of surgery and decided to proceed with a lumbar decompression and fusion.  Her complaints are mainly of back pain which is incapacitating.  She describes pain radiating to her left buttocks and down her left leg with numbness and tingling diffusely in both legs.  PAST MEDICAL HISTORY:  Positive for arthritis, hypercholesterolemia, asthma, peptic ulcer disease.  She had a history of angina in the late 1980s and was worked up with a cardiac catheterization which was okay.  History of nephrolithiasis, varicose veins, pneumonia.  PAST SURGICAL HISTORY:  Status post rotator cuff surgery in 1995, hysterectomy in 1971, nephrolithiasis in 1984, vein stripping in 1974 and 1978.  MEDICATIONS PRIOR TO ADMISSION: 1. Zocor 20 mg p.o. q.d. 2. Prevacid 30 mg p.o. q.d. 3. Darvocet/Vicodan p.r.n. for pain.  DRUG ALLERGIES:  PENICILLIN causes a rash.  FAMILY MEDICAL HISTORY:  Cerebrovascular accident in the patients mother who died at age  48.  Myocardial infarction and arthritis in the patients father who died at age 45.  SOCIAL HISTORY:  The patient is a widow, she lives in [place], she has three daughters.  She denies tobacco and ethanol use.  She is employed as a [employer] operational officer.  REVIEW OF SYSTEMS:  Negative except as above.  PHYSICAL EXAMINATION:  GENERAL:  A moderately obese, pleasant 75 year old white female complaining of back pain.  HEENT:  Normocephalic, atraumatic.  Pupils are equal, round and reactive to light.  NECK:  Supple.  There are no masses, meningismus, deformities, tracheal deviation, jugular venous distention, or carotid bruits.  THORAX:  Symmetric.  LUNGS:  Clear to auscultation.  HEART:  Regular rate and rhythm.  ABDOMEN:  Obese, soft, nontender.  BACK:  There is no point tenderness, deformities.  Straight leg raise testing and faberes testing are equivocal bilaterally, causing back pain only left greater than right.  EXTREMITIES:  No obvious deformities.  She has a large birthmark on her right buttock and leg.  NEUROLOGIC:  The patient is alert and oriented x 3.  Cranial nerves 2-12 are grossly intact bilaterally.  Vision and hearing are grossly normal bilaterally.  Motor strength is 5/5 in her bilateral deltoids, biceps, triceps, hand grip, wrist extensor, inner osseus, psoas, quadriceps, gastrocnemius, extensor hallucis longus, although she does give away in her left lower extremity diffusely.  Sensory exam demonstrates slightly decreased sensation in the left lateral leg to pinprick; otherwise unremarkable.  Deep  tendon reflexes are 1/4 in bilateral biceps, triceps, brachioradialis, quadriceps, gastrocnemius.  She has a bilateral flexor plantar reflexes.  No active clonus.  IMAGING STUDIES:  Patient has a lumbar MRI performed at [facility] on June 09, 2000 which demonstrates degenerative disk disease and herniated nucleus pulposus L3-4, L4-5, L5-S1 and a discogram performed at  [facility] which demonstrates ______ at L3-4, L4-5, and L5-S1.  ASSESSMENT AND PLAN:  L3-4, L4-5, L5-S1 degenerative disk disease, spondylosis, lumbago, lumbar radiculopathy.  I have discussed the situation extensively with the patient and her daughter and reviewed her MRI scan and discogram with them.  I discussed the various treatment options with her, including doing nothing, continuing medical management, and surgery.  I have described the procedure of an L3-4, L4-5, and L5-S1 posterior lumbar interbody fusion with insertion of bone dowels, pedicle screws and rods.  I have shown her surgical models.  I have discussed the risks of surgery extensively. Patient has weighed the risks, benefits, and alternatives of surgery and wants to proceed with lumbar decompression and fusion on February 13, 2001. DD:  02/13/01 TD:  02/14/01 Job: [id number]

## 2011-04-16 NOTE — Op Note (Signed)
NAME:  Alexa [name], Alexa [name]              ACCOUNT NO.:  [id number]   MEDICAL RECORD NO.:  [id number]          PATIENT TYPE:  INP   LOCATION:  3010                         FACILITY:  [facility]   PHYSICIAN:  [name], M.D.DATE OF BIRTH:  01/05/1936   DATE OF PROCEDURE:  04/12/2007  DATE OF DISCHARGE:                               OPERATIVE REPORT   BRIEF HISTORY:  The patient is a 27 white female who I performed a C5-6  anterior cervical diskectomy and fusion plating on several years ago.  She did well.  More recently she developed recurrent neck and arm pain  and has failed medical management.  She is worked up with a cervical MRI  which demonstrated the patient had cervical stenosis, spondylosis, etc.  at C4-5 and C6-7.  I discussed the various treatment with the patient  including surgery.  The patient has weighed the risks, benefits and  alternatives of surgery and will proceed with C4-5 and C6-7 anterior  cervical diskectomy and fusion plating.   PREOPERATIVE DIAGNOSES:  C4-5 and C-6-7 degenerative disease,  spondylosis, stenosis, cervical myelopathy, cervical radiculopathy.   POSTOPERATIVE DIAGNOSES:  C4-5 and C-6-7 degenerative disease,  spondylosis, stenosis, cervical myelopathy, cervical radiculopathy.   PROCEDURE:  C4-5 and C6-7 extensive anterior cervical  diskectomy/decompression; C4-5 and C6-7 interbody local autograft  arthrodesis; C4-5 and C6-7 insertion of interbody prosthesis (Alphatec  PEEK interbody prosthesis); anterior cervical plating C4-5 and C6-7 with  Codman slim titanium plate and screws.   SURGEON:  Dr. Delma Officer   ASSISTANT:  Dr. Trey Sailors.   ANESTHESIA:  General endotracheal.   ESTIMATED BLOOD LOSS:  150 mL.   SPECIMENS:  None.   DRAINS:  None.   COMPLICATIONS:  None.   PROCEDURE:  The patient was brought to the operating room by anesthesia  team.  General endotracheal anesthesia was induced.  The patient  remained in supine position.  A  roll was placed under her shoulders, I  placed her neck in slight extension.  Her anterior cervical region was  then prepared with Betadine scrub and Betadine solution.  Sterile drapes  were applied.  I then injected the area to be incised with Marcaine with  epinephrine solution and used a scalpel to make a transverse incision  through the patient's previous surgical scar.  I used the Metzenbaum  scissors to divide the platysma muscle and then to dissect medial to the  sternocleidomastoid muscle, jugular vein and carotid artery.  I  carefully dissected through the scar tissue.  We identified the  esophagus and retracted it medially.  We used Kitner swabs to clear soft  tissue from the anterior cervical spine.  We encountered some scarring  over the plate as expected.  I encountered quite a bit of ventral  spondylosis.  We obtained intraoperative radiograph to confirm our  location.  I then incised the scar tissue over the plate with a 15 blade  scalpel and we removed it with the osteophyte tool, etc. and we noted  that old plate at Z6-1 was largely covered over with spondylosis both  cephalad and caudally.  In  order to get to the screws we used a high-  speed drill to drill away the spondylosis to expose the screw heads.  We  then unlocked the inner screws, removed the screws and then removed the  plate.   We then turned attention to the decompression.  We began at C4-5.  I  used electrocautery to detach the medial border off the longus colli  muscle bilaterally from C4-5 intervertebral disk space.  We inserted a  Caspar self-retaining retractor for exposure.  I then used a high-speed  drill to remove the ventral spondylosis at C4-5 to gain access to disk  space.  We then incised C4-5 intervertebral disk, performed a partial  diskectomy using pituitary forceps and inserted distraction screws at C4-  C5, distracted interspace and then used a high-speed drill to  decorticate the vertebral  end plates at E4-5, drill away the remainder  of C4-5 intervertebral disk to drill away some posterior spondylosis and  to thin out the posterior longitudinal ligament.  We then incised the  ligament with arachnoid knife and then removed it with a Kerrison punch  undercutting the vertebral end plates at W0-9, decompressing the thecal  sac.  We then performed a foraminotomy about the bilateral C5 nerve  root, completing the decompression at this level.   We then repeated this procedure in analogous fashion at C6-7,  decompressing the thecal sac at this level as well as the bilateral C-7  nerve roots and again we encountered quite a bit of ventral spondylosis  which we drilled away.   We now turned attention to arthrodesis.  We used the trial spacers and  determined to use a 5 mm small interbody Alphatec PEEK prosthesis at C4-  5.  We prefilled this spacer/prosthesis with local autograft bone graft  we obtained during decompression as well as B type bone graft extender.  We inserted the prosthesis in the distracted interspace and then removed  distraction screws.  There was good snug fit of the prosthesis.  We then  repeated the procedure in analogous fashion at C6-7.  At this level we  used an 8 mm PEEK interbody prosthesis which we prefilled with local  autograft bone and B-type completing the arthrodesis insertion of  prosthesis.   We now turned attention to anterior spinal instrumentation.  We drilled  over and removed some ventral spondylosis from the vertebral bone and  placed at C4-5 and C6-7 so the plate would lay down flat.  We selected  appropriate length Codman slim lock anterior cervical plate and laid it  along the anterior aspect of vertebral bodies at C4-5 and C6-7.  We then  used the old holes at C-5.  We drilled two new holes at C4 and then  secured the plate and W1-1 with two 12 member self-tapping screws at C4 and C5.  We then locked the cams.   We then repeated this  procedure in an analogous fashion at C6-7 placing  a plate and four 12 mm self-tapping screws.  We obtained intraoperative  radiograph.  It demonstrated good position of the upper plate and  screws.  We could not see the lower plate and screws very well because  of the patient's body habitus but they look good in vivo.  We therefore  secured the screws to the cam.  We then irrigated the wound out with  bacitracin solution, removed the retractor.  We then inspected the  esophagus for any damage.  There was none apparent.  We then obtained  hemostasis using bipolar electrocautery.  We then reapproximated the  patient's platysma muscle with interrupted 3-0 Vicryl suture,  subcutaneous tissue with interrupted 3-0 Vicryl suture and skin with  Steri-Strips and Benzoin.  The wound was then coated with bacitracin  ointment.  A sterile dressing was applied.  The drapes were removed.  The patient was subsequently extubated by anesthesia team and  transported to the post anesthesia care unit in stable condition.  All  sponge, instrument and needle counts were correct at the end of this  case.      [name], M.D.    JDJ/MEDQ  D:  04/12/2007  T:  04/13/2007  Job:  [id number]

## 2011-04-16 NOTE — Op Note (Signed)
NAME:  Alexa [name], Alexa [name]                        ACCOUNT NO.:  [id number]   MEDICAL RECORD NO.:  [id number]                   PATIENT TYPE:  INP   LOCATION:  3021                                 FACILITY:  [facility]   PHYSICIAN:  [name], M.D.            DATE OF BIRTH:  [birth year]   DATE OF PROCEDURE:  07/25/2003  DATE OF DISCHARGE:  07/26/2003                                 OPERATIVE REPORT   PREOPERATIVE DIAGNOSES:  Left C5-6 herniated nucleus pulposus, degenerative  disk disease, spondylosis, stenosis, cervical radiculopathy, cervicalgia.   POSTOPERATIVE DIAGNOSES:  Left C5-6 herniated nucleus pulposus, degenerative  disk disease, spondylosis, stenosis, cervical radiculopathy, cervicalgia.   PROCEDURES:  1. C5-6 extensive anterior cervical diskectomy/decompression.  2. Interbody iliac crest allograft arthrodesis.  3. Anterior cervical plating (Synthes titanium plate and screws).   SURGEON:  [name], M.D.   ASSISTANT:  Clydene Fake, M.D.   ANESTHESIA:  General endotracheal.   ESTIMATED BLOOD LOSS:  50 mL.   SPECIMENS:  None.   DRAINS:  None.   COMPLICATIONS:  None.   BRIEF HISTORY:  The patient is [name] 75 year old white female who has suffered  from months of neck and severe left arm pain.  She had failed medical  management and was worked up with [name] cervical MRI, which demonstrated [name] large  herniated disk at C5-6 on the left.  The patient's signs, symptoms, and  physical exam were consistent with [name] left C6 radiculopathy.  I discussed the  various treatment options with her and her family.  The patient weighed the  risks, benefits, and alternatives of surgery and decided to proceed with [name]  C5-6 anterior cervical diskectomy and fusion and plating.   DESCRIPTION OF PROCEDURE:  The patient was brought tot he operating room by  the anesthesia team and general anesthesia was induced.  The patient  remained in [name] supine position.  [name] roll was placed under  her shoulders to  place her neck in slight extension.  Her anterior cervical region was then  prepared with Betadine scrub and Betadine solution.  Sterile drapes were  applied.  I then injected the area to be incised with Marcaine with  epinephrine solution and used [name] scalpel to make [name] transverse incision in the  patient's left anterior neck.  I used the Metzenbaum scissors to divide the  platysma muscle and then to dissect medial to the sternocleidomastoid  muscle, jugular vein, and carotid artery.  I bluntly dissected down toward  the anterior cervical spine and carefully identified the esophagus and  retracted it medially.  I then used Kitner swabs to clear the soft tissue  from the anterior cervical spine and inserted [name] bent spinal needle at the  upper exposed interspace.  I obtained the intraoperative radiograph to  confirm our location.   We then used the electrocautery to detach the medial border off the longus  colli muscle bilaterally  from the C5-6 intervertebral disk space.  We  inserted the Caspar self-retaining retractor for exposure and then incised  the C5-6 intervertebral disk with [name] 15 blade scalpel.  We then performed  partial diskectomy using the pituitary forceps and the Karlin curettes.  We  then inserted the distraction screws at C5 and C6 and distracted the  interspace.  We then used the high-speed drill to decorticate the vertebral  end plates at C5 and C6 and to drill away the remainder of the C5-6  intervertebral disk and to thin out the posterior longitudinal ligament.  We  then incised the ligament with the arachnoid knife and then removed it with  the Kerrison punch, undercutting the vertebral end plates at U9-8 and  decompressing the thecal sac.  We then performed [name] foraminotomy about the  bilateral C6 nerve roots.  Of note, there was [name] large herniated disk over  the left compressing the left C6 nerve root.  We got [name] good decompression  bilaterally.   We now  turned our attention to the arthrodesis.  We obtained iliac crest  tricortical allograft bone graft and fashioned it to these approximate  dimensions, 7 mm in height and 1 cm in depth.  We inserted the bone graft  into the distracted C5-6 interspace and then removed the distraction screws.  There was [name] good snug fit of bone graft.   We now turned our attention to the anterior spinal instrumentation.  We  drilled some ventral spondylosis from the C5-6 end plates so that the plate  would lay down flat.  We then selected the appropriate size Synthes anterior  cervical plate.  We laid it along the anterior aspect of the vertebral  bodies from C5 and C6.  We drilled two holes at C5, two at C6, tapped the  holes, and then secured the plate to the vertebral body by placing two 14 mm  screws at C5 and two at C6.  We obtained the intraoperative radiograph.  There was limited visualization secondary to the patient's body habitus, but  they looked to be in good position in vivo.  We then secured the screws to  the plate using the locking screw at each screw.  We then achieved stringent  hemostasis using bipolar electrocautery.  We copiously irrigated the wound  out with bacitracin solution.  We then removed the solution, then removed  the Caspar self-retaining retractor.  We inspected the esophagus for any  damage.  There was none apparent.  We then reapproximated the patient's  platysma muscle with interrupted 3-0 Vicryl suture, the subcutaneous tissue  using inverted interrupted 3-0 Vicryl suture, and the skin with Steri-Strips  and Benzoin.  The wound was then coated with bacitracin ointment, [name] sterile  dressing was applied, and the drapes were removed.  The patient was  subsequently extubated by the anesthesia team and transported to the  postanesthesia care unit in stable condition.  All sponge, instrument, and needle counts were correct at the end of this case.                                                 [name], M.D.    JDJ/MEDQ  D:  07/25/2003  T:  07/26/2003  Job:  [id number]

## 2011-04-16 NOTE — Op Note (Signed)
[place]  Patient:    Alexa [name], Alexa [name]                     MRN: [id number] Adm. Date:  [id number] Attending:  Tressie Stalker D                           Operative Report  I reapproximated the patients thoracolumbar fascia with interrupted #1 Vicryl, subcutaneous tissue with interrupted 3-0 Vicryl suture, and the skin with Steri-Strips and benzoin.  The wound was then coated with bacitracin ointment, a sterile dressing applied.  The drapes were removed, and the patient was subsequently returned to the supine position, where she was extubated by the anesthesia team and transported to the postanesthesia care unit in stable condition.  All sponge, instrument, and needle counts were correct at the end of this case. DD:  02/13/01 TD:  02/14/01 Job: [id number]

## 2011-04-16 NOTE — Discharge Summary (Signed)
[facility]  Patient:    Alexa [name], Alexa [name]                     MRN: [id number] Adm. Date:  [id number] Disc. Date: [id number] Attending:  Feliciana Rossetti                           Discharge Summary  DISCHARGE DIAGNOSES:  Pneumonia, lobar, criterial hypovolemia, hypercholesterolemia, coronary artery disease.  Chronic obstructive pulmonary disease.  REASON FOR HOSPITALIZATION:  Pneumonia with chronic obstructive pulmonary disease exacerbation.  HOSPITAL COURSE:  The patient was admitted to a general medical bed.  Her O2 saturations initially had ranged from 92% to 94% on room air as her examination demonstrated bilateral expiratory wheeze, severe nonproductive cough, decreased air movement into the left lung and crackles throughout the left lung field.  Intermittently, she had become tachycardic into the 100-110 range.  The patient was able to ambulate only 10 feet while becoming profoundly short of breath and having to sit down while describing lightheadedness.  She was begun on q.4h. and q.2h. p.r.n. bronchodilator therapy, nebulized.  By day #2 of hospitalization she had stabilized though was unimproved and indeed she had become striderous intermittently with minimal exertion.  Intravenous antibiotics and bronchodilator therapy was continued.  By day #3 of hospitalization she stated she felt much better.  Her O2 saturation was 96% on room air.  She was able to ambulate into the halls with only minimal to moderate shortness of breath.  Her chest x-ray had improved from the chest x-ray taken on the day of admission.  Regarding her volume status, gentle intravenous hydration and oral intake normalized her volume state.  CONDITION AT DISCHARGE:  The patient was well appearing with no respiratory distress.  Her lung examination was improved.  Her vital signs had normalized and stabilized.  DISCHARGE MEDICATIONS: 1. Levaquin 500 p.o. q.d. for a total of 10  days. 2. Darvocet p.r.n. pain for her osteoarthritis. 3. Albuterol and ipratropium MDI q.4h. and as necessary. 4. An expectorant and decongestant was added to her regimen. 5. Her Lasix was held until followup, which is one-week followed discharge    with Dr. Quintella Reichert. DD:  03/25/00 TD:  03/25/00 Job: [id number]

## 2011-04-16 NOTE — H&P (Signed)
[facility]  Patient:    Alexa [name], Alexa [name]                     MRN: [id number] Adm. Date:  [id number] Attending:  Feliciana Rossetti                         History and Physical  CHIEF COMPLAINT:  Shortness of breath.  HISTORY OF PRESENT ILLNESS:  [age]-year-old white female with shortness of breath, progressing rapidly over 72 hours, for which she was treated with intramuscular Rocephin for presumed pneumonia 24 hours prior to admission. This day of admission, she presented to my office where radiographs revealed left lobar pneumonia.  In the office, she was ill-appearing, breathing 36 times a minute, ith labored breaths with audible wheeze upon expiration.  For this, she was sent directly to [facility].  Patient described fever and shaking chills the day of admission.  She states she has had no oral intake x 48 hours.  She had decreased urine output.  She described malaise and lightheadedness.  She denies  nausea, vomiting, dysuria or diarrhea.  PAST MEDICAL HISTORY:  Rotator cuff tear, edema, hypercholesterolemia, history f tobacco abuse.  PAST SURGICAL HISTORY:  Hysterectomy, rotator cuff repair, hemorrhoidectomy, vein stripping.  ALLERGIES:  PENICILLIN; NSAIDs.  MEDICINES: 1. Darvocet-N p.r.n. 2. Zocor 10 mg q.d. 3. Aspirin one q.d. 4. Lasix 20 mg q.d. p.r.n.  FAMILY HISTORY:  Positive history of early myocardial infarction and cerebrovascular accident.  REVIEW OF SYSTEMS:  No rash.  No skin breakdown.  No incontinence.  No syncope. No palpitations.  No chest pain.  Cough is present that is productive of green sputum. No hemoptysis.  No hematochezia.  PHYSICAL EXAMINATION:  GENERAL:  Ill-appearing, in mild respiratory distress.  VITAL SIGNS:  Respiratory rate is 32 and labored.  Heart rate is 106, by me. Blood pressure is 128/78.  O2 saturation is 94% on room air.  Temperature is 100, orally.  HEENT:  Pupils are  equal, round and reactive to light.  Funduscopic exam reveals normal disc edges, no exudate or hemorrhage.  Extraocular movements are intact.  Oropharynx is without lesions and mucous membranes are tacky.  NECK:  Supple.  Lymph node survey is negative.  No carotid bruit.  CARDIAC:  Tachycardic with regular rhythm.  No murmur, rub or gallop.  LUNGS:  Bilateral expiratory wheeze.  Inspiratory/expiratory ratio is diminished. Decreased air movement in left base; crackles, left lung field.  ABDOMEN:  Soft.  No hepatosplenomegaly.  No rebound.  No guard.  Normal bowel sounds.  EXTREMITIES:  No clubbing, cyanosis, or edema.  NEUROLOGIC:  II-XII and muscle strength within normal limits.  PELVIC:  Deferred.  RECTAL:  Deferred.  DATA:  Chest x-ray reveals left lobar infiltrate consistent with pneumonia.  ASSESSMENT AND PLAN: 1. Pneumonia:  Levaquin 500 mg intravenously q.d.  Albuterol and ipratropium    nebulized p.r.n. 2. Edema:  Hold Lasix now, given objective evidence of dehydration. 3. Hypercholesterolemia:  Continue Zocor 10 mg q.d. 4. Coronary artery disease:  ASA q.d. DD:  03/23/00 TD:  03/24/00 Job: [id number]

## 2011-04-16 NOTE — Discharge Summary (Signed)
NAME:  Alexa [name], Alexa [name]              ACCOUNT NO.:  [id number]   MEDICAL RECORD NO.:  [id number]          PATIENT TYPE:  INP   LOCATION:  3306                         FACILITY:  [facility]   PHYSICIAN:  [name], M.D.DATE OF BIRTH:  1936/08/30   DATE OF ADMISSION:  09/16/2008  DATE OF DISCHARGE:  09/20/2008                               DISCHARGE SUMMARY   BRIEF HISTORY:  The patient is a 75 year old white female who has  undergone multiple previous back surgeries.  I performed 2 prior fusions  on her, one from L3-S1 and a subsequent extension fusion up to L2-3.  The patient has had chronic back pain but gently has been doing well  with medical management until several months ago when she developed  increasing back and leg pain.  She failed medical management, worked up  with a lumbar MRI, which demonstrated she had developed severe adjacent  segment degenerative changes and stenosis at L1-2.  I discussed various  treatment options with the patient and family including surgery.  The  patient has weighed the risks, benefits, and alternatives of surgery and  decided to proceed with an L1-2 decompression with extension of her  fusion up to lower thoracic region as well as an exploration of a lumbar  fusion.   For further details of this admission, please refer to typed history and  physical.   HOSPITAL COURSE:  Admitted the patient to the [facility] on  September 16, 2008.  On the day of admission, I performed exploration of a  lumbar fusion with L1-2 fusion and the surgery went well (for full  details of this operation, please refer to typed operative note).   POSTOPERATIVE COURSE:  The patient's postoperative course was  unremarkable.  We had PT and OT see the patient.  We had the rehab team  to see the patient, and they felt she was appropriate for inpatient  rehab.  She was transferred to inpatient rehab on September 20, 2008.   DISCHARGE INSTRUCTIONS:  The patient was  given written discharge  structures to follow up with me in 4 weeks.   FINAL DIAGNOSES:  L1-2 degenerative disease, spinal stenosis, lumbar  radiculopathy, myelopathy, lumbago.   PROCEDURES PERFORMED:  Exploration of the lumbar fusion; decompressive  laminectomy at L1-2, decompression bilateral at L1 as well as L2 nerve  roots; left L1-2 transforaminal lumbar interbody fusion with local  morselized autograft bone, Actifuse/Vitoss bone graft extender;  insertion of L1-2 interbody prosthesis (PEEK interbody prosthesis);  posterior segmental fixation T10-L4 with Legacy titanium pedicle screws  and rods; T10-11, 11-12, 12-1, and L1-2 posterolateral arthrodesis with  local morselized autograft bone and Actifuse/Vitoss bone graft  extenders.      [name], M.D.  Electronically Signed     JDJ/MEDQ  D:  10/31/2008  T:  11/01/2008  Job:  [id number]

## 2011-04-16 NOTE — Discharge Summary (Signed)
[place]  Patient:    Alexa [name], Alexa [name]                     MRN: [id number] Adm. Date:  [id number] Disc. Date: [id number] Attending:  Carson Myrtle                           Discharge Summary  For full details of this admission, please refer to the typed history and physical.  BRIEF HISTORY:  The patient is a 75 year old white female who has suffered from intractable back and leg pain which has failed extensive nonsurgical management, including rest, physical therapy, steroid injections, etc.  She was worked up with a lumbar MRI and then a lumbar discogram, which demonstrated significant disk pathology at L3-4, L4-5, and L5-S1.  I discussed the various treatment options with the patient, including doing nothing, continued medical management, and surgery.  The patient has weighed the risks, benefits, and alternatives of surgery and decided to proceed with a lumbar decompression, stabilization, and fusion as she has failed medical management.  For past medical history, past surgical history, medications prior to admission, drug allergies, family medical history, social history, admission physical exam, imaging studies, assessment and plan, etc., please refer to the typed history and physical.  HOSPITAL COURSE:  I admitted the patient to [facility] on February 13, 2001, and performed an L3-4, L4-5, and L5-S1 posterior lumbar interbody fusion with Synthes TLIF cortical bone dowels, posterior segment instrumentation of L3-S1 bilaterally with insertion of Danek TSRA titanium pedicle screws and rods, posterior arthrodesis with local morcellized autograft bone, cancellous autograft bone, and Grafton putty.  The surgery went well without complications.  (For full details of this operation, please refer to the typed operative note.)  POSTOPERATIVE COURSE:  The patients postoperative course was as follows:  She was initially  maintained on a morphine PCA pump, which she was continued on until postoperative day #2.  On postoperative day #3, her Foley catheter and PCA were discontinued.  A physical therapy consult was obtained.  The patient had a fever to 101.8 degrees.  This was worked up with a chest x-ray which demonstrated some probable atelectasis.  The patient had some hypoxia and a pulmonary consult was obtained with Onalee Hua B. Sung Amabile, M.D.  He recommended pulmonary management.  The patient did also have some urinary retention which resolved.  By postoperative day #7, i.e. February 20, 2001, the patient was afebrile, her vital signs were stable, her wound was healing well without signs of infection, and she was felt to be stable for transfer to the inpatient rehabilitation unit.  This was done on February 20, 2001.  FINAL DIAGNOSES: 1. L3-4, L4-5, and L5-S1 degenerative disk disease and spondylosis. 2. Lumbago. 3. Lumbar radiculopathy. 4. Urinary retention. 5. Pulmonary atelectasis.  PROCEDURES PERFORMED:  L3-4, L4-5, and L5-S1 posterior lumbar interbody fusion with insertion of Synthes TLIF cortical bone dowels, posterior segmental instrumentation of L3-S1 with Danek TRSA titanium pedicle screws and rods, posterolateral arthrodesis with localized morcellized autograft bone and cancellous allograft bone, and Grafton putty. DD:  06/15/01 TD:  06/18/01 Job: [id number]

## 2011-04-16 NOTE — Op Note (Signed)
NAME:  Alexa [name], Alexa [name]              ACCOUNT NO.:  [id number]   MEDICAL RECORD NO.:  [id number]          PATIENT TYPE:  INP   LOCATION:  3030                         FACILITY:  [facility]   PHYSICIAN:  [name], M.D.DATE OF BIRTH:  05-10-1936   DATE OF PROCEDURE:  11/02/2004  DATE OF DISCHARGE:                                 OPERATIVE REPORT   PREOPERATIVE DIAGNOSIS:  L2-3 degenerative disk disease, spondylosis,  stenosis, lumbago, lumbar radiculopathy.   POSTOPERATIVE DIAGNOSIS:  L2-3 degenerative disk disease, spondylosis,  stenosis, lumbago, lumbar radiculopathy.   OPERATION PERFORMED:  L2-3 posterior lumbar interbody fusion L2-3 placement  of bilateral L2-3 interbody prosthesis (Capstone Peak cages); a posterior  segmental instrumentation, L2-3 with TSRH titanium pedicles screws and rods,  posterolateral arthrodesis L2-3 with local morcellized autograft bone and  Vitoss bone scaffolding.  Redo L3 laminectomy.   SURGEON:  [name], M.D.   ASSISTANT:   ANESTHESIA:  General endotracheal.   DRAINS:  None.   COMPLICATIONS:  None.   INDICATIONS FOR PROCEDURE:  The patient is a 75 year old white female for  whom I performed and L3-4, L4-5 and 5-1 lumbar fusion about three years ago.  The patient did well but over the last six months or so has developed  increasing back and leg pain with symptoms consistent with neurogenic  claudication.  I worked her up with a lumbar myelo-CT which demonstrated the  patient had developed adjacent segment degenerative changes at L2-3 with  significant degenerative disk disease and spinal stenosis.  I discussed the  various treatment options with the patient including surgery after she  failed medical management.  The patient weighed the risks, benefits and  alternatives to surgery and decided to proceed with L2-3 fusion.   DESCRIPTION OF PROCEDURE:  The patient was brought to the operating room by  the anesthesia team.  General  endotracheal anesthesia was induced.  The  patient was then turned to the prone position on the Wilson frame.  The  lumbosacral region was then prepared with Betadine scrub and Betadine  solution and sterile drapes were applied.  I then injected the area to be  incised with Marcaine with epinephrine solution.  I used a scalpel to  ellipse out the patient's prior surgical scar and then to extend it in a  cephalad direction.  I used electrocautery to perform a subperiosteal  dissection exposing the spinous process and lamina of L1, 2, 3, as well as  the facets at L3-4, 4-5 and the upper sacrum.  I then exposed the previous  TSRH instrumentation.  We removed the nuts.  Even after removing the nuts,  cross connector, we were unable to remove it, so we had to use a cutting bur  to cut it in half in the midline and then remove the two separate halves.  We then removed the rod and the connectors leaving the pedicle screws in  place.  Also the patient appeared to have good bony fusion posterolaterally  from L3 down to the upper sacrum.  At L2-3 there was significant facet  arthropathy.  I should mention that  we used cerebellar retractor for  exposure.  We incised the L1-2 and 2-3 interspinous ligament with the  scalpel and then removed the L2 and L3 spinous process using the Leksell  rongeurs.  We saved this bone and later used it in the fusion process after  we cleared the soft tissue.  We then used a high speed drill to perform a  bilateral L2 laminotomy.  We completed the L2 laminectomy with the Kerrison  punch and removed the L1-2 and 2-3 ligamentum flavum and we removed the  cephalad aspect of the L3 lamina.  We then performed a foraminotomy at the  bilateral L2 and L3 nerve roots.  We decompressed the thecal sac.  There was  quite a bit of facet ligamentum over this level.  We then carefully  retracted neural structures medially and then with a D'Errico retractor and  then performed a L,  incised the L2-3 intervertebral disk with a 15 blade  scalpel and performed the rest of the diskectomy using pituitary forceps and  the Epstein and Scoville curettes.  We did this bilaterally.  We then  prepared the vertebral end plates at Z6-1 for the fusion by distracting the  contralateral  disk space with interbody spreader.  We then cleared this  lateral disk space of soft tissues using curettes.  We then placed a 10 x 26  mm prefilled Capstone PEAK cage into the disk space of course after  retracting neural structures out of harm's way.  We then removed the disk  spreader from the contralateral disk space and placed another Capstone cage  on that side.  We filled in between and in the cages with local morcellized  autograft bone and Vitoss bone scaffolding completing the posterior lumbar  interbody fusion.   We then turned our attention to the instrumentation.  We used electrocautery  to expose the bilateral transverse processes of L2 and then under  fluoroscopic guidance we cannulated bilateral L2 pedicles with the bone  probe.  I then tapped the pedicles with a 4.5 mm tap.  We probed this down  to pedicles and noted there were no cortical breaches.  We then placed a 5.5  x 40 mm pedicle screws bilaterally at L2 under fluoroscopic guidance.  We  then palpated along the medial aspect of the L2 pedicles and noted that  there were no cortical breeches and the L2 nerve roots were not injured.  We  then cut a rod to the appropriate length.  We then placed appropriate  connectors and connected the rod to the pedicle screws.  We compressed the  L2-3 interspace and then connected the rods, torqued them appropriately.  There was a good construct.  We placed a cross connector.  This completed  the instrumentation.   We now turned our attention to posterolateral arthrodesis.  We used a high speed drill to decorticate the remainder of the L2-3 facet and the L2  transverse process and the L3  fusion mass.  I then laid a combination of  local morcellized autograft bone and Vitoss bone scaffolding over these  decorticated posterolateral structures, completing the posterolateral  arthrodesis.  We then inspected the thecal sac and noted that the L2 and L3  nerve roots were well decompressed.  I should mention that in the process of  clearing some of the soft tissue, I did create a small durotomy.  I closed  the durotomy with a figure-of-eight Nurolon suture and laid some Tisseel  over the  durotomy closure.  There was no more spinal fluid leak.   We then obtained stringent hemostasis with bipolar electrocautery and then  removed the cerebellar retractor.  We reapproximated the patient's  thoracolumbar fascia with interrupted #1 Vicryl sutures, subcutaneous tissue  with interrupted 2-0 Vicryl suture and the skin with Steri-Strips and  benzoin. The wound was then coated with bacitracin ointment, sterile  dressing was applied, the drapes were removed.  The patient was subsequently  returned to supine position where she was extubated by the anesthesia team  and transported to the post anesthesia care unit in stable condition.  All  sponge, needle and instrument counts were correct at the end of the case.       JDJ/MEDQ  D:  11/02/2004  T:  11/03/2004  Job:  [id number]

## 2011-04-16 NOTE — Discharge Summary (Signed)
NAME:  Alexa [name], Alexa [name]              ACCOUNT NO.:  [id number]   MEDICAL RECORD NO.:  [id number]          PATIENT TYPE:  INP   LOCATION:  3030                         FACILITY:  [facility]   PHYSICIAN:  [name], M.D.DATE OF BIRTH:  04-30-1936   DATE OF ADMISSION:  11/02/2004  DATE OF DISCHARGE:  11/06/2004                                 DISCHARGE SUMMARY   COMMENT:  For full details of this admission, please refer to typed history  and physical.   BRIEF HISTORY:  The patient is a 75 year old white female who I performed an  L3-4, 4-5 and 5-1 lumbar fusion on about 3 years ago and patient did well,  but over the last 6 months or so, has developed increasing back and leg pain  consistent with neurogenic claudication.  I worked her up with lumbar myelo-  CT which demonstrated the patient had developed significant adjacent segment  degenerative changes at L2-3 with degenerative disease and spinal stenosis.  I discussed the various treatment options with the patient and her family  including surgery, and the patient has weighed the risks, benefits and  alternatives to surgery and has decided to proceed with an L2-3 fusion.   For further details of this admission, please refer to the history and  physical.   HOSPITAL COURSE:  I admitted the patient to [facility] on November 02, 2004; on that day, I performed L2-3 fusion and the surgery went well (for  full details of this operation, please refer to typed operative note).   POSTOPERATIVE COURSE:  The patient's postoperative course was remarkable  only as follows:  She did develop some urinary retention and had to have I&O  catheterization.  She had some fevers up to 101.7.  I checked a UA, C&S,  blood cultures, chest x-ray, etc.  The patient's white blood count was  mildly elevated at 12.6.  Her urinalysis did not seem to indicate infection.  Her urine cultures developed multiple species consistent with a  contamination.   Her fevers broke.   By November 06, 2004, the patient was afebrile, her vital signs were stable,  she was eating well and ambulating well, her wound was healing well without  signs of infection, and she was requesting discharge to home; I therefore  discharged her home on November 06, 2004.   DISCHARGE INSTRUCTIONS:  The patient was instructed to follow with me in 4  weeks, given written discharge instructions and instructed to resume her  outpatient medical regimen.   DISCHARGE PRESCRIPTIONS:  1.  Avinza 16 mg, #30, one p.o. daily.  2.  Valium 5 mg, #50, one p.o. q.8 h. p.r.n. for muscle spasm.  3.  Percocet 10/325 mg, #100, one p.o. q.4 h. for breakthrough pain.   FINAL DIAGNOSES:  1.  L2-3 degenerative disease, spondylosis, stenosis,  2.  Lumbago.  3.  Lumbar radiculopathy.   PROCEDURE PERFORMED:  1.  L2-3 posterior lumbar interbody fusion.  2.  Placement of bilateral L2-3 interbody prostheses (caps and PEEK cages).  3.  Posterior segmental instrumentation, L2-3, with TSRH pedicle screws and  rods.  4.  Posterolateral arthrodesis, L2-3, with local morcelized autograft bone      and VITOSS bone scaffolding.  5.  Redo L3 laminectomy.      JDJ/MEDQ  D:  12/24/2004  T:  12/25/2004  Job:  [id number]

## 2011-04-26 ENCOUNTER — Other Ambulatory Visit: Payer: Self-pay | Admitting: Family Medicine

## 2011-04-27 ENCOUNTER — Other Ambulatory Visit: Payer: Self-pay

## 2011-06-10 ENCOUNTER — Encounter (INDEPENDENT_AMBULATORY_CARE_PROVIDER_SITE_OTHER): Payer: Medicare Other | Admitting: Cardiothoracic Surgery

## 2011-06-10 ENCOUNTER — Ambulatory Visit
Admission: RE | Admit: 2011-06-10 | Discharge: 2011-06-10 | Disposition: A | Discharge status: Home / Self Care | Payer: Medicare Other | Source: Ambulatory Visit | Attending: Cardiothoracic Surgery | Admitting: Cardiothoracic Surgery

## 2011-06-10 DIAGNOSIS — D491 Neoplasm of unspecified behavior of respiratory system: Secondary | ICD-10-CM

## 2011-06-10 DIAGNOSIS — R911 Solitary pulmonary nodule: Secondary | ICD-10-CM

## 2011-06-10 NOTE — Assessment & Plan Note (Signed)
OFFICE VISIT  Alexa [name], Alexa [name] DOB:  30-Apr-1936                                        June 10, 2011 CHART #:  [id number]  The patient returns to the office with [name] followup CT scan.  I followed her since February of 2011, when she was referred by Dr. Tressie Stalker with [name] question of [name] right lung mass.  In going back in reviewing CT scans and March 2009 until today, she has [name] right lung mass that appears unchanged.  [name] PET scan was performed in March of 2011.  There was no appreciable uptake in the right lung mass compared to background.  At that time, she had pulmonary function studies with FEV-1 of 1.7 and diffusion capacity of 93%.  In October of 2011, ENB with transbronchial biopsies were attempted and all the pathology showed no malignant cells because of the question of possible bronchoalveolar carcinoma in spite of these findings, I have continued to follow the patient and she returned today for [name] CT scan.  Since seen 6 months ago, the patient notes more shortness of breath with exertion with some wheezing.  She denies any anginal type symptoms and has had some increasing pedal edema.  On exam, her blood pressure 127/63, pulse is 78, respiratory rate is 18, O2 sats 96% on room air.  I do not appreciate any cervical or supraclavicular adenopathy.  Her lungs are clear bilaterally.  Abdominal exam is benign without palpable masses.  She has bilateral pedal edema 2+.  Her current medications include Diovan, levothyroxine, Nexium, Lasix p.r.n., K-Lor p.r.n., Percocet are reviewed.  Followup CT scan compared to the previous scans including back to 2009, showed unchanged right lung mass.  The patient notes that she is to see Dr. Scotty Court in the very near future.  I am concerned about her increasing shortness of breath over the past 6 months.  She will discuss with Dr. Scotty Court referrals within the [facility] to Cardiology and/or Pulmonology for  further evaluation.  I will leave these referrals up to Dr. Scotty Court and as the patient notes that she is to see him for regular followup very soon.  I will see her back in 3 months and then decide on subsequent followup scans to continue to evaluate the right lung mass.  Sheliah Plane, MD Electronically Signed  EG/MEDQ  D:  06/10/2011  T:  06/10/2011  Job:  [id number]  cc:   Ellin Saba., MD

## 2011-06-16 ENCOUNTER — Ambulatory Visit (INDEPENDENT_AMBULATORY_CARE_PROVIDER_SITE_OTHER): Payer: Medicare Other | Admitting: Family Medicine

## 2011-06-16 ENCOUNTER — Encounter: Payer: Self-pay | Admitting: Family Medicine

## 2011-06-16 VITALS — BP 112/70 | HR 87 | Temp 98.6°F | Wt 201.0 lb

## 2011-06-16 DIAGNOSIS — G8929 Other chronic pain: Secondary | ICD-10-CM

## 2011-06-16 DIAGNOSIS — R5383 Other fatigue: Secondary | ICD-10-CM

## 2011-06-16 DIAGNOSIS — M549 Dorsalgia, unspecified: Secondary | ICD-10-CM

## 2011-06-16 DIAGNOSIS — C50919 Malignant neoplasm of unspecified site of unspecified female breast: Secondary | ICD-10-CM

## 2011-06-16 DIAGNOSIS — R0602 Shortness of breath: Secondary | ICD-10-CM

## 2011-06-16 MED ORDER — PROCHLORPERAZINE MALEATE 10 MG PO TABS: 10.0000 mg | ORAL_TABLET | Freq: Four times a day (QID) | ORAL | Status: DC | PRN

## 2011-06-16 MED ORDER — LEVOTHYROXINE SODIUM 100 MCG PO TABS: 100.0000 ug | ORAL_TABLET | Freq: Every day | ORAL | Status: DC

## 2011-06-16 MED ORDER — FUROSEMIDE 40 MG PO TABS: 40.0000 mg | ORAL_TABLET | Freq: Two times a day (BID) | ORAL | Status: DC

## 2011-06-16 MED ORDER — VALSARTAN-HYDROCHLOROTHIAZIDE 320-25 MG PO TABS: 1.0000 | ORAL_TABLET | Freq: Every day | ORAL | Status: DC

## 2011-07-07 ENCOUNTER — Ambulatory Visit (INDEPENDENT_AMBULATORY_CARE_PROVIDER_SITE_OTHER): Payer: Medicare Other | Admitting: Family Medicine

## 2011-07-07 ENCOUNTER — Encounter: Payer: Self-pay | Admitting: Family Medicine

## 2011-07-07 VITALS — BP 138/80 | HR 80 | Temp 98.2°F | Wt 200.0 lb

## 2011-07-07 DIAGNOSIS — L03119 Cellulitis of unspecified part of limb: Secondary | ICD-10-CM

## 2011-07-07 DIAGNOSIS — R609 Edema, unspecified: Secondary | ICD-10-CM

## 2011-07-07 DIAGNOSIS — I10 Essential (primary) hypertension: Secondary | ICD-10-CM

## 2011-07-07 DIAGNOSIS — Z853 Personal history of malignant neoplasm of breast: Secondary | ICD-10-CM

## 2011-07-07 DIAGNOSIS — R600 Localized edema: Secondary | ICD-10-CM

## 2011-07-07 DIAGNOSIS — L02419 Cutaneous abscess of limb, unspecified: Secondary | ICD-10-CM

## 2011-07-07 MED ORDER — DOXYCYCLINE HYCLATE 100 MG PO TABS: 100.0000 mg | ORAL_TABLET | Freq: Two times a day (BID) | ORAL | Status: DC

## 2011-07-07 NOTE — Patient Instructions (Addendum)
Right leg is much more swollen than the left and I think you have some cellulitis or infection in the right lower calf been so painful 2 Lasix in the morning and one after lunch for the next 5 days after that take one in the morning and one in the afternoon Be sure to take your potassium when you take to furosemide I am going to send them a prescription for doxycycline 100 mg twice daily for 2 weeks Call me in one week if you are not much better Continue other medications Elevate leg when possible

## 2011-07-09 ENCOUNTER — Encounter: Payer: Self-pay | Admitting: Family Medicine

## 2011-07-09 NOTE — Progress Notes (Signed)
  Subjective:    Patient ID: Alexa [name], female    DOB: 1936/10/19, 75 y.o.   MRN: [id number] This 75 year old white female who has had bilateral breast cancer and mastectomy is in today complaining of marked swelling of the right lower leg as well as in very painful or tender to touch. Is been happening over the past 2-3 days increased in severity blood pressure well controlled 138/80 no other complaints chronic pain is controlled with morphine and oxycodone HPI    Review of Systems CHPI    Objective:   Physical Exam patient is a well-built alert white female who appears to be in no distress but complains of pain. Heart and lungs no abnormalities at this time Examination of right lower leg reveals edema 3+ with a catheter in very erythematous tender to touch however over the vessel where is no tenderness no evidence of phlebitis examination of the thigh is negative Left lower leg 1+ edema       Assessment & Plan:  Cellulitis and edema right lower leg, to treat with doxycycline 100 mg twice a day for 2 weeks also increase furosemide 80 mg in the a.m. 40 mg in the afternoon Hypertension controlled no change in Chronic pain continue morphine and oxycodone

## 2011-07-12 NOTE — Patient Instructions (Signed)
I am going to refer you to a cardiologist for evaluation

## 2011-07-12 NOTE — Progress Notes (Signed)
  Subjective:    Patient ID: Alexa [name], female    DOB: [birth year], 75 y.o.   MRN: [id number] This 75 year old white widow who has past history of bilateral mastectomy, hypertension chronic back pain with numerous spine surgeries relates that over the past 2-3 week she has had increased shortness of breath on exertionand when attempting to climb hills or on inclines. She has no chest pain no diaphoresis occasional wheezes on exertion no problem wi th cough, has fatigue she has hypothyroidism and and is treated with levothyroxin 100 mcg as stated above she has chronic back pain for which he goes to the pain clinic and treated with oxycodone and morphine Has hypertension but is controlled.HPI    Review of Systems see history of present illness    Objective:   Physical Exam patient is a well-built well-nourished pleasant cooperative white female who is in no distress HEENT no positive findings heart examination no cardiomegaly no murmurs rhythm regular EKG reveals no ischemia no acute changes Lung examination clear to palpation percussion and auscultation no rales or wheezing Extremities trace edema bilaterally         Assessment & Plan:

## 2011-07-19 ENCOUNTER — Encounter: Payer: Self-pay | Admitting: Cardiology

## 2011-07-22 ENCOUNTER — Encounter: Payer: Self-pay | Admitting: Family Medicine

## 2011-07-22 ENCOUNTER — Encounter (INDEPENDENT_AMBULATORY_CARE_PROVIDER_SITE_OTHER): Payer: Medicare Other | Admitting: Cardiology

## 2011-07-22 ENCOUNTER — Ambulatory Visit: Payer: Medicare Other | Admitting: Family Medicine

## 2011-07-22 VITALS — BP 140/70 | HR 92 | Temp 98.1°F | Wt 198.0 lb

## 2011-07-22 DIAGNOSIS — M7989 Other specified soft tissue disorders: Secondary | ICD-10-CM

## 2011-07-22 DIAGNOSIS — L539 Erythematous condition, unspecified: Secondary | ICD-10-CM

## 2011-07-22 DIAGNOSIS — L0390 Cellulitis, unspecified: Secondary | ICD-10-CM

## 2011-07-22 DIAGNOSIS — O2230 Deep phlebothrombosis in pregnancy, unspecified trimester: Secondary | ICD-10-CM

## 2011-07-22 MED ORDER — CEPHALEXIN 500 MG PO CAPS: 500.0000 mg | ORAL_CAPSULE | Freq: Four times a day (QID) | ORAL | Status: AC

## 2011-07-22 MED ORDER — DOXYCYCLINE HYCLATE 100 MG PO TABS: 100.0000 mg | ORAL_TABLET | Freq: Two times a day (BID) | ORAL | Status: AC

## 2011-07-22 MED ORDER — CEFTRIAXONE SODIUM 1 G IJ SOLR
1.0000 g | INTRAMUSCULAR | Status: DC
  Administered 2011-07-22: 1 g via INTRAMUSCULAR

## 2011-07-22 NOTE — Patient Instructions (Addendum)
Cellulitis and edema the right lower leg has not improved We'll get a Doppler study of the circulation of your right leg day Continue doxycycline so that prescription refill Add another antibiotic called cephalexin 500 mg 4 times a day for 10 days Injection of Rocephin 1 g today and return tomorrow for an injection Please make an appointment for  Tuesday Elevate leg as much as possible, also apply heat 30 minutes at least 3-4 times per day

## 2011-07-23 ENCOUNTER — Ambulatory Visit (INDEPENDENT_AMBULATORY_CARE_PROVIDER_SITE_OTHER): Payer: Medicare Other | Admitting: Internal Medicine

## 2011-07-23 DIAGNOSIS — R600 Localized edema: Secondary | ICD-10-CM

## 2011-07-23 DIAGNOSIS — R609 Edema, unspecified: Secondary | ICD-10-CM

## 2011-07-23 MED ORDER — CEFTRIAXONE SODIUM 1 G IJ SOLR
1.0000 g | INTRAMUSCULAR | Status: DC
  Administered 2011-07-23: 1 g via INTRAMUSCULAR

## 2011-07-26 ENCOUNTER — Ambulatory Visit: Payer: Medicare Other | Admitting: Internal Medicine

## 2011-08-09 ENCOUNTER — Encounter: Payer: Self-pay | Admitting: Cardiology

## 2011-08-09 ENCOUNTER — Ambulatory Visit (INDEPENDENT_AMBULATORY_CARE_PROVIDER_SITE_OTHER): Payer: Medicare Other | Admitting: Cardiology

## 2011-08-09 DIAGNOSIS — I10 Essential (primary) hypertension: Secondary | ICD-10-CM

## 2011-08-09 DIAGNOSIS — R0600 Dyspnea, unspecified: Secondary | ICD-10-CM

## 2011-08-09 DIAGNOSIS — R609 Edema, unspecified: Secondary | ICD-10-CM

## 2011-08-09 DIAGNOSIS — R0609 Other forms of dyspnea: Secondary | ICD-10-CM

## 2011-08-09 NOTE — Assessment & Plan Note (Signed)
This seems to be a chronic issue for the patient that has been recently getting worse. ECG shows normal sinus rhythm.  Will investigate possible congestive heart failure with BNP. She does have significant bilateral edema but this seems to be a chronic issue due to her venous insufficiency. Will also check 2D-ECHO and order exercise nuclear stress test to further evaluate heart function.  Will also check BMET due to being on an increased Lasix dose recently. Also advised patient to start taking baby aspirin, enteric-coated, in the meantime.   Another relevant etiology for patient may be pulmonary, however, work-up for the right-lung nodule seems to be negative for malignancy for now.

## 2011-08-09 NOTE — Assessment & Plan Note (Signed)
Appears to be longstanding problem for the patient and about at baseline. Will continue current medications for now and not make any changes to diuretics at this point. Will check BMET to evaluate kidney function on Lasix, which had been increased briefly by her PCP recently.

## 2011-08-09 NOTE — Patient Instructions (Signed)
Your physician has requested that you have an echocardiogram. Echocardiography is a painless test that uses sound waves to create images of your heart. It provides your doctor with information about the size and shape of your heart and how well your heart's chambers and valves are working. This procedure takes approximately one hour. There are no restrictions for this procedure.    Your physician has requested that you have en exercise stress myoview. For further information please visit [email or web address] Please follow instruction sheet, as given.  Your physician recommends that you return for a FASTING lipid profile/liver profile/BMP 786.09  401.9  Your physician recommends that you schedule a follow-up appointment in: 2-3 weeks with Dr Shirlee Latch.

## 2011-08-09 NOTE — Progress Notes (Signed)
  Subjective:    Patient ID: Alexa [name], female    DOB: [birth year], 75 y.o.   MRN: [id number] PCP: Dr. Scotty Court  HPI This is a 75 year old female referred by her PCP due to shortness-of-breath with exertion. Her past medical history is significant for history of adenocarcinoma s/p radiation and mastectomy in 2002, hypertension, hyperlipidemia, lower extremity venous insufficiency, remote history of tobacco abuse, right lung mass currently being monitored and that seems to be benign, and currently being treated for right lower extremity cellulitis. Her dyspnea has been going on for the past several months (patient reports noticing it in the Spring) but has gotten worse over past several weeks. Now, she has difficulty breathing walking from car to clinic, 50-100 yards. She denies any chest pain associated with this although does complain of infrequent episodes of chest discomfort lasting a few seconds. Denies palpitations. She reports having had chemotherapy in the past for her breast cancer, but she does not remember the name. Previous physicians' notes only report her breast cancer being treated with radiation and surgery.    Review of Systems Per HPI with addition of following: denies fevers/chills, nausea/vomiting. Occasional episodes of mild dizziness/lightheadedness.   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 Father died of heart attack at age 63. Sister had breast cancer.     Objective:   Physical Exam Gen: NAD, overweight Psych: slightly anxious-appearing, engaged, appropriate to questions CV: RRR, normal S1/S2, no murmurs/gallops; JVP at 7cm  with some hepatojugular reflux Pulm: no increased WOB, good effort and aeration, CTAB with no wheezes/rales/ronchi Abd: obese, soft, non-tender Ext: warm, 1+ pedal pulses bilaterally, mottling of bilateral lower extremities halfway up calf with diffuse telangiectasias, especially around the ankle and with multiple varicose veins, red-purple birthmark on right leg from ankle to hip, area of abrasion/erythema near lateral malleolus, 2-3+ pitting edema bilaterally, no calf TTP    Assessment & Plan:

## 2011-08-10 NOTE — Progress Notes (Signed)
Patient seen with resident [facility], agree with note.    Significantly increased exertional dyspnea x several months as well as increased lower extremity swelling.  A component of the swelling certainly is due to venous insufficiency.  No chest pain.  She had chemotherapy in the past, not sure if it included adriamycin or Herceptin.  She has atypical chest pain.  On exam, she has significant lower leg edema and bilateral varicosities.  JVP does not seem elevated. No murmur on heart exam.    I am concerned that this presentation could be consistent with new onset CHF.  Ischemic diastolic dysfunction would also be a consideration.  - Echocardiogram - BNP - ETT-myoview to rule out ischemia as cause of exertional symptoms.

## 2011-08-20 LAB — URINE MICROSCOPIC-ADD ON

## 2011-08-20 LAB — COMPREHENSIVE METABOLIC PANEL
Albumin: 3.8
Alkaline Phosphatase: 121 — ABNORMAL HIGH
BUN: 19
Chloride: 103
Creatinine, Ser: 0.84
Glucose, Bld: 99
Potassium: 3.7
Total Bilirubin: 0.8
Total Protein: 7.2

## 2011-08-20 LAB — TYPE AND SCREEN
ABO/RH(D): O POS
Antibody Screen: NEGATIVE

## 2011-08-20 LAB — URINALYSIS, ROUTINE W REFLEX MICROSCOPIC
Bilirubin Urine: NEGATIVE
Hgb urine dipstick: NEGATIVE
Nitrite: NEGATIVE
Protein, ur: NEGATIVE
Specific Gravity, Urine: 1.031 — ABNORMAL HIGH
Urobilinogen, UA: 1

## 2011-08-20 LAB — CBC
HCT: 39.4
Hemoglobin: 13.6
MCV: 84
Platelets: 257
RDW: 14

## 2011-08-20 LAB — APTT: aPTT: 28

## 2011-08-20 LAB — PROTIME-INR
INR: 0.9
Prothrombin Time: 12.8

## 2011-08-25 ENCOUNTER — Ambulatory Visit (HOSPITAL_BASED_OUTPATIENT_CLINIC_OR_DEPARTMENT_OTHER): Payer: Medicare Other | Admitting: Radiology

## 2011-08-25 ENCOUNTER — Other Ambulatory Visit (INDEPENDENT_AMBULATORY_CARE_PROVIDER_SITE_OTHER): Payer: Medicare Other | Admitting: *Deleted

## 2011-08-25 ENCOUNTER — Other Ambulatory Visit: Payer: Self-pay | Admitting: Cardiology

## 2011-08-25 ENCOUNTER — Ambulatory Visit (HOSPITAL_COMMUNITY): Payer: Medicare Other | Attending: Cardiology | Admitting: Radiology

## 2011-08-25 VITALS — Ht 63.0 in | Wt 202.0 lb

## 2011-08-25 DIAGNOSIS — R0989 Other specified symptoms and signs involving the circulatory and respiratory systems: Secondary | ICD-10-CM

## 2011-08-25 DIAGNOSIS — R0609 Other forms of dyspnea: Secondary | ICD-10-CM | POA: Insufficient documentation

## 2011-08-25 DIAGNOSIS — I10 Essential (primary) hypertension: Secondary | ICD-10-CM

## 2011-08-25 DIAGNOSIS — R0789 Other chest pain: Secondary | ICD-10-CM

## 2011-08-25 DIAGNOSIS — I491 Atrial premature depolarization: Secondary | ICD-10-CM

## 2011-08-25 DIAGNOSIS — I4949 Other premature depolarization: Secondary | ICD-10-CM

## 2011-08-25 LAB — BASIC METABOLIC PANEL
BUN: 25 mg/dL — ABNORMAL HIGH (ref 6–23)
CO2: 28 mEq/L (ref 19–32)
Chloride: 104 mEq/L (ref 96–112)
Creatinine, Ser: 0.9 mg/dL (ref 0.4–1.2)
Glucose, Bld: 78 mg/dL (ref 70–99)
Potassium: 4 mEq/L (ref 3.5–5.1)

## 2011-08-25 LAB — LIPID PANEL: Cholesterol: 231 mg/dL — ABNORMAL HIGH (ref 0–200)

## 2011-08-25 LAB — BRAIN NATRIURETIC PEPTIDE: Pro B Natriuretic peptide (BNP): 82 pg/mL (ref 0.0–100.0)

## 2011-08-25 MED ORDER — TECHNETIUM TC 99M TETROFOSMIN IV KIT
33.0000 | PACK | Freq: Once | INTRAVENOUS | Status: AC | PRN
  Administered 2011-08-25: 33 via INTRAVENOUS

## 2011-08-25 MED ORDER — TECHNETIUM TC 99M TETROFOSMIN IV KIT
11.0000 | PACK | Freq: Once | INTRAVENOUS | Status: AC | PRN
  Administered 2011-08-25: 11 via INTRAVENOUS

## 2011-08-25 NOTE — Progress Notes (Signed)
[facility] SITE 3 NUCLEAR MED [address] [place] [address] [phone]  Cardiology Nuclear Med Study  Alexa [name] is a 76 y.o. female [id number] 1936-05-07   Nuclear Med Background Indication for Stress Test:  Evaluation for Ischemia; New Onset CHF, ? Ischemia Diastolic Dysfunction History:  1980's Cath:Normal; '00 UVO:[id number] Cardiac Risk Factors: Family History - CAD, History of Smoking, Hypertension, Lipids and Obesity  Symptoms:  Chest Pressure with Exertion (difficult historian), Dizziness and DOE   Nuclear Pre-Procedure Caffeine/Decaff Intake:  None NPO After: 10:00pm   Lungs:  Clear.  O2 SAT 96% on RA. IV 0.9% NS with Angio Cath:  20g  IV Site: R Antecubital  IV Started by:  Bonnita Levan, RN  Chest Size (in):  Bilateral Mastectomy Cup Size: n/a  Height: 5\' 3"  (1.6 m)  Weight:  202 lb (91.627 kg)  BMI:  Body mass index is 35.78 kg/(m^2). Tech Comments:  BMET,BNP drawn    Nuclear Med Study 1 or 2 day study: 1 day  Stress Test Type:  Stress  Reading MD: Kristeen Miss, MD  Order Authorizing Provider:  Marca Ancona, MD  Resting Radionuclide: Technetium 73m Tetrofosmin  Resting Radionuclide Dose: 11.0 mCi   Stress Radionuclide:  Technetium 77m Tetrofosmin  Stress Radionuclide Dose: 33.0 mCi           Stress Protocol Rest HR: 62 Stress HR: 139  Rest BP: 147/76 Stress BP: 189/92  Exercise Time (min): 4:00 METS: 5.0   Predicted Max HR: 147 bpm % Max HR: 94.56 bpm Rate Pressure Product: 03474   Dose of Adenosine (mg):  n/a Dose of Lexiscan: n/a mg  Dose of Atropine (mg): n/a Dose of Dobutamine: n/a mcg/kg/min (at max HR)  Stress Test Technologist: Smiley Houseman, CMA-N  Nuclear Technologist:  Domenic Polite, CNMT     Rest Procedure:  Myocardial perfusion imaging was performed at rest 45 minutes following the intravenous administration of Technetium 68m Tetrofosmin.  Rest ECG: No acute changes.  Stress Procedure:  The patient  exercised for four minutes on the treadmill utilizing the Bruce protocol.  The patient stopped due to fatigue.  She did c/o chest pressure, 5/10, with exercise.  There were no diagnostic ST-T wave changes, only occasional PVC's and rare PAC's.  Peak O2 SAT was 96% during exercise.  Technetium 96m Tetrofosmin was injected at peak exercise and myocardial perfusion imaging was performed after a brief delay.  Stress ECG: No significant change from baseline ECG  QPS Raw Data Images:  Normal; no motion artifact; normal heart/lung ratio. Stress Images:  Normal homogeneous uptake in all areas of the myocardium. Rest Images:  Normal homogeneous uptake in all areas of the myocardium. Subtraction (SDS):  No evidence of ischemia. Transient Ischemic Dilatation (Normal <1.22):  0.96 Lung/Heart Ratio (Normal <0.45):  0.30  Quantitative Gated Spect Images QGS EDV:  91 ml QGS ESV:  29 ml QGS cine images:  NL LV Function; NL Wall Motion QGS EF: 68%  Impression Exercise Capacity:  Fair exercise capacity. BP Response:  Normal blood pressure response. Clinical Symptoms:  Mild chest pain/dyspnea. ECG Impression:  No significant ST segment change suggestive of ischemia. Comparison with Prior Nuclear Study: No previous nuclear study performed  Overall Impression:  Normal stress nuclear study.  No evidence of ischemia.  Normal LV function with an EF of 68%.    Vesta Mixer, Montez Hageman., MD, [facility]

## 2011-08-26 NOTE — Progress Notes (Signed)
Normal stress test.  Please inform patient.  Dawayne Ohair [employer]

## 2011-08-30 ENCOUNTER — Encounter: Payer: Self-pay | Admitting: Cardiology

## 2011-08-30 ENCOUNTER — Ambulatory Visit (INDEPENDENT_AMBULATORY_CARE_PROVIDER_SITE_OTHER): Payer: Medicare Other | Admitting: Cardiology

## 2011-08-30 VITALS — BP 132/64 | HR 84 | Ht 65.0 in | Wt 200.0 lb

## 2011-08-30 DIAGNOSIS — I509 Heart failure, unspecified: Secondary | ICD-10-CM

## 2011-08-30 DIAGNOSIS — I5032 Chronic diastolic (congestive) heart failure: Secondary | ICD-10-CM

## 2011-08-30 DIAGNOSIS — R0989 Other specified symptoms and signs involving the circulatory and respiratory systems: Secondary | ICD-10-CM

## 2011-08-30 DIAGNOSIS — E785 Hyperlipidemia, unspecified: Secondary | ICD-10-CM

## 2011-08-30 DIAGNOSIS — R0600 Dyspnea, unspecified: Secondary | ICD-10-CM

## 2011-08-30 LAB — ABO/RH: ABO/RH(D): O POS

## 2011-08-30 LAB — COMPREHENSIVE METABOLIC PANEL
Albumin: 2.6 — ABNORMAL LOW
BUN: 21
Calcium: 8.7
Creatinine, Ser: 1.01
Total Bilirubin: 0.6
Total Protein: 5.8 — ABNORMAL LOW

## 2011-08-30 LAB — CBC
HCT: 31.9 — ABNORMAL LOW
HCT: 32 — ABNORMAL LOW
HCT: 38.3
HCT: 39.3
Hemoglobin: 12.7
Hemoglobin: 13.1
MCHC: 34.8
MCV: 86.3
MCV: 87.6
MCV: 87.7
MCV: 88
Platelets: 332
Platelets: 358
RBC: 3.65 — ABNORMAL LOW
RDW: 13.4
RDW: 13.6
WBC: 5.8
WBC: 9.5

## 2011-08-30 LAB — BASIC METABOLIC PANEL
BUN: 15
BUN: 19
BUN: 32 — ABNORMAL HIGH
Chloride: 100
Chloride: 95 — ABNORMAL LOW
Chloride: 95 — ABNORMAL LOW
Chloride: 96
GFR calc non Af Amer: >60
GFR calc non Af Amer: >60
Glucose, Bld: 109 — ABNORMAL HIGH
Glucose, Bld: 134 — ABNORMAL HIGH
Glucose, Bld: 81
Potassium: 3.5
Potassium: 3.6
Potassium: 3.7
Potassium: 3.9
Sodium: 130 — ABNORMAL LOW
Sodium: 140

## 2011-08-30 LAB — DIFFERENTIAL
Basophils Absolute: 0
Lymphocytes Relative: 17
Monocytes Absolute: 0.5
Monocytes Relative: 9
Neutro Abs: 3.9

## 2011-08-30 LAB — TYPE AND SCREEN

## 2011-08-30 MED ORDER — ROSUVASTATIN CALCIUM 5 MG PO TABS: ORAL_TABLET | ORAL | Status: DC

## 2011-08-30 NOTE — Patient Instructions (Signed)
Take Crestor 5mg  every other day.  Take coenzyme Q10 200mg  daily--you do not need a prescription for this.  Use compression stockings to help the swelling in your feet and legs.  Your physician recommends that you return for a FASTING lipid profile/liver profile in 2 months--428.32  Your physician wants you to follow-up in: 6 months with Dr Shirlee Latch. (April 2013) You will receive a reminder letter in the mail two months in advance. If you don't receive a letter, please call our office to schedule the follow-up appointment.

## 2011-08-30 NOTE — Progress Notes (Signed)
Reviewed at appt with Dr Shirlee Latch 08/30/11.

## 2011-08-31 NOTE — Progress Notes (Signed)
Subjective:    Patient ID: Alexa [name], female    DOB: 16-Nov-1936, 75 y.o.   MRN: [id number] PCP: Dr. Scotty Court  This is a 75 year old female referred by her PCP due to shortness-of-breath with exertion. Her past medical history is significant for history of adenocarcinoma s/p radiation and mastectomy in 2002, hypertension, hyperlipidemia, lower extremity venous insufficiency, remote history of tobacco abuse, and right lung mass currently being monitored and that seems to be benign.  She has noted increased dyspnea for a number of months now.  She is active in general and does yardwork and housework.  She notes dyspnea after walking about 50 yards though she does not have to stop.  No chest pain.  Weight is down 2 lbs since last appointment, and she does feel less short of breath on Lasix.  Echo showed normal EF and some evidence for diastolic CHF.  Myoview showed below average exercise tolerance but no evidence for ischemia or infarction.    Labs (9/12): K 4, creatinine 0.9, BNP 82, LDL 163, HDL 53, TGs 80  PMH: 1. hypertension 2. Hyperlipidemia: Myalgias with multiple statins.  3. hypothyroidism 4. GERD 5. history of tobacco abuse; quit 25+ years ago 5. Breast adenocarcinoma s/p right simple and left modified radical mastectomy with chemotherapy and radiation in 2002.  6. cervical and lumbar herniation and DJD s/p multiple diskectomies and other back surgeries from 2002-2008 - chronic back pain being followed at the pain clinic 7. Diastolic CHF: Echo (9/12) with EF 60-65%, mild MR, moderate TR, PA systolic pressure 48 mmHg.  ETT-myoview (9/12) with 4' exercise, EF 68%, no ischemia or infarction.   FHx: Mother died of stroke at age 50. Father died of heart attack at age 39. Sister had breast cancer.  SH:  Prior smoker. Lives in [place].   ROS: All systems reviewed and negative except as per HPI.   Current Outpatient Prescriptions  Medication Sig Dispense Refill  . esomeprazole  (NEXIUM) 40 MG capsule Take 40 mg by mouth daily before breakfast.        . furosemide (LASIX) 40 MG tablet Take 1 tablet (40 mg total) by mouth 2 (two) times daily.  60 tablet  11  . levothyroxine (SYNTHROID) 100 MCG tablet Take 1 tablet (100 mcg total) by mouth daily.  30 tablet  11  . morphine (MSIR) 30 MG tablet Take 30 mg by mouth 3 (three) times daily.       Marland Kitchen oxyCODONE-acetaminophen (PERCOCET) 10-325 MG per tablet Take 1 tablet by mouth every 4 (four) hours as needed.        . potassium chloride SA (K-DUR,KLOR-CON) 20 MEQ tablet Take 20 mEq by mouth daily.        . prochlorperazine (COMPAZINE) 10 MG tablet Take 1 tablet (10 mg total) by mouth every 6 (six) hours as needed. For nausea  30 tablet  11  . valsartan-hydrochlorothiazide (DIOVAN-HCT) 320-25 MG per tablet Take 1 tablet by mouth daily.  30 tablet  11  . Coenzyme Q10 200 MG capsule Take 1 capsule (200 mg total) by mouth daily.      . rosuvastatin (CRESTOR) 5 MG tablet Take 1 tablet every other day  15 tablet  3   Current Facility-Administered Medications  Medication Dose Route Frequency Provider Last Rate Last Dose  . cefTRIAXone (ROCEPHIN) injection 1 g  1 g Intramuscular Q24H [employer].   1 g at 07/22/11 1407  . cefTRIAXone (ROCEPHIN) injection 1 g  1 g Intramuscular  Q24H [employer].   1 g at 07/23/11 1100    BP 132/64  Pulse 84  Ht 5\' 5"  (1.651 m)  Wt 200 lb (90.719 kg)  BMI 33.28 kg/m2 General: NAD, overweight Neck: No JVD, no thyromegaly or thyroid nodule.  Lungs: Clear to auscultation bilaterally with normal respiratory effort. CV: Nondisplaced PMI.  Heart regular S1/S2, no S3/S4, no murmur.  1+ edema 1/2 up lower legs bilaterally.  Bilateral lower leg venous varicosities.  No carotid bruit.  Normal pedal pulses.  Abdomen: Soft, nontender, no hepatosplenomegaly, no distention.  Neurologic: Alert and oriented x 3.  Psych: Normal affect. Extremities: No clubbing or cyanosis.

## 2011-08-31 NOTE — Assessment & Plan Note (Signed)
No evidence of ischemia or infarction by [facility] but below normal exercise tolerance.  Echo showed normal EF but mild elevation in pulmonary artery pressure suggests the possbility of LV diastolic dysfunction.  I suspect that the patient's exertional dyspnea is contributed to by chronic diastolic CHF and also obesity/deconditioning.  I also suspect that venous insufficiency plays a role in her lower extremity edema.  She is losing weight and feeling better on current dose of Lasix, so I will continue this.  I would like her to wear compression stockings on her lower legs.  Finally, I would like her to walk for exercise for up to 30 minutes 5-6 times a week.  She needs to adjust her diet as well to lose weight.

## 2011-08-31 NOTE — Assessment & Plan Note (Signed)
LDL is very high.  Patient has had a hard time tolerating statins in the past.  I would like her to try Crestor 5 mg every other day with coenzyme Q10 200 mg daily.  If she is able to tolerate this regimen, I will check lipids/LFTs in 2 months.   I will see her back in 6 months.

## 2011-09-03 LAB — COMPREHENSIVE METABOLIC PANEL
ALT: 20
AST: 33
Albumin: 3.8
CO2: 32
Calcium: 9.3
Chloride: 105
Creatinine, Ser: 0.92
GFR calc Af Amer: >60
GFR calc non Af Amer: >60
Sodium: 146 — ABNORMAL HIGH
Total Bilirubin: 0.9

## 2011-09-03 LAB — ABO/RH: ABO/RH(D): O POS

## 2011-09-03 LAB — TYPE AND SCREEN: ABO/RH(D): O POS

## 2011-09-03 LAB — URINALYSIS, ROUTINE W REFLEX MICROSCOPIC
Bilirubin Urine: NEGATIVE
Ketones, ur: NEGATIVE
Nitrite: NEGATIVE
Urobilinogen, UA: 0.2
pH: 5.5

## 2011-09-03 LAB — CBC
Platelets: 262
RBC: 4.5
WBC: 4.9

## 2011-09-03 LAB — APTT: aPTT: 29

## 2011-09-06 ENCOUNTER — Other Ambulatory Visit: Payer: Self-pay | Admitting: Oncology

## 2011-09-06 ENCOUNTER — Encounter (HOSPITAL_BASED_OUTPATIENT_CLINIC_OR_DEPARTMENT_OTHER): Payer: Medicare Other | Admitting: Oncology

## 2011-09-06 DIAGNOSIS — C50519 Malignant neoplasm of lower-outer quadrant of unspecified female breast: Secondary | ICD-10-CM

## 2011-09-06 LAB — CBC WITH DIFFERENTIAL/PLATELET
BASO%: 0.7 % (ref 0.0–2.0)
Basophils Absolute: 0 10*3/uL (ref 0.0–0.1)
EOS%: 3.2 % (ref 0.0–7.0)
HGB: 12.6 g/dL (ref 11.6–15.9)
MCH: 28.3 pg (ref 25.1–34.0)
MCHC: 33.2 g/dL (ref 31.5–36.0)
MCV: 85.4 fL (ref 79.5–101.0)
MONO%: 8.1 % (ref 0.0–14.0)
RBC: 4.45 10*6/uL (ref 3.70–5.45)
RDW: 14.5 % (ref 11.2–14.5)
lymph#: 1.8 10*3/uL (ref 0.9–3.3)

## 2011-09-07 LAB — COMPREHENSIVE METABOLIC PANEL
ALT: 13 U/L (ref 0–35)
AST: 20 U/L (ref 0–37)
Albumin: 3.7 g/dL (ref 3.5–5.2)
Alkaline Phosphatase: 97 U/L (ref 39–117)
BUN: 29 mg/dL — ABNORMAL HIGH (ref 6–23)
Calcium: 9.6 mg/dL (ref 8.4–10.5)
Chloride: 100 mEq/L (ref 96–112)
Potassium: 3.3 mEq/L — ABNORMAL LOW (ref 3.5–5.3)
Sodium: 141 mEq/L (ref 135–145)

## 2011-09-14 ENCOUNTER — Encounter (HOSPITAL_BASED_OUTPATIENT_CLINIC_OR_DEPARTMENT_OTHER): Payer: Medicare Other | Admitting: Oncology

## 2011-09-14 DIAGNOSIS — C50519 Malignant neoplasm of lower-outer quadrant of unspecified female breast: Secondary | ICD-10-CM

## 2011-09-14 DIAGNOSIS — E039 Hypothyroidism, unspecified: Secondary | ICD-10-CM

## 2011-09-14 DIAGNOSIS — R918 Other nonspecific abnormal finding of lung field: Secondary | ICD-10-CM

## 2011-09-14 DIAGNOSIS — M1990 Unspecified osteoarthritis, unspecified site: Secondary | ICD-10-CM

## 2011-09-15 ENCOUNTER — Encounter: Payer: Self-pay | Admitting: Cardiothoracic Surgery

## 2011-09-15 ENCOUNTER — Other Ambulatory Visit: Payer: Self-pay

## 2011-09-15 DIAGNOSIS — C3491 Malignant neoplasm of unspecified part of right bronchus or lung: Secondary | ICD-10-CM | POA: Insufficient documentation

## 2011-09-16 ENCOUNTER — Ambulatory Visit (INDEPENDENT_AMBULATORY_CARE_PROVIDER_SITE_OTHER): Payer: Medicare Other | Admitting: Cardiothoracic Surgery

## 2011-09-16 ENCOUNTER — Encounter: Payer: Self-pay | Admitting: Cardiothoracic Surgery

## 2011-09-16 VITALS — BP 170/76 | HR 68 | Resp 20 | Ht 63.0 in | Wt 200.0 lb

## 2011-09-16 DIAGNOSIS — R222 Localized swelling, mass and lump, trunk: Secondary | ICD-10-CM

## 2011-09-16 NOTE — Patient Instructions (Signed)
Return 6 months and will get follow up scan of chest

## 2011-09-16 NOTE — Progress Notes (Signed)
[address]            Alexa [name] [address]          [phone]       Alexa [name] Date of Birth: March 02, 1936  Alexa Loron, MD STAFFORD Glean Hess, MD  Chief Complaint:  Chief Complaint  Patient presents with  . Follow-up    3 MONTH F/U, EVAL RIGHT LUNG MASS     History of Present Illness:  Patient returns today for followup visit concerning possible right lung lesion and consists following her since March of 2011 when a CT scan done March 1 raised the issue of a right lung lesion in looking back at old films this infiltrative process has been present since 2009. EMB biopsy was negative on 09/07/2010 but because of the question of possible slow-growing bronchial alveolar carcinoma presenting in this fashion it continued to follow the patient carefully. The last time I saw her she was having increasing shortness of breath and was referred for 2 cardiology. A Cardiolite stress test was performed and reported as negative. The patient notes that she has been placed on increased dose of Lasix and this is improved both her lower extremity edema and shortness of breath. She's had no hemoptysis or pulmonary infections recently. She knows she has not had a flu shot this year but plans on getting it next week.  Past Medical History  Diagnosis Date  . Hyperlipidemia   . Hypertension   . Cancer     breast  . GERD (gastroesophageal reflux disease)   . Diverticulosis   . Arthritis   . Hx of adenomatous colonic polyps     Past Surgical History  Procedure Date  . Cholecystectomy 2007  . Breast surgery 2002    BL mastectomy  . Lumbar laminectomy   . Rotator cuff repair   . Cervical spine surgery   . Hip surgery   . Spine surgery   . Abdominal hysterectomy   . Fiberoptic bronchoscopy with electromagnetic 10/07/2010    History  Smoking status  . Former Smoker  Smokeless tobacco  . Never Used    History  Alcohol Use No     History   Social History  . Marital Status: Widowed    Spouse Name: N/A    Number of Children: N/A  . Years of Education: N/A   Occupational History  . Not on file.   Social History Main Topics  . Smoking status: Former Games developer  . Smokeless tobacco: Never Used  . Alcohol Use: No  . Drug Use: No  . Sexually Active: No   Other Topics Concern  . Not on file   Social History Narrative  . No narrative on file    Allergies  Allergen Reactions  . Hydrocodone-Homatropine     REACTION: Nausea  . Nsaids   . Penicillins     REACTION: rash  . Simvastatin     REACTION: muscle pain  . Strawberry     Current Outpatient Prescriptions  Medication Sig Dispense Refill  . furosemide (LASIX) 40 MG tablet Take 1 tablet (40 mg total) by mouth 2 (two) times daily.  60 tablet  11  . levothyroxine (SYNTHROID) 100 MCG tablet Take 1 tablet (100 mcg total) by mouth daily.  30 tablet  11  . morphine (MSIR) 30 MG tablet Take 30 mg by mouth 3 (three)  times daily.       Marland Kitchen oxyCODONE-acetaminophen (PERCOCET) 10-325 MG per tablet Take 1 tablet by mouth every 4 (four) hours as needed.        . potassium chloride SA (K-DUR,KLOR-CON) 20 MEQ tablet Take 20 mEq by mouth daily.        . prochlorperazine (COMPAZINE) 10 MG tablet Take 1 tablet (10 mg total) by mouth every 6 (six) hours as needed. For nausea  30 tablet  11  . rosuvastatin (CRESTOR) 5 MG tablet Take 1 tablet every other day  15 tablet  3  . valsartan-hydrochlorothiazide (DIOVAN-HCT) 320-25 MG per tablet Take 1 tablet by mouth daily.  30 tablet  11  . Coenzyme Q10 200 MG capsule Take 1 capsule (200 mg total) by mouth daily.       Current Facility-Administered Medications  Medication Dose Route Frequency Provider Last Rate Last Dose  . cefTRIAXone (ROCEPHIN) injection 1 g  1 g Intramuscular Q24H [employer].   1 g at 07/22/11 1407  . cefTRIAXone (ROCEPHIN) injection 1 g  1 g Intramuscular Q24H [facility].   1 g  at 07/23/11 1100     Family History  Problem Relation Age of Onset  . Diabetes Daughter   . Diabetes Daughter   . Heart disease Father   . Heart disease Brother   . Heart disease Sister   . Colon cancer Neg Hx   . Colon polyps Daughter        Physical Exam: BP 170/76  Pulse 68  Resp 20  Ht 5\' 3"  (1.6 m)  Wt 200 lb (90.719 kg)  BMI 35.43 kg/m2  SpO2 98%  Overall the patient appears in no distress she is not short of breath at rest she has no jugular venous distention I do not appreciate any cervical or supraclavicular adenopathy or axillary adenopathy Her lungs are clear bilaterally Cardiac exam feels regular rate and rhythm without murmur gallop, she's had previous bilateral mastectomy Lower extremity is have 2+ pedal edema bilaterally the patient notes this is improving.  Diagnostic Studies & Laboratory data:    I have reviewed the patient's previous scans from 2009 ~July 2012 with her .we discussed both because of her previous diagnosis of breast cancer and a question of a slow-growing bronchoalveolar carcinoma but we will repeat the CT scan of the chest in 6 months       Assessment / Plan:  followup today shows the patient is stable and improving from her increased diuretic   we discussed the followup of her right lower lobe lung lesion and we'll plan on a CT scan in 6 months  Delight Ovens MD

## 2011-09-21 ENCOUNTER — Ambulatory Visit (INDEPENDENT_AMBULATORY_CARE_PROVIDER_SITE_OTHER): Payer: Medicare Other | Admitting: Family Medicine

## 2011-09-21 ENCOUNTER — Encounter: Payer: Self-pay | Admitting: Family Medicine

## 2011-09-21 VITALS — BP 164/88 | HR 89 | Temp 98.7°F | Wt 201.0 lb

## 2011-09-21 DIAGNOSIS — T148XXD Other injury of unspecified body region, subsequent encounter: Secondary | ICD-10-CM

## 2011-09-21 DIAGNOSIS — T07XXXA Unspecified multiple injuries, initial encounter: Secondary | ICD-10-CM

## 2011-09-21 DIAGNOSIS — Z23 Encounter for immunization: Secondary | ICD-10-CM

## 2011-09-21 NOTE — Progress Notes (Signed)
Addended by: Aniceto Boss A on: 09/21/2011 04:15 PM   Modules accepted: Orders

## 2011-09-21 NOTE — Progress Notes (Signed)
  Subjective:    Patient ID: Alexa [name], female    DOB: 04/25/1936, 75 y.o.   MRN: [id number]  HPI Here for a wound on the right lower leg that has been present for about 3 months but will no heal. No trauma hx. She has a hx of chronic leg edema and recurrent cellulitis. She has ben here several times in the past month and has gotten several shots of Rocephin, as well as course of Doxycycline and then Keflex. The area has not improved and it remains quite painful for her. The wound does not drain at all. She had labs several weeks ago and had a normal CBC and glucose.    Review of Systems  Constitutional: Negative.   Cardiovascular: Positive for leg swelling.  Skin: Positive for wound.       Objective:   Physical Exam  Constitutional: She appears well-developed and well-nourished.  Skin:       The right lower leg is mildly swollen, and is tender. There is a scabbed wound on the lateral lower leg above the ankle. There is a thick eschar here, the wound is dry. No erythema          Assessment & Plan:  Non-healing wound on the right lower leg. I do not feel she needs any more antibiotics at this time, but the wound needs to be debrided. We will refer her to the Wound Clinic.

## 2011-09-24 ENCOUNTER — Other Ambulatory Visit: Payer: Self-pay | Admitting: Neurology

## 2011-09-24 DIAGNOSIS — G3184 Mild cognitive impairment, so stated: Secondary | ICD-10-CM

## 2011-09-28 ENCOUNTER — Encounter (HOSPITAL_BASED_OUTPATIENT_CLINIC_OR_DEPARTMENT_OTHER): Payer: Medicare Other | Attending: General Surgery

## 2011-09-28 DIAGNOSIS — Z79899 Other long term (current) drug therapy: Secondary | ICD-10-CM | POA: Insufficient documentation

## 2011-09-28 DIAGNOSIS — M069 Rheumatoid arthritis, unspecified: Secondary | ICD-10-CM | POA: Insufficient documentation

## 2011-09-28 DIAGNOSIS — Z853 Personal history of malignant neoplasm of breast: Secondary | ICD-10-CM | POA: Insufficient documentation

## 2011-09-28 DIAGNOSIS — I83009 Varicose veins of unspecified lower extremity with ulcer of unspecified site: Secondary | ICD-10-CM | POA: Insufficient documentation

## 2011-09-28 DIAGNOSIS — I10 Essential (primary) hypertension: Secondary | ICD-10-CM | POA: Insufficient documentation

## 2011-09-28 DIAGNOSIS — R609 Edema, unspecified: Secondary | ICD-10-CM | POA: Insufficient documentation

## 2011-09-30 ENCOUNTER — Ambulatory Visit (INDEPENDENT_AMBULATORY_CARE_PROVIDER_SITE_OTHER): Payer: Medicare Other | Admitting: Vascular Surgery

## 2011-09-30 ENCOUNTER — Emergency Department (HOSPITAL_COMMUNITY)
Admission: EM | Admit: 2011-09-30 | Discharge: 2011-09-30 | Disposition: A | Discharge status: Home / Self Care | Payer: Medicare Other | Attending: Emergency Medicine | Admitting: Emergency Medicine

## 2011-09-30 DIAGNOSIS — L97909 Non-pressure chronic ulcer of unspecified part of unspecified lower leg with unspecified severity: Secondary | ICD-10-CM | POA: Insufficient documentation

## 2011-09-30 DIAGNOSIS — I10 Essential (primary) hypertension: Secondary | ICD-10-CM | POA: Insufficient documentation

## 2011-09-30 DIAGNOSIS — E039 Hypothyroidism, unspecified: Secondary | ICD-10-CM | POA: Insufficient documentation

## 2011-09-30 DIAGNOSIS — I82409 Acute embolism and thrombosis of unspecified deep veins of unspecified lower extremity: Secondary | ICD-10-CM | POA: Insufficient documentation

## 2011-09-30 DIAGNOSIS — M79609 Pain in unspecified limb: Secondary | ICD-10-CM

## 2011-09-30 LAB — PROTIME-INR: Prothrombin Time: 14.2 seconds (ref 11.6–15.2)

## 2011-09-30 LAB — DIFFERENTIAL
Basophils Relative: 1 % (ref 0–1)
Eosinophils Relative: 3 % (ref 0–5)
Monocytes Absolute: 0.6 10*3/uL (ref 0.1–1.0)
Monocytes Relative: 9 % (ref 3–12)
Neutro Abs: 3.8 10*3/uL (ref 1.7–7.7)

## 2011-09-30 LAB — CBC
HCT: 34.7 % — ABNORMAL LOW (ref 36.0–46.0)
Hemoglobin: 11.7 g/dL — ABNORMAL LOW (ref 12.0–15.0)
MCH: 28.7 pg (ref 26.0–34.0)
MCHC: 33.7 g/dL (ref 30.0–36.0)
RDW: 13.8 % (ref 11.5–15.5)

## 2011-09-30 LAB — BASIC METABOLIC PANEL
BUN: 24 mg/dL — ABNORMAL HIGH (ref 6–23)
Chloride: 102 mEq/L (ref 96–112)
Creatinine, Ser: 0.91 mg/dL (ref 0.50–1.10)
Glucose, Bld: 96 mg/dL (ref 70–99)
Potassium: 3.8 mEq/L (ref 3.5–5.1)

## 2011-10-01 ENCOUNTER — Telehealth: Payer: Self-pay | Admitting: Family Medicine

## 2011-10-01 NOTE — Telephone Encounter (Signed)
Tell her she absolutely has to start taking the Coumadin immediately. It is generic and inexpensive.

## 2011-10-01 NOTE — Telephone Encounter (Signed)
Kim from [facility] called [phone] )  Pt was seen at [facility] ER on 11/29/10 and now back at home. Pt was prescribed Lovenox 90 mg bid for 7 days and also Coumadin. Pt cannot afford the injection and she has not picked up the other script yet either. The nurse will check the PT/INR on 10/02/11.

## 2011-10-01 NOTE — Telephone Encounter (Signed)
Pt called and said that she rcvd her letter re: pcp retiring. Pt says that she is already under your care and is req to establish with you. Pls advise.  °

## 2011-10-01 NOTE — Telephone Encounter (Signed)
I spoke with Alexa [name] and pt is going to pick up script today.

## 2011-10-02 ENCOUNTER — Telehealth: Payer: Self-pay | Admitting: *Deleted

## 2011-10-02 NOTE — Telephone Encounter (Signed)
Home health RN informed.

## 2011-10-02 NOTE — Telephone Encounter (Signed)
Continue 5 mg daily.. As she has only had one dose. Recheck INR on Tuesday. Call to report to PCP

## 2011-10-02 NOTE — Telephone Encounter (Signed)
Spoke with [facility] RN Vernona Rieger [phone] - patient was dx with DVT and started lovenox 90 mcg Thursday. She is supposed to be doing injections bid but due to cost pt told RN that Dr Clent Ridges agreed to have her change to 90 mcg once daily. She started coumadin 5 mg once daily yesterday.   INR 1.1 PT 13.0  Should patient change her dose? When should they repeat lab?

## 2011-10-04 ENCOUNTER — Telehealth: Payer: Self-pay | Admitting: Family Medicine

## 2011-10-04 NOTE — Telephone Encounter (Signed)
Increase the Coumadin to 7.5 mg a day and recheck an INR in 3 days

## 2011-10-04 NOTE — Telephone Encounter (Signed)
Spoke with pt and [facility].

## 2011-10-04 NOTE — Telephone Encounter (Signed)
Alexa [name] from [facility] called [phone] ). Pt's INR today is 1.3 and she is on Coumadin 5 mg  1 po qd and also Lovenox 90 mg qd.

## 2011-10-05 ENCOUNTER — Encounter (HOSPITAL_BASED_OUTPATIENT_CLINIC_OR_DEPARTMENT_OTHER): Payer: Medicare Other | Attending: General Surgery

## 2011-10-05 DIAGNOSIS — L97909 Non-pressure chronic ulcer of unspecified part of unspecified lower leg with unspecified severity: Secondary | ICD-10-CM | POA: Insufficient documentation

## 2011-10-05 DIAGNOSIS — Z79899 Other long term (current) drug therapy: Secondary | ICD-10-CM | POA: Insufficient documentation

## 2011-10-05 DIAGNOSIS — I10 Essential (primary) hypertension: Secondary | ICD-10-CM | POA: Insufficient documentation

## 2011-10-05 DIAGNOSIS — Z853 Personal history of malignant neoplasm of breast: Secondary | ICD-10-CM | POA: Insufficient documentation

## 2011-10-05 DIAGNOSIS — M069 Rheumatoid arthritis, unspecified: Secondary | ICD-10-CM | POA: Insufficient documentation

## 2011-10-05 DIAGNOSIS — I83009 Varicose veins of unspecified lower extremity with ulcer of unspecified site: Secondary | ICD-10-CM | POA: Insufficient documentation

## 2011-10-05 DIAGNOSIS — R609 Edema, unspecified: Secondary | ICD-10-CM | POA: Insufficient documentation

## 2011-10-05 NOTE — H&P (Signed)
  NAME:  Alexa [name], Alexa [name]              ACCOUNT NO.:  [id number]  MEDICAL RECORD NO.:  [id number]  LOCATION:  FOOT                         FACILITY:  [facility]  PHYSICIAN:  Joanne Gavel, M.D.        DATE OF BIRTH:  Nov 27, 1936  DATE OF ADMISSION:  09/28/2011 DATE OF DISCHARGE:                             HISTORY & PHYSICAL   CHIEF COMPLAINT:  Wound, right lower extremity.  HISTORY OF PRESENT ILLNESS:  This 75 year old female, not diabetic with no history of serious vascular disease, has had a lesion on her right leg just above the lateral malleolus for approximately 3 months.  There was no history of trauma.  The patient has been treated with oral antibiotics for episodes of cellulitis, but has had no local treatment. She may have had vascular studies 6 months ago, but has had none recently.  PAST MEDICAL HISTORY:  Significant for cancer of the breast, thyroid problems, high blood pressure, and rheumatoid arthritis.  PAST SURGICAL HISTORY:  She has had double mastectomy, hysterectomy, vein stripping in the right leg in 1973, and several back operations for which she is still suffering back pain.  SOCIAL HISTORY:  Cigarettes none for 30 years.  Alcohol, none.  MEDICATIONS:  Oxycodone, morphine, furosemide, Nexium, Diovan, levothyroxine, chlorpromazine.  ALLERGIES:  PENICILLIN causes rash.  REVIEW OF SYSTEMS:  As above.  PHYSICAL EXAMINATION:  VITAL SIGNS:  Temperature 98.1, pulse 73 and regular, respirations 18, blood pressure 172/78. GENERAL APPEARANCE:  Well developed, well nourished, in no distress. HEENT:  Cranium normocephalic.  Eyes, ears, nose, throat normal. CHEST:  Clear. HEART:  Regular rhythm. EXTREMITIES:  There is normal symmetrical strength in the upper extremities.  Examination of the lower extremities reveals a 1.5 x 1.6 scab which when removed reveals a thick adherent slough.  This is located just above the lateral malleolus on the right side.  Dorsalis pedis and  posterior tibial pulses are palpable.  There is pitting edema. ABI is 1.13.  ADMITTING IMPRESSION:  Varicose venous ulcer.  PLAN OF TREATMENT: After we get vascular studies and assure that inflow is good, we will treat her with Unna boot for external compression.  In the meantime, we will treat her with Santyl Hydrogel and a Coban and Kerlix wrap.  We will see her in 7 days.     Joanne Gavel, M.D.     RA/MEDQ  D:  09/28/2011  T:  09/28/2011  Job:  [id number]  cc:   Ellin Saba., MD  Electronically Signed by Joanne Gavel M.D. on 10/05/2011 08:01:10 AM

## 2011-10-06 ENCOUNTER — Other Ambulatory Visit: Payer: Medicare Other

## 2011-10-06 ENCOUNTER — Telehealth: Payer: Self-pay | Admitting: Family Medicine

## 2011-10-06 NOTE — Telephone Encounter (Signed)
Noted. We will check an INR tomorrow

## 2011-10-06 NOTE — Telephone Encounter (Signed)
Spoke with pt

## 2011-10-06 NOTE — Telephone Encounter (Signed)
Pt wanted to let us know that she is not taking the Lovenox injections due to cost. She also missed her Coumadin dose last night. Also wants to know if the INR needs to be checked more often?

## 2011-10-07 ENCOUNTER — Telehealth: Payer: Self-pay

## 2011-10-07 NOTE — Telephone Encounter (Signed)
Stephanie from [facility] called and stated pt's INR was 2.0.  Pt has been taking 1.5 tablets daily.  Per Dr. Clent Ridges continue to take 1.5 tablet and re-check in 1 week.

## 2011-10-07 NOTE — Telephone Encounter (Signed)
Agreed -

## 2011-10-12 ENCOUNTER — Telehealth: Payer: Self-pay | Admitting: Family Medicine

## 2011-10-12 MED ORDER — WARFARIN SODIUM 5 MG PO TABS: ORAL_TABLET | ORAL | Status: DC

## 2011-10-12 NOTE — Procedures (Unsigned)
DUPLEX DEEP VENOUS EXAM - LOWER EXTREMITY  INDICATION:  Right lower extremity ulceration.  HISTORY:  Edema:  Yes. Trauma/Surgery:  No. Pain:  Yes. PE:  No. Previous DVT:  No. Anticoagulants:  No. Other:  DUPLEX EXAM:               CFV   SFV   PopV  PTV    GSV               R  L  R  L  R  L  R   L  R  L Thrombosis    o  o  o  o  o  o  o   o  A  o Spontaneous   +  +  +  +  +  +  +   +     + Phasic        +  +  +  +  +  +  +   +     + Augmentation  +  +  +  +  +  +  +   +     + Compressible  +  +  +  +  +  +  +   +     + Competent     0  0  +  0  0  0            0  Legend:  + - yes  o - no  p - partial  D - decreased  IMPRESSION: 1. Acute deep venous thrombosis present involving the right     gastrocnemius veins. 2. The remainder of the bilateral lower extremity deep venous system     appears patent with bilateral deep venous reflux >500 milliseconds. 3. Superficial venous thrombus present involving the left small     saphenous vein from the knee to mid calf segment. 4. The right great saphenous vein is absent with a history of vein     stripping. 5. Left great saphenous vein is patent and incompetent.   _____________________________ V. Charlena Cross, MD  SH/MEDQ  D:  09/30/2011  T:  09/30/2011  Job:  [id number]

## 2011-10-12 NOTE — Telephone Encounter (Signed)
Needs refills

## 2011-10-14 ENCOUNTER — Telehealth: Payer: Self-pay | Admitting: Family Medicine

## 2011-10-14 NOTE — Telephone Encounter (Signed)
[facility] ([facility]) would like to know when should pt have next protime? Pt  protime is on target 2.0 drawn 11-8.

## 2011-10-15 ENCOUNTER — Telehealth: Payer: Self-pay | Admitting: Family Medicine

## 2011-10-15 NOTE — Telephone Encounter (Signed)
Spoke with Victorino Dike from [facility], pt's INR was 5.2 and PT was 62.6. Dr. Clent Ridges is aware and advised pt to stop coumadin for 3 days and restart at 7.5 mg on Mon & Fri, then 5 mg all other days. Recheck level on Wed. 11/21/2.

## 2011-10-15 NOTE — Telephone Encounter (Signed)
Spoke with [facility] and pt does need INR today.

## 2011-10-15 NOTE — Telephone Encounter (Signed)
We already gave them this order last week. She needs an INR now

## 2011-10-17 DIAGNOSIS — M545 Low back pain, unspecified: Secondary | ICD-10-CM

## 2011-10-17 DIAGNOSIS — I82409 Acute embolism and thrombosis of unspecified deep veins of unspecified lower extremity: Secondary | ICD-10-CM

## 2011-10-17 DIAGNOSIS — L97309 Non-pressure chronic ulcer of unspecified ankle with unspecified severity: Secondary | ICD-10-CM

## 2011-10-17 DIAGNOSIS — I739 Peripheral vascular disease, unspecified: Secondary | ICD-10-CM

## 2011-10-20 ENCOUNTER — Telehealth: Payer: Self-pay | Admitting: *Deleted

## 2011-10-20 NOTE — Telephone Encounter (Signed)
Spoke with [employer]

## 2011-10-20 NOTE — Telephone Encounter (Signed)
Alexa [name] from [facility] is calling with an INR 3.6 protime 43.2 she takes her meds at 7pm 5mg . Started Monday 7.5, 5mg  Tuesday, no coumadin today since holding it for 3 days.

## 2011-10-20 NOTE — Telephone Encounter (Signed)
Stay on same dose and recheck an INR in one week

## 2011-10-26 ENCOUNTER — Ambulatory Visit (HOSPITAL_COMMUNITY)
Admission: RE | Admit: 2011-10-26 | Discharge: 2011-10-26 | Disposition: A | Discharge status: Home / Self Care | Payer: Medicare Other | Source: Ambulatory Visit | Attending: General Surgery | Admitting: General Surgery

## 2011-10-26 ENCOUNTER — Other Ambulatory Visit (HOSPITAL_BASED_OUTPATIENT_CLINIC_OR_DEPARTMENT_OTHER): Payer: Self-pay | Admitting: General Surgery

## 2011-10-26 DIAGNOSIS — S81009A Unspecified open wound, unspecified knee, initial encounter: Secondary | ICD-10-CM | POA: Insufficient documentation

## 2011-10-26 DIAGNOSIS — M79606 Pain in leg, unspecified: Secondary | ICD-10-CM

## 2011-10-26 DIAGNOSIS — M79609 Pain in unspecified limb: Secondary | ICD-10-CM | POA: Insufficient documentation

## 2011-10-26 DIAGNOSIS — M7989 Other specified soft tissue disorders: Secondary | ICD-10-CM | POA: Insufficient documentation

## 2011-10-26 DIAGNOSIS — X58XXXA Exposure to other specified factors, initial encounter: Secondary | ICD-10-CM | POA: Insufficient documentation

## 2011-10-27 ENCOUNTER — Telehealth: Payer: Self-pay | Admitting: *Deleted

## 2011-10-27 NOTE — Telephone Encounter (Signed)
Tresa Endo is calling from [facility].  Patient's INR 3.4 and Protime 40.6.  She is taking 5 mg daily.  Tresa Endo would also like to know the date of the next doppler study if possible. Her number is [phone].

## 2011-10-27 NOTE — Telephone Encounter (Signed)
Tresa Endo called from [facility] and gave lab results. PT was 40.6 and INR 3.4 and pt did miss dose last night. Pt's takes 5 mg every day except on Mon & Fri takes 7.5 mg. Per Dr. Clent Ridges- keep the same dose and recheck labs in 1 week. I spoke with Tresa Endo and gave new order.

## 2011-10-27 NOTE — Telephone Encounter (Signed)
Needs to know how much coumadin to take tonight.

## 2011-10-27 NOTE — Telephone Encounter (Signed)
Tresa Endo from [facility] is aware waiting on MD response

## 2011-11-01 ENCOUNTER — Other Ambulatory Visit: Payer: Medicare Other | Admitting: *Deleted

## 2011-11-02 ENCOUNTER — Encounter (HOSPITAL_BASED_OUTPATIENT_CLINIC_OR_DEPARTMENT_OTHER): Payer: Medicare Other | Attending: General Surgery

## 2011-11-02 DIAGNOSIS — I824Z9 Acute embolism and thrombosis of unspecified deep veins of unspecified distal lower extremity: Secondary | ICD-10-CM | POA: Insufficient documentation

## 2011-11-02 DIAGNOSIS — B964 Proteus (mirabilis) (morganii) as the cause of diseases classified elsewhere: Secondary | ICD-10-CM | POA: Insufficient documentation

## 2011-11-02 DIAGNOSIS — A4901 Methicillin susceptible Staphylococcus aureus infection, unspecified site: Secondary | ICD-10-CM | POA: Insufficient documentation

## 2011-11-02 DIAGNOSIS — L97809 Non-pressure chronic ulcer of other part of unspecified lower leg with unspecified severity: Secondary | ICD-10-CM | POA: Insufficient documentation

## 2011-11-03 ENCOUNTER — Telehealth: Payer: Self-pay

## 2011-11-03 NOTE — Telephone Encounter (Signed)
Kelly from [facility] called and stated pt's INR reading was 4.9.  Pt has been going to the wound center and has a high pain level.  Pt is now eating about 60 to 70% less than what she normally does.  Pt is not currently on antibiotics and pt has not missed a dose of coumadin.  Pls advise.

## 2011-11-03 NOTE — Telephone Encounter (Signed)
Hold the Coumadin for 3 days then restart at 5 mg every day. Recheck INR in 2 weeks

## 2011-11-03 NOTE — Telephone Encounter (Signed)
Tresa Endo with [facility] called re: pts inr reading. Tresa Endo has been informed with the info noted below.

## 2011-11-08 ENCOUNTER — Other Ambulatory Visit: Payer: Medicare Other | Admitting: *Deleted

## 2011-11-08 ENCOUNTER — Ambulatory Visit (INDEPENDENT_AMBULATORY_CARE_PROVIDER_SITE_OTHER): Payer: Medicare Other | Admitting: Family Medicine

## 2011-11-08 ENCOUNTER — Encounter: Payer: Self-pay | Admitting: Family Medicine

## 2011-11-08 VITALS — BP 130/76 | HR 86 | Temp 98.5°F | Wt 200.0 lb

## 2011-11-08 DIAGNOSIS — S91309A Unspecified open wound, unspecified foot, initial encounter: Secondary | ICD-10-CM

## 2011-11-08 DIAGNOSIS — I82409 Acute embolism and thrombosis of unspecified deep veins of unspecified lower extremity: Secondary | ICD-10-CM

## 2011-11-08 NOTE — Progress Notes (Signed)
  Subjective:    Patient ID: Alexa [name], female    DOB: [birth year], 75 y.o.   MRN: [id number]  HPI Here to follow up on right lower leg DVT and foot wound. She is going to the Wound Clinic once a week. She is making slow but steady progress, getting debridements each time. She feels well otherwise, no SOB or chest pains.    Review of Systems  Constitutional: Negative.   Respiratory: Negative.   Cardiovascular: Negative.        Objective:   Physical Exam  Constitutional: She appears well-developed and well-nourished.  Neck: No thyromegaly present.  Cardiovascular: Normal rate, regular rhythm, normal heart sounds and intact distal pulses.   Pulmonary/Chest: Effort normal and breath sounds normal.  Musculoskeletal:       Right lower leg and foot are wrapped  Lymphadenopathy:    She has no cervical adenopathy.          Assessment & Plan:  Foot wound is slowly healing. Continue on Coumadin. Recheck a leg [place] next February.

## 2011-11-17 ENCOUNTER — Telehealth: Payer: Self-pay | Admitting: Family Medicine

## 2011-11-17 NOTE — Telephone Encounter (Signed)
Kelly from [facility] called with INR result ( 5.5 ). Pt has been on Doxycycline for 1 week and now on Cipro 5oo mg 1 po bid and started this on 11/16/11 for infected wound. Current Coumadin dose is 5 mg 1 po qd. I gave this message to Dr. Clent Ridges and his response is to hold the Coumadin for 3 days and then start back on 5 mg Monday & Friday and all other days 2.5 mg, recheck INR on 11/25/11. I spoke with Tresa Endo [phone] ) and gave this information.

## 2011-11-25 ENCOUNTER — Telehealth: Payer: Self-pay | Admitting: Family Medicine

## 2011-11-25 NOTE — Telephone Encounter (Signed)
[facility] called and drew pts INR today. Reading is 2.0 and PT is 24. Dosage has been taking 2.5 mg ea day and 5 mg on Monday only. Need to know how much to take tonight. Pls call asap.

## 2011-11-25 NOTE — Telephone Encounter (Signed)
I gave information to [facility] NP for review and the response was for pt to continue with current dose of Coumadin 5 mg on Monday's and all other days 2.5 mg and repeat INR in 2 weeks. I spoke with pt and [facility] and gave instructions.

## 2011-12-07 ENCOUNTER — Encounter (HOSPITAL_BASED_OUTPATIENT_CLINIC_OR_DEPARTMENT_OTHER): Payer: Medicare Other | Attending: General Surgery

## 2011-12-07 DIAGNOSIS — Z79899 Other long term (current) drug therapy: Secondary | ICD-10-CM | POA: Insufficient documentation

## 2011-12-07 DIAGNOSIS — L97909 Non-pressure chronic ulcer of unspecified part of unspecified lower leg with unspecified severity: Secondary | ICD-10-CM | POA: Insufficient documentation

## 2011-12-07 DIAGNOSIS — I83009 Varicose veins of unspecified lower extremity with ulcer of unspecified site: Secondary | ICD-10-CM | POA: Insufficient documentation

## 2011-12-07 DIAGNOSIS — Z7901 Long term (current) use of anticoagulants: Secondary | ICD-10-CM | POA: Insufficient documentation

## 2011-12-07 DIAGNOSIS — I10 Essential (primary) hypertension: Secondary | ICD-10-CM | POA: Insufficient documentation

## 2011-12-07 DIAGNOSIS — E039 Hypothyroidism, unspecified: Secondary | ICD-10-CM | POA: Insufficient documentation

## 2011-12-07 DIAGNOSIS — Z86718 Personal history of other venous thrombosis and embolism: Secondary | ICD-10-CM | POA: Insufficient documentation

## 2011-12-10 ENCOUNTER — Telehealth: Payer: Self-pay | Admitting: *Deleted

## 2011-12-10 NOTE — Telephone Encounter (Signed)
Pt is taking Coumadin 2.5 mg q day except Monday and she takes 5 mg.  She missed her 2.5 mg last night.  PT:  18.6 INR:  1.6

## 2011-12-10 NOTE — Telephone Encounter (Signed)
Per Dr. Clent Ridges keep it the same and ck in 1 week. Don't draw unless pt has taken the night before. Kelly aware of this.

## 2011-12-15 DIAGNOSIS — M545 Low back pain: Secondary | ICD-10-CM

## 2011-12-15 DIAGNOSIS — I872 Venous insufficiency (chronic) (peripheral): Secondary | ICD-10-CM

## 2011-12-15 DIAGNOSIS — Z8672 Personal history of thrombophlebitis: Secondary | ICD-10-CM

## 2011-12-15 DIAGNOSIS — L97309 Non-pressure chronic ulcer of unspecified ankle with unspecified severity: Secondary | ICD-10-CM

## 2011-12-17 ENCOUNTER — Telehealth: Payer: Self-pay | Admitting: *Deleted

## 2011-12-17 MED ORDER — WARFARIN SODIUM 5 MG PO TABS: 5.0000 mg | ORAL_TABLET | Freq: Every day | ORAL | Status: DC

## 2011-12-17 NOTE — Telephone Encounter (Signed)
These numbers cannot be correct. I bet the INR is actually 1 point something. Please call to get this straight

## 2011-12-17 NOTE — Telephone Encounter (Signed)
Increase the Coumadin to 5 mg three days a week on Monday, Wednesday, and Friday. Take 2.5 mg other days . Recheck the INR in 2 weeks

## 2011-12-17 NOTE — Telephone Encounter (Signed)
Spoke with pt and also sent Coumadin script e-scribe, pt was almost out.

## 2011-12-17 NOTE — Telephone Encounter (Addendum)
PT:  18.2  INR:  1.5  Pt is taking 5 mg of Coumadin daily and 2.5mg  all other days.  Sorry, transcribed wrong by me.  Debby

## 2011-12-31 ENCOUNTER — Telehealth: Payer: Self-pay | Admitting: Family Medicine

## 2011-12-31 NOTE — Telephone Encounter (Signed)
Tresa Endo from [facility] called with pt's lab results. PT 18.2 and INR 1.5. Patient is taking 5 mg Coumadin 5 mg on Mon, Wed, & Fri. And all other days 2.5 mg. Per Dr. Clent Ridges take 2.5 mg on Sun & Thur, all other days take 5 mg of Coumadin and recheck in 2 weeks. I spoke with Misty Stanley and gave this information and also left a voice message for [place] at [facility]. [phone] )

## 2012-01-04 ENCOUNTER — Encounter (HOSPITAL_BASED_OUTPATIENT_CLINIC_OR_DEPARTMENT_OTHER): Payer: Medicare Other | Attending: General Surgery

## 2012-01-04 DIAGNOSIS — Z79899 Other long term (current) drug therapy: Secondary | ICD-10-CM | POA: Insufficient documentation

## 2012-01-04 DIAGNOSIS — I10 Essential (primary) hypertension: Secondary | ICD-10-CM | POA: Insufficient documentation

## 2012-01-04 DIAGNOSIS — E039 Hypothyroidism, unspecified: Secondary | ICD-10-CM | POA: Insufficient documentation

## 2012-01-04 DIAGNOSIS — G8929 Other chronic pain: Secondary | ICD-10-CM | POA: Insufficient documentation

## 2012-01-04 DIAGNOSIS — L97909 Non-pressure chronic ulcer of unspecified part of unspecified lower leg with unspecified severity: Secondary | ICD-10-CM | POA: Insufficient documentation

## 2012-01-04 DIAGNOSIS — C50919 Malignant neoplasm of unspecified site of unspecified female breast: Secondary | ICD-10-CM | POA: Insufficient documentation

## 2012-01-04 DIAGNOSIS — M069 Rheumatoid arthritis, unspecified: Secondary | ICD-10-CM | POA: Insufficient documentation

## 2012-01-04 DIAGNOSIS — Z86718 Personal history of other venous thrombosis and embolism: Secondary | ICD-10-CM | POA: Insufficient documentation

## 2012-01-04 DIAGNOSIS — Z7901 Long term (current) use of anticoagulants: Secondary | ICD-10-CM | POA: Insufficient documentation

## 2012-01-04 DIAGNOSIS — I83009 Varicose veins of unspecified lower extremity with ulcer of unspecified site: Secondary | ICD-10-CM | POA: Insufficient documentation

## 2012-01-06 ENCOUNTER — Telehealth: Payer: Self-pay | Admitting: Oncology

## 2012-01-06 NOTE — Telephone Encounter (Signed)
Talked to pt, gave her appt date for 03/24/12 lab and MD on 03/28/12

## 2012-01-11 ENCOUNTER — Encounter (HOSPITAL_BASED_OUTPATIENT_CLINIC_OR_DEPARTMENT_OTHER): Payer: Medicare Other

## 2012-01-14 ENCOUNTER — Ambulatory Visit (INDEPENDENT_AMBULATORY_CARE_PROVIDER_SITE_OTHER): Payer: Medicare Other

## 2012-01-14 DIAGNOSIS — Z7901 Long term (current) use of anticoagulants: Secondary | ICD-10-CM

## 2012-01-14 DIAGNOSIS — I82409 Acute embolism and thrombosis of unspecified deep veins of unspecified lower extremity: Secondary | ICD-10-CM | POA: Insufficient documentation

## 2012-01-14 NOTE — Patient Instructions (Signed)
  Latest dosing instructions   Total Sun Mon Tue Wed Thu Fri Sat   35 5 mg 5 mg 5 mg 5 mg 5 mg 5 mg 5 mg    (5 mg1) (5 mg1) (5 mg1) (5 mg1) (5 mg1) (5 mg1) (5 mg1)        

## 2012-01-28 ENCOUNTER — Telehealth: Payer: Self-pay | Admitting: *Deleted

## 2012-01-28 ENCOUNTER — Ambulatory Visit: Payer: Medicare Other

## 2012-01-28 NOTE — Telephone Encounter (Signed)
PT:  38.2 INR: 3.2  Taking 5 mg daily of Coumadin.  Please call pt with any changes.

## 2012-01-28 NOTE — Telephone Encounter (Signed)
Perfect, stay on same dose. Recheck in 4 weeks

## 2012-01-28 NOTE — Telephone Encounter (Signed)
Spoke with pt

## 2012-02-01 ENCOUNTER — Encounter (HOSPITAL_BASED_OUTPATIENT_CLINIC_OR_DEPARTMENT_OTHER): Payer: Medicare Other | Attending: General Surgery

## 2012-02-01 DIAGNOSIS — Z7901 Long term (current) use of anticoagulants: Secondary | ICD-10-CM | POA: Insufficient documentation

## 2012-02-01 DIAGNOSIS — Z86718 Personal history of other venous thrombosis and embolism: Secondary | ICD-10-CM | POA: Insufficient documentation

## 2012-02-01 DIAGNOSIS — I83009 Varicose veins of unspecified lower extremity with ulcer of unspecified site: Secondary | ICD-10-CM | POA: Insufficient documentation

## 2012-02-01 DIAGNOSIS — I10 Essential (primary) hypertension: Secondary | ICD-10-CM | POA: Insufficient documentation

## 2012-02-01 DIAGNOSIS — L97909 Non-pressure chronic ulcer of unspecified part of unspecified lower leg with unspecified severity: Secondary | ICD-10-CM | POA: Insufficient documentation

## 2012-02-01 DIAGNOSIS — I803 Phlebitis and thrombophlebitis of lower extremities, unspecified: Secondary | ICD-10-CM | POA: Insufficient documentation

## 2012-02-01 DIAGNOSIS — Z79899 Other long term (current) drug therapy: Secondary | ICD-10-CM | POA: Insufficient documentation

## 2012-02-02 ENCOUNTER — Other Ambulatory Visit: Payer: Self-pay | Admitting: Cardiothoracic Surgery

## 2012-02-02 DIAGNOSIS — D381 Neoplasm of uncertain behavior of trachea, bronchus and lung: Secondary | ICD-10-CM

## 2012-02-21 ENCOUNTER — Encounter: Payer: Self-pay | Admitting: Cardiology

## 2012-02-21 ENCOUNTER — Ambulatory Visit (INDEPENDENT_AMBULATORY_CARE_PROVIDER_SITE_OTHER): Payer: Medicare Other | Admitting: Cardiology

## 2012-02-21 VITALS — BP 152/78 | HR 80 | Ht 66.0 in | Wt 206.8 lb

## 2012-02-21 DIAGNOSIS — I10 Essential (primary) hypertension: Secondary | ICD-10-CM

## 2012-02-21 DIAGNOSIS — R0609 Other forms of dyspnea: Secondary | ICD-10-CM

## 2012-02-21 DIAGNOSIS — E785 Hyperlipidemia, unspecified: Secondary | ICD-10-CM

## 2012-02-21 DIAGNOSIS — I498 Other specified cardiac arrhythmias: Secondary | ICD-10-CM

## 2012-02-21 DIAGNOSIS — R0600 Dyspnea, unspecified: Secondary | ICD-10-CM

## 2012-02-21 DIAGNOSIS — R0989 Other specified symptoms and signs involving the circulatory and respiratory systems: Secondary | ICD-10-CM

## 2012-02-21 DIAGNOSIS — I2699 Other pulmonary embolism without acute cor pulmonale: Secondary | ICD-10-CM

## 2012-02-21 DIAGNOSIS — E7800 Pure hypercholesterolemia, unspecified: Secondary | ICD-10-CM

## 2012-02-21 LAB — LIPID PANEL
HDL: 39.7 mg/dL (ref >39.00)
Total CHOL/HDL Ratio: 6
Triglycerides: 215 mg/dL — ABNORMAL HIGH (ref 0.0–149.0)

## 2012-02-21 LAB — HEPATIC FUNCTION PANEL: Total Bilirubin: 0.6 mg/dL (ref 0.3–1.2)

## 2012-02-21 NOTE — Assessment & Plan Note (Addendum)
No evidence of ischemia or infarction by [facility] but below normal exercise tolerance.  Echo showed normal EF but mild elevation in pulmonary artery pressure.  I suspect that the patient's exertional dyspnea is contributed to by chronic diastolic CHF and also deconditioning.  Venous insufficiency plays a role in her lower extremity edema.  She looks euvolemic on the current dose of Lasix.  Elevation in pulmonary artery pressure may be due to elevated left atrial pressure (diastolic CHF) but would also consider the possibility of chronic thromboembolic pulmonary HTN given known right leg DVT.   - V/Q scan to rule out chronic PE - Continue current dose of Lasix, check BMET

## 2012-02-21 NOTE — Patient Instructions (Signed)
Your physician recommends that you have  a FASTING lipid profile /liver profile  Schedule an appointment for a ventilation/perfusion lung scan.  Take and record your blood pressure daily. I will call you in 2 weeks to get the readings. Luana Shu [phone]  Your physician wants you to follow-up in: 6 months with Dr Shirlee Latch. (September 2013).You will receive a reminder letter in the mail two months in advance. If you don't receive a letter, please call our office to schedule the follow-up appointment.

## 2012-02-21 NOTE — Assessment & Plan Note (Signed)
Tolerating current statin dosing without myalgias.  Will check lipids/LFTs today.

## 2012-02-21 NOTE — Assessment & Plan Note (Signed)
BP high today.  She will check her BP daily and record, we will call her for a BP check in 2 wks.

## 2012-02-21 NOTE — Progress Notes (Signed)
Subjective:    Patient ID: Alexa [name], female    DOB: 1936-08-28, 76 y.o.   MRN: [id number] PCP: Dr. Clent Ridges  This is a 76 year old female initially referred by her PCP due to shortness-of-breath with exertion. Her past medical history is significant for history of adenocarcinoma s/p radiation and mastectomy in 2002, hypertension, hyperlipidemia, lower extremity venous insufficiency, remote history of tobacco abuse, and right lung mass currently being monitored and that seems to be benign.  She has noted increased dyspnea for over a year now.  She notes dyspnea after walking about 50 yards though she does not have to stop.  She is short of breath walking up an incline.  No chest pain.    Echo in 9/12 showed normal EF with mild pulmonary hypertension.  Myoview in 9/12 showed below average exercise tolerance but no evidence for ischemia or infarction.  In November 01, 2023, she was found to have a right leg DVT and has been on warfarin since that time.  No definite cause for DVT though she does have venous insufficiency with bilateral venous varicosities.  She has a venous stasis ulcer on her right ankle.  She has not been exercising much due to the ankle ulcer.  BP is high today, 152/80. She is tolerating Crestor at 5 mg qod.   Labs (9/12): K 4, creatinine 0.9, BNP 82, LDL 163, HDL 53, TGs 80 Labs 11-01-2023): K 3.8, creatinine 0.91  PMH: 1. hypertension 2. Hyperlipidemia: Myalgias with multiple statins.  3. hypothyroidism 4. GERD 5. history of tobacco abuse; quit 25+ years ago 5. Breast adenocarcinoma s/p right simple and left modified radical mastectomy with chemotherapy and radiation in 2002.  6. cervical and lumbar herniation and DJD s/p multiple diskectomies and other back surgeries from 2002-2008 - chronic back pain being followed at the pain clinic 7. Diastolic CHF: Echo (9/12) with EF 60-65%, mild MR, moderate TR, PA systolic pressure 48 mmHg.  ETT-myoview (9/12) with 4' exercise, EF 68%, no ischemia  or infarction.  8. Venous insufficiency with venous stasis ulceration.  9. DVT right lower leg 11/01/2023  FHx: Mother died of stroke at age 67. Father died of heart attack at age 67. Sister had breast cancer.  SH:  Prior smoker. Lives in [place].   ROS: All systems reviewed and negative except as per HPI.   Current Outpatient Prescriptions  Medication Sig Dispense Refill  . furosemide (LASIX) 40 MG tablet Take 1 tablet (40 mg total) by mouth 2 (two) times daily.  60 tablet  11  . HYDROmorphone (DILAUDID) 4 MG tablet Take 4 mg by mouth every 4 (four) hours as needed.        Marland Kitchen levothyroxine (SYNTHROID) 100 MCG tablet Take 1 tablet (100 mcg total) by mouth daily.  30 tablet  11  . morphine (MSIR) 30 MG tablet Take 30 mg by mouth 3 (three) times daily.       Marland Kitchen oxyCODONE-acetaminophen (PERCOCET) 10-325 MG per tablet Take 1 tablet by mouth every 4 (four) hours as needed.        . potassium chloride SA (K-DUR,KLOR-CON) 20 MEQ tablet Take 20 mEq by mouth daily.        . prochlorperazine (COMPAZINE) 10 MG tablet Take 1 tablet (10 mg total) by mouth every 6 (six) hours as needed. For nausea  30 tablet  11  . rosuvastatin (CRESTOR) 5 MG tablet Take 1 tablet every other day  15 tablet  3  . valsartan-hydrochlorothiazide (DIOVAN-HCT) 320-25 MG per tablet Take  1 tablet by mouth daily.  30 tablet  11  . warfarin (COUMADIN) 5 MG tablet Take 1 tablet (5 mg total) by mouth daily.  30 tablet  3   Current Facility-Administered Medications  Medication Dose Route Frequency Provider Last Rate Last Dose  . cefTRIAXone (ROCEPHIN) injection 1 g  1 g Intramuscular Q24H Damian Leavell., MD   1 g at 07/22/11 1407  . cefTRIAXone (ROCEPHIN) injection 1 g  1 g Intramuscular Q24H Damian Leavell., MD   1 g at 07/23/11 1100    BP 152/78  Pulse 80  Ht 5\' 6"  (1.676 m)  Wt 206 lb 12.8 oz (93.804 kg)  BMI 33.38 kg/m2 General: NAD, overweight Neck: No JVD, no thyromegaly or thyroid nodule.    Lungs: Clear to auscultation bilaterally with normal respiratory effort. CV: Nondisplaced PMI.  Heart regular S1/S2, no S3/S4, no murmur.  Trace left ankle edema.  Right ankle is wrapped. Bilateral lower leg venous varicosities.  No carotid bruit.   Abdomen: Soft, nontender, no hepatosplenomegaly, no distention.  Neurologic: Alert and oriented x 3.  Psych: Anxious Extremities: No clubbing or cyanosis.

## 2012-02-29 ENCOUNTER — Encounter (HOSPITAL_BASED_OUTPATIENT_CLINIC_OR_DEPARTMENT_OTHER): Payer: Medicare Other | Attending: General Surgery

## 2012-02-29 DIAGNOSIS — I83009 Varicose veins of unspecified lower extremity with ulcer of unspecified site: Secondary | ICD-10-CM | POA: Insufficient documentation

## 2012-02-29 DIAGNOSIS — Z86718 Personal history of other venous thrombosis and embolism: Secondary | ICD-10-CM | POA: Insufficient documentation

## 2012-02-29 DIAGNOSIS — I10 Essential (primary) hypertension: Secondary | ICD-10-CM | POA: Insufficient documentation

## 2012-02-29 DIAGNOSIS — I803 Phlebitis and thrombophlebitis of lower extremities, unspecified: Secondary | ICD-10-CM | POA: Insufficient documentation

## 2012-02-29 DIAGNOSIS — Z79899 Other long term (current) drug therapy: Secondary | ICD-10-CM | POA: Insufficient documentation

## 2012-02-29 DIAGNOSIS — Z7901 Long term (current) use of anticoagulants: Secondary | ICD-10-CM | POA: Insufficient documentation

## 2012-03-01 ENCOUNTER — Other Ambulatory Visit: Payer: Medicare Other

## 2012-03-01 ENCOUNTER — Other Ambulatory Visit: Payer: Self-pay | Admitting: Cardiology

## 2012-03-01 ENCOUNTER — Encounter (HOSPITAL_COMMUNITY)
Admission: RE | Admit: 2012-03-01 | Discharge: 2012-03-01 | Disposition: A | Discharge status: Home / Self Care | Payer: Medicare Other | Source: Ambulatory Visit | Attending: Cardiology | Admitting: Cardiology

## 2012-03-01 ENCOUNTER — Ambulatory Visit (HOSPITAL_COMMUNITY)
Admission: RE | Admit: 2012-03-01 | Discharge: 2012-03-01 | Disposition: A | Discharge status: Home / Self Care | Payer: Medicare Other | Source: Ambulatory Visit | Attending: Cardiology | Admitting: Cardiology

## 2012-03-01 DIAGNOSIS — R0989 Other specified symptoms and signs involving the circulatory and respiratory systems: Secondary | ICD-10-CM

## 2012-03-01 DIAGNOSIS — I2699 Other pulmonary embolism without acute cor pulmonale: Secondary | ICD-10-CM

## 2012-03-01 DIAGNOSIS — R0609 Other forms of dyspnea: Secondary | ICD-10-CM

## 2012-03-01 DIAGNOSIS — R0602 Shortness of breath: Secondary | ICD-10-CM | POA: Insufficient documentation

## 2012-03-01 MED ORDER — TECHNETIUM TO 99M ALBUMIN AGGREGATED
3.0000 | Freq: Once | INTRAVENOUS | Status: AC | PRN
  Administered 2012-03-01: 3 via INTRAVENOUS

## 2012-03-02 ENCOUNTER — Other Ambulatory Visit: Payer: Self-pay | Admitting: *Deleted

## 2012-03-02 ENCOUNTER — Other Ambulatory Visit (INDEPENDENT_AMBULATORY_CARE_PROVIDER_SITE_OTHER): Payer: Medicare Other

## 2012-03-02 DIAGNOSIS — I509 Heart failure, unspecified: Secondary | ICD-10-CM

## 2012-03-02 DIAGNOSIS — E785 Hyperlipidemia, unspecified: Secondary | ICD-10-CM

## 2012-03-02 DIAGNOSIS — I5032 Chronic diastolic (congestive) heart failure: Secondary | ICD-10-CM

## 2012-03-02 DIAGNOSIS — I10 Essential (primary) hypertension: Secondary | ICD-10-CM

## 2012-03-02 LAB — HEPATIC FUNCTION PANEL
AST: 20 U/L (ref 0–37)
Bilirubin, Direct: 0.1 mg/dL (ref 0.0–0.3)
Total Bilirubin: 0.7 mg/dL (ref 0.3–1.2)

## 2012-03-02 LAB — LIPID PANEL
Total CHOL/HDL Ratio: 4
Triglycerides: 85 mg/dL (ref 0.0–149.0)

## 2012-03-02 MED ORDER — ROSUVASTATIN CALCIUM 5 MG PO TABS: 5.0000 mg | ORAL_TABLET | Freq: Every day | ORAL | Status: DC

## 2012-03-07 ENCOUNTER — Telehealth: Payer: Self-pay | Admitting: *Deleted

## 2012-03-07 NOTE — Telephone Encounter (Signed)
Fu call Patient calling back with info

## 2012-03-07 NOTE — Telephone Encounter (Signed)
HYPERTENSION - Marca Ancona, MD 02/21/2012 10:55 PM Signed  BP high today. She will check her BP daily and record, we will call her for a BP check in 2 wks.   03/07/12--spoke with pt. Pt does not have a log of her BP readings with her. She will call me back later today to give me her recent readings.

## 2012-03-07 NOTE — Telephone Encounter (Signed)
BP ok, no changes.  

## 2012-03-07 NOTE — Telephone Encounter (Signed)
03/07/12 talked with pt. Recent BP readings starting -- 02/22/12 128/70   135/74 140/83 132/75 140/80  107/66   122/84  144/74 142/75 139/73  138/76  137/64 136/78  141/78 144/77 139/70 149/83 128/71 143/73 128/70 134/76 140/77 143/77 I will forward to Dr Shirlee Latch for review

## 2012-03-09 NOTE — Telephone Encounter (Signed)
Spoke with pt

## 2012-03-23 ENCOUNTER — Ambulatory Visit: Payer: Medicare Other | Admitting: Family Medicine

## 2012-03-23 ENCOUNTER — Ambulatory Visit: Payer: Medicare Other | Admitting: Cardiothoracic Surgery

## 2012-03-23 ENCOUNTER — Other Ambulatory Visit: Payer: Medicare Other

## 2012-03-24 ENCOUNTER — Other Ambulatory Visit (HOSPITAL_BASED_OUTPATIENT_CLINIC_OR_DEPARTMENT_OTHER): Payer: Medicare Other | Admitting: Lab

## 2012-03-24 ENCOUNTER — Ambulatory Visit (INDEPENDENT_AMBULATORY_CARE_PROVIDER_SITE_OTHER): Payer: Medicare Other | Admitting: Family Medicine

## 2012-03-24 ENCOUNTER — Encounter: Payer: Self-pay | Admitting: Family Medicine

## 2012-03-24 VITALS — BP 130/82 | HR 73 | Temp 97.8°F | Wt 199.0 lb

## 2012-03-24 DIAGNOSIS — I82409 Acute embolism and thrombosis of unspecified deep veins of unspecified lower extremity: Secondary | ICD-10-CM

## 2012-03-24 DIAGNOSIS — M549 Dorsalgia, unspecified: Secondary | ICD-10-CM

## 2012-03-24 DIAGNOSIS — N390 Urinary tract infection, site not specified: Secondary | ICD-10-CM

## 2012-03-24 DIAGNOSIS — C50519 Malignant neoplasm of lower-outer quadrant of unspecified female breast: Secondary | ICD-10-CM

## 2012-03-24 LAB — POCT URINALYSIS DIPSTICK
Glucose, UA: NEGATIVE
Nitrite, UA: NEGATIVE
Urobilinogen, UA: 0.2

## 2012-03-24 LAB — COMPREHENSIVE METABOLIC PANEL
AST: 24 U/L (ref 0–37)
Albumin: 4.4 g/dL (ref 3.5–5.2)
Alkaline Phosphatase: 74 U/L (ref 39–117)
Potassium: 3.6 mEq/L (ref 3.5–5.3)
Sodium: 141 mEq/L (ref 135–145)
Total Protein: 7.3 g/dL (ref 6.0–8.3)

## 2012-03-24 LAB — CBC WITH DIFFERENTIAL/PLATELET
BASO%: 0.7 % (ref 0.0–2.0)
Eosinophils Absolute: 0.3 10*3/uL (ref 0.0–0.5)
HCT: 35.6 % (ref 34.8–46.6)
LYMPH%: 25.2 % (ref 14.0–49.7)
MCHC: 33.5 g/dL (ref 31.5–36.0)
MCV: 86.1 fL (ref 79.5–101.0)
MONO#: 0.6 10*3/uL (ref 0.1–0.9)
NEUT%: 61 % (ref 38.4–76.8)
Platelets: 248 10*3/uL (ref 145–400)
WBC: 6.8 10*3/uL (ref 3.9–10.3)

## 2012-03-24 MED ORDER — CIPROFLOXACIN HCL 500 MG PO TABS: 500.0000 mg | ORAL_TABLET | Freq: Two times a day (BID) | ORAL | Status: AC

## 2012-03-24 NOTE — Progress Notes (Signed)
  Subjective:    Patient ID: Alexa [name], female    DOB: [birth year], 76 y.o.   MRN: [id number]  HPI Here to check on 3 days of urinary urgency and a foul odor to the urine. No burning or fever. She also asks about the DVT in her right leg. This was diagnosed on 09-30-11, and she has been on Coumadin since then. The leg feels fine, no pain. She plans to be released from the Wound Clinic soon.    Review of Systems  Constitutional: Negative.   Gastrointestinal: Negative.   Genitourinary: Positive for dysuria, urgency and frequency. Negative for hematuria, flank pain and difficulty urinating.  Musculoskeletal: Negative.        Objective:   Physical Exam  Constitutional: She appears well-developed and well-nourished.  Abdominal: Soft. Bowel sounds are normal. She exhibits no distension and no mass. There is no tenderness. There is no rebound and no guarding.  Musculoskeletal: She exhibits no edema and no tenderness.          Assessment & Plan:  Culture the urine. Set up a 6 month follow up doppler on the leg.

## 2012-03-24 NOTE — Progress Notes (Signed)
Addended by: Aniceto Boss A on: 03/24/2012 01:27 PM   Modules accepted: Orders

## 2012-03-26 LAB — URINE CULTURE

## 2012-03-28 ENCOUNTER — Telehealth: Payer: Self-pay | Admitting: Oncology

## 2012-03-28 ENCOUNTER — Ambulatory Visit (HOSPITAL_BASED_OUTPATIENT_CLINIC_OR_DEPARTMENT_OTHER): Payer: Medicare Other | Admitting: Oncology

## 2012-03-28 VITALS — BP 155/76 | HR 78 | Temp 97.5°F | Ht 63.0 in | Wt 202.5 lb

## 2012-03-28 DIAGNOSIS — C50919 Malignant neoplasm of unspecified site of unspecified female breast: Secondary | ICD-10-CM

## 2012-03-28 DIAGNOSIS — R911 Solitary pulmonary nodule: Secondary | ICD-10-CM

## 2012-03-28 DIAGNOSIS — Z170 Estrogen receptor positive status [ER+]: Secondary | ICD-10-CM

## 2012-03-28 DIAGNOSIS — Z86718 Personal history of other venous thrombosis and embolism: Secondary | ICD-10-CM

## 2012-03-28 DIAGNOSIS — C50519 Malignant neoplasm of lower-outer quadrant of unspecified female breast: Secondary | ICD-10-CM

## 2012-03-28 NOTE — Telephone Encounter (Signed)
gv pt appt schedule for April 2014.  °

## 2012-03-29 NOTE — Progress Notes (Signed)
Quick Note:  Spoke with pt ______ 

## 2012-03-29 NOTE — Progress Notes (Signed)
Hematology and Oncology Follow Up Visit  Alexa [name] [id number] 02/09/1936 76 y.o. 03/29/2012 9:51 AM   Principle Diagnosis: Encounter Diagnosis  Name Primary?  . Breast cancer, stage 1, estrogen receptor positive Yes     Interim History:   Followup visit for this 76 year old woman with history of stage I 1 ER positive cancer of the left breast diagnosed in July 2002. She underwent a left modified radical mastectomy with lymph node dissection and an elective right simple mastectomy. Tumor in the left breast was 1.7 cm. Invasive ductal cell histology. No vascular or lymphatic invasion. Grade 3. No cancer detected in 14 lymph nodes. Pathologic stage TI, N0, M0, estrogen receptor positive, HER-2 negative. Following surgery she received hormonal therapy with 5 years of Arimidex. She has 3 daughters and was referred for genetic counseling and breast cancer gene testing. At time of referral she could not afford the genetic testing. I don't see that this was subsequently done.  She developed a vague nodular infiltrate in the right upper lung seen on a CT scan of the chest 01/08/2010. This was probably present as far back as March of 2009 but was more prominent over an area of 2 x 1.9 cm on the February 2011 study. No mediastinal or hilar lymphadenopathy. She subsequently underwent bronchoscopy and biopsies which were not diagnostic done 09/03/2010. She has been followed by cardiovascular surgery and is due for followup CT scan next month. Most recent study done in 06/10/2011 showed a stable area of abnormality right upper lobe described as mixed ground glass and soft tissue attenuation "suspicious for bronchiolo- alveolar carcinoma"   She denies any cough, hemoptysis, chest pain, palpitations, bone pain, vaginal bleeding. No headache or change in vision.  In reviewing her medication list, I noted  that she is now on Coumadin and I questioned her about this. She informed me that beginning in May of  2012 she developed a ulcer on her right leg. It sounds like she also had cellulitis. She was treated with a prolonged course of oral antibiotics for the next few months. The ulcer did not heal. She developed swelling of the right leg in October and was found to have a clot in the veins in her groin. She was evaluated by one of the lobe our cardiologists and was started on Coumadin. She was referred to the wound center and has been getting aggressive local care and the ulcer is healing. I found the report of a venous duplex study done on 10/13/2011 which confirms that she did have acute venous thrombosis in the right gastrocnemius veins with additional superficial thrombosis in the left small saphenous vein.    Medications: reviewed  Allergies:  Allergies  Allergen Reactions  . Hydrocodone-Homatropine     REACTION: Nausea  . Nsaids   . Penicillins     REACTION: rash  . Simvastatin     REACTION: muscle pain  . Strawberry     Review of Systems: Constitutional:   No constitutional symptoms Respiratory: See above Cardiovascular:  See above Gastrointestinal: No change in bowel habit Genito-Urinary: See above Musculoskeletal: See above Neurologic: See above Skin: No rash or ecchymosis Remaining ROS negative.  Physical Exam: Blood pressure 155/76, pulse 78, temperature 97.5 F (36.4 C), temperature source Oral, height 5\' 3"  (1.6 m), weight 202 lb 8 oz (91.853 kg). Wt Readings from Last 3 Encounters:  03/28/12 202 lb 8 oz (91.853 kg)  03/24/12 199 lb (90.266 kg)  02/21/12 206 lb 12.8 oz (93.804 kg)  General appearance: Well-nourished Caucasian woman HENNT: Pharynx no erythema exudate or mass Lymph nodes: No cervical supraclavicular or axillary adenopathy Breasts: Bilateral mastectomies. Radiation changes with hypervascularity left chest wall. No cutaneous lesions. Lungs: Clear to auscultation resonant to percussion Heart: Regular rhythm no murmur Abdomen: Soft nontender no mass  no organomegaly Extremities: No edema no calf tenderness. She has a dressing from foot to knee covering the right leg just change this morning at the wound center which I did not remove. Vascular: No cyanosis Neurologic: Motor strength 5 over 5, reflexes 1+ symmetric, Skin: No rash or ecchymosis  Lab Results: Lab Results  Component Value Date   WBC 6.8 03/24/2012   HGB 11.9 03/24/2012   HCT 35.6 03/24/2012   MCV 86.1 03/24/2012   PLT 248 03/24/2012     Chemistry      Component Value Date/Time   NA 141 03/24/2012 0939   K 3.6 03/24/2012 0939   CL 102 03/24/2012 0939   CO2 29 03/24/2012 0939   BUN 34* 03/24/2012 0939   CREATININE 1.37* 03/24/2012 0939      Component Value Date/Time   CALCIUM 9.1 03/24/2012 0939   ALKPHOS 74 03/24/2012 0939   AST 24 03/24/2012 0939   ALT 14 03/24/2012 0939   BILITOT 0.9 03/24/2012 0939       Radiological Studies: See discussion above   Impression and Plan:  1.  Stage I ER positive, HER-2/neu negative cancer of the left breast.  She remains free of any obvious new disease, now out over  11 years from diagnosis in July 2002. 2.  abnormal CT chest with atypical infiltrate over an area of 2 x 2 cm in a former smoker. See discussion above.  She has been getting periodic CT scans since this abnormality was first noted on a scan done 02/16/2008.  Biopsies done 08/2005 were all negative.  Subsequent CTs have been stable.  She will get another scan again in May through Dr. Dennie Maizes office. 3.  Degenerative arthritis:  Status post previous back surgery and now recent right rotator cuff repair. 4.  Hypothyroid:  On replacement. 5.  Essential hypertension. 6. Nonhealing ulcer with associated cellulitis right leg see discussion above. 7. Acute DVT related to #6. Currently on Coumadin. I expect this will be stopped soon.   CC:. Dr. Gershon Crane; Dr. Sheliah Plane; Dr. Doristine Johns cardiology   Levert Feinstein, MD 5/1/20139:51 AM

## 2012-04-03 ENCOUNTER — Encounter (HOSPITAL_BASED_OUTPATIENT_CLINIC_OR_DEPARTMENT_OTHER): Payer: Medicare Other | Attending: Internal Medicine

## 2012-04-03 DIAGNOSIS — E785 Hyperlipidemia, unspecified: Secondary | ICD-10-CM | POA: Insufficient documentation

## 2012-04-03 DIAGNOSIS — Z86718 Personal history of other venous thrombosis and embolism: Secondary | ICD-10-CM | POA: Insufficient documentation

## 2012-04-03 DIAGNOSIS — K219 Gastro-esophageal reflux disease without esophagitis: Secondary | ICD-10-CM | POA: Insufficient documentation

## 2012-04-03 DIAGNOSIS — I10 Essential (primary) hypertension: Secondary | ICD-10-CM | POA: Insufficient documentation

## 2012-04-03 DIAGNOSIS — L97809 Non-pressure chronic ulcer of other part of unspecified lower leg with unspecified severity: Secondary | ICD-10-CM | POA: Insufficient documentation

## 2012-04-03 DIAGNOSIS — Z853 Personal history of malignant neoplasm of breast: Secondary | ICD-10-CM | POA: Insufficient documentation

## 2012-04-03 DIAGNOSIS — Z79899 Other long term (current) drug therapy: Secondary | ICD-10-CM | POA: Insufficient documentation

## 2012-04-04 ENCOUNTER — Encounter (HOSPITAL_BASED_OUTPATIENT_CLINIC_OR_DEPARTMENT_OTHER): Payer: Medicare Other

## 2012-04-06 ENCOUNTER — Ambulatory Visit
Admission: RE | Admit: 2012-04-06 | Discharge: 2012-04-06 | Disposition: A | Discharge status: Home / Self Care | Payer: Medicare Other | Source: Ambulatory Visit | Attending: Cardiothoracic Surgery | Admitting: Cardiothoracic Surgery

## 2012-04-06 ENCOUNTER — Ambulatory Visit (INDEPENDENT_AMBULATORY_CARE_PROVIDER_SITE_OTHER): Payer: Medicare Other | Admitting: Cardiothoracic Surgery

## 2012-04-06 ENCOUNTER — Encounter: Payer: Self-pay | Admitting: Cardiothoracic Surgery

## 2012-04-06 VITALS — BP 150/67 | HR 72 | Resp 20 | Ht 63.0 in | Wt 204.0 lb

## 2012-04-06 DIAGNOSIS — D381 Neoplasm of uncertain behavior of trachea, bronchus and lung: Secondary | ICD-10-CM

## 2012-04-06 DIAGNOSIS — R222 Localized swelling, mass and lump, trunk: Secondary | ICD-10-CM

## 2012-04-06 NOTE — Progress Notes (Signed)
[address]            [name] [address]          [phone]        Inez Pilgrim Date of Birth: August 02, 1936  [name], MD Nelwyn Salisbury, MD, MD  Chief Complaint:  Chief Complaint  Patient presents with  . Follow-up    6 month f/u with Chest CT, surveillance on lung mass    History of Present Illness:  Patient returns today for followup visit concerning possible right lung lesion following  her since March of 2011 when a CT scan done March 1 raised the issue of a right lung lesion in looking back at old films this infiltrative process has been present since 2009. ENB biopsy by Dr Edwyna Shell was negative on 09/07/2010 but because of the question of possible slow-growing bronchial alveolar carcinoma presenting in this fashion it continued to follow the patient carefully. Since last seen she has had difficulty with nonhealing ulcer in the right leg, and question of DVT and is currently on Coumadin. She notes her respiratory status has been stable without increasing shortness of breath.  Past Medical History  Diagnosis Date  . Hyperlipidemia   . Hypertension   . Cancer     breast  . GERD (gastroesophageal reflux disease)   . Diverticulosis   . Hx of adenomatous colonic polyps   . DVT (deep venous thrombosis) 09-30-11    right lower leg   . Arthritis     Past Surgical History  Procedure Date  . Cholecystectomy 2007  . Breast surgery 2002    BL mastectomy  . Lumbar laminectomy   . Rotator cuff repair   . Cervical spine surgery   . Hip surgery   . Spine surgery   . Abdominal hysterectomy   . Fiberoptic bronchoscopy with electromagnetic 10/07/2010    History  Smoking status  . Former Smoker  Smokeless tobacco  . Never Used    History  Alcohol Use No    History   Social History  . Marital Status: Widowed    Spouse Name: N/A    Number of Children: N/A  . Years of Education: N/A   Occupational History  . Not  on file.   Social History Main Topics  . Smoking status: Former Games developer  . Smokeless tobacco: Never Used  . Alcohol Use: No  . Drug Use: No  . Sexually Active: No   Other Topics Concern  . Not on file   Social History Narrative  . No narrative on file    Allergies  Allergen Reactions  . Hydrocodone-Homatropine     REACTION: Nausea  . Nsaids   . Penicillins     REACTION: rash  . Simvastatin     REACTION: muscle pain  . Strawberry     Current Outpatient Prescriptions  Medication Sig Dispense Refill  . furosemide (LASIX) 40 MG tablet Take 1 tablet (40 mg total) by mouth 2 (two) times daily.  60 tablet  11  . levothyroxine (SYNTHROID) 100 MCG tablet Take 1 tablet (100 mcg total) by mouth daily.  30 tablet  11  . morphine (MSIR) 30 MG tablet Take 30 mg by mouth 3 (three) times daily.       Marland Kitchen oxyCODONE-acetaminophen (PERCOCET) 10-325 MG per tablet Take 1 tablet by mouth every 4 (four) hours as  needed.        . prochlorperazine (COMPAZINE) 10 MG tablet Take 1 tablet (10 mg total) by mouth every 6 (six) hours as needed. For nausea  30 tablet  11  . rosuvastatin (CRESTOR) 5 MG tablet Take 1 tablet (5 mg total) by mouth at bedtime.  30 tablet  3  . valsartan-hydrochlorothiazide (DIOVAN-HCT) 320-25 MG per tablet Take 1 tablet by mouth daily.  30 tablet  11  . warfarin (COUMADIN) 5 MG tablet Take 1 tablet (5 mg total) by mouth daily.  30 tablet  3  . DISCONTD: warfarin (COUMADIN) 5 MG tablet Take as directed  60 tablet  11     Family History  Problem Relation Age of Onset  . Diabetes Daughter   . Diabetes Daughter   . Heart disease Father   . Heart disease Brother   . Heart disease Sister   . Colon cancer Neg Hx   . Colon polyps Daughter        Physical Exam: BP 150/67  Pulse 72  Resp 20  Ht 5\' 3"  (1.6 m)  Wt 204 lb (92.534 kg)  BMI 36.14 kg/m2  SpO2 98%  Overall the patient appears in no distress she is not short of breath at rest she has no jugular venous  distention I do not appreciate any cervical or supraclavicular adenopathy or axillary adenopathy Her lungs are clear bilaterally Cardiac exam feels regular rate and rhythm without murmur gallop, she's had previous bilateral mastectomy Lower extremity is have 2+ pedal edema bilaterally the patient  The right leg is wrapped with an Ace wrap from her treatments in the wound care center  Diagnostic Studies & Laboratory data:    I have reviewed the patient's previous scans from 2009 until today with her.   Ct Chest Wo Contrast  04/06/2012  **ADDENDUM** CREATED: 04/06/2012 11:56:03  The clinical service requested that we specifically compare this study to the exam of 02/16/2008. When compared to that exam, there has been minimal interval enlargement in the posterior right upper lobe lesion.  When remeasured in transverse dimension, this measured 2.2 x 1.6 cm (image 36 series 7 of that exam).  In greatest cranial caudal dimension, this measured 1.6 cm on that exam.  As low grade adenocarcinoma can remain similar or slightly progressive over such a time course, this remains a concern, given lesion morphology.  **END ADDENDUM** SIGNED BY: Karn Cassis. Reche Dixon, M.D.    04/06/2012  *RADIOLOGY REPORT*  Clinical Data: Right upper lobe nodule diagnosed 08/11.  History of bilateral mastectomy in 2002 for cancer.  Radiation therapy.  Ex- smoker.  CT CHEST WITHOUT CONTRAST  Technique:  Multidetector CT imaging of the chest was performed following the standard protocol without IV contrast.  Comparison: 06/10/2011  Findings: Lung windows demonstrate posterior right upper lobe soft tissue and ground-glass attenuation lesion is again identified. This measures 2.3 x 2.5 x 1.9 cm on transverse image 23 and sagittal image 45.  On the prior, this measured 2.4 x 2.5 x 1.9 cm at the same level.  Soft tissue windows demonstrate lower cervical spine fixation. No supraclavicular adenopathy.  Bilateral mastectomy and left axillary node  dissection.  Tortuous descending thoracic aorta.  Mild cardiomegaly. No pericardial or pleural effusion.  A right paratracheal node measures 9 mm on image 78 and is unchanged. Stable small subcarinal node.  The branch pulmonary arteries are enlarged.  Right pulmonary artery measures 2.9 cm.  Limited evaluation for hilar adenopathy, given lack of IV  contrast.  No internal mammary adenopathy.  Limited abdominal imaging demonstrates beam hardening artifact from lower thoracic spine fixation.  Old granulomatous disease in the spleen. Normal adrenal glands.  Cholecystectomy.  Right rotator cuff repair.  IMPRESSION:  1.  Similar appearance of mixed ground-glass and soft tissue attenuation nodule in the posterior right upper lobe.   This remains suspicious for a low grade adenocarcinoma. 2. Pulmonary artery enlargement suggests pulmonary arterial hypertension.  Original Report Authenticated By: Consuello Bossier, M.D.      Assessment / Plan:  I reviewed the findings with the patient, and compared the CT scans from 2009 until current scan done today. 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 At this point with very little change since 2009, and previous negative biopsies on ENB. She prefers not to proceed with surgical resection. We will continue to follow it closely she has been getting CT scans every 6 months will extend this out to one year, and get a low dose CT scan to measure the size of the right lung lesion.    Delight Ovens MD

## 2012-04-13 ENCOUNTER — Other Ambulatory Visit: Payer: Self-pay | Admitting: Cardiology

## 2012-04-13 NOTE — Telephone Encounter (Signed)
Refilled crestor 

## 2012-05-01 ENCOUNTER — Encounter (HOSPITAL_BASED_OUTPATIENT_CLINIC_OR_DEPARTMENT_OTHER): Payer: Medicare Other

## 2012-05-02 ENCOUNTER — Encounter (HOSPITAL_BASED_OUTPATIENT_CLINIC_OR_DEPARTMENT_OTHER): Payer: Medicare Other

## 2012-05-02 ENCOUNTER — Encounter (HOSPITAL_BASED_OUTPATIENT_CLINIC_OR_DEPARTMENT_OTHER): Payer: Medicare Other | Attending: Internal Medicine

## 2012-05-02 DIAGNOSIS — L97809 Non-pressure chronic ulcer of other part of unspecified lower leg with unspecified severity: Secondary | ICD-10-CM | POA: Insufficient documentation

## 2012-05-02 DIAGNOSIS — I872 Venous insufficiency (chronic) (peripheral): Secondary | ICD-10-CM | POA: Insufficient documentation

## 2012-05-02 DIAGNOSIS — Z79899 Other long term (current) drug therapy: Secondary | ICD-10-CM | POA: Insufficient documentation

## 2012-05-02 DIAGNOSIS — I10 Essential (primary) hypertension: Secondary | ICD-10-CM | POA: Insufficient documentation

## 2012-05-04 ENCOUNTER — Other Ambulatory Visit: Payer: Medicare Other

## 2012-05-30 ENCOUNTER — Encounter (HOSPITAL_BASED_OUTPATIENT_CLINIC_OR_DEPARTMENT_OTHER): Payer: Medicare Other | Attending: General Surgery

## 2012-05-30 DIAGNOSIS — I872 Venous insufficiency (chronic) (peripheral): Secondary | ICD-10-CM | POA: Insufficient documentation

## 2012-05-30 DIAGNOSIS — I83009 Varicose veins of unspecified lower extremity with ulcer of unspecified site: Secondary | ICD-10-CM | POA: Insufficient documentation

## 2012-05-30 NOTE — Progress Notes (Signed)
[facility]  NAME:  [name], Alexa              ACCOUNT NO.:  [id number]  MEDICAL RECORD NO.:  [id number]      DATE OF BIRTH:  12-Apr-1936  PHYSICIAN:  Joanne Gavel, M.D.         VISIT DATE:  05/30/2012                                  OFFICE VISIT   ADDRESS:  BODY:  To Whom It May Concern:  Ms. Alexa [name] has been a patient of ours for several months.  She has undergone 10 Oasis treatments for a traumatic wound in the midst of severe varicose venous stasis.  At the time of we first saw her, she had an acute thrombophlebitis which has been treated.  The wound has first responded quite well but for the past month, there has been essentially no improvement.  The base of the wound is clean and I believe she is a very good candidate for Apligraf application.     Joanne Gavel, M.D.     RA/MEDQ  D:  05/30/2012  T:  05/30/2012  Job:  [id number]

## 2012-06-21 ENCOUNTER — Ambulatory Visit (INDEPENDENT_AMBULATORY_CARE_PROVIDER_SITE_OTHER): Payer: Medicare Other | Admitting: Family

## 2012-06-21 DIAGNOSIS — I82409 Acute embolism and thrombosis of unspecified deep veins of unspecified lower extremity: Secondary | ICD-10-CM

## 2012-06-21 LAB — POCT INR: INR: 1.7

## 2012-06-21 NOTE — Patient Instructions (Addendum)
Take 1 1/2 tabs today only (7.5mg ). 5 mg everyday and check in 3 weeks    Latest dosing instructions   Total Sun Mon Tue Wed Thu Fri Sat   35 5 mg 5 mg 5 mg 5 mg 5 mg 5 mg 5 mg    (5 mg1) (5 mg1) (5 mg1) (5 mg1) (5 mg1) (5 mg1) (5 mg1)

## 2012-07-04 ENCOUNTER — Encounter (HOSPITAL_BASED_OUTPATIENT_CLINIC_OR_DEPARTMENT_OTHER): Payer: Medicare Other | Attending: General Surgery

## 2012-07-04 ENCOUNTER — Encounter (HOSPITAL_BASED_OUTPATIENT_CLINIC_OR_DEPARTMENT_OTHER): Payer: Medicare Other

## 2012-07-04 DIAGNOSIS — Z86718 Personal history of other venous thrombosis and embolism: Secondary | ICD-10-CM | POA: Insufficient documentation

## 2012-07-04 DIAGNOSIS — I872 Venous insufficiency (chronic) (peripheral): Secondary | ICD-10-CM | POA: Insufficient documentation

## 2012-07-04 DIAGNOSIS — Z7901 Long term (current) use of anticoagulants: Secondary | ICD-10-CM | POA: Insufficient documentation

## 2012-07-04 DIAGNOSIS — L97809 Non-pressure chronic ulcer of other part of unspecified lower leg with unspecified severity: Secondary | ICD-10-CM | POA: Insufficient documentation

## 2012-07-04 DIAGNOSIS — I10 Essential (primary) hypertension: Secondary | ICD-10-CM | POA: Insufficient documentation

## 2012-07-04 DIAGNOSIS — Z79899 Other long term (current) drug therapy: Secondary | ICD-10-CM | POA: Insufficient documentation

## 2012-07-04 DIAGNOSIS — M069 Rheumatoid arthritis, unspecified: Secondary | ICD-10-CM | POA: Insufficient documentation

## 2012-07-12 ENCOUNTER — Ambulatory Visit (INDEPENDENT_AMBULATORY_CARE_PROVIDER_SITE_OTHER): Payer: Medicare Other | Admitting: Family

## 2012-07-12 DIAGNOSIS — I82409 Acute embolism and thrombosis of unspecified deep veins of unspecified lower extremity: Secondary | ICD-10-CM

## 2012-07-12 NOTE — Patient Instructions (Addendum)
  Latest dosing instructions   Total Sun Mon Tue Wed Thu Fri Sat   35 5 mg 5 mg 5 mg 5 mg 5 mg 5 mg 5 mg    (5 mg1) (5 mg1) (5 mg1) (5 mg1) (5 mg1) (5 mg1) (5 mg1)        5 mg everyday and check in 4 weeks

## 2012-07-13 ENCOUNTER — Other Ambulatory Visit: Payer: Self-pay | Admitting: Family Medicine

## 2012-07-18 ENCOUNTER — Telehealth: Payer: Self-pay | Admitting: Family Medicine

## 2012-07-18 MED ORDER — WARFARIN SODIUM 5 MG PO TABS: 5.0000 mg | ORAL_TABLET | Freq: Every day | ORAL | Status: DC

## 2012-07-18 NOTE — Telephone Encounter (Signed)
Refill request for Coumadin 5 mg take 1 po qd.

## 2012-07-18 NOTE — Telephone Encounter (Signed)
I sent script e-scribe and Dr. Clent Ridges did approve.

## 2012-08-09 ENCOUNTER — Other Ambulatory Visit: Payer: Self-pay | Admitting: Family Medicine

## 2012-08-09 ENCOUNTER — Encounter: Payer: Medicare Other | Admitting: Family

## 2012-08-11 ENCOUNTER — Ambulatory Visit (INDEPENDENT_AMBULATORY_CARE_PROVIDER_SITE_OTHER): Payer: Medicare Other | Admitting: Family

## 2012-08-11 DIAGNOSIS — I82409 Acute embolism and thrombosis of unspecified deep veins of unspecified lower extremity: Secondary | ICD-10-CM

## 2012-08-11 NOTE — Patient Instructions (Signed)
5 mg everyday and check in 4 weeks    Latest dosing instructions   Total Sun Mon Tue Wed Thu Fri Sat   35 5 mg 5 mg 5 mg 5 mg 5 mg 5 mg 5 mg    (5 mg1) (5 mg1) (5 mg1) (5 mg1) (5 mg1) (5 mg1) (5 mg1)

## 2012-08-28 ENCOUNTER — Ambulatory Visit (INDEPENDENT_AMBULATORY_CARE_PROVIDER_SITE_OTHER): Payer: Medicare Other | Admitting: Family Medicine

## 2012-08-28 DIAGNOSIS — Z23 Encounter for immunization: Secondary | ICD-10-CM

## 2012-08-29 ENCOUNTER — Ambulatory Visit: Payer: Medicare Other | Admitting: Family Medicine

## 2012-08-31 ENCOUNTER — Other Ambulatory Visit: Payer: Self-pay | Admitting: Family Medicine

## 2012-09-07 ENCOUNTER — Encounter: Payer: Medicare Other | Admitting: Family

## 2012-09-12 ENCOUNTER — Ambulatory Visit (INDEPENDENT_AMBULATORY_CARE_PROVIDER_SITE_OTHER): Payer: Medicare Other | Admitting: Family

## 2012-09-12 DIAGNOSIS — I82409 Acute embolism and thrombosis of unspecified deep veins of unspecified lower extremity: Secondary | ICD-10-CM

## 2012-09-12 LAB — POCT INR: INR: 4.4

## 2012-09-12 NOTE — Patient Instructions (Addendum)
Hold Coumadin x 2 days. Eat plenty of greens. Then continue 5 mg everyday and check in 2 weeks.    Latest dosing instructions   Total Sun Mon Tue Wed Thu Fri Sat   35 5 mg 5 mg 5 mg 5 mg 5 mg 5 mg 5 mg    (5 mg1) (5 mg1) (5 mg1) (5 mg1) (5 mg1) (5 mg1) (5 mg1)

## 2012-09-13 ENCOUNTER — Telehealth: Payer: Self-pay | Admitting: Family Medicine

## 2012-09-13 MED ORDER — VALSARTAN-HYDROCHLOROTHIAZIDE 320-25 MG PO TABS: 1.0000 | ORAL_TABLET | Freq: Every day | ORAL | Status: DC

## 2012-09-13 NOTE — Telephone Encounter (Signed)
Patient called stating that she has been trying to get a refill of her Diovan but the pharmacy keeps stating they can not get an answer here and she has been without them for a week. Please assist.

## 2012-09-13 NOTE — Telephone Encounter (Signed)
Pharmacy is [facility] on [address]

## 2012-09-13 NOTE — Telephone Encounter (Signed)
I sent script to pharmacy and spoke with pt

## 2012-09-14 ENCOUNTER — Other Ambulatory Visit: Payer: Self-pay | Admitting: Family Medicine

## 2012-09-25 ENCOUNTER — Ambulatory Visit (INDEPENDENT_AMBULATORY_CARE_PROVIDER_SITE_OTHER): Payer: Medicare Other | Admitting: General Surgery

## 2012-09-26 ENCOUNTER — Ambulatory Visit (INDEPENDENT_AMBULATORY_CARE_PROVIDER_SITE_OTHER): Payer: Medicare Other | Admitting: Family

## 2012-09-26 DIAGNOSIS — I82409 Acute embolism and thrombosis of unspecified deep veins of unspecified lower extremity: Secondary | ICD-10-CM

## 2012-09-26 LAB — POCT INR: INR: 2.1

## 2012-10-30 ENCOUNTER — Telehealth: Payer: Self-pay | Admitting: Family

## 2012-10-30 ENCOUNTER — Ambulatory Visit (INDEPENDENT_AMBULATORY_CARE_PROVIDER_SITE_OTHER): Payer: Self-pay | Admitting: General Surgery

## 2012-10-30 NOTE — Telephone Encounter (Signed)
Please call and schedule INR appointment 

## 2012-10-30 NOTE — Telephone Encounter (Signed)
Left message for pt to call office to schedule pt/inr appt

## 2012-11-08 ENCOUNTER — Telehealth: Payer: Self-pay | Admitting: Family

## 2012-11-08 NOTE — Telephone Encounter (Signed)
Spoke with pt's daughter Misty Stanley who states that pt is unavailable. Advised that pt needs to have pt/inr done asap. Misty Stanley said she will let pt know to call office to schedule appt

## 2012-11-08 NOTE — Telephone Encounter (Signed)
Patient needs an INR visit asap.

## 2012-11-13 ENCOUNTER — Ambulatory Visit (INDEPENDENT_AMBULATORY_CARE_PROVIDER_SITE_OTHER): Payer: Medicare Other | Admitting: Family

## 2012-11-13 DIAGNOSIS — I82409 Acute embolism and thrombosis of unspecified deep veins of unspecified lower extremity: Secondary | ICD-10-CM

## 2012-11-13 DIAGNOSIS — Z7901 Long term (current) use of anticoagulants: Secondary | ICD-10-CM

## 2012-11-13 LAB — POCT INR: INR: 1.9

## 2012-11-13 NOTE — Patient Instructions (Signed)
Take an extra 1/2 tab today only.  continue 5 mg everyday. Recheck in 4 weeks    Latest dosing instructions   Total Sun Mon Tue Wed Thu Fri Sat   35 5 mg 5 mg 5 mg 5 mg 5 mg 5 mg 5 mg    (5 mg1) (5 mg1) (5 mg1) (5 mg1) (5 mg1) (5 mg1) (5 mg1)

## 2012-11-16 ENCOUNTER — Other Ambulatory Visit: Payer: Self-pay | Admitting: Family Medicine

## 2012-11-20 ENCOUNTER — Telehealth: Payer: Self-pay | Admitting: Family Medicine

## 2012-11-20 MED ORDER — LEVOTHYROXINE SODIUM 100 MCG PO TABS: 100.0000 ug | ORAL_TABLET | Freq: Every day | ORAL | Status: DC

## 2012-11-20 NOTE — Telephone Encounter (Signed)
Patient's daughter called stating that she need a refill of her levothyroxine 1poqd called into [facility] and appt has been made. Please assist.

## 2012-12-04 ENCOUNTER — Encounter: Payer: Self-pay | Admitting: Family Medicine

## 2012-12-04 ENCOUNTER — Ambulatory Visit (INDEPENDENT_AMBULATORY_CARE_PROVIDER_SITE_OTHER): Payer: Medicare Other | Admitting: Family Medicine

## 2012-12-04 VITALS — BP 138/80 | HR 78 | Temp 98.3°F | Wt 202.0 lb

## 2012-12-04 DIAGNOSIS — I10 Essential (primary) hypertension: Secondary | ICD-10-CM

## 2012-12-04 DIAGNOSIS — R609 Edema, unspecified: Secondary | ICD-10-CM

## 2012-12-04 DIAGNOSIS — D649 Anemia, unspecified: Secondary | ICD-10-CM

## 2012-12-04 DIAGNOSIS — E785 Hyperlipidemia, unspecified: Secondary | ICD-10-CM

## 2012-12-04 LAB — CBC WITH DIFFERENTIAL/PLATELET
Basophils Relative: 0.7 % (ref 0.0–3.0)
Eosinophils Relative: 2.4 % (ref 0.0–5.0)
HCT: 38.2 % (ref 36.0–46.0)
Hemoglobin: 12.8 g/dL (ref 12.0–15.0)
Lymphs Abs: 2.2 10*3/uL (ref 0.7–4.0)
MCV: 86.8 fl (ref 78.0–100.0)
Monocytes Absolute: 0.6 10*3/uL (ref 0.1–1.0)
Monocytes Relative: 9.1 % (ref 3.0–12.0)
Neutro Abs: 3.2 10*3/uL (ref 1.4–7.7)
Platelets: 252 10*3/uL (ref 150.0–400.0)
RBC: 4.4 Mil/uL (ref 3.87–5.11)
WBC: 6.1 10*3/uL (ref 4.5–10.5)

## 2012-12-04 LAB — BASIC METABOLIC PANEL
BUN: 40 mg/dL — ABNORMAL HIGH (ref 6–23)
Chloride: 99 mEq/L (ref 96–112)
Creatinine, Ser: 1.3 mg/dL — ABNORMAL HIGH (ref 0.4–1.2)
GFR: 42.86 mL/min — ABNORMAL LOW (ref >60.00)
Potassium: 3.6 mEq/L (ref 3.5–5.1)

## 2012-12-04 LAB — HEPATIC FUNCTION PANEL
ALT: 19 U/L (ref 0–35)
Bilirubin, Direct: 0.1 mg/dL (ref 0.0–0.3)
Total Bilirubin: 0.9 mg/dL (ref 0.3–1.2)

## 2012-12-04 LAB — TSH: TSH: 6.96 u[IU]/mL — ABNORMAL HIGH (ref 0.35–5.50)

## 2012-12-04 NOTE — Addendum Note (Signed)
Addended by: Aniceto Boss A on: 12/04/2012 02:58 PM   Modules accepted: Orders

## 2012-12-04 NOTE — Progress Notes (Signed)
  Subjective:    Patient ID: Alexa [name], female    DOB: 10/02/1936, 77 y.o.   MRN: [id number]  HPI Here for follow up. She is doing well and has no complaints. She has been on Aricept for about 8 months, and she feels this is helpful. She has had some memory issues but is still quite functional. She still drives her own car.    Review of Systems  Constitutional: Negative.   Respiratory: Negative.   Cardiovascular: Negative.   Neurological: Negative.        Objective:   Physical Exam  Constitutional: She is oriented to person, place, and time. She appears well-developed and well-nourished.  Neck: No thyromegaly present.  Cardiovascular: Normal rate, regular rhythm, normal heart sounds and intact distal pulses.   Pulmonary/Chest: Effort normal and breath sounds normal.  Lymphadenopathy:    She has no cervical adenopathy.  Neurological: She is alert and oriented to person, place, and time.          Assessment & Plan:  Get labs today.

## 2012-12-07 MED ORDER — LEVOTHYROXINE SODIUM 137 MCG PO TABS: 137.0000 ug | ORAL_TABLET | Freq: Every day | ORAL | Status: DC

## 2012-12-07 NOTE — Progress Notes (Signed)
Quick Note:  I spoke with pt and sent script e-scribe. ______ 

## 2012-12-07 NOTE — Addendum Note (Signed)
Addended by: Aniceto Boss A on: 12/07/2012 12:00 PM   Modules accepted: Orders

## 2012-12-11 ENCOUNTER — Ambulatory Visit (INDEPENDENT_AMBULATORY_CARE_PROVIDER_SITE_OTHER): Payer: Medicare Other | Admitting: Family

## 2012-12-11 ENCOUNTER — Other Ambulatory Visit: Payer: Self-pay | Admitting: Family

## 2012-12-11 ENCOUNTER — Other Ambulatory Visit (INDEPENDENT_AMBULATORY_CARE_PROVIDER_SITE_OTHER): Payer: Medicare Other

## 2012-12-11 DIAGNOSIS — I82409 Acute embolism and thrombosis of unspecified deep veins of unspecified lower extremity: Secondary | ICD-10-CM

## 2012-12-11 DIAGNOSIS — E785 Hyperlipidemia, unspecified: Secondary | ICD-10-CM

## 2012-12-11 LAB — LIPID PANEL
Cholesterol: 211 mg/dL — ABNORMAL HIGH (ref 0–200)
Total CHOL/HDL Ratio: 5
VLDL: 21.4 mg/dL (ref 0.0–40.0)

## 2012-12-11 LAB — POCT INR: INR: 3.5

## 2012-12-11 LAB — LDL CHOLESTEROL, DIRECT: Direct LDL: 143.6 mg/dL

## 2012-12-11 NOTE — Patient Instructions (Signed)
Hold Coumadin today only. Eat a few more servings of greens. Continue 5 mg everyday. Recheck in 4 weeks    Latest dosing instructions   Total Sun Mon Tue Wed Thu Fri Sat   35 5 mg 5 mg 5 mg 5 mg 5 mg 5 mg 5 mg    (5 mg1) (5 mg1) (5 mg1) (5 mg1) (5 mg1) (5 mg1) (5 mg1)

## 2012-12-13 ENCOUNTER — Other Ambulatory Visit: Payer: Self-pay | Admitting: Family

## 2012-12-13 MED ORDER — ROSUVASTATIN CALCIUM 10 MG PO TABS: 10.0000 mg | ORAL_TABLET | Freq: Every day | ORAL | Status: DC

## 2012-12-13 NOTE — Progress Notes (Signed)
Quick Note:  I spoke with pt and sent script e-scribe. ______ 

## 2013-01-01 ENCOUNTER — Other Ambulatory Visit: Payer: Self-pay | Admitting: Family

## 2013-01-05 ENCOUNTER — Other Ambulatory Visit: Payer: Self-pay | Admitting: Family Medicine

## 2013-01-05 MED ORDER — FUROSEMIDE 40 MG PO TABS: 40.0000 mg | ORAL_TABLET | Freq: Two times a day (BID) | ORAL | Status: DC

## 2013-01-05 NOTE — Telephone Encounter (Signed)
Pt notified Rx for Furosemide was sent to pharmacy. 

## 2013-01-05 NOTE — Telephone Encounter (Signed)
Pt states she needs refill of furosemide (LASIX) 40 MG tablet.  Pt is out of this med.  Pt needs new script for this, because Dr Clent Ridges has never refilled for her before. Pls call in to [facility] [address]

## 2013-01-05 NOTE — Telephone Encounter (Signed)
Call in 40 mg to take bid, #60 with 5 rf

## 2013-01-05 NOTE — Telephone Encounter (Signed)
Can we refill this and how many times per day?

## 2013-01-09 ENCOUNTER — Ambulatory Visit (INDEPENDENT_AMBULATORY_CARE_PROVIDER_SITE_OTHER): Payer: Medicare Other | Admitting: Family

## 2013-01-09 DIAGNOSIS — Z7901 Long term (current) use of anticoagulants: Secondary | ICD-10-CM

## 2013-01-09 DIAGNOSIS — I82409 Acute embolism and thrombosis of unspecified deep veins of unspecified lower extremity: Secondary | ICD-10-CM

## 2013-01-09 LAB — POCT INR: INR: 3.7

## 2013-01-24 ENCOUNTER — Telehealth: Payer: Self-pay | Admitting: Oncology

## 2013-01-24 NOTE — Telephone Encounter (Signed)
spoke w/pt gave appt d/t...td °

## 2013-02-15 ENCOUNTER — Emergency Department (HOSPITAL_COMMUNITY): Payer: Medicare Other

## 2013-02-15 ENCOUNTER — Emergency Department (HOSPITAL_COMMUNITY)
Admission: EM | Admit: 2013-02-15 | Discharge: 2013-02-16 | Disposition: A | Discharge status: Home / Self Care | Payer: Medicare Other | Attending: Emergency Medicine | Admitting: Emergency Medicine

## 2013-02-15 DIAGNOSIS — Z8719 Personal history of other diseases of the digestive system: Secondary | ICD-10-CM | POA: Insufficient documentation

## 2013-02-15 DIAGNOSIS — I10 Essential (primary) hypertension: Secondary | ICD-10-CM | POA: Insufficient documentation

## 2013-02-15 DIAGNOSIS — Z79899 Other long term (current) drug therapy: Secondary | ICD-10-CM | POA: Insufficient documentation

## 2013-02-15 DIAGNOSIS — Z9889 Other specified postprocedural states: Secondary | ICD-10-CM | POA: Insufficient documentation

## 2013-02-15 DIAGNOSIS — S39012A Strain of muscle, fascia and tendon of lower back, initial encounter: Secondary | ICD-10-CM

## 2013-02-15 DIAGNOSIS — K219 Gastro-esophageal reflux disease without esophagitis: Secondary | ICD-10-CM | POA: Insufficient documentation

## 2013-02-15 DIAGNOSIS — S139XXA Sprain of joints and ligaments of unspecified parts of neck, initial encounter: Secondary | ICD-10-CM | POA: Insufficient documentation

## 2013-02-15 DIAGNOSIS — IMO0002 Reserved for concepts with insufficient information to code with codable children: Secondary | ICD-10-CM | POA: Insufficient documentation

## 2013-02-15 DIAGNOSIS — Z860100 Personal history of colon polyps, unspecified: Secondary | ICD-10-CM | POA: Insufficient documentation

## 2013-02-15 DIAGNOSIS — Z853 Personal history of malignant neoplasm of breast: Secondary | ICD-10-CM | POA: Insufficient documentation

## 2013-02-15 DIAGNOSIS — Z86718 Personal history of other venous thrombosis and embolism: Secondary | ICD-10-CM | POA: Insufficient documentation

## 2013-02-15 DIAGNOSIS — S161XXA Strain of muscle, fascia and tendon at neck level, initial encounter: Secondary | ICD-10-CM

## 2013-02-15 DIAGNOSIS — E785 Hyperlipidemia, unspecified: Secondary | ICD-10-CM | POA: Insufficient documentation

## 2013-02-15 DIAGNOSIS — F0390 Unspecified dementia without behavioral disturbance: Secondary | ICD-10-CM | POA: Insufficient documentation

## 2013-02-15 DIAGNOSIS — Y9389 Activity, other specified: Secondary | ICD-10-CM | POA: Insufficient documentation

## 2013-02-15 DIAGNOSIS — M129 Arthropathy, unspecified: Secondary | ICD-10-CM | POA: Insufficient documentation

## 2013-02-15 DIAGNOSIS — Y92410 Unspecified street and highway as the place of occurrence of the external cause: Secondary | ICD-10-CM | POA: Insufficient documentation

## 2013-02-15 MED ORDER — MORPHINE SULFATE 4 MG/ML IJ SOLN
4.0000 mg | Freq: Once | INTRAMUSCULAR | Status: AC
  Administered 2013-02-15: 4 mg via INTRAVENOUS
  Filled 2013-02-15: qty 1
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
## 2013-02-16 MED ORDER — METHOCARBAMOL 500 MG PO TABS: 500.0000 mg | ORAL_TABLET | Freq: Two times a day (BID) | ORAL | Status: DC

## 2013-02-19 NOTE — ED Provider Notes (Signed)
Medical screening examination/treatment/procedure(s) were performed by non-physician practitioner and as supervising physician I was immediately available for consultation/collaboration.  Sharmon Cheramie, MD 02/19/13 0846 

## 2013-03-13 ENCOUNTER — Other Ambulatory Visit (HOSPITAL_BASED_OUTPATIENT_CLINIC_OR_DEPARTMENT_OTHER): Payer: Medicare Other | Admitting: Lab

## 2013-03-13 DIAGNOSIS — C50919 Malignant neoplasm of unspecified site of unspecified female breast: Secondary | ICD-10-CM

## 2013-03-13 DIAGNOSIS — C50519 Malignant neoplasm of lower-outer quadrant of unspecified female breast: Secondary | ICD-10-CM

## 2013-03-13 LAB — CBC WITH DIFFERENTIAL/PLATELET
BASO%: 0.8 % (ref 0.0–2.0)
EOS%: 1.3 % (ref 0.0–7.0)
HCT: 38.3 % (ref 34.8–46.6)
MCH: 30 pg (ref 25.1–34.0)
MCHC: 34.6 g/dL (ref 31.5–36.0)
MCV: 86.6 fL (ref 79.5–101.0)
MONO%: 7.1 % (ref 0.0–14.0)
NEUT%: 69.8 % (ref 38.4–76.8)
RDW: 14.1 % (ref 11.2–14.5)
lymph#: 1.6 10*3/uL (ref 0.9–3.3)

## 2013-03-27 ENCOUNTER — Ambulatory Visit: Payer: Medicare Other | Admitting: Oncology

## 2013-04-03 ENCOUNTER — Ambulatory Visit (HOSPITAL_BASED_OUTPATIENT_CLINIC_OR_DEPARTMENT_OTHER): Payer: Medicare Other | Admitting: Oncology

## 2013-04-03 ENCOUNTER — Telehealth: Payer: Self-pay | Admitting: Oncology

## 2013-04-03 VITALS — BP 148/66 | HR 73 | Temp 97.0°F | Resp 19 | Ht 63.0 in | Wt 195.9 lb

## 2013-04-03 DIAGNOSIS — R918 Other nonspecific abnormal finding of lung field: Secondary | ICD-10-CM

## 2013-04-03 DIAGNOSIS — Z853 Personal history of malignant neoplasm of breast: Secondary | ICD-10-CM

## 2013-04-03 DIAGNOSIS — R222 Localized swelling, mass and lump, trunk: Secondary | ICD-10-CM

## 2013-04-03 NOTE — Telephone Encounter (Signed)
GV AND PRINTED APPT SCHED AND AVS FOR mAY 2015

## 2013-04-05 ENCOUNTER — Telehealth: Payer: Self-pay | Admitting: *Deleted

## 2013-04-05 ENCOUNTER — Ambulatory Visit (HOSPITAL_COMMUNITY)
Admission: RE | Admit: 2013-04-05 | Discharge: 2013-04-05 | Disposition: A | Discharge status: Home / Self Care | Payer: Medicare Other | Source: Ambulatory Visit | Attending: Oncology | Admitting: Oncology

## 2013-04-05 ENCOUNTER — Other Ambulatory Visit: Payer: Self-pay | Admitting: Oncology

## 2013-04-05 ENCOUNTER — Encounter (HOSPITAL_COMMUNITY): Payer: Self-pay

## 2013-04-05 DIAGNOSIS — Z981 Arthrodesis status: Secondary | ICD-10-CM | POA: Insufficient documentation

## 2013-04-05 DIAGNOSIS — Z853 Personal history of malignant neoplasm of breast: Secondary | ICD-10-CM

## 2013-04-05 DIAGNOSIS — C50912 Malignant neoplasm of unspecified site of left female breast: Secondary | ICD-10-CM

## 2013-04-05 DIAGNOSIS — Z9089 Acquired absence of other organs: Secondary | ICD-10-CM | POA: Insufficient documentation

## 2013-04-05 DIAGNOSIS — R918 Other nonspecific abnormal finding of lung field: Secondary | ICD-10-CM

## 2013-04-05 DIAGNOSIS — I700 Atherosclerosis of aorta: Secondary | ICD-10-CM | POA: Insufficient documentation

## 2013-04-05 DIAGNOSIS — J8410 Pulmonary fibrosis, unspecified: Secondary | ICD-10-CM | POA: Insufficient documentation

## 2013-04-05 DIAGNOSIS — R911 Solitary pulmonary nodule: Secondary | ICD-10-CM | POA: Insufficient documentation

## 2013-04-05 DIAGNOSIS — Z9010 Acquired absence of unspecified breast and nipple: Secondary | ICD-10-CM | POA: Insufficient documentation

## 2013-04-05 DIAGNOSIS — R222 Localized swelling, mass and lump, trunk: Secondary | ICD-10-CM | POA: Insufficient documentation

## 2013-04-05 DIAGNOSIS — Z923 Personal history of irradiation: Secondary | ICD-10-CM | POA: Insufficient documentation

## 2013-04-05 NOTE — Progress Notes (Signed)
Hematology and Oncology Follow Up Visit  Alexa [name] [id number] 09-07-1936 77 y.o. 04/05/2013 5:29 PM   Principle Diagnosis: Encounter Diagnoses  Name Primary?  . ADENOCARCINOMA, BREAST, BILATERAL, HX OF Yes  . Right Lung Mass      [place] History: Followup visit for this 77 year old woman with history of stage  1 ER positive cancer of the left breast diagnosed in July 2002. She underwent a left modified radical mastectomy with lymph node dissection and an elective right simple mastectomy. Tumor in the left breast was 1.7 cm. Invasive ductal cell histology. No vascular or lymphatic invasion. Grade 3. No cancer detected in 14 lymph nodes. Pathologic stage TI, N0, M0, estrogen receptor positive, HER-2 negative. Following surgery she received hormonal therapy with 5 years of Arimidex.  She is also followed for a history of an abnormal CT chest with an atypical infiltrate over a 2 x 2 cm area in a former smoker.  She has been getting periodic CT scans since this abnormality was first noted on a scan done 02/16/2008.  Biopsies done 09/03/10 were all negative.  Subsequent CTs have been stable through 04/06/2012. I did a studies subsequent to today's visit on May 8 and results are already back. This shows progressive growth of the right upper lobe lesion now measuring 3.3 x 2.6 cm.  She is clinically asymptomatic. No anorexia. No weight loss. No cough. No dyspnea.  Medications: reviewed  Allergies:  Allergies  Allergen Reactions  . Hydrocodone-Homatropine     REACTION: Nausea  . Nsaids   . Penicillins     REACTION: rash  . Simvastatin     REACTION: muscle pain  . Strawberry     Review of Systems: Constitutional:   See above Respiratory: See above Cardiovascular:  No chest pain or palpitations Gastrointestinal: No change in bowel habit Genito-Urinary:  Musculoskeletal: No change in chronic arthritis pain Neurologic: No headache or change in vision Skin: No rash Remaining ROS  negative.  Physical Exam: Blood pressure 148/66, pulse 73, temperature 97 F (36.1 C), temperature source Oral, resp. rate 19, height 5\' 3"  (1.6 m), weight 195 lb 14.4 oz (88.86 kg). Wt Readings from Last 3 Encounters:  04/03/13 195 lb 14.4 oz (88.86 kg)  12/04/12 202 lb (91.627 kg)  04/06/12 204 lb (92.534 kg)     General appearance: Well-nourished Caucasian woman HENNT: Pharynx no erythema or exudate Lymph nodes: No adenopathy Breasts: Bilateral mastectomies. No chest wall lesions Lungs: Clear to auscultation resonant to percussion Heart: Regular rhythm Abdomen: Soft, nontender, no mass, no organomegaly Extremities: No edema, no calf tenderness Musculoskeletal: GU: Vascular: No cyanosis Neurologic: No focal deficit Skin: No rash  Lab Results: Lab Results  Component Value Date   WBC 7.6 03/13/2013   HGB 13.3 03/13/2013   HCT 38.3 03/13/2013   MCV 86.6 03/13/2013   PLT 247 03/13/2013     Chemistry      Component Value Date/Time   NA 139 12/04/2012 0905   K 3.6 12/04/2012 0905   CL 99 12/04/2012 0905   CO2 30 12/04/2012 0905   BUN 40* 12/04/2012 0905   CREATININE 1.3* 12/04/2012 0905      Component Value Date/Time   CALCIUM 9.1 12/04/2012 0905   ALKPHOS 69 12/04/2012 0905   AST 27 12/04/2012 0905   ALT 19 12/04/2012 0905   BILITOT 0.9 12/04/2012 0905       Radiological Studies: Ct Chest Wo Contrast  04/05/2013  *RADIOLOGY REPORT*  Clinical Data: Follow-up right pulmonary nodule.  History of left- sided breast cancer 2002 status post bilateral mastectomies.  CT CHEST WITHOUT CONTRAST  Technique:  Multidetector CT imaging of the chest was performed following the standard protocol without IV contrast.  Comparison: 04/06/2012, 06/10/2011, 12/10/2010 similar exams  Findings: Cervical fusion hardware partly visualized.  9 mm pretracheal lymph node is stable.  Mild atheromatous aortic calcification without aneurysm.  Heart size is normal.  No pericardial or pleural effusion.  No new  lymphadenopathy.  Using a similar measurement technique, there has been interval increase in irregular mass-like opacity with internal air bronchogram formation in the posterior right upper lobe, now 3.3 x 2.6 cm on image 22.  There is also minimal nodularity of peripheral spiculations emanating from this mass at this anatomic level. Central airways are patent.  Calcified left lower lobe 2 mm granuloma again noted image 39.  Minimal lingular scarring is stable.  No new area of consolidation, nodule, or mass.  Right humeral bone anchors noted.  There is mild streak artifact from thoracolumbar fusion hardware.  Left axillary clips and evidence of bilateral mastectomy is present.  No new lytic or sclerotic osseous lesion.  Incomplete imaging of the upper abdomen demonstrates normal- appearing adrenal glands. Cholecystectomy clips noted.  IMPRESSION: Increase in size of right upper lobe mass-like consolidation, highly suspicious for adenocarcinoma.  Consider surgical consultation.  No CT evidence for intrathoracic metastatic breast cancer.  These results will be called to the ordering clinician or representative by the Radiologist Assistant, and communication documented in the PACS Dashboard.   Original Report Authenticated By: Christiana Pellant, M.D.     Impression: #1. Stage I, ER positive, left breast cancer She remains free of any recurrence now out 12 years from diagnosis.  #2. Slowly enlarging right upper lobe lung mass in a former smoker. 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 Plan: I will get a PET scan. Patient has been followed closely by cardiovascular surgery Dr. Tyrone Sage and I will have him review the films.  #3. Essential hypertension  #4. Hypothyroid on replacement  #5. Right lower extremity DVT November 2012  #6. Venous stasis ulcer right foot/medial malleolus area now healed     CC:. Dr. Gershon Crane; Dr. Sheliah Plane; Dr. Marca Ancona   Levert Feinstein, MD 5/8/20145:29 PM

## 2013-04-05 NOTE — Telephone Encounter (Signed)
THIS REPORT WAS FAXED AND GIVEN TO DR.GRANFORTUNA'S NURSE, MYRTLE. SHE WILL SHOW REPORT TO LISA THOMAS,NP SINCE DR.GRANFORTUNA IS OUT OF THE OFFICE TODAY.

## 2013-04-06 ENCOUNTER — Telehealth: Payer: Self-pay | Admitting: Oncology

## 2013-04-06 ENCOUNTER — Other Ambulatory Visit: Payer: Self-pay | Admitting: *Deleted

## 2013-04-06 DIAGNOSIS — R911 Solitary pulmonary nodule: Secondary | ICD-10-CM

## 2013-04-06 NOTE — Telephone Encounter (Signed)
, °

## 2013-04-09 ENCOUNTER — Other Ambulatory Visit: Payer: Self-pay | Admitting: Oncology

## 2013-04-09 ENCOUNTER — Encounter (HOSPITAL_COMMUNITY)
Admission: RE | Admit: 2013-04-09 | Discharge: 2013-04-09 | Disposition: A | Discharge status: Home / Self Care | Payer: Medicare Other | Source: Ambulatory Visit | Attending: Oncology | Admitting: Oncology

## 2013-04-09 DIAGNOSIS — R599 Enlarged lymph nodes, unspecified: Secondary | ICD-10-CM | POA: Insufficient documentation

## 2013-04-09 DIAGNOSIS — C3490 Malignant neoplasm of unspecified part of unspecified bronchus or lung: Secondary | ICD-10-CM | POA: Insufficient documentation

## 2013-04-09 DIAGNOSIS — R918 Other nonspecific abnormal finding of lung field: Secondary | ICD-10-CM

## 2013-04-09 DIAGNOSIS — C50912 Malignant neoplasm of unspecified site of left female breast: Secondary | ICD-10-CM

## 2013-04-09 DIAGNOSIS — I517 Cardiomegaly: Secondary | ICD-10-CM | POA: Insufficient documentation

## 2013-04-09 DIAGNOSIS — J3489 Other specified disorders of nose and nasal sinuses: Secondary | ICD-10-CM | POA: Insufficient documentation

## 2013-04-09 DIAGNOSIS — Z853 Personal history of malignant neoplasm of breast: Secondary | ICD-10-CM | POA: Insufficient documentation

## 2013-04-09 DIAGNOSIS — Z9089 Acquired absence of other organs: Secondary | ICD-10-CM | POA: Insufficient documentation

## 2013-04-09 DIAGNOSIS — Z90710 Acquired absence of both cervix and uterus: Secondary | ICD-10-CM | POA: Insufficient documentation

## 2013-04-09 MED ORDER — FLUDEOXYGLUCOSE F - 18 (FDG) INJECTION
18.3000 | Freq: Once | INTRAVENOUS | Status: AC | PRN
  Administered 2013-04-09: 18.3 via INTRAVENOUS

## 2013-04-10 ENCOUNTER — Telehealth: Payer: Self-pay | Admitting: *Deleted

## 2013-04-10 NOTE — Telephone Encounter (Signed)
Spoke with dtr. Misty Stanley.   Let her know that only area of uptake on PET scan is the lung mass that we already know about from the CT.  Next step is consult with Dr. Tyrone Sage, which is scheduled for 5/29.  Explained to dtr. Difference between PET and CT and that this is good news.

## 2013-04-10 NOTE — Telephone Encounter (Signed)
Message copied by Orbie Hurst on Tue Apr 10, 2013  2:44 PM ------      Message from: Levert Feinstein      Created: Tue Apr 10, 2013 10:37 AM       Call pt - only area of uptake on PET is the lung mass that we know about from the CT.  Next step is consultation with Dr Tyrone Sage ------

## 2013-04-26 ENCOUNTER — Other Ambulatory Visit: Payer: Self-pay | Admitting: *Deleted

## 2013-04-26 ENCOUNTER — Ambulatory Visit (INDEPENDENT_AMBULATORY_CARE_PROVIDER_SITE_OTHER): Payer: Medicare Other | Admitting: Cardiothoracic Surgery

## 2013-04-26 ENCOUNTER — Encounter: Payer: Self-pay | Admitting: Cardiothoracic Surgery

## 2013-04-26 VITALS — BP 126/70 | HR 85 | Resp 20 | Ht 63.0 in | Wt 195.0 lb

## 2013-04-26 DIAGNOSIS — R222 Localized swelling, mass and lump, trunk: Secondary | ICD-10-CM

## 2013-04-26 NOTE — Progress Notes (Signed)
[address]       [name] [address]             [phone]                      Inez Pilgrim Date of Birth: 07-Jun-1936  Nelwyn Salisbury, MD Nelwyn Salisbury, MD  Chief Complaint:  Chief Complaint  Patient presents with  . Follow-up    1 year f/u with Chest CT    History of Present Illness:  Patient returns today for followup visit concerning possible right lung lesion following  her since March of 2011 when a CT scan done March 1 raised the issue of a right lung lesion in looking back at old films this infiltrative process has been present since 2009. ENB biopsy by Dr Edwyna Shell was negative on 09/07/2010 but because of the question of possible slow-growing bronchial alveolar carcinoma presenting in this fashion it continued to follow the patient carefully. Since last seen she has had difficulty with nonhealing ulcer in the right leg. She notes her respiratory status has been stable without increasing shortness of breath.   Past Medical History  Diagnosis Date  . Hyperlipidemia   . Hypertension   . Cancer     breast  . GERD (gastroesophageal reflux disease)   . Diverticulosis   . Hx of adenomatous colonic polyps   . DVT (deep venous thrombosis) 09-30-11    right lower leg   . Arthritis   . Dementia     sees Dr. Vickey Huger    Past Surgical History  Procedure Laterality Date  . Cholecystectomy  2007  . Breast surgery  2002    BL mastectomy  . Lumbar laminectomy    . Rotator cuff repair    . Cervical spine surgery    . Hip surgery    . Spine surgery    . Abdominal hysterectomy    . Fiberoptic bronchoscopy with electromagnetic  10/07/2010    History  Smoking status  . Former Smoker  Smokeless tobacco  . Never Used    History  Alcohol Use No    History   Social History  . Marital Status: Widowed    Spouse Name: N/A    Number of Children: N/A  . Years of Education: N/A   Occupational History  . Not on file.   Social History  Main Topics  . Smoking status: Former Games developer  . Smokeless tobacco: Never Used  . Alcohol Use: No  . Drug Use: No  . Sexually Active: No   Other Topics Concern  . Not on file   Social History Narrative  . No narrative on file    Allergies  Allergen Reactions  . Hydrocodone-Homatropine     REACTION: Nausea  . Nsaids   . Penicillins     REACTION: rash  . Simvastatin     REACTION: muscle pain  . Strawberry     Current Outpatient Prescriptions  Medication Sig Dispense Refill  . donepezil (ARICEPT) 10 MG tablet Take 10 mg by mouth at bedtime as needed (memory).       . furosemide (LASIX) 40 MG tablet Take 1 tablet (40 mg total) by mouth 2 (two) times daily.  60 tablet  5  . levothyroxine (SYNTHROID) 137 MCG tablet Take 1 tablet (137 mcg total) by mouth daily.  30 tablet  11  . methocarbamol (ROBAXIN) 500 MG tablet Take 1 tablet (500 mg total)  by mouth 2 (two) times daily.  20 tablet  0  . morphine (MSIR) 30 MG tablet Take 30 mg by mouth 3 (three) times daily.       Marland Kitchen oxyCODONE-acetaminophen (PERCOCET) 10-325 MG per tablet Take 1 tablet by mouth every 4 (four) hours as needed (pain).       . potassium chloride SA (K-DUR,KLOR-CON) 20 MEQ tablet Take 20 mEq by mouth daily as needed (hypokalemia).      . rosuvastatin (CRESTOR) 10 MG tablet Take 10 mg by mouth every other day.      . TRAVATAN Z 0.004 % SOLN ophthalmic solution Place 1 drop into both eyes Nightly.      . valsartan-hydrochlorothiazide (DIOVAN-HCT) 320-25 MG per tablet Take 1 tablet by mouth daily.  30 tablet  9   No current facility-administered medications for this visit.     Family History  Problem Relation Age of Onset  . Diabetes Daughter   . Diabetes Daughter   . Heart disease Father   . Heart disease Brother   . Heart disease Sister   . Colon cancer Neg Hx   . Colon polyps Daughter        Physical Exam: BP 126/70  Pulse 85  Resp 20  Ht 5\' 3"  (1.6 m)  Wt 195 lb (88.451 kg)  BMI 34.55 kg/m2   SpO2 98%  Overall the patient appears in no distress she is not short of breath at rest she has no jugular venous distention I do not appreciate any cervical or supraclavicular adenopathy or axillary adenopathy Her lungs are clear bilaterally Cardiac exam feels regular rate and rhythm without murmur gallop, she's had previous bilateral mastectomy Lower extremity is have 2+ pedal edema bilaterally the patient  The right leg is healed from chronic uler  from her treatments in the wound care center  Diagnostic Studies & Laboratory data: Ct Chest Wo Contrast  04/05/2013   *RADIOLOGY REPORT*  Clinical Data: Follow-up right pulmonary nodule.  History of left- sided breast cancer 2002 status post bilateral mastectomies.  CT CHEST WITHOUT CONTRAST  Technique:  Multidetector CT imaging of the chest was performed following the standard protocol without IV contrast.  Comparison: 04/06/2012, 06/10/2011, 12/10/2010 similar exams  Findings: Cervical fusion hardware partly visualized.  9 mm pretracheal lymph node is stable.  Mild atheromatous aortic calcification without aneurysm.  Heart size is normal.  No pericardial or pleural effusion.  No new lymphadenopathy.  Using a similar measurement technique, there has been interval increase in irregular mass-like opacity with internal air bronchogram formation in the posterior right upper lobe, now 3.3 x 2.6 cm on image 22.  There is also minimal nodularity of peripheral spiculations emanating from this mass at this anatomic level. Central airways are patent.  Calcified left lower lobe 2 mm granuloma again noted image 39.  Minimal lingular scarring is stable.  No new area of consolidation, nodule, or mass.  Right humeral bone anchors noted.  There is mild streak artifact from thoracolumbar fusion hardware.  Left axillary clips and evidence of bilateral mastectomy is present.  No new lytic or sclerotic osseous lesion.  Incomplete imaging of the upper abdomen demonstrates  normal- appearing adrenal glands. Cholecystectomy clips noted.  IMPRESSION: Increase in size of right upper lobe mass-like consolidation, highly suspicious for adenocarcinoma.  Consider surgical consultation.  No CT evidence for intrathoracic metastatic breast cancer.  These results will be called to the ordering clinician or representative by the Radiologist Assistant, and communication documented in  the PACS Dashboard.   Original Report Authenticated By: Christiana Pellant, M.D.   Nm Pet Image Initial (pi) Skull Base To Thigh  04/09/2013   *RADIOLOGY REPORT*  Clinical Data: Subsequent treatment strategy for enlarging right- sided lung lesion.  NUCLEAR MEDICINE PET SKULL BASE TO THIGH  Fasting Blood Glucose:  111  Technique:  18.3 mCi F-18 FDG was injected intravenously. CT data was obtained and used for attenuation correction and anatomic localization only.  (This was not acquired as a diagnostic CT examination.) Additional exam technical data entered on technologist worksheet.  Comparison:  Chest CT of 04/05/2013.  PET of 01/27/2010.  Findings:  Neck: No abnormal hypermetabolism.  Chest:  Left-sided subclavian vascular hypermetabolism which is likely physiologic.  Posterior right upper lobe pulmonary opacity which measures 3.0 x 1.9 cm and a S.U.V. max of 3.5 on image 84.  Similar in size to the 04/05/2013 CT, but enlarged and newly hypermetabolic since 01/27/2010.  Subcarinal/azygo-esophageal recess node which measures 1.1 cm a S.U.V. max of 4.0 on image 92/series 2.  This activity is similar to the surrounding mediastinal pool and the node is similar in size to 01/27/2010 PET.  Abdomen/Pelvis:  No abnormal hypermetabolism.  Skeleton:  Muscular activity about the left posterior ribs is favored to be physiologic and may relate to breathing motion.  CT  images performed for attenuation correction demonstrate fluid and mucosal thickening in the left maxillary sinus.  Cervical spine fixation.  Mild cardiomegaly.   Chest findings deferred to recent diagnostic CT.  Beam hardening artifact from thoracolumbar spine fixation. Normal adrenal glands.  Cholecystectomy clips.  Underdistended gastric body.  Hysterectomy.  IMPRESSION:  1.  Right upper lobe pulmonary mass which demonstrates low level hypermetabolism and slow interval growth since 2011.  Most consistent with low grade adenocarcinoma. 2.  A mildly hypermetabolic, mildly enlarged azygo-esophageal recess node is similar in size back to 2011.  Although favored to be reactive, technically indeterminate. 3.  Otherwise, no evidence of metastatic disease. 4.  Sinus disease.   Original Report Authenticated By: Jeronimo Greaves, M.D.     Assessment / Plan:  I reviewed the findings with the patient, and compared the CT scans from 2009 until current scan done today.  slow-growing bronchoalveolar carcinoma still exists. With increase in size I have recommended we proceed with resection of the slowing enlarging hypermetabolic lesion. She is considering options , I will obtain PFT's and see her back next week   Delight Ovens MD

## 2013-05-02 ENCOUNTER — Encounter: Payer: Self-pay | Admitting: Cardiothoracic Surgery

## 2013-05-03 ENCOUNTER — Encounter: Payer: Self-pay | Admitting: Cardiothoracic Surgery

## 2013-05-03 ENCOUNTER — Encounter: Payer: Self-pay | Admitting: *Deleted

## 2013-05-03 ENCOUNTER — Ambulatory Visit (INDEPENDENT_AMBULATORY_CARE_PROVIDER_SITE_OTHER): Payer: Medicare Other | Admitting: Cardiothoracic Surgery

## 2013-05-03 ENCOUNTER — Ambulatory Visit (HOSPITAL_COMMUNITY)
Admission: RE | Admit: 2013-05-03 | Discharge: 2013-05-03 | Disposition: A | Discharge status: Home / Self Care | Payer: Medicare Other | Source: Ambulatory Visit | Attending: Cardiothoracic Surgery | Admitting: Cardiothoracic Surgery

## 2013-05-03 VITALS — BP 145/57 | HR 76 | Temp 97.0°F | Resp 22 | Ht 62.0 in | Wt 191.9 lb

## 2013-05-03 DIAGNOSIS — J988 Other specified respiratory disorders: Secondary | ICD-10-CM | POA: Insufficient documentation

## 2013-05-03 DIAGNOSIS — R0989 Other specified symptoms and signs involving the circulatory and respiratory systems: Secondary | ICD-10-CM | POA: Insufficient documentation

## 2013-05-03 DIAGNOSIS — R222 Localized swelling, mass and lump, trunk: Secondary | ICD-10-CM

## 2013-05-03 DIAGNOSIS — R0609 Other forms of dyspnea: Secondary | ICD-10-CM | POA: Insufficient documentation

## 2013-05-03 DIAGNOSIS — R062 Wheezing: Secondary | ICD-10-CM | POA: Insufficient documentation

## 2013-05-03 DIAGNOSIS — R918 Other nonspecific abnormal finding of lung field: Secondary | ICD-10-CM

## 2013-05-03 LAB — PULMONARY FUNCTION TEST

## 2013-05-03 MED ORDER — ALBUTEROL SULFATE (5 MG/ML) 0.5% IN NEBU
2.5000 mg | INHALATION_SOLUTION | Freq: Once | RESPIRATORY_TRACT | Status: AC
  Administered 2013-05-03: 2.5 mg via RESPIRATORY_TRACT

## 2013-05-03 NOTE — Progress Notes (Signed)
[address]       [name] [address]             [phone]                      Alexa [name] Date of Birth: November 22, 1936  Alexa Salisbury, MD Alexa Salisbury, MD  Chief Complaint:  No chief complaint on file.   History of Present Illness:  Patient returns today for followup visit concerning possible right lung lesion following  her since March of 2011 when a CT scan done March 1 raised the issue of a right lung lesion in looking back at old films this infiltrative process has been present since 2009. ENB biopsy by Dr Edwyna Shell was negative on 09/07/2010 but because of the question of possible slow-growing bronchial alveolar carcinoma presenting in this fashion it continued to follow the patient carefully. Since last seen she has had difficulty with nonhealing ulcer in the right leg. She notes her respiratory status has been stable without increasing shortness of breath.   Past Medical History  Diagnosis Date  . Hyperlipidemia   . Hypertension   . Cancer     breast  . GERD (gastroesophageal reflux disease)   . Diverticulosis   . Hx of adenomatous colonic polyps   . DVT (deep venous thrombosis) 09-30-11    right lower leg   . Arthritis   . Dementia     sees Dr. Vickey Huger    Past Surgical History  Procedure Laterality Date  . Cholecystectomy  2007  . Breast surgery  2002    BL mastectomy  . Lumbar laminectomy    . Rotator cuff repair    . Cervical spine surgery    . Hip surgery    . Spine surgery    . Abdominal hysterectomy    . Fiberoptic bronchoscopy with electromagnetic  10/07/2010    History  Smoking status  . Former Smoker  Smokeless tobacco  . Never Used    History  Alcohol Use No    History   Social History  . Marital Status: Widowed    Spouse Name: N/A    Number of Children: N/A  . Years of Education: N/A   Occupational History  . Not on file.   Social History Main Topics  . Smoking status: Former Games developer  .  Smokeless tobacco: Never Used  . Alcohol Use: No  . Drug Use: No  . Sexually Active: No   Other Topics Concern  . Not on file   Social History Narrative  . No narrative on file    Allergies  Allergen Reactions  . Hydrocodone-Homatropine     REACTION: Nausea  . Nsaids   . Penicillins     REACTION: rash  . Simvastatin     REACTION: muscle pain  . Strawberry     Current Outpatient Prescriptions  Medication Sig Dispense Refill  . donepezil (ARICEPT) 10 MG tablet Take 10 mg by mouth at bedtime as needed (memory).       . furosemide (LASIX) 40 MG tablet Take 1 tablet (40 mg total) by mouth 2 (two) times daily.  60 tablet  5  . levothyroxine (SYNTHROID) 137 MCG tablet Take 1 tablet (137 mcg total) by mouth daily.  30 tablet  11  . methocarbamol (ROBAXIN) 500 MG tablet Take 1 tablet (500 mg total) by mouth 2 (two) times daily.  20 tablet  0  . morphine (  MSIR) 30 MG tablet Take 30 mg by mouth 3 (three) times daily.       Marland Kitchen oxyCODONE-acetaminophen (PERCOCET) 10-325 MG per tablet Take 1 tablet by mouth every 4 (four) hours as needed (pain).       . potassium chloride SA (K-DUR,KLOR-CON) 20 MEQ tablet Take 20 mEq by mouth daily as needed (hypokalemia).      . rosuvastatin (CRESTOR) 10 MG tablet Take 10 mg by mouth every other day.      . TRAVATAN Z 0.004 % SOLN ophthalmic solution Place 1 drop into both eyes Nightly.      . valsartan-hydrochlorothiazide (DIOVAN-HCT) 320-25 MG per tablet Take 1 tablet by mouth daily.  30 tablet  9   No current facility-administered medications for this visit.     Family History  Problem Relation Age of Onset  . Diabetes Daughter   . Diabetes Daughter   . Heart disease Father   . Heart disease Brother   . Heart disease Sister   . Colon cancer Neg Hx   . Colon polyps Daughter        Physical Exam: BP 145/57  Pulse 76  Temp(Src) 97 F (36.1 C)  Resp 22  Ht 5\' 2"  (1.575 m)  Wt 191 lb 14.4 oz (87.045 kg)  BMI 35.09 kg/m2  Overall the  patient appears in no distress she is not short of breath at rest she has no jugular venous distention I do not appreciate any cervical or supraclavicular adenopathy or axillary adenopathy Her lungs are clear bilaterally Cardiac exam feels regular rate and rhythm without murmur gallop, she's had previous bilateral mastectomy Lower extremity is have 2+ pedal edema bilaterally the patient  The right leg is healed from chronic uler  from her treatments in the wound care center  Diagnostic Studies & Laboratory data: Ct Chest Wo Contrast  04/05/2013   *RADIOLOGY REPORT*  Clinical Data: Follow-up right pulmonary nodule.  History of left- sided breast cancer 2002 status post bilateral mastectomies.  CT CHEST WITHOUT CONTRAST  Technique:  Multidetector CT imaging of the chest was performed following the standard protocol without IV contrast.  Comparison: 04/06/2012, 06/10/2011, 12/10/2010 similar exams  Findings: Cervical fusion hardware partly visualized.  9 mm pretracheal lymph node is stable.  Mild atheromatous aortic calcification without aneurysm.  Heart size is normal.  No pericardial or pleural effusion.  No new lymphadenopathy.  Using a similar measurement technique, there has been interval increase in irregular mass-like opacity with internal air bronchogram formation in the posterior right upper lobe, now 3.3 x 2.6 cm on image 22.  There is also minimal nodularity of peripheral spiculations emanating from this mass at this anatomic level. Central airways are patent.  Calcified left lower lobe 2 mm granuloma again noted image 39.  Minimal lingular scarring is stable.  No new area of consolidation, nodule, or mass.  Right humeral bone anchors noted.  There is mild streak artifact from thoracolumbar fusion hardware.  Left axillary clips and evidence of bilateral mastectomy is present.  No new lytic or sclerotic osseous lesion.  Incomplete imaging of the upper abdomen demonstrates normal- appearing adrenal  glands. Cholecystectomy clips noted.  IMPRESSION: Increase in size of right upper lobe mass-like consolidation, highly suspicious for adenocarcinoma.  Consider surgical consultation.  No CT evidence for intrathoracic metastatic breast cancer.  These results will be called to the ordering clinician or representative by the Radiologist Assistant, and communication documented in the PACS Dashboard.   Original Report Authenticated By:  Christiana Pellant, M.D.   Nm Pet Image Initial (pi) Skull Base To Thigh  04/09/2013   *RADIOLOGY REPORT*  Clinical Data: Subsequent treatment strategy for enlarging right- sided lung lesion.  NUCLEAR MEDICINE PET SKULL BASE TO THIGH  Fasting Blood Glucose:  111  Technique:  18.3 mCi F-18 FDG was injected intravenously. CT data was obtained and used for attenuation correction and anatomic localization only.  (This was not acquired as a diagnostic CT examination.) Additional exam technical data entered on technologist worksheet.  Comparison:  Chest CT of 04/05/2013.  PET of 01/27/2010.  Findings:  Neck: No abnormal hypermetabolism.  Chest:  Left-sided subclavian vascular hypermetabolism which is likely physiologic.  Posterior right upper lobe pulmonary opacity which measures 3.0 x 1.9 cm and a S.U.V. max of 3.5 on image 84.  Similar in size to the 04/05/2013 CT, but enlarged and newly hypermetabolic since 01/27/2010.  Subcarinal/azygo-esophageal recess node which measures 1.1 cm a S.U.V. max of 4.0 on image 92/series 2.  This activity is similar to the surrounding mediastinal pool and the node is similar in size to 01/27/2010 PET.  Abdomen/Pelvis:  No abnormal hypermetabolism.  Skeleton:  Muscular activity about the left posterior ribs is favored to be physiologic and may relate to breathing motion.  CT  images performed for attenuation correction demonstrate fluid and mucosal thickening in the left maxillary sinus.  Cervical spine fixation.  Mild cardiomegaly.  Chest findings deferred to  recent diagnostic CT.  Beam hardening artifact from thoracolumbar spine fixation. Normal adrenal glands.  Cholecystectomy clips.  Underdistended gastric body.  Hysterectomy.  IMPRESSION:  1.  Right upper lobe pulmonary mass which demonstrates low level hypermetabolism and slow interval growth since 2011.  Most consistent with low grade adenocarcinoma. 2.  A mildly hypermetabolic, mildly enlarged azygo-esophageal recess node is similar in size back to 2011.  Although favored to be reactive, technically indeterminate. 3.  Otherwise, no evidence of metastatic disease. 4.  Sinus disease.   Original Report Authenticated By: Jeronimo Greaves, M.D.   PFT's  08/05/2000  FEV1 1.7 84 %  DLCO 20.07 93%  05/03/2013  FEV1 2.03  105%  DLCO 20.63 95%  Assessment / Plan:  I reviewed the findings with the patient, last week and again this week with her 4 daughters present. With increase in size of the right lung mass I have recommended we proceed with resection of the slowing enlarging hypermetabolic lesion. The risks and options are discussed with the patient and her daughters in detail. We discussed repeating the ENB, but regardless of the findings the lesion is still highly suspicious now that it is enlarging and become hypermetabolic. The patient is willing to proceed we tentatively arranged for bronchoscopy right video-assisted thoracoscopy and lung resection on Monday, June 16. I spent more than 30 minutes explaining to the patient's family and her presents the planned procedure and the pros and cons of surgical intervention.   Delight Ovens MD

## 2013-05-04 ENCOUNTER — Other Ambulatory Visit: Payer: Self-pay | Admitting: *Deleted

## 2013-05-04 ENCOUNTER — Encounter (HOSPITAL_COMMUNITY): Payer: Self-pay | Admitting: Pharmacy Technician

## 2013-05-04 DIAGNOSIS — R911 Solitary pulmonary nodule: Secondary | ICD-10-CM

## 2013-05-09 ENCOUNTER — Telehealth: Payer: Self-pay | Admitting: *Deleted

## 2013-05-09 NOTE — Pre-Procedure Instructions (Addendum)
Alexa [name]  05/09/2013   Your procedure is scheduled on:05/14/13  Report to [facility] at 530AM.  Call this number if you have problems the morning of surgery: [phone]   Remember:   Do not eat food or drink liquids after midnight.   Take these medicines the morning of surgery with A SIP OF WATER:aricept, synthroid, morphone/ oxycodone if needed   Do not wear jewelry, make-up or nail polish.  Do not wear lotions, powders, or perfumes. You may wear deodorant.  Do not shave 48 hours prior to surgery. Men may shave face and neck.  Do not bring valuables to the hospital.  [facility] is not responsible                   for any belongings or valuables.  Contacts, dentures or bridgework may not be worn into surgery.  Leave suitcase in the car. After surgery it may be brought to your room.  For patients admitted to the hospital, checkout time is 11:00 AM the day of  discharge.   Patients discharged the day of surgery will not be allowed to drive  home.  Name and phone number of your driver:   Special Instructions: Incentive Spirometry - Practice and bring it with you on the day of surgery. Shower using CHG 2 nights before surgery and the night before surgery.  If you shower the day of surgery use CHG.  Use special wash - you have one bottle of CHG for all showers.  You should use approximately 1/3 of the bottle for each shower.   Please read over the following fact sheets that you were given: Pain Booklet, Coughing and Deep Breathing, Blood Transfusion Information, MRSA Information and Surgical Site Infection Prevention

## 2013-05-09 NOTE — Telephone Encounter (Signed)
I called patient to f/u [facility] 05/03/13.  Patient reports that she is doing fine at this time.  She spoke with someone who called her yesterday to discuss her sugery.  She states that she does not have any questions or concerns at this time.  Patient encouraged to call for any needs.  Patient verbalized understanding.

## 2013-05-10 ENCOUNTER — Encounter (HOSPITAL_COMMUNITY): Payer: Self-pay

## 2013-05-10 ENCOUNTER — Encounter (HOSPITAL_COMMUNITY)
Admission: RE | Admit: 2013-05-10 | Discharge: 2013-05-10 | Disposition: A | Discharge status: Home / Self Care | Payer: Medicare Other | Source: Ambulatory Visit | Attending: Cardiothoracic Surgery | Admitting: Cardiothoracic Surgery

## 2013-05-10 ENCOUNTER — Ambulatory Visit (HOSPITAL_COMMUNITY)
Admission: RE | Admit: 2013-05-10 | Discharge: 2013-05-10 | Disposition: A | Discharge status: Home / Self Care | Payer: Medicare Other | Source: Ambulatory Visit | Attending: Cardiothoracic Surgery | Admitting: Cardiothoracic Surgery

## 2013-05-10 VITALS — BP 124/74 | HR 75 | Temp 98.2°F | Resp 20 | Ht 62.0 in | Wt 191.5 lb

## 2013-05-10 DIAGNOSIS — R911 Solitary pulmonary nodule: Secondary | ICD-10-CM

## 2013-05-10 DIAGNOSIS — I10 Essential (primary) hypertension: Secondary | ICD-10-CM | POA: Insufficient documentation

## 2013-05-10 DIAGNOSIS — Z01818 Encounter for other preprocedural examination: Secondary | ICD-10-CM | POA: Insufficient documentation

## 2013-05-10 HISTORY — DX: Hypothyroidism, unspecified: E03.9

## 2013-05-10 LAB — BLOOD GAS, ARTERIAL
Acid-Base Excess: 3.1 mmol/L — ABNORMAL HIGH (ref 0.0–2.0)
Bicarbonate: 26.9 mEq/L — ABNORMAL HIGH (ref 20.0–24.0)
Drawn by: 206361
FIO2: 0.21 %
O2 Saturation: 96.2 %
Patient temperature: 98.6
TCO2: 28.1 mmol/L (ref 0–100)
pCO2 arterial: 39.9 mmHg (ref 35.0–45.0)
pH, Arterial: 7.444 (ref 7.350–7.450)
pO2, Arterial: 73.8 mmHg — ABNORMAL LOW (ref 80.0–100.0)

## 2013-05-10 LAB — CBC
HCT: 39.7 % (ref 36.0–46.0)
Hemoglobin: 13.3 g/dL (ref 12.0–15.0)
MCH: 29 pg (ref 26.0–34.0)
MCHC: 33.5 g/dL (ref 30.0–36.0)
MCV: 86.5 fL (ref 78.0–100.0)
Platelets: 241 10*3/uL (ref 150–400)
RBC: 4.59 MIL/uL (ref 3.87–5.11)
RDW: 13.5 % (ref 11.5–15.5)
WBC: 7.7 10*3/uL (ref 4.0–10.5)

## 2013-05-10 LAB — COMPREHENSIVE METABOLIC PANEL
ALT: 13 U/L (ref 0–35)
AST: 19 U/L (ref 0–37)
Albumin: 4 g/dL (ref 3.5–5.2)
Alkaline Phosphatase: 92 U/L (ref 39–117)
BUN: 33 mg/dL — ABNORMAL HIGH (ref 6–23)
CO2: 24 mEq/L (ref 19–32)
Calcium: 9.7 mg/dL (ref 8.4–10.5)
Chloride: 101 mEq/L (ref 96–112)
Creatinine, Ser: 1.06 mg/dL (ref 0.50–1.10)
GFR calc Af Amer: 58 mL/min — ABNORMAL LOW (ref >90)
GFR calc non Af Amer: 50 mL/min — ABNORMAL LOW (ref >90)
Glucose, Bld: 135 mg/dL — ABNORMAL HIGH (ref 70–99)
Potassium: 3.9 mEq/L (ref 3.5–5.1)
Sodium: 139 mEq/L (ref 135–145)
Total Bilirubin: 0.7 mg/dL (ref 0.3–1.2)
Total Protein: 8.2 g/dL (ref 6.0–8.3)

## 2013-05-10 LAB — PROTIME-INR
INR: 0.91 (ref 0.00–1.49)
Prothrombin Time: 12.2 seconds (ref 11.6–15.2)

## 2013-05-10 LAB — TYPE AND SCREEN
ABO/RH(D): O POS
Antibody Screen: NEGATIVE

## 2013-05-10 LAB — SURGICAL PCR SCREEN: Staphylococcus aureus: NEGATIVE

## 2013-05-10 LAB — APTT: aPTT: 32 seconds (ref 24–37)

## 2013-05-13 MED ORDER — VANCOMYCIN HCL IN DEXTROSE 1-5 GM/200ML-% IV SOLN
1000.0000 mg | INTRAVENOUS | Status: AC
  Administered 2013-05-14: 1000 mg via INTRAVENOUS
  Filled 2013-05-13 (×2): qty 200

## 2013-05-14 ENCOUNTER — Encounter (HOSPITAL_COMMUNITY): Payer: Self-pay | Admitting: *Deleted

## 2013-05-14 ENCOUNTER — Inpatient Hospital Stay (HOSPITAL_COMMUNITY): Payer: Medicare Other | Admitting: Certified Registered"

## 2013-05-14 ENCOUNTER — Encounter (HOSPITAL_COMMUNITY)
Admission: RE | Disposition: A | Discharge status: Home / Self Care | Payer: Self-pay | Source: Ambulatory Visit | Attending: Cardiothoracic Surgery

## 2013-05-14 ENCOUNTER — Inpatient Hospital Stay (HOSPITAL_COMMUNITY)
Admission: RE | Admit: 2013-05-14 | Discharge: 2013-05-21 | DRG: 164 | Disposition: A | Discharge status: Home / Self Care | Payer: Medicare Other | Source: Ambulatory Visit | Attending: Cardiothoracic Surgery | Admitting: Cardiothoracic Surgery

## 2013-05-14 ENCOUNTER — Inpatient Hospital Stay (HOSPITAL_COMMUNITY): Payer: Medicare Other

## 2013-05-14 ENCOUNTER — Encounter (HOSPITAL_COMMUNITY): Payer: Self-pay | Admitting: Certified Registered"

## 2013-05-14 DIAGNOSIS — Z853 Personal history of malignant neoplasm of breast: Secondary | ICD-10-CM

## 2013-05-14 DIAGNOSIS — R911 Solitary pulmonary nodule: Secondary | ICD-10-CM

## 2013-05-14 DIAGNOSIS — Z902 Acquired absence of lung [part of]: Secondary | ICD-10-CM

## 2013-05-14 DIAGNOSIS — E876 Hypokalemia: Secondary | ICD-10-CM

## 2013-05-14 DIAGNOSIS — E559 Vitamin D deficiency, unspecified: Secondary | ICD-10-CM | POA: Diagnosis present

## 2013-05-14 DIAGNOSIS — D381 Neoplasm of uncertain behavior of trachea, bronchus and lung: Secondary | ICD-10-CM

## 2013-05-14 DIAGNOSIS — I4891 Unspecified atrial fibrillation: Secondary | ICD-10-CM

## 2013-05-14 DIAGNOSIS — C3491 Malignant neoplasm of unspecified part of right bronchus or lung: Secondary | ICD-10-CM | POA: Diagnosis present

## 2013-05-14 DIAGNOSIS — K219 Gastro-esophageal reflux disease without esophagitis: Secondary | ICD-10-CM | POA: Diagnosis present

## 2013-05-14 DIAGNOSIS — E785 Hyperlipidemia, unspecified: Secondary | ICD-10-CM | POA: Diagnosis present

## 2013-05-14 DIAGNOSIS — Z6835 Body mass index (BMI) 35.0-35.9, adult: Secondary | ICD-10-CM

## 2013-05-14 DIAGNOSIS — I739 Peripheral vascular disease, unspecified: Secondary | ICD-10-CM | POA: Diagnosis present

## 2013-05-14 DIAGNOSIS — I10 Essential (primary) hypertension: Secondary | ICD-10-CM | POA: Diagnosis present

## 2013-05-14 DIAGNOSIS — C3410 Malignant neoplasm of upper lobe, unspecified bronchus or lung: Principal | ICD-10-CM | POA: Diagnosis present

## 2013-05-14 DIAGNOSIS — G609 Hereditary and idiopathic neuropathy, unspecified: Secondary | ICD-10-CM | POA: Diagnosis present

## 2013-05-14 DIAGNOSIS — M549 Dorsalgia, unspecified: Secondary | ICD-10-CM | POA: Diagnosis present

## 2013-05-14 DIAGNOSIS — J95812 Postprocedural air leak: Secondary | ICD-10-CM | POA: Diagnosis not present

## 2013-05-14 DIAGNOSIS — G8929 Other chronic pain: Secondary | ICD-10-CM | POA: Diagnosis present

## 2013-05-14 DIAGNOSIS — Z87891 Personal history of nicotine dependence: Secondary | ICD-10-CM

## 2013-05-14 DIAGNOSIS — M129 Arthropathy, unspecified: Secondary | ICD-10-CM | POA: Diagnosis present

## 2013-05-14 DIAGNOSIS — Z86718 Personal history of other venous thrombosis and embolism: Secondary | ICD-10-CM

## 2013-05-14 HISTORY — PX: VIDEO BRONCHOSCOPY: SHX5072

## 2013-05-14 HISTORY — PX: VIDEO ASSISTED THORACOSCOPY (VATS)/ LOBECTOMY: SHX6169

## 2013-05-14 SURGERY — BRONCHOSCOPY, VIDEO-ASSISTED
Anesthesia: General | Site: Chest | Laterality: Right | Wound class: Clean Contaminated

## 2013-05-14 MED ORDER — ROCURONIUM BROMIDE 100 MG/10ML IV SOLN
INTRAVENOUS | Status: DC | PRN
  Administered 2013-05-14: 50 mg via INTRAVENOUS

## 2013-05-14 MED ORDER — LEVALBUTEROL HCL 0.63 MG/3ML IN NEBU
0.6300 mg | INHALATION_SOLUTION | Freq: Four times a day (QID) | RESPIRATORY_TRACT | Status: DC
  Administered 2013-05-14 – 2013-05-16 (×8): 0.63 mg via RESPIRATORY_TRACT
  Filled 2013-05-14 (×16): qty 3

## 2013-05-14 MED ORDER — OXYCODONE HCL 5 MG PO TABS
5.0000 mg | ORAL_TABLET | Freq: Once | ORAL | Status: AC | PRN
  Administered 2013-05-14: 5 mg via ORAL

## 2013-05-14 MED ORDER — LIDOCAINE HCL (CARDIAC) 20 MG/ML IV SOLN
INTRAVENOUS | Status: DC | PRN
  Administered 2013-05-14: 20 mg via INTRAVENOUS

## 2013-05-14 MED ORDER — HYDROMORPHONE HCL PF 1 MG/ML IJ SOLN
INTRAMUSCULAR | Status: AC
  Filled 2013-05-14: qty 1

## 2013-05-14 MED ORDER — OXYCODONE HCL 5 MG PO TABS
ORAL_TABLET | ORAL | Status: AC
  Filled 2013-05-14: qty 1

## 2013-05-14 MED ORDER — GLYCOPYRROLATE 0.2 MG/ML IJ SOLN
INTRAMUSCULAR | Status: DC | PRN
  Administered 2013-05-14: 0.4 mg via INTRAVENOUS

## 2013-05-14 MED ORDER — POTASSIUM CHLORIDE 10 MEQ/50ML IV SOLN
10.0000 meq | Freq: Every day | INTRAVENOUS | Status: DC | PRN
  Administered 2013-05-17 (×3): 10 meq via INTRAVENOUS
  Filled 2013-05-14: qty 50
  Filled 2013-05-14: qty 150
  Filled 2013-05-14 (×3): qty 50

## 2013-05-14 MED ORDER — MIDAZOLAM HCL 5 MG/5ML IJ SOLN
INTRAMUSCULAR | Status: DC | PRN
  Administered 2013-05-14: 2 mg via INTRAVENOUS

## 2013-05-14 MED ORDER — VECURONIUM BROMIDE 10 MG IV SOLR
INTRAVENOUS | Status: DC | PRN
  Administered 2013-05-14: 2 mg via INTRAVENOUS
  Administered 2013-05-14 (×2): 1 mg via INTRAVENOUS
  Administered 2013-05-14: 4 mg via INTRAVENOUS
  Administered 2013-05-14: 2 mg via INTRAVENOUS

## 2013-05-14 MED ORDER — OXYCODONE HCL 5 MG/5ML PO SOLN: 5.0000 mg | Freq: Once | ORAL | Status: AC | PRN

## 2013-05-14 MED ORDER — DONEPEZIL HCL 10 MG PO TABS
10.0000 mg | ORAL_TABLET | Freq: Every evening | ORAL | Status: DC | PRN
  Filled 2013-05-14: qty 1

## 2013-05-14 MED ORDER — OXYCODONE-ACETAMINOPHEN 5-325 MG PO TABS
1.0000 | ORAL_TABLET | ORAL | Status: DC | PRN
  Administered 2013-05-15: 2 via ORAL
  Administered 2013-05-15: 1 via ORAL
  Administered 2013-05-16 – 2013-05-21 (×19): 2 via ORAL
  Filled 2013-05-14 (×19): qty 2
  Filled 2013-05-14: qty 1
  Filled 2013-05-14 (×2): qty 2

## 2013-05-14 MED ORDER — LEVOTHYROXINE SODIUM 137 MCG PO TABS
137.0000 ug | ORAL_TABLET | Freq: Every day | ORAL | Status: DC
Start: 2013-05-15 — End: 2013-05-21
  Administered 2013-05-15 – 2013-05-21 (×7): 137 ug via ORAL
  Filled 2013-05-14 (×8): qty 1

## 2013-05-14 MED ORDER — BUPIVACAINE 0.5 % ON-Q PUMP SINGLE CATH 400 ML
400.0000 mL | INJECTION | Status: AC
  Filled 2013-05-14: qty 400

## 2013-05-14 MED ORDER — LACTATED RINGERS IV SOLN
INTRAVENOUS | Status: DC | PRN
  Administered 2013-05-14: 07:00:00 via INTRAVENOUS

## 2013-05-14 MED ORDER — FENTANYL 10 MCG/ML IV SOLN
INTRAVENOUS | Status: DC
  Administered 2013-05-14: 21:00:00 via INTRAVENOUS
  Administered 2013-05-14: 150 ug/h via INTRAVENOUS
  Administered 2013-05-14: 285 ug via INTRAVENOUS
  Administered 2013-05-14: 13:00:00 via INTRAVENOUS
  Administered 2013-05-15: 270 ug via INTRAVENOUS
  Administered 2013-05-15: 22:00:00 via INTRAVENOUS
  Administered 2013-05-15: 180 ug via INTRAVENOUS
  Administered 2013-05-15: 272.2 ug via INTRAVENOUS
  Administered 2013-05-15: 13:00:00 via INTRAVENOUS
  Administered 2013-05-15: 210 ug via INTRAVENOUS
  Administered 2013-05-15: 06:00:00 via INTRAVENOUS
  Administered 2013-05-15: 115.6 ug via INTRAVENOUS
  Administered 2013-05-15: 120 ug via INTRAVENOUS
  Administered 2013-05-15: 300 ug via INTRAVENOUS
  Administered 2013-05-15: 180 ug via INTRAVENOUS
  Administered 2013-05-16: 285 ug via INTRAVENOUS
  Administered 2013-05-16: 155 ug via INTRAVENOUS
  Administered 2013-05-16: 07:00:00 via INTRAVENOUS
  Administered 2013-05-16: 130 ug via INTRAVENOUS
  Administered 2013-05-16: 90 ug via INTRAVENOUS
  Administered 2013-05-16: 120 ug via INTRAVENOUS
  Administered 2013-05-17: 44.28 ug via INTRAVENOUS
  Administered 2013-05-17: 44 ug via INTRAVENOUS
  Administered 2013-05-17: 75 ug via INTRAVENOUS
  Administered 2013-05-17: 195 ug via INTRAVENOUS
  Filled 2013-05-14 (×7): qty 50

## 2013-05-14 MED ORDER — BUPIVACAINE 0.5 % ON-Q PUMP SINGLE CATH 400 ML
INJECTION | Status: DC | PRN
  Administered 2013-05-14: 400 mL

## 2013-05-14 MED ORDER — FUROSEMIDE 40 MG PO TABS
40.0000 mg | ORAL_TABLET | Freq: Two times a day (BID) | ORAL | Status: DC
  Administered 2013-05-15 – 2013-05-21 (×13): 40 mg via ORAL
  Filled 2013-05-14 (×16): qty 1

## 2013-05-14 MED ORDER — MIDAZOLAM HCL 2 MG/2ML IJ SOLN: 0.5000 mg | Freq: Once | INTRAMUSCULAR | Status: DC | PRN

## 2013-05-14 MED ORDER — OXYCODONE HCL 5 MG PO TABS
5.0000 mg | ORAL_TABLET | ORAL | Status: AC | PRN
  Administered 2013-05-14 – 2013-05-15 (×4): 10 mg via ORAL
  Filled 2013-05-14 (×4): qty 2

## 2013-05-14 MED ORDER — VANCOMYCIN HCL IN DEXTROSE 1-5 GM/200ML-% IV SOLN
1000.0000 mg | Freq: Two times a day (BID) | INTRAVENOUS | Status: AC
  Administered 2013-05-14: 1000 mg via INTRAVENOUS
  Filled 2013-05-14: qty 200

## 2013-05-14 MED ORDER — FENTANYL CITRATE 0.05 MG/ML IJ SOLN
INTRAMUSCULAR | Status: DC | PRN
  Administered 2013-05-14 (×2): 50 ug via INTRAVENOUS
  Administered 2013-05-14: 100 ug via INTRAVENOUS
  Administered 2013-05-14: 150 ug via INTRAVENOUS
  Administered 2013-05-14 (×3): 50 ug via INTRAVENOUS

## 2013-05-14 MED ORDER — PROMETHAZINE HCL 25 MG/ML IJ SOLN: 6.2500 mg | INTRAMUSCULAR | Status: DC | PRN

## 2013-05-14 MED ORDER — DEXTROSE-NACL 5-0.45 % IV SOLN
INTRAVENOUS | Status: DC
  Administered 2013-05-14: 17:00:00 via INTRAVENOUS
  Administered 2013-05-15: 20 mL via INTRAVENOUS
  Administered 2013-05-17: 20 mL/h via INTRAVENOUS

## 2013-05-14 MED ORDER — HEMOSTATIC AGENTS (NO CHARGE) OPTIME
TOPICAL | Status: DC | PRN
  Administered 2013-05-14: 1 via TOPICAL

## 2013-05-14 MED ORDER — ATORVASTATIN CALCIUM 20 MG PO TABS
20.0000 mg | ORAL_TABLET | Freq: Every day | ORAL | Status: DC
  Administered 2013-05-15 – 2013-05-20 (×6): 20 mg via ORAL
  Filled 2013-05-14 (×8): qty 1

## 2013-05-14 MED ORDER — DIPHENHYDRAMINE HCL 50 MG/ML IJ SOLN: 12.5000 mg | Freq: Four times a day (QID) | INTRAMUSCULAR | Status: DC | PRN

## 2013-05-14 MED ORDER — TRAVOPROST (BAK FREE) 0.004 % OP SOLN
1.0000 [drp] | Freq: Every day | OPHTHALMIC | Status: DC
  Administered 2013-05-14 – 2013-05-20 (×6): 1 [drp] via OPHTHALMIC
  Filled 2013-05-14: qty 2.5

## 2013-05-14 MED ORDER — DIPHENHYDRAMINE HCL 12.5 MG/5ML PO ELIX
12.5000 mg | ORAL_SOLUTION | Freq: Four times a day (QID) | ORAL | Status: DC | PRN
  Filled 2013-05-14: qty 5

## 2013-05-14 MED ORDER — 0.9 % SODIUM CHLORIDE (POUR BTL) OPTIME
TOPICAL | Status: DC | PRN
  Administered 2013-05-14: 1000 mL

## 2013-05-14 MED ORDER — SODIUM CHLORIDE 0.9 % IJ SOLN: 9.0000 mL | INTRAMUSCULAR | Status: DC | PRN

## 2013-05-14 MED ORDER — ACETAMINOPHEN 10 MG/ML IV SOLN
1000.0000 mg | Freq: Four times a day (QID) | INTRAVENOUS | Status: AC
  Administered 2013-05-14 – 2013-05-15 (×3): 1000 mg via INTRAVENOUS
  Filled 2013-05-14 (×7): qty 100

## 2013-05-14 MED ORDER — NALOXONE HCL 0.4 MG/ML IJ SOLN: 0.4000 mg | INTRAMUSCULAR | Status: DC | PRN

## 2013-05-14 MED ORDER — POTASSIUM CHLORIDE CRYS ER 20 MEQ PO TBCR
20.0000 meq | EXTENDED_RELEASE_TABLET | Freq: Every day | ORAL | Status: DC | PRN
  Administered 2013-05-21: 20 meq via ORAL
  Filled 2013-05-14: qty 1

## 2013-05-14 MED ORDER — ONDANSETRON HCL 4 MG/2ML IJ SOLN
INTRAMUSCULAR | Status: DC | PRN
  Administered 2013-05-14: 4 mg via INTRAVENOUS

## 2013-05-14 MED ORDER — BISACODYL 5 MG PO TBEC
10.0000 mg | DELAYED_RELEASE_TABLET | Freq: Every day | ORAL | Status: DC
  Administered 2013-05-15 – 2013-05-21 (×6): 10 mg via ORAL
  Filled 2013-05-14: qty 2
  Filled 2013-05-14: qty 1
  Filled 2013-05-14 (×3): qty 2
  Filled 2013-05-14: qty 1
  Filled 2013-05-14: qty 2

## 2013-05-14 MED ORDER — IRBESARTAN 300 MG PO TABS
300.0000 mg | ORAL_TABLET | Freq: Every day | ORAL | Status: DC
  Administered 2013-05-15 – 2013-05-18 (×4): 300 mg via ORAL
  Filled 2013-05-14 (×5): qty 1

## 2013-05-14 MED ORDER — MEPERIDINE HCL 25 MG/ML IJ SOLN: 6.2500 mg | INTRAMUSCULAR | Status: DC | PRN

## 2013-05-14 MED ORDER — PROPOFOL 10 MG/ML IV BOLUS
INTRAVENOUS | Status: DC | PRN
  Administered 2013-05-14: 100 mg via INTRAVENOUS

## 2013-05-14 MED ORDER — ONDANSETRON HCL 4 MG/2ML IJ SOLN: 4.0000 mg | Freq: Four times a day (QID) | INTRAMUSCULAR | Status: DC | PRN

## 2013-05-14 MED ORDER — SENNOSIDES-DOCUSATE SODIUM 8.6-50 MG PO TABS
1.0000 | ORAL_TABLET | Freq: Every evening | ORAL | Status: DC | PRN
  Filled 2013-05-14: qty 1

## 2013-05-14 MED ORDER — HYDROMORPHONE HCL PF 1 MG/ML IJ SOLN
0.2500 mg | INTRAMUSCULAR | Status: DC | PRN
  Administered 2013-05-14 (×4): 0.5 mg via INTRAVENOUS

## 2013-05-14 MED ORDER — NEOSTIGMINE METHYLSULFATE 1 MG/ML IJ SOLN
INTRAMUSCULAR | Status: DC | PRN
  Administered 2013-05-14: 3 mg via INTRAVENOUS

## 2013-05-14 MED ORDER — ONDANSETRON HCL 4 MG/2ML IJ SOLN
4.0000 mg | Freq: Four times a day (QID) | INTRAMUSCULAR | Status: DC | PRN
  Administered 2013-05-14: 4 mg via INTRAVENOUS
  Filled 2013-05-14 (×2): qty 2

## 2013-05-14 SURGICAL SUPPLY — 95 items
APL SRG 22X2 LUM MLBL SLNT (VASCULAR PRODUCTS)
APL SRG 7X2 LUM MLBL SLNT (VASCULAR PRODUCTS)
APPLICATOR TIP COSEAL (VASCULAR PRODUCTS) IMPLANT
APPLICATOR TIP EXT COSEAL (VASCULAR PRODUCTS) IMPLANT
BLADE SURG 11 STRL SS (BLADE) IMPLANT
BRUSH CYTOL CELLEBRITY 1.5X140 (MISCELLANEOUS) IMPLANT
CANISTER SUCTION 2500CC (MISCELLANEOUS) ×3 IMPLANT
CATH KIT ON Q 5IN SLV (PAIN MANAGEMENT) IMPLANT
CATH KIT ON-Q SILVERSOAKER 2.5 (CATHETERS) ×1 IMPLANT
CATH THORACIC 28FR (CATHETERS) ×2 IMPLANT
CATH THORACIC 36FR (CATHETERS) IMPLANT
CATH THORACIC 36FR RT ANG (CATHETERS) IMPLANT
CLIP TI MEDIUM 24 (CLIP) ×1 IMPLANT
CLIP TI MEDIUM 6 (CLIP) IMPLANT
CLOTH BEACON ORANGE TIMEOUT ST (SAFETY) ×3 IMPLANT
CONN ST 1/4X3/8  BEN (MISCELLANEOUS) ×2
CONN ST 1/4X3/8 BEN (MISCELLANEOUS) IMPLANT
CONN Y 3/8X3/8X3/8  BEN (MISCELLANEOUS) ×1
CONN Y 3/8X3/8X3/8 BEN (MISCELLANEOUS) IMPLANT
CONT SPEC 4OZ CLIKSEAL STRL BL (MISCELLANEOUS) ×14 IMPLANT
COVER TABLE BACK 60X90 (DRAPES) ×3 IMPLANT
DRAPE LAPAROSCOPIC ABDOMINAL (DRAPES) ×3 IMPLANT
DRAPE SLUSH/WARMER DISC (DRAPES) ×1 IMPLANT
DRAPE WARM FLUID 44X44 (DRAPE) ×2 IMPLANT
DRILL BIT 7/64X5 (BIT) ×1 IMPLANT
ELECT BLADE 4.0 EZ CLEAN MEGAD (MISCELLANEOUS) ×3
ELECT REM PT RETURN 9FT ADLT (ELECTROSURGICAL) ×3
ELECTRODE BLDE 4.0 EZ CLN MEGD (MISCELLANEOUS) ×2 IMPLANT
ELECTRODE REM PT RTRN 9FT ADLT (ELECTROSURGICAL) ×2 IMPLANT
FORCEPS BIOP RJ4 1.8 (CUTTING FORCEPS) IMPLANT
GLOVE BIO SURGEON STRL SZ 6 (GLOVE) ×4 IMPLANT
GLOVE BIO SURGEON STRL SZ 6.5 (GLOVE) ×8 IMPLANT
GLOVE BIOGEL PI IND STRL 6 (GLOVE) IMPLANT
GLOVE BIOGEL PI IND STRL 6.5 (GLOVE) IMPLANT
GLOVE BIOGEL PI INDICATOR 6 (GLOVE) ×2
GLOVE BIOGEL PI INDICATOR 6.5 (GLOVE) ×2
GOWN STRL NON-REIN LRG LVL3 (GOWN DISPOSABLE) ×12 IMPLANT
HANDLE STAPLE ENDO GIA SHORT (STAPLE) ×1
HEMOSTAT SURGICEL 2X14 (HEMOSTASIS) ×1 IMPLANT
KIT BASIN OR (CUSTOM PROCEDURE TRAY) ×3 IMPLANT
KIT ROOM TURNOVER OR (KITS) ×3 IMPLANT
KIT SUCTION CATH 14FR (SUCTIONS) ×3 IMPLANT
MARKER SKIN DUAL TIP RULER LAB (MISCELLANEOUS) ×3 IMPLANT
NDL BIOPSY TRANSBRONCH 21G (NEEDLE) IMPLANT
NEEDLE BIOPSY TRANSBRONCH 21G (NEEDLE) IMPLANT
NS IRRIG 1000ML POUR BTL (IV SOLUTION) ×7 IMPLANT
OIL SILICONE PENTAX (PARTS (SERVICE/REPAIRS)) ×3 IMPLANT
PACK CHEST (CUSTOM PROCEDURE TRAY) ×3 IMPLANT
PAD ARMBOARD 7.5X6 YLW CONV (MISCELLANEOUS) ×6 IMPLANT
RELOAD EGIA 45 MED/THCK PURPLE (STAPLE) ×4 IMPLANT
RELOAD EGIA 60 MED/THCK PURPLE (STAPLE) ×18 IMPLANT
RELOAD EGIA BLACK ROTIC 45MM (STAPLE) ×3 IMPLANT
RELOAD EGIA TRIS TAN 45 CVD (STAPLE) ×12 IMPLANT
RELOAD STAPLE 45 BLK XTHK (STAPLE) IMPLANT
RELOAD STAPLE 45 TAN MED CVD (STAPLE) IMPLANT
RELOAD STAPLE 60 MED/THCK ART (STAPLE) IMPLANT
SCISSORS LAP 5X35 DISP (ENDOMECHANICALS) IMPLANT
SEALANT PROGEL (MISCELLANEOUS) IMPLANT
SEALANT SURG COSEAL 4ML (VASCULAR PRODUCTS) IMPLANT
SEALANT SURG COSEAL 8ML (VASCULAR PRODUCTS) IMPLANT
SOLUTION ANTI FOG 6CC (MISCELLANEOUS) ×3 IMPLANT
SPONGE GAUZE 4X4 12PLY (GAUZE/BANDAGES/DRESSINGS) ×3 IMPLANT
STAPLER ENDO GIA 12 SHRT THIN (STAPLE) IMPLANT
STAPLER ENDO GIA 12MM SHORT (STAPLE) ×2 IMPLANT
SUT PROLENE 3 0 SH DA (SUTURE) IMPLANT
SUT PROLENE 4 0 RB 1 (SUTURE)
SUT PROLENE 4-0 RB1 .5 CRCL 36 (SUTURE) IMPLANT
SUT SILK  1 MH (SUTURE) ×4
SUT SILK 1 MH (SUTURE) ×4 IMPLANT
SUT SILK 2 0 SH (SUTURE) ×1 IMPLANT
SUT SILK 2 0SH CR/8 30 (SUTURE) IMPLANT
SUT SILK 3 0SH CR/8 30 (SUTURE) IMPLANT
SUT STEEL 1 (SUTURE) IMPLANT
SUT VIC AB 1 CTX 18 (SUTURE) ×1 IMPLANT
SUT VIC AB 1 CTX 36 (SUTURE)
SUT VIC AB 1 CTX36XBRD ANBCTR (SUTURE) IMPLANT
SUT VIC AB 2-0 CTX 36 (SUTURE) ×1 IMPLANT
SUT VIC AB 2-0 UR6 27 (SUTURE) IMPLANT
SUT VIC AB 3-0 SH 8-18 (SUTURE) IMPLANT
SUT VIC AB 3-0 X1 27 (SUTURE) ×1 IMPLANT
SUT VICRYL 2 TP 1 (SUTURE) ×1 IMPLANT
SWAB COLLECTION DEVICE MRSA (MISCELLANEOUS) IMPLANT
SYR 20ML ECCENTRIC (SYRINGE) ×3 IMPLANT
SYSTEM SAHARA CHEST DRAIN ATS (WOUND CARE) ×3 IMPLANT
TAPE CLOTH SURG 4X10 WHT LF (GAUZE/BANDAGES/DRESSINGS) ×1 IMPLANT
TAPE UMBILICAL 1/8 X36 TWILL (MISCELLANEOUS) ×1 IMPLANT
TIP APPLICATOR SPRAY EXTEND 16 (VASCULAR PRODUCTS) IMPLANT
TOWEL OR 17X24 6PK STRL BLUE (TOWEL DISPOSABLE) ×6 IMPLANT
TOWEL OR 17X26 10 PK STRL BLUE (TOWEL DISPOSABLE) ×6 IMPLANT
TRAP SPECIMEN MUCOUS 40CC (MISCELLANEOUS) ×5 IMPLANT
TRAY FOLEY CATH 14FRSI W/METER (CATHETERS) ×3 IMPLANT
TUBE ANAEROBIC SPECIMEN COL (MISCELLANEOUS) IMPLANT
TUBE CONNECTING 12X1/4 (SUCTIONS) ×6 IMPLANT
TUNNELER SHEATH ON-Q 11GX8 (MISCELLANEOUS) ×5 IMPLANT
WATER STERILE IRR 1000ML POUR (IV SOLUTION) ×6 IMPLANT

## 2013-05-14 NOTE — H&P (Signed)
[address]       [place] [address]             [phone]                      Alexa [name] Date of Birth: 24-Jun-1936  Dr Nigel Sloop, MD  Chief Complaint:  Right Lung Mass   History of Present Illness:  Patient returns today for followup visit concerning possible right lung lesion following  her since March of 2011 when a CT scan done March 1 raised the issue of a right lung lesion in looking back at old films this infiltrative process has been present since 2009. ENB biopsy by Dr Edwyna Shell was negative on 09/07/2010 but because of the question of possible slow-growing bronchial alveolar carcinoma presenting in this fashion I  continued to follow the patient carefully.  She notes her respiratory status has been stable without increasing shortness of breath. She denies chest pain or anginal symptoms   Past Medical History  Diagnosis Date  . Hyperlipidemia   . Hypertension   . Cancer     breast  . GERD (gastroesophageal reflux disease)   . Diverticulosis   . Hx of adenomatous colonic polyps   . DVT (deep venous thrombosis) 09-30-11    right lower leg   . Arthritis   . Hypothyroidism   . Dementia     sees Dr. Vickey Huger  . Peripheral vascular disease 15    leg clot? rx  . History of kidney stones     Past Surgical History  Procedure Laterality Date  . Cholecystectomy  2007  . Breast surgery  2002    BL mastectomy  . Lumbar laminectomy      x3  . Rotator cuff repair Right 10  . Cervical spine surgery    . Hip surgery    . Spine surgery    . Abdominal hysterectomy    . Fiberoptic bronchoscopy with electromagnetic  10/07/2010    History  Smoking status  . Former Smoker -- 1.00 packs/day for 25 years  . Types: Cigarettes  . Quit date: 05/10/1980  Smokeless tobacco  . Never Used    History  Alcohol Use No    History   Social History  . Marital Status: Widowed    Spouse Name: N/A    Number of Children: N/A    . Years of Education: N/A   Occupational History  . Not on file.   Social History Main Topics  . Smoking status: Former Smoker -- 1.00 packs/day for 25 years    Types: Cigarettes    Quit date: 05/10/1980  . Smokeless tobacco: Never Used  . Alcohol Use: No  . Drug Use: No  . Sexually Active: No   Other Topics Concern  . Not on file   Social History Narrative  . No narrative on file    Allergies  Allergen Reactions  . Strawberry Swelling    Swelling ,rash  . Hydrocodone-Homatropine     REACTION: Nausea  . Nsaids   . Penicillins     REACTION: rash  . Simvastatin     REACTION: muscle pain    Current Facility-Administered Medications  Medication Dose Route Frequency Provider Last Rate Last Dose  . vancomycin (VANCOCIN) IVPB 1000 mg/200 mL premix  1,000 mg Intravenous On Call to OR Delight Ovens, MD       Facility-Administered Medications Ordered in Other  Encounters  Medication Dose Route Frequency Provider Last Rate Last Dose  . fentaNYL (SUBLIMAZE) injection    Anesthesia Intra-op Sheppard Evens, CRNA   50 mcg at 05/14/13 0655  . lactated ringers infusion    Continuous PRN Sheppard Evens, CRNA      . midazolam (VERSED) 5 MG/5ML injection    Anesthesia Intra-op Sheppard Evens, CRNA   2 mg at 05/14/13 8295     Family History  Problem Relation Age of Onset  . Diabetes Daughter   . Diabetes Daughter   . Heart disease Father   . Heart disease Brother   . Heart disease Sister   . Colon cancer Neg Hx   . Colon polyps Daughter        Physical Exam: BP 145/72  Pulse 69  Temp(Src) 98.1 F (36.7 C) (Oral)  Resp 20  SpO2 94%  Overall the patient appears in no distress she is not short of breath at rest she has no jugular venous distention I do not appreciate any cervical or supraclavicular adenopathy or axillary adenopathy Her lungs are clear bilaterally Cardiac exam feels regular rate and rhythm without murmur gallop, she's had previous bilateral  mastectomy Lower extremity is have 2+ pedal edema bilaterally the patient  The right leg is healed from chronic uler  from her treatments in the wound care center  Diagnostic Studies & Laboratory data: Ct Chest Wo Contrast  04/05/2013   *RADIOLOGY REPORT*  Clinical Data: Follow-up right pulmonary nodule.  History of left- sided breast cancer 2002 status post bilateral mastectomies.  CT CHEST WITHOUT CONTRAST  Technique:  Multidetector CT imaging of the chest was performed following the standard protocol without IV contrast.  Comparison: 04/06/2012, 06/10/2011, 12/10/2010 similar exams  Findings: Cervical fusion hardware partly visualized.  9 mm pretracheal lymph node is stable.  Mild atheromatous aortic calcification without aneurysm.  Heart size is normal.  No pericardial or pleural effusion.  No new lymphadenopathy.  Using a similar measurement technique, there has been interval increase in irregular mass-like opacity with internal air bronchogram formation in the posterior right upper lobe, now 3.3 x 2.6 cm on image 22.  There is also minimal nodularity of peripheral spiculations emanating from this mass at this anatomic level. Central airways are patent.  Calcified left lower lobe 2 mm granuloma again noted image 39.  Minimal lingular scarring is stable.  No new area of consolidation, nodule, or mass.  Right humeral bone anchors noted.  There is mild streak artifact from thoracolumbar fusion hardware.  Left axillary clips and evidence of bilateral mastectomy is present.  No new lytic or sclerotic osseous lesion.  Incomplete imaging of the upper abdomen demonstrates normal- appearing adrenal glands. Cholecystectomy clips noted.  IMPRESSION: Increase in size of right upper lobe mass-like consolidation, highly suspicious for adenocarcinoma.  Consider surgical consultation.  No CT evidence for intrathoracic metastatic breast cancer.  These results will be called to the ordering clinician or representative by  the Radiologist Assistant, and communication documented in the PACS Dashboard.   Original Report Authenticated By: Christiana Pellant, M.D.   Nm Pet Image Initial (pi) Skull Base To Thigh  04/09/2013   *RADIOLOGY REPORT*  Clinical Data: Subsequent treatment strategy for enlarging right- sided lung lesion.  NUCLEAR MEDICINE PET SKULL BASE TO THIGH  Fasting Blood Glucose:  111  Technique:  18.3 mCi F-18 FDG was injected intravenously. CT data was obtained and used for attenuation correction and anatomic localization only.  (This was not  acquired as a diagnostic CT examination.) Additional exam technical data entered on technologist worksheet.  Comparison:  Chest CT of 04/05/2013.  PET of 01/27/2010.  Findings:  Neck: No abnormal hypermetabolism.  Chest:  Left-sided subclavian vascular hypermetabolism which is likely physiologic.  Posterior right upper lobe pulmonary opacity which measures 3.0 x 1.9 cm and a S.U.V. max of 3.5 on image 84.  Similar in size to the 04/05/2013 CT, but enlarged and newly hypermetabolic since 01/27/2010.  Subcarinal/azygo-esophageal recess node which measures 1.1 cm a S.U.V. max of 4.0 on image 92/series 2.  This activity is similar to the surrounding mediastinal pool and the node is similar in size to 01/27/2010 PET.  Abdomen/Pelvis:  No abnormal hypermetabolism.  Skeleton:  Muscular activity about the left posterior ribs is favored to be physiologic and may relate to breathing motion.  CT  images performed for attenuation correction demonstrate fluid and mucosal thickening in the left maxillary sinus.  Cervical spine fixation.  Mild cardiomegaly.  Chest findings deferred to recent diagnostic CT.  Beam hardening artifact from thoracolumbar spine fixation. Normal adrenal glands.  Cholecystectomy clips.  Underdistended gastric body.  Hysterectomy.  IMPRESSION:  1.  Right upper lobe pulmonary mass which demonstrates low level hypermetabolism and slow interval growth since 2011.  Most consistent  with low grade adenocarcinoma. 2.  A mildly hypermetabolic, mildly enlarged azygo-esophageal recess node is similar in size back to 2011.  Although favored to be reactive, technically indeterminate. 3.  Otherwise, no evidence of metastatic disease. 4.  Sinus disease.   Original Report Authenticated By: Jeronimo Greaves, M.D.   PFT's  08/05/2000  FEV1 1.7 84 %  DLCO 20.07 93%  05/03/2013  FEV1 2.03  105%  DLCO 20.63 95%  Assessment / Plan:  I reviewed the findings with the patient, last week and again this week with her 4 daughters present. With increase in size of the right lung mass I have recommended we proceed with resection of the slowing enlarging hypermetabolic lesion. The risks and options are discussed with the patient and her daughters in detail. We discussed repeating the ENB, but regardless of the findings the lesion is still highly suspicious now that it is enlarging and become hypermetabolic. The patient is willing to proceed we tentatively arranged for bronchoscopy right video-assisted thoracoscopy and lung resection on Monday, June 16. I spent more than 30 minutes explaining to the patient's family and her presents the planned procedure and the pros and cons of surgical intervention.  The goals risks and alternatives of the planned surgical procedure Bronchoscopy, Rt VATS, Lung resection  have been discussed with the patient in detail. The risks of the procedure including death, infection, stroke, myocardial infarction, bleeding, blood transfusion have all been discussed specifically.  I have quoted Winry A Branaman a 3 % of perioperative mortality and a complication rate as high as 20%. The patient's questions have been answered.Aman Batley Fulmore is willing  to proceed with the planned procedure.  Delight Ovens MD

## 2013-05-14 NOTE — Anesthesia Postprocedure Evaluation (Signed)
  Anesthesia Post-op Note  Patient: Alexa [name]  Procedure(s) Performed: Procedure(s): VIDEO BRONCHOSCOPY (N/A) VIDEO ASSISTED THORACOSCOPY (VATS)/ LOBECTOMY (Right)  Patient Location: PACU  Anesthesia Type:General  Level of Consciousness: awake, alert , oriented and patient cooperative  Airway and Oxygen Therapy: Patient Spontanous Breathing and Patient connected to nasal cannula oxygen  Post-op Pain: mild  Post-op Assessment: Post-op Vital signs reviewed, Patient's Cardiovascular Status Stable, Respiratory Function Stable, Patent Airway, No signs of Nausea or vomiting and Pain level controlled  Post-op Vital Signs: Reviewed and stable  Complications: No apparent anesthesia complications

## 2013-05-14 NOTE — Anesthesia Preprocedure Evaluation (Addendum)
Anesthesia Evaluation  Patient identified by MRN, date of birth, ID band Patient awake    Reviewed: Allergy & Precautions, H&P , NPO status , Patient's Chart, lab work & pertinent test results  History of Anesthesia Complications Negative for: history of anesthetic complications  Airway Mallampati: I TM Distance: >3 FB Neck ROM: Full    Dental  (+) Teeth Intact, Partial Upper and Dental Advisory Given   Pulmonary shortness of breath and with exertion, former smoker,  R lung mass breath sounds clear to auscultation        Cardiovascular hypertension, Pt. on medications + Peripheral Vascular Disease Rhythm:Regular Rate:Normal  '12 ECHO: normal LVF, EF 60-65%, valves OK   Neuro/Psych PSYCHIATRIC DISORDERS dementiaPeripheral neuropathy and CBP on home narcotics  Neuromuscular disease    GI/Hepatic GERD-  Medicated and Controlled,  Endo/Other  Hypothyroidism Morbid obesity  Renal/GU      Musculoskeletal   Abdominal   Peds  Hematology   Anesthesia Other Findings   Reproductive/Obstetrics                         Anesthesia Physical Anesthesia Plan  ASA: III  Anesthesia Plan: General   Post-op Pain Management:    Induction: Intravenous  Airway Management Planned: Double Lumen EBT and Oral ETT  Additional Equipment: Arterial line, CVP and Ultrasound Guidance Line Placement  Intra-op Plan:   Post-operative Plan: Extubation in OR  Informed Consent: I have reviewed the patients History and Physical, chart, labs and discussed the procedure including the risks, benefits and alternatives for the proposed anesthesia with the patient or authorized representative who has indicated his/her understanding and acceptance.   Dental advisory given  Plan Discussed with: Anesthesiologist, Surgeon and CRNA  Anesthesia Plan Comments: (Plan routine monitors, A line, CVP, GETA)       Anesthesia Quick  Evaluation

## 2013-05-14 NOTE — Anesthesia Procedure Notes (Addendum)
Procedure Name: Intubation Date/Time: 05/14/2013 7:36 AM Performed by: Arlice Colt B Pre-anesthesia Checklist: Patient identified, Emergency Drugs available, Suction available, Patient being monitored and Timeout performed Patient Re-evaluated:Patient Re-evaluated prior to inductionOxygen Delivery Method: Circle system utilized Preoxygenation: Pre-oxygenation with 100% oxygen Intubation Type: IV induction Ventilation: Mask ventilation without difficulty Laryngoscope Size: Mac and 3 Grade View: Grade I Tube size: 8.5 mm Number of attempts: 1 Airway Equipment and Method: Stylet Placement Confirmation: ETT inserted through vocal cords under direct vision,  positive ETCO2 and breath sounds checked- equal and bilateral Secured at: 21 cm Tube secured with: Tape Dental Injury: Teeth and Oropharynx as per pre-operative assessment    Procedure Name: Intubation Date/Time: 05/14/2013 7:52 AM Performed by: Arlice Colt B Laryngoscope Size: Mac and 3 Grade View: Grade I Endobronchial tube: Left, Double lumen EBT, EBT position confirmed by auscultation and EBT position confirmed by fiberoptic bronchoscope and 37 Fr Number of attempts: 1 Airway Equipment and Method: Stylet Placement Confirmation: positive ETCO2 and breath sounds checked- equal and bilateral Tube secured with: Tape Dental Injury: Teeth and Oropharynx as per pre-operative assessment

## 2013-05-14 NOTE — Transfer of Care (Signed)
Immediate Anesthesia Transfer of Care Note  Patient: Alexa [name]  Procedure(s) Performed: Procedure(s): VIDEO BRONCHOSCOPY (N/A) VIDEO ASSISTED THORACOSCOPY (VATS)/ LOBECTOMY (Right)  Patient Location: PACU  Anesthesia Type:General  Level of Consciousness: awake and alert   Airway & Oxygen Therapy: Patient Spontanous Breathing and Patient connected to nasal cannula oxygen  Post-op Assessment: Report given to PACU RN and Post -op Vital signs reviewed and stable  Post vital signs: Reviewed and stable  Complications: No apparent anesthesia complications

## 2013-05-14 NOTE — Brief Op Note (Addendum)
05/14/2013  12:07 PM  PATIENT:  Alexa [name]  77 y.o. 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  PROCEDURE:  Procedure(s):  VIDEO BRONCHOSCOPY (N/A) VIDEO ASSISTED THORACOSCOPY (VATS)/ MINI THORACOTOMY Right Upper lobe wedge resection then completion  -Right Upper Lobectomy -Lymph Node Resection -Insertion of On-Q  SURGEON:  Surgeon(s) and Role:    * Delight Ovens, MD - Primary  PHYSICIAN ASSISTANT: Erin Barrett PA-C  ANESTHESIA:   general  EBL:  Total I/O In: -  Out: 600 [Urine:500; Blood:100]  BLOOD ADMINISTERED:none  DRAINS: 2 Chest tubes right pleural space   LOCAL MEDICATIONS USED:  MARCAINE in On Q    SPECIMEN:  Source of Specimen:  RUL Wedge, Remaining RUL, Lymph Nodes  DISPOSITION OF SPECIMEN:  PATHOLOGY  COUNTS:  YES   DICTATION: .Dragon Dictation  PLAN OF CARE: Admit to inpatient    Wedge resection rt upper lobe, completion lobectomy    PATIENT DISPOSITION:  ICU - extubated and stable.   Delay start of Pharmacological VTE agent (>24hrs) due to surgical blood loss or risk of bleeding: yes

## 2013-05-14 NOTE — Progress Notes (Signed)
S/p RUL  BP 146/67  Pulse 74  Temp(Src) 97.5 F (36.4 C) (Oral)  Resp 21  SpO2 99%   Intake/Output Summary (Last 24 hours) at 05/14/13 1805 Last data filed at 05/14/13 1700  Gross per 24 hour  Intake   1350 ml  Output   1125 ml  Net    225 ml    Minimal drainage, small air leak  Pain well controlled

## 2013-05-15 ENCOUNTER — Encounter (HOSPITAL_COMMUNITY): Payer: Self-pay | Admitting: Cardiothoracic Surgery

## 2013-05-15 ENCOUNTER — Inpatient Hospital Stay (HOSPITAL_COMMUNITY): Payer: Medicare Other

## 2013-05-15 LAB — BASIC METABOLIC PANEL
BUN: 26 mg/dL — ABNORMAL HIGH (ref 6–23)
Calcium: 8.5 mg/dL (ref 8.4–10.5)
GFR calc Af Amer: 71 mL/min — ABNORMAL LOW (ref >90)
GFR calc non Af Amer: 61 mL/min — ABNORMAL LOW (ref >90)
Glucose, Bld: 140 mg/dL — ABNORMAL HIGH (ref 70–99)
Sodium: 136 mEq/L (ref 135–145)

## 2013-05-15 LAB — POCT I-STAT 3, ART BLOOD GAS (G3+)
Acid-Base Excess: 2 mmol/L (ref 0.0–2.0)
Bicarbonate: 27.1 mEq/L — ABNORMAL HIGH (ref 20.0–24.0)
O2 Saturation: 95 %
Patient temperature: 98.6
TCO2: 28 mmol/L (ref 0–100)
pCO2 arterial: 43.2 mmHg (ref 35.0–45.0)
pH, Arterial: 7.406 (ref 7.350–7.450)
pO2, Arterial: 76 mmHg — ABNORMAL LOW (ref 80.0–100.0)

## 2013-05-15 LAB — CBC
HCT: 35.4 % — ABNORMAL LOW (ref 36.0–46.0)
Hemoglobin: 11.9 g/dL — ABNORMAL LOW (ref 12.0–15.0)
MCH: 28.7 pg (ref 26.0–34.0)
MCHC: 33.6 g/dL (ref 30.0–36.0)
RDW: 13.3 % (ref 11.5–15.5)

## 2013-05-15 MED ORDER — ENOXAPARIN SODIUM 30 MG/0.3ML ~~LOC~~ SOLN
30.0000 mg | SUBCUTANEOUS | Status: DC
  Administered 2013-05-15 – 2013-05-21 (×7): 30 mg via SUBCUTANEOUS
  Filled 2013-05-15 (×9): qty 0.3

## 2013-05-15 MED ORDER — POTASSIUM CHLORIDE 10 MEQ/50ML IV SOLN
10.0000 meq | INTRAVENOUS | Status: AC | PRN
  Administered 2013-05-15 (×4): 10 meq via INTRAVENOUS

## 2013-05-15 NOTE — Op Note (Signed)
Alexa [name], Alexa [name]              ACCOUNT NO.:  [id number]  MEDICAL RECORD NO.:  [id number]  LOCATION:  2302                         FACILITY:  [facility]  PHYSICIAN:  Sheliah Plane, MD    DATE OF BIRTH:  05/25/1936  DATE OF PROCEDURE:  05/14/2013 DATE OF DISCHARGE:                              OPERATIVE REPORT   PREOPERATIVE DIAGNOSIS:  Right upper lobe lung mass.  POSTOPERATIVE DIAGNOSIS:  Right upper lobe lung mass with frozen section evidence of adenocarcinoma.  PROCEDURE PERFORMED:  Bronchoscopy, video-assisted thoracoscopy, mini thoracotomy, wedge resection of right upper lobe, completion right upper lobectomy with lymph node dissection and placement of On-Q device.  SURGEON:  Sheliah Plane, MD  FIRST ASSISTANT:  __________  BRIEF HISTORY:  The patient is a 77 year old female who has been followed for approximately 3 years with a right lung mass.  She has a previous history of breast cancer.  In 2011, Dr. Arva Chafe had done ENB biopsy of the mass in the right lung, which had been present for several years.  This biopsy was negative.  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 Initially with the mass remaining stable, but the most recent scan showed a slight increase in size and surgical resection was recommended. The patient agreed and signed informed consent.  DESCRIPTION OF PROCEDURE:  After appropriate time out, the patient underwent general endotracheal anesthesia without incident.  A single- lumen endotracheal tube was placed initially.  Through this, a fiberoptic bronchoscope was passed at the subsegmental level.  There were no endobronchial lesions appreciated.  The bronchial washings in the right lung were obtained for culture.  The scope was then removed. The patient was turned in lateral decubitus position.  After placement of the double-lumen endotracheal tube with the right side up, a  small VATS port site was created in the midaxillary line at approximately the fifth intercostal space, the chest was inspected, there was no evidence of pleural disease.  A small thoracotomy incision was then made to palpate the lung and locate the mass.  The mass was along the conjunction of the minor and major fissure, but there was no involvement across the fissure into the middle or the lower lobe with the area isolated.  Through an anterior port site, a wedge resection with multiple firings of  purple stapler a wedge resection was performed.  This confirmed adenocarcinoma with tumor still at the staple line.  We then decided to proceed with formal right upper lobectomy through the small incision and port sites were proceeded with isolating the arterial branches to the upper lobe.  A total of 4 upper lobe pulmonary arterial vessels were divided and stapled with the gold-tip vascular stapler.  The superior right pulmonary vein was divided in similar fashion with a stapler.  The bronchus to the upper lobe was isolated and a black stapler was used to divide the bronchus.  Prior to dividing, lung was tested and the middle and lower lobe felt to be isolated.     The minor fissure was completed and the upper lobe was removed.  Multiple lymph nodes were removed including the 4R, 10R, 11R, and 12R  regions.  The bronchial stump was tested, was without air leak. CoSeal was placed along the edges of the fissure. Two chest tubes were left through port sites.  The incision was then closed  the muscle layers with interrupted 0 Vicryl and running 2-0 Vicryl subcutaneous tissue and 3-0 subcuticular stitch.  Dermabond was applied. Prior to closure,  On-Q device was tunneled subpleural along the posterior ribs and the catheter left in place.  Sponge and needle count was reported as correct at completion of procedure.  The patient tolerated the procedure without obvious complication.  Estimated blood  loss was 150 mL.  The patient was awakened and extubated in the operating room and transferred to the recovery room for postoperative care.     Sheliah Plane, MD     EG/MEDQ  D:  05/15/2013  T:  05/15/2013  Job:  [id number]

## 2013-05-15 NOTE — Progress Notes (Signed)
Patient ID: Alexa [name], female   DOB: 1936/02/14, 77 y.o.   MRN: [id number] EVENING ROUNDS NOTE :     [address]       [place] [address]             [phone]                 1 Day Post-Op Procedure(s) (LRB): VIDEO BRONCHOSCOPY (N/A) VIDEO ASSISTED THORACOSCOPY (VATS)/ LOBECTOMY (Right)  Total Length of Stay:  LOS: 1 day  BP 161/66  Pulse 86  Temp(Src) 98.7 F (37.1 C) (Oral)  Resp 19  Wt 191 lb 12.8 oz (87 kg)  BMI 35.07 kg/m2  SpO2 97%  .Intake/Output     06/16 0701 - 06/17 0700 06/17 0701 - 06/18 0700   I.V. (mL/kg) 2379.3 (27.3) 295 (3.4)   IV Piggyback 565 150   Total Intake(mL/kg) 2944.3 (33.8) 445 (5.1)   Urine (mL/kg/hr) 2350 (1.1) 1425 (1.4)   Blood 100 (0)    Chest Tube 420 (0.2) 130 (0.1)   Total Output 2870 1555   Net +74.3 -1110          . dextrose 5 % and 0.45% NaCl 20 mL/hr at 05/15/13 0730     Lab Results  Component Value Date   WBC 9.4 05/15/2013   HGB 11.9* 05/15/2013   HCT 35.4* 05/15/2013   PLT 211 05/15/2013   GLUCOSE 140* 05/15/2013   CHOL 211* 12/11/2012   TRIG 107.0 12/11/2012   HDL 38.80* 12/11/2012   LDLDIRECT 143.6 12/11/2012   LDLCALC 123* 03/02/2012   ALT 13 05/10/2013   AST 19 05/10/2013   NA 136 05/15/2013   K 3.2* 05/15/2013   CL 100 05/15/2013   CREATININE 0.90 05/15/2013   BUN 26* 05/15/2013   CO2 26 05/15/2013   TSH 6.96* 12/04/2012   INR 0.91 05/10/2013   Stable day No air leak Walked around unit  Delight Ovens MD  Beeper [phone] Office [phone] 05/15/2013 7:00 PM

## 2013-05-15 NOTE — Progress Notes (Signed)
Patient ID: Alexa [name], female   DOB: [birth year], 77 y.o.   MRN: [id number] TCTS DAILY ICU PROGRESS NOTE                   [address]            [name] [address]          [phone]   1 Day Post-Op Procedure(s) (LRB): VIDEO BRONCHOSCOPY (N/A) VIDEO ASSISTED THORACOSCOPY (VATS)/ LOBECTOMY (Right)  Total Length of Stay:  LOS: 1 day   Subjective: Up to chair, good respiratory effort, pain control better then last night  Objective: Vital signs in last 24 hours: Temp:  [97.5 F (36.4 C)-98.9 F (37.2 C)] 98.6 F (37 C) (06/17 0400) Pulse Rate:  [47-79] 74 (06/17 0700) Cardiac Rhythm:  [-] Normal sinus rhythm (06/17 0700) Resp:  [12-26] 16 (06/17 0700) BP: (94-156)/(38-82) 154/56 mmHg (06/17 0700) SpO2:  [77 %-100 %] 100 % (06/17 0700) Arterial Line BP: (119-198)/(46-125) 162/125 mmHg (06/17 0700) FiO2 (%):  [99 %-100 %] 99 % (06/17 0540) Weight:  [191 lb 12.8 oz (87 kg)] 191 lb 12.8 oz (87 kg) (06/17 0652)  Filed Weights   05/15/13 0652  Weight: 191 lb 12.8 oz (87 kg)    Weight change:    Hemodynamic parameters for last 24 hours:    Intake/Output from previous day: 06/16 0701 - 06/17 0700 In: 2944.3 [I.V.:2379.3; IV Piggyback:565] Out: 2870 [Urine:2350; Blood:100; Chest Tube:420]  Intake/Output this shift: Total I/O In: 50 [IV Piggyback:50] Out: -   Current Meds: Scheduled Meds: . acetaminophen  1,000 mg Intravenous Q6H  . atorvastatin  20 mg Oral q1800  . bisacodyl  10 mg Oral Daily  . bupivacaine 0.5 % ON-Q pump SINGLE CATH 400 mL  400 mL Other To OR  . fentaNYL   Intravenous Q4H  . furosemide  40 mg Oral BID  . irbesartan  300 mg Oral Daily  . levalbuterol  0.63 mg Nebulization Q6H  . levothyroxine  137 mcg Oral QAC breakfast  . Travoprost (BAK Free)  1 drop Both Eyes QHS   Continuous Infusions: . dextrose 5 % and 0.45% NaCl 100 mL/hr at 05/14/13 1700   PRN Meds:.diphenhydrAMINE, diphenhydrAMINE, donepezil, naloxone,  ondansetron (ZOFRAN) IV, ondansetron (ZOFRAN) IV, oxyCODONE, oxyCODONE-acetaminophen, potassium chloride, potassium chloride, potassium chloride SA, senna-docusate, sodium chloride  General appearance: alert, cooperative and no distress Neurologic: intact Heart: regular rate and rhythm, S1, S2 normal, no murmur, click, rub or gallop Lungs: diminished breath sounds bibasilar Abdomen: soft, non-tender; bowel sounds normal; no masses,  no organomegaly Extremities: extremities normal, atraumatic, no cyanosis or edema and Homans sign is negative, no sign of DVT Wound: very small air leak with cough   Lab Results: CBC: Recent Labs  05/15/13 0342  WBC 9.4  HGB 11.9*  HCT 35.4*  PLT 211   BMET:  Recent Labs  05/15/13 0342  NA 136  K 3.2*  CL 100  CO2 26  GLUCOSE 140*  BUN 26*  CREATININE 0.90  CALCIUM 8.5    PT/INR: No results found for this basename: LABPROT, INR,  in the last 72 hours Radiology: Dg Chest Portable 1 View  05/14/2013   *RADIOLOGY REPORT*  Clinical Data: 77 year old female status post VATS.  Line placement and chest tube.  PORTABLE CHEST - 1 VIEW  Comparison: 05/10/2013 and earlier.  Findings: Portable semi upright AP view 1308 hours.  Right chest tubes x tube.  No pneumothorax identified.  Right IJ  approach central line projects just below the level of the carina, lower SVC level.  Lower lung volumes.  Increased patchy bibasilar opacity most resembles atelectasis.  Cervical ACDF and thoracolumbar fusion hardware grossly stable.  Stable surgical clips along the left chest wall.  Stable cardiac size and mediastinal contours.  IMPRESSION: 1.  Right chest tubes in place.  No pneumothorax identified. 2.  Right IJ central line in place, tip at the lower SVC level. 3.  Lower lung volumes with increased atelectasis.   Original Report Authenticated By: Erskine Speed, M.D.     Assessment/Plan: S/P Procedure(s) (LRB): VIDEO BRONCHOSCOPY (N/A) VIDEO ASSISTED THORACOSCOPY (VATS)/  LOBECTOMY (Right) Mobilize Diuresis Leave chest tube in place  D/c a line  Elias Bordner B 05/15/2013 7:30 AM

## 2013-05-16 ENCOUNTER — Inpatient Hospital Stay (HOSPITAL_COMMUNITY): Payer: Medicare Other

## 2013-05-16 LAB — CULTURE, RESPIRATORY W GRAM STAIN: Culture: NO GROWTH

## 2013-05-16 LAB — COMPREHENSIVE METABOLIC PANEL
ALT: 31 U/L (ref 0–35)
Alkaline Phosphatase: 92 U/L (ref 39–117)
CO2: 31 mEq/L (ref 19–32)
Chloride: 99 mEq/L (ref 96–112)
GFR calc Af Amer: 70 mL/min — ABNORMAL LOW (ref >90)
GFR calc non Af Amer: 60 mL/min — ABNORMAL LOW (ref >90)
Glucose, Bld: 126 mg/dL — ABNORMAL HIGH (ref 70–99)
Potassium: 3.2 mEq/L — ABNORMAL LOW (ref 3.5–5.1)
Sodium: 137 mEq/L (ref 135–145)
Total Bilirubin: 1.6 mg/dL — ABNORMAL HIGH (ref 0.3–1.2)

## 2013-05-16 LAB — CBC
HCT: 35.7 % — ABNORMAL LOW (ref 36.0–46.0)
Hemoglobin: 12 g/dL (ref 12.0–15.0)
RBC: 4.15 MIL/uL (ref 3.87–5.11)

## 2013-05-16 MED ORDER — POTASSIUM CHLORIDE 10 MEQ/50ML IV SOLN
INTRAVENOUS | Status: AC
  Filled 2013-05-16: qty 150

## 2013-05-16 MED ORDER — LEVALBUTEROL HCL 0.63 MG/3ML IN NEBU: 0.6300 mg | INHALATION_SOLUTION | Freq: Four times a day (QID) | RESPIRATORY_TRACT | Status: DC | PRN

## 2013-05-16 MED ORDER — POTASSIUM CHLORIDE 10 MEQ/50ML IV SOLN
10.0000 meq | INTRAVENOUS | Status: AC | PRN
  Administered 2013-05-16 (×3): 10 meq via INTRAVENOUS

## 2013-05-16 NOTE — Progress Notes (Signed)
Pt tx from 2300, pt oriented to room, family at [facility]

## 2013-05-16 NOTE — Progress Notes (Signed)
Patient ID: Alexa [name], female   DOB: 13-Jul-1936, 77 y.o.   MRN: [id number] TCTS DAILY ICU PROGRESS NOTE                   [address]            [name] [address]          [phone]   2 Days Post-Op Procedure(s) (LRB): VIDEO BRONCHOSCOPY (N/A) VIDEO ASSISTED THORACOSCOPY (VATS)/ LOBECTOMY (Right)  Total Length of Stay:  LOS: 2 days   Subjective: No complaints today, has chronic back pain  Objective: Vital signs in last 24 hours: Temp:  [98 F (36.7 C)-99.3 F (37.4 C)] 98.4 F (36.9 C) (06/18 0721) Pulse Rate:  [64-109] 74 (06/18 0700) Cardiac Rhythm:  [-] Normal sinus rhythm (06/18 0700) Resp:  [12-26] 16 (06/18 0700) BP: (101-166)/(41-125) 111/48 mmHg (06/18 0700) SpO2:  [89 %-100 %] 94 % (06/18 0700) Arterial Line BP: (132)/(99) 132/99 mmHg (06/17 0800) Weight:  [189 lb 13.1 oz (86.1 kg)] 189 lb 13.1 oz (86.1 kg) (06/18 0240)  Filed Weights   05/15/13 0652 05/16/13 0240  Weight: 191 lb 12.8 oz (87 kg) 189 lb 13.1 oz (86.1 kg)    Weight change: -1 lb 15.7 oz (-0.9 kg)   Hemodynamic parameters for last 24 hours:    Intake/Output from previous day: 06/17 0701 - 06/18 0700 In: 783 [I.V.:583; IV Piggyback:200] Out: 3200 [Urine:3000; Chest Tube:200]  Intake/Output this shift: Total I/O In: 50 [IV Piggyback:50] Out: -   Current Meds: Scheduled Meds: . atorvastatin  20 mg Oral q1800  . bisacodyl  10 mg Oral Daily  . enoxaparin  30 mg Subcutaneous Q24H  . fentaNYL   Intravenous Q4H  . furosemide  40 mg Oral BID  . irbesartan  300 mg Oral Daily  . levalbuterol  0.63 mg Nebulization Q6H  . levothyroxine  137 mcg Oral QAC breakfast  . potassium chloride      . Travoprost (BAK Free)  1 drop Both Eyes QHS   Continuous Infusions: . dextrose 5 % and 0.45% NaCl 20 mL (05/15/13 2041)   PRN Meds:.diphenhydrAMINE, diphenhydrAMINE, donepezil, naloxone, ondansetron (ZOFRAN) IV, ondansetron (ZOFRAN) IV, oxyCODONE-acetaminophen, potassium  chloride, potassium chloride, potassium chloride SA, senna-docusate, sodium chloride  General appearance: alert and cooperative Neurologic: intact Heart: regular rate and rhythm, S1, S2 normal, no murmur, click, rub or gallop and normal apical impulse Lungs: clear to auscultation bilaterally and normal percussion bilaterally Abdomen: soft, non-tender; bowel sounds normal; no masses,  no organomegaly Extremities: extremities normal, atraumatic, no cyanosis or edema and Homans sign is negative, no sign of DVT Wound: no air leak  Lab Results: CBC: Recent Labs  05/15/13 0342 05/16/13 0435  WBC 9.4 11.0*  HGB 11.9* 12.0  HCT 35.4* 35.7*  PLT 211 202   BMET:  Recent Labs  05/15/13 0342 05/16/13 0435  NA 136 137  K 3.2* 3.2*  CL 100 99  CO2 26 31  GLUCOSE 140* 126*  BUN 26* 17  CREATININE 0.90 0.91  CALCIUM 8.5 8.4    PT/INR: No results found for this basename: LABPROT, INR,  in the last 72 hours Radiology: Dg Chest Port 1 View  05/15/2013   *RADIOLOGY REPORT*  Clinical Data: Postop VATS, chest tubes  PORTABLE CHEST - 1 VIEW  Comparison: Prior chest x-ray 06/16 11/2012  Findings: Stable position of a dual right-sided thoracostomy tubes. No definite pneumothorax.  Right IJ approach central venous catheter remains in good  position with the tip in the distal SVC. Stable cardiac and mediastinal contours.  Background pulmonary vascular congestion without overt edema and mild bibasilar atelectasis.  No acute osseous abnormality.  Incompletely imaged lower thoracic stabilization hardware.  Surgical clips project over the left axilla.  IMPRESSION:  1.  Stable and satisfactory support apparatus.  No pneumothorax. 2.  Mild pulmonary vascular congestion and bibasilar atelectasis similar to prior.   Original Report Authenticated By: Malachy Moan, M.D.   Dg Chest Portable 1 View  05/14/2013   *RADIOLOGY REPORT*  Clinical Data: 77 year old female status post VATS.  Line placement and chest  tube.  PORTABLE CHEST - 1 VIEW  Comparison: 05/10/2013 and earlier.  Findings: Portable semi upright AP view 1308 hours.  Right chest tubes x tube.  No pneumothorax identified.  Right IJ approach central line projects just below the level of the carina, lower SVC level.  Lower lung volumes.  Increased patchy bibasilar opacity most resembles atelectasis.  Cervical ACDF and thoracolumbar fusion hardware grossly stable.  Stable surgical clips along the left chest wall.  Stable cardiac size and mediastinal contours.  IMPRESSION: 1.  Right chest tubes in place.  No pneumothorax identified. 2.  Right IJ central line in place, tip at the lower SVC level. 3.  Lower lung volumes with increased atelectasis.   Original Report Authenticated By: Erskine Speed, M.D.     Assessment/Plan: S/P Procedure(s) (LRB): VIDEO BRONCHOSCOPY (N/A) VIDEO ASSISTED THORACOSCOPY (VATS)/ LOBECTOMY (Right) Mobilize Plan for transfer to step-down: see transfer orders Chest tube to water seal Path pending Replace k    Natanael Saladin B 05/16/2013 7:38 AM

## 2013-05-17 ENCOUNTER — Inpatient Hospital Stay (HOSPITAL_COMMUNITY): Payer: Medicare Other

## 2013-05-17 DIAGNOSIS — C3491 Malignant neoplasm of unspecified part of right bronchus or lung: Secondary | ICD-10-CM

## 2013-05-17 DIAGNOSIS — Z902 Acquired absence of lung [part of]: Secondary | ICD-10-CM

## 2013-05-17 LAB — CBC
HCT: 34 % — ABNORMAL LOW (ref 36.0–46.0)
Hemoglobin: 11.3 g/dL — ABNORMAL LOW (ref 12.0–15.0)
MCH: 28.5 pg (ref 26.0–34.0)
MCHC: 33.2 g/dL (ref 30.0–36.0)
MCV: 85.9 fL (ref 78.0–100.0)
Platelets: 209 10*3/uL (ref 150–400)
RBC: 3.96 MIL/uL (ref 3.87–5.11)
RDW: 13.5 % (ref 11.5–15.5)
WBC: 9.5 10*3/uL (ref 4.0–10.5)

## 2013-05-17 LAB — BASIC METABOLIC PANEL
BUN: 21 mg/dL (ref 6–23)
CO2: 32 mEq/L (ref 19–32)
Calcium: 8.9 mg/dL (ref 8.4–10.5)
Chloride: 100 mEq/L (ref 96–112)
Creatinine, Ser: 0.97 mg/dL (ref 0.50–1.10)
GFR calc Af Amer: 65 mL/min — ABNORMAL LOW (ref >90)
GFR calc non Af Amer: 56 mL/min — ABNORMAL LOW (ref >90)
Glucose, Bld: 120 mg/dL — ABNORMAL HIGH (ref 70–99)
Potassium: 3.3 mEq/L — ABNORMAL LOW (ref 3.5–5.1)
Sodium: 141 mEq/L (ref 135–145)

## 2013-05-17 MED ORDER — MORPHINE SULFATE ER 15 MG PO TBCR
30.0000 mg | EXTENDED_RELEASE_TABLET | Freq: Four times a day (QID) | ORAL | Status: DC | PRN
  Administered 2013-05-17 (×2): 30 mg via ORAL
  Administered 2013-05-18 (×2): 15 mg via ORAL
  Administered 2013-05-18 – 2013-05-21 (×10): 30 mg via ORAL
  Filled 2013-05-17 (×2): qty 2
  Filled 2013-05-17: qty 1
  Filled 2013-05-17: qty 2
  Filled 2013-05-17: qty 1
  Filled 2013-05-17 (×9): qty 2
  Filled 2013-05-17: qty 1

## 2013-05-17 NOTE — Discharge Summary (Signed)
Physician Discharge Summary  Patient ID: Alexa [name] MRN: [id number] DOB/AGE: February 22, 1936 77 y.o.  Admit date: 05/14/2013 Discharge date: 05/17/2013  Admission Diagnoses:  Patient Active Problem List   Diagnosis Date Noted  . Non-small cell cancer of right lung 09/15/2011  . Dyspnea 08/09/2011  . Rotator cuff disorder 01/05/2011  . ROTATOR CUFF INJURY, RIGHT SHOULDER 12/15/2010  . LEG PAIN 12/30/2009  . EDEMA 12/30/2009  . NONSPECIFIC ABN FINDNG RAD&OTH EXAM BILARY TRCT 09/01/2009  . PERSONAL HX COLONIC POLYPS 09/01/2009  . GERD 07/29/2009  . BACK PAIN, CHRONIC 05/22/2009  . VITAMIN D DEFICIENCY 02/12/2009  . ANEMIA 02/12/2009  . PERIPHERAL NEUROPATHY 02/12/2009  . ADENOCARCINOMA, BREAST, BILATERAL, HX OF 02/22/2008  . NECK PAIN, RIGHT 02/16/2008  . HYPOTHYROIDISM 08/24/2007  . HYPERLIPIDEMIA 06/06/2007  . HYPERTENSION 06/06/2007  . ARTHRITIS, HX OF 06/06/2007   Discharge Diagnoses:   Patient Active Problem List   Diagnosis Date Noted  . S/P lobectomy of lung 05/17/2013  . Adenocarcinoma of right lung 05/17/2013  . Non-small cell cancer of right lung 09/15/2011  . Dyspnea 08/09/2011  . Rotator cuff disorder 01/05/2011  . ROTATOR CUFF INJURY, RIGHT SHOULDER 12/15/2010  . LEG PAIN 12/30/2009  . EDEMA 12/30/2009  . NONSPECIFIC ABN FINDNG RAD&OTH EXAM BILARY TRCT 09/01/2009  . PERSONAL HX COLONIC POLYPS 09/01/2009  . GERD 07/29/2009  . BACK PAIN, CHRONIC 05/22/2009  . VITAMIN D DEFICIENCY 02/12/2009  . ANEMIA 02/12/2009  . PERIPHERAL NEUROPATHY 02/12/2009  . ADENOCARCINOMA, BREAST, BILATERAL, HX OF 02/22/2008  . NECK PAIN, RIGHT 02/16/2008  . HYPOTHYROIDISM 08/24/2007  . HYPERLIPIDEMIA 06/06/2007  . HYPERTENSION 06/06/2007    ATRIAL FIBRILLATION   . ARTHRITIS, HX OF  06/06/2007   Discharged Condition: good  History of Present Illness:   Alexa [name] is a 77 yo morbidly obese white female with previous history of breast cancer and right sided lung lesion  present since 2009.  The patient underwent ENB biopsy by Dr. Edwyna Shell in 2011 which was negative.  However due to the probability this could be a slow growing bronchial alveolar carcinoma.  She presented for her 1 year follow-up with Dr. Tyrone Sage on 04/26/2013.  CT scan showed an increase in size from previous study of the right sided lesion.  The patient was symptomatic denying increased shortness of breath.  However, due to the growth of the lesion it was felt the patient should undergo surgical resection.  The risks and benefits of the procedure were explained to the patient and she was agreeable to proceed.  Surgery was tentatively scheduled for May 14, 2013.   Hospital Course:   Alexa [name] presented to [facility].  She was taken to the operating room and underwent a Bronchoscopy, a right Video Assisted Thoracoscopy with Mini Thoracotomy.  Wedge resection of the right upper lobe was performed with preliminary frozen showing malignancy.  Therefore, completion Lobectomy of the right upper lobe was completed.  The patient tolerated the procedure well, was extubated and taken to the PACU in stable condition.  The patient did well after surgery.  Her arterial line was discontinued the day after surgery.  Her chest tubes were removed once all air leaks resolved and no pneumothorax was present on CXR.  Her PCA was discontinued and she was placed back on her home regimen of pain medication.  The patient developed Atrial Fibrillation with Rapid Ventricular response.  She was treated with IV Amiodarone and Cardiology consult was obtained.  The patient is doing well.  She  is maintaining sinus rhythm.  We plan discharge home on today's date.    Significant Diagnostic Studies: Pathology  1. Lung, wedge biopsy/resection, Right upper - INVASIVE WELL DIFFERENTIATED ADENOCARCINOMA SPANNING 3.8 CM IN GREATEST DIMENSION. - TUMOR INVADES INTO ELASTIC LAYER OF THE PLEURA. - DEFINITIVE LYMPH/VASCULAR INVASION  IS NOT IDENTIFIED. - STAPLE MARGIN ON INITIAL WEDGE EXCISION IS POSITIVE, PLEASE SEE SPECIMEN # 2. 2. Lung, resection (segmental or lobe), Right upper lobe - BENIGN LUNG PARENCHYMA WITH SCATTERED CHRONIC INFLAMMATION AND INCREASED FOAMY MACROPHAGES. - ONE BENIGN LYMPH NODE (0/1). - NO TUMOR SEEN. 3. Lymph node, biopsy, 12 R - ONE BENIGN LYMPH NODE WITH NO TUMOR SEEN (0/1). 4. Lymph node, biopsy, 4 R - ONE BENIGN LYMPH NODE WITH NO TUMOR SEEN (0/1). 5. Lymph node, biopsy, 10 R - ONE BENIGN LYMPH NODE WITH NO TUMOR SEEN (0/1). 6. Lymph node, biopsy, 11 R - ONE BENIGN LYMPH NODE WITH NO TUMOR SEEN (0/1). 7. Lymph node, biopsy, 11 R #2 - ONE BENIGN LYMPH NODE WITH NO TUMOR SEEN (0/1). 8. Lymph node, biopsy, 10 R #2 - ONE BENIGN LYMPH NODE WITH NO TUMOR SEEN (0/1). 9. Lymph node, biopsy, 4 R #2 - ONE BENIGN LYMPH NODE WITH NO TUMOR SEEN (0/1).  Treatments: surgery:   Bronchoscopy, video-assisted thoracoscopy, mini  thoracotomy, wedge resection of right upper lobe, completion right upper  lobectomy with lymph node dissection and placement of On-Q device.  Disposition: 01-Home or Self Care   Future Appointments Provider Department Dept Phone   04/02/2014 11:30 AM Krista [facility] MEDICAL ONCOLOGY [phone]   04/02/2014 12:00 PM Levert Feinstein, MD [facility] MED[facility]COLOGY [phone]     Follow-up Information   Follow up with GERHARDT,EDWARD B, MD In 2 weeks. (Office will contact you with an appointment)    Contact information:   [address] [place] [address] [phone]       Follow up with Marca Ancona, MD. Schedule an appointment as soon as possible for a visit in 2 weeks. (Follow up with cardiology regarding heart rhythm)    Contact information:   [address] [place] [address] [phone]         Discharge Medications:    Medication List    TAKE  these medications       amiodarone 200 MG tablet  Commonly known as:  PACERONE  Take 1 tablet (200 mg total) by mouth 2 (two) times daily.     donepezil 10 MG tablet  Commonly known as:  ARICEPT  Take 10 mg by mouth at bedtime as needed (memory).     furosemide 40 MG tablet  Commonly known as:  LASIX  Take 40 mg by mouth 2 (two) times daily.     levothyroxine 137 MCG tablet  Commonly known as:  SYNTHROID, LEVOTHROID  Take 137 mcg by mouth daily before breakfast.     morphine 30 MG tablet  Commonly known as:  MSIR  Take 30 mg by mouth 3 (three) times daily as needed for pain.     oxyCODONE-acetaminophen 10-325 MG per tablet  Commonly known as:  PERCOCET  Take 1 tablet by mouth every 4 (four) hours as needed (pain).     potassium chloride SA 20 MEQ tablet  Commonly known as:  K-DUR,KLOR-CON  Take 20 mEq by mouth daily as needed (hypokalemia).     rosuvastatin 10 MG tablet  Commonly  known as:  CRESTOR  Take 10 mg by mouth every other day.     TRAVATAN Z 0.004 % Soln ophthalmic solution  Generic drug:  Travoprost (BAK Free)  Place 1 drop into both eyes at bedtime.     valsartan-hydrochlorothiazide 320-25 MG per tablet  Commonly known as:  DIOVAN-HCT  Take 1 tablet by mouth daily.          SignedLowella Dandy 05/17/2013, 9:48 AM

## 2013-05-17 NOTE — Progress Notes (Signed)
Discontinued Fentanyl pca.  Waisted 17 mcg.   Dorie Rank

## 2013-05-17 NOTE — Progress Notes (Signed)
Utilization review completed.  

## 2013-05-17 NOTE — Progress Notes (Signed)
Patient ID: Alexa [name], female   DOB: 10/09/1936, 77 y.o.   MRN: [id number] TCTS DAILY ICU PROGRESS NOTE                   [address]            [place] [address]          [phone]   3 Days Post-Op Procedure(s) (LRB): VIDEO BRONCHOSCOPY (N/A) VIDEO ASSISTED THORACOSCOPY (VATS)/ LOBECTOMY (Right)  Total Length of Stay:  LOS: 3 days   Subjective: Feels  well  Objective: Vital signs in last 24 hours: Temp:  [97.5 F (36.4 C)-99.3 F (37.4 C)] 98.2 F (36.8 C) (06/19 0743) Pulse Rate:  [83-96] 88 (06/19 0740) Cardiac Rhythm:  [-] Normal sinus rhythm (06/19 0743) Resp:  [10-30] 18 (06/19 0748) BP: (137-150)/(41-92) 145/63 mmHg (06/19 0400) SpO2:  [90 %-99 %] 90 % (06/19 0748) FiO2 (%):  [24 %] 24 % (06/19 0400) Weight:  [191 lb 12.8 oz (87 kg)] 191 lb 12.8 oz (87 kg) (06/18 1613)  Filed Weights   05/15/13 0652 05/16/13 0240 05/16/13 1613  Weight: 191 lb 12.8 oz (87 kg) 189 lb 13.1 oz (86.1 kg) 191 lb 12.8 oz (87 kg)    Weight change: 1 lb 15.7 oz (0.9 kg)   Hemodynamic parameters for last 24 hours:    Intake/Output from previous day: 06/18 0701 - 06/19 0700 In: 1388.5 [P.O.:840; I.V.:448.5; IV Piggyback:100] Out: 1080 [Urine:950; Chest Tube:130]  Intake/Output this shift: Total I/O In: -  Out: 200 [Urine:200]  Current Meds: Scheduled Meds: . atorvastatin  20 mg Oral q1800  . bisacodyl  10 mg Oral Daily  . enoxaparin  30 mg Subcutaneous Q24H  . fentaNYL   Intravenous Q4H  . furosemide  40 mg Oral BID  . irbesartan  300 mg Oral Daily  . levothyroxine  137 mcg Oral QAC breakfast  . Travoprost (BAK Free)  1 drop Both Eyes QHS   Continuous Infusions: . dextrose 5 % and 0.45% NaCl 20 mL/hr at 05/16/13 1104   PRN Meds:.diphenhydrAMINE, diphenhydrAMINE, donepezil, levalbuterol, naloxone, ondansetron (ZOFRAN) IV, ondansetron (ZOFRAN) IV, oxyCODONE-acetaminophen, potassium chloride, potassium chloride SA, senna-docusate, sodium  chloride  General appearance: alert and cooperative Neurologic: intact Lungs: clear to auscultation bilaterally Wound: no air leak  Lab Results: CBC: Recent Labs  05/16/13 0435 05/17/13 0700  WBC 11.0* 9.5  HGB 12.0 11.3*  HCT 35.7* 34.0*  PLT 202 209   BMET:  Recent Labs  05/16/13 0435 05/17/13 0700  NA 137 141  K 3.2* 3.3*  CL 99 100  CO2 31 32  GLUCOSE 126* 120*  BUN 17 21  CREATININE 0.91 0.97  CALCIUM 8.4 8.9    PT/INR: No results found for this basename: LABPROT, INR,  in the last 72 hours Radiology: Dg Chest Port 1 View  05/17/2013   *RADIOLOGY REPORT*  Clinical Data: Postop and check chest tube.  PORTABLE CHEST - 1 VIEW  Comparison: 05/16/2013  Findings: The two right chest tubes are still present.  There may be a tiny right apical pneumothorax.  Right jugular central venous catheter in the SVC region.  Volume loss in the right chest with patchy densities in the right hilum.  Left lung remains clear. Stable appearance of the heart.  IMPRESSION: Stable position of the right chest tubes.  Question a tiny right apical pneumothorax.  Minimal change from the previous exam.   Original Report Authenticated By: Richarda Overlie, M.D.  Dg Chest Port 1 View  05/16/2013   *RADIOLOGY REPORT*  Clinical Data: Post vats  PORTABLE CHEST - 1 VIEW  Comparison: 05/15/2013; 05/14/2013; chest CT - 04/05/2013  Findings:  Grossly unchanged enlarged cardiac silhouette and mediastinal contours with mild tortuosity the thoracic aorta.  Stable position of support apparatus.  No definite pneumothorax.  Mild pulmonary congestion without frank evidence of edema. Stable postsurgical changes of the right lung.  No new focal airspace opacity.  There is persistent mild elevation of the right hemidiaphragm.  Left axillary/lateral breast surgical clips.  Post right-sided rotator cuff repair.  Post lower cervical ACDF and lower thoracic / upper lumbar paraspinal fusion, incompletely evaluated.  IMPRESSION: 1.   Stable positioning of support apparatus.  No pneumothorax. 2.  Stable postsurgical changes right lung with associated volume loss. 3.  Pulmonary venous congestion without frank evidence of edema.   Original Report Authenticated By: Tacey Ruiz, MD     Assessment/Plan: S/P Procedure(s) (LRB): VIDEO BRONCHOSCOPY (N/A) VIDEO ASSISTED THORACOSCOPY (VATS)/ LOBECTOMY (Right) Mobilize Diuresis d/c tubes/lines Path noted and problem list updated TNM code: pT2a, pN0, pMX. Stage IB    Alexa [name] B 05/17/2013 8:43 AM

## 2013-05-18 ENCOUNTER — Inpatient Hospital Stay (HOSPITAL_COMMUNITY): Payer: Medicare Other

## 2013-05-18 LAB — CBC
HCT: 31.8 % — ABNORMAL LOW (ref 36.0–46.0)
Hemoglobin: 10.9 g/dL — ABNORMAL LOW (ref 12.0–15.0)
MCH: 29.3 pg (ref 26.0–34.0)
MCHC: 34.3 g/dL (ref 30.0–36.0)
MCV: 85.5 fL (ref 78.0–100.0)
Platelets: 214 10*3/uL (ref 150–400)
RBC: 3.72 MIL/uL — ABNORMAL LOW (ref 3.87–5.11)
RDW: 13.4 % (ref 11.5–15.5)
WBC: 9 10*3/uL (ref 4.0–10.5)

## 2013-05-18 LAB — BASIC METABOLIC PANEL
BUN: 23 mg/dL (ref 6–23)
CO2: 30 mEq/L (ref 19–32)
Calcium: 8.6 mg/dL (ref 8.4–10.5)
Chloride: 99 mEq/L (ref 96–112)
Creatinine, Ser: 0.99 mg/dL (ref 0.50–1.10)
GFR calc Af Amer: 63 mL/min — ABNORMAL LOW (ref >90)
GFR calc non Af Amer: 54 mL/min — ABNORMAL LOW (ref >90)
Glucose, Bld: 105 mg/dL — ABNORMAL HIGH (ref 70–99)
Potassium: 3.4 mEq/L — ABNORMAL LOW (ref 3.5–5.1)
Sodium: 138 mEq/L (ref 135–145)

## 2013-05-18 MED ORDER — AMIODARONE LOAD VIA INFUSION
150.0000 mg | Freq: Once | INTRAVENOUS | Status: AC
  Administered 2013-05-18: 150 mg via INTRAVENOUS
  Filled 2013-05-18: qty 83.34

## 2013-05-18 MED ORDER — METOPROLOL TARTRATE 1 MG/ML IV SOLN
INTRAVENOUS | Status: AC
  Administered 2013-05-18: 5 mg via INTRAVENOUS
  Filled 2013-05-18: qty 5

## 2013-05-18 MED ORDER — AMIODARONE HCL IN DEXTROSE 360-4.14 MG/200ML-% IV SOLN
60.0000 mg/h | INTRAVENOUS | Status: DC
  Administered 2013-05-18 (×2): 60 mg/h via INTRAVENOUS
  Filled 2013-05-18 (×3): qty 200

## 2013-05-18 MED ORDER — METOPROLOL TARTRATE 1 MG/ML IV SOLN: 5.0000 mg | Freq: Once | INTRAVENOUS | Status: AC

## 2013-05-18 MED ORDER — AMIODARONE HCL IN DEXTROSE 360-4.14 MG/200ML-% IV SOLN
30.0000 mg/h | INTRAVENOUS | Status: DC
  Administered 2013-05-18 (×2): 30 mg/h via INTRAVENOUS
  Filled 2013-05-18 (×4): qty 200

## 2013-05-18 NOTE — Progress Notes (Signed)
Paged Erin PA pt hr remains elevated 120's with amio gtt and bolus, new orders rec'd to give lopressor 5mg  iv. Will continue to monitor.

## 2013-05-18 NOTE — Progress Notes (Signed)
Paged Alexa Barrett PA re HR 150-160's, afib rvr, attempted vagal maneuver unsuccessful, new orders rec'd to start amio gtt. Will continue to monitor.

## 2013-05-18 NOTE — Progress Notes (Addendum)
      [address]       [name] [address]             [phone]    4 Days Post-Op Procedure(s) (LRB): VIDEO BRONCHOSCOPY (N/A) VIDEO ASSISTED THORACOSCOPY (VATS)/ LOBECTOMY (Right)  Subjective:  Alexa [name] complains of some pain.  She states it is worse at night.  She has chronic back pain and has been restarted on her home pain medication regimen.  No BM  Objective: Vital signs in last 24 hours: Temp:  [98.1 F (36.7 C)-99.5 F (37.5 C)] 98.1 F (36.7 C) (06/20 0727) Pulse Rate:  [61-90] 72 (06/20 0500) Cardiac Rhythm:  [-] Normal sinus rhythm (06/19 2043) Resp:  [16-32] 17 (06/20 0500) BP: (113-145)/(30-58) 113/30 mmHg (06/20 0408) SpO2:  [94 %-98 %] 94 % (06/20 0500)  Intake/Output from previous day: 06/19 0701 - 06/20 0700 In: 360 [I.V.:260; IV Piggyback:100] Out: 1890 [Urine:1700; Chest Tube:190]  General appearance: alert, cooperative and no distress Heart: regular rate and rhythm Lungs: clear to auscultation bilaterally Abdomen: soft, non-tender; bowel sounds normal; no masses,  no organomegaly Wound: clean, some serous drainage present likely from around chest tube sites  Lab Results:  Recent Labs  05/17/13 0700 05/18/13 0520  WBC 9.5 9.0  HGB 11.3* 10.9*  HCT 34.0* 31.8*  PLT 209 214   BMET:  Recent Labs  05/17/13 0700 05/18/13 0520  NA 141 138  K 3.3* 3.4*  CL 100 99  CO2 32 30  GLUCOSE 120* 105*  BUN 21 23  CREATININE 0.97 0.99  CALCIUM 8.9 8.6    PT/INR: No results found for this basename: LABPROT, INR,  in the last 72 hours ABG    Component Value Date/Time   PHART 7.406 05/15/2013 0321   HCO3 27.1* 05/15/2013 0321   TCO2 28 05/15/2013 0321   O2SAT 95.0 05/15/2013 0321   CBG (last 3)  No results found for this basename: GLUCAP,  in the last 72 hours  Assessment/Plan: S/P Procedure(s) (LRB): VIDEO BRONCHOSCOPY (N/A) VIDEO ASSISTED THORACOSCOPY (VATS)/ LOBECTOMY (Right)  1. Chest tube- no air leak appreciated  this morning, 190 cc output yesterday- will d/c chest tube repeat CXR in AM 2. Pulm- continue IS 3. + Adenocarcinoma 4. Dispo- patient doing well, will order lactulose for constipation, repeat CXR in AM if no change will d/c home   LOS: 4 days    BARRETT, ERIN 05/18/2013  Episode of  afib with RVR Converted on IV cordarone I have seen and examined Alexa [name] and agree with the above assessment  and plan.  Delight Ovens MD [facility] [phone] Office [phone] 05/18/2013 6:11 PM

## 2013-05-19 ENCOUNTER — Inpatient Hospital Stay (HOSPITAL_COMMUNITY): Payer: Medicare Other

## 2013-05-19 MED ORDER — IRBESARTAN 150 MG PO TABS
150.0000 mg | ORAL_TABLET | Freq: Every day | ORAL | Status: DC
  Administered 2013-05-19 – 2013-05-21 (×3): 150 mg via ORAL
  Filled 2013-05-19 (×3): qty 1

## 2013-05-19 MED ORDER — AMIODARONE HCL 200 MG PO TABS
400.0000 mg | ORAL_TABLET | Freq: Every day | ORAL | Status: DC
  Administered 2013-05-19 – 2013-05-20 (×2): 400 mg via ORAL
  Filled 2013-05-19 (×2): qty 2

## 2013-05-19 NOTE — Progress Notes (Addendum)
      [address]       [name] [address]             [phone]       5 Days Post-Op Procedure(s) (LRB): VIDEO BRONCHOSCOPY (N/A) VIDEO ASSISTED THORACOSCOPY (VATS)/ LOBECTOMY (Right)  Subjective: She is attempting to have a bowel movement this morning.  Objective: Vital signs in last 24 hours: Temp:  [98.5 F (36.9 C)-99.1 F (37.3 C)] 98.6 F (37 C) (06/21 0718) Pulse Rate:  [61-115] 66 (06/21 0718) Cardiac Rhythm:  [-]  Resp:  [13-27] 22 (06/21 0718) BP: (62-114)/(29-69) 109/45 mmHg (06/20 2323) SpO2:  [92 %-100 %] 96 % (06/21 0718)     Intake/Output from previous day: 06/20 0701 - 06/21 0700 In: 3415.1 [P.O.:960; I.V.:2455.1] Out: 700 [Urine:700]   Physical Exam:  Cardiovascular: RRR Pulmonary: Clear to auscultation bilaterally; no rales, wheezes, or rhonchi. Abdomen: Soft, non tender, bowel sounds present. Extremities: Mild bilateral lower extremity edema. Wounds: Clean and dry.  No erythema or signs of infection.   Lab Results: CBC: Recent Labs  05/17/13 0700 05/18/13 0520  WBC 9.5 9.0  HGB 11.3* 10.9*  HCT 34.0* 31.8*  PLT 209 214   BMET:  Recent Labs  05/17/13 0700 05/18/13 0520  NA 141 138  K 3.3* 3.4*  CL 100 99  CO2 32 30  GLUCOSE 120* 105*  BUN 21 23  CREATININE 0.97 0.99  CALCIUM 8.9 8.6    PT/INR: No results found for this basename: LABPROT, INR,  in the last 72 hours ABG:  INR: Will add last result for INR, ABG once components are confirmed Will add last 4 CBG results once components are confirmed  Assessment/Plan:  1. CV - A fib with RVR yesterday. SR with heart rate in the low 100's this am. On Amiodarone gttp, Will  change Amiodarone to po. Also, on Irbesartan 300 daily. Will decrease to 150 as labile BP. She has been seen by Corinda Gubler in past. Was going to be seen yesterday but went into SR. Will manage and have her follow up as needed with cardiology 2.  Pulmonary - Encourage incentive spirometer 3.  Last H and H 10.9 and 31.8 4.Possibly home in am if maintains SR  ZIMMERMAN,DONIELLE MPA-C 05/19/2013,9:29 AM   Patient seen and examined. Agree with above  CXR pending

## 2013-05-20 ENCOUNTER — Inpatient Hospital Stay (HOSPITAL_COMMUNITY): Payer: Medicare Other

## 2013-05-20 DIAGNOSIS — I4891 Unspecified atrial fibrillation: Secondary | ICD-10-CM

## 2013-05-20 DIAGNOSIS — E876 Hypokalemia: Secondary | ICD-10-CM

## 2013-05-20 LAB — BASIC METABOLIC PANEL
CO2: 31 mEq/L (ref 19–32)
Calcium: 9.1 mg/dL (ref 8.4–10.5)
Creatinine, Ser: 1.27 mg/dL — ABNORMAL HIGH (ref 0.50–1.10)
GFR calc Af Amer: 47 mL/min — ABNORMAL LOW (ref >90)

## 2013-05-20 LAB — MAGNESIUM: Magnesium: 1.9 mg/dL (ref 1.5–2.5)

## 2013-05-20 MED ORDER — DEXTROSE 5 % IV SOLN
300.0000 mg | Freq: Once | INTRAVENOUS | Status: AC
  Administered 2013-05-20: 300 mg via INTRAVENOUS
  Filled 2013-05-20: qty 6

## 2013-05-20 MED ORDER — AMIODARONE HCL 200 MG PO TABS
200.0000 mg | ORAL_TABLET | Freq: Two times a day (BID) | ORAL | Status: DC
  Administered 2013-05-20 – 2013-05-21 (×2): 200 mg via ORAL
  Filled 2013-05-20 (×3): qty 1

## 2013-05-20 NOTE — Progress Notes (Addendum)
      [address]       [place] [address]             [phone]       6 Days Post-Op Procedure(s) (LRB): VIDEO BRONCHOSCOPY (N/A) VIDEO ASSISTED THORACOSCOPY (VATS)/ LOBECTOMY (Right)  Subjective: She   Objective: Vital signs in last 24 hours: Temp:  [97.9 F (36.6 C)-99 F (37.2 C)] 98.4 F (36.9 C) (06/22 0750) Pulse Rate:  [36-118] 75 (06/22 0750) Cardiac Rhythm:  [-] Normal sinus rhythm (06/22 0750) Resp:  [13-21] 18 (06/22 0750) BP: (104-123)/(46-56) 104/55 mmHg (06/22 0750) SpO2:  [94 %-100 %] 96 % (06/22 0750)     Intake/Output from previous day: 06/21 0701 - 06/22 0700 In: 786.8 [P.O.:720; I.V.:66.8] Out: 975 [Urine:975]   Physical Exam:  Cardiovascular: RRR Pulmonary: Clear to auscultation on left and slightly diminished at right apex; no rales, wheezes, or rhonchi. Abdomen: Soft, non tender, bowel sounds present. Extremities: Mild bilateral lower extremity edema. Wounds: Clean and dry.  No erythema or signs of infection. A few minor tape blisters on posterior right back.   Lab Results: CBC:  Recent Labs  05/18/13 0520  WBC 9.0  HGB 10.9*  HCT 31.8*  PLT 214   BMET:   Recent Labs  05/18/13 0520  NA 138  K 3.4*  CL 99  CO2 30  GLUCOSE 105*  BUN 23  CREATININE 0.99  CALCIUM 8.6    PT/INR: No results found for this basename: LABPROT, INR,  in the last 72 hours ABG:  INR: Will add last result for INR, ABG once components are confirmed Will add last 4 CBG results once components are confirmed  Assessment/Plan:  1. CV - PAF with RVR yesterday. SR with heart rate in the low 100's this am. On Amiodarone 400 bid po and Irbesartan 150 daily. Not on a BB.Will ask [place] cardiology to see.  2.  Pulmonary - CXR this am appears to show stable right hydropneumothorax.Encourage incentive spirometer 3. Last H and H 10.9 and 31.8  ZIMMERMAN,DONIELLE MPA-C 05/20/2013,8:49 AM  CXR stable Was in and out of a fib yesterday.   Home when rhythm stable

## 2013-05-20 NOTE — Consult Note (Addendum)
Referring Physician: Dorris Fetch  Primary Physician: Primary Cardiologist: Shirlee Latch Reason for Consultation: Post-op AF   HPI:  77 y/o woman with COPD, HTN, PAD, breast CA s/p B mastectomy 2002.  Now POD#5 VATS RU lobectomy for lung CA. On POD #2 developed AF. Was treated with amio and reverted to NSR.  This am had brief run of AF with multiple episodes of SR with frequent PACs.  Denies h/o AF or CAD.  Myoview in 9/12 showed below average exercise tolerance but no evidence for ischemia or infarction. In 11/12, she was found to have a right leg DVT and was on warfarin but stopped in January of this year.   Echo 9/12 EF 60-65%. Mildly dilated RV with mod TR. RVSP 48. LA 3.9 cm  Tele: Currently SR 80-90s.  This am some AF at 105. Also periods of SR with frequent PACs.    Review of Systems:     Cardiac Review of Systems: {Y] = yes [ ]  = no  Chest Pain [    ]  Resting SOB Cove.Etienne   ] Exertional SOB  Cove.Etienne  ]  Kenese.Mounts [  ]   Pedal Edema [   ]    Palpitations [  ] Syncope  [  ]   Presyncope [   ]  General Review of Systems: [Y] = yes [  ]=no Constitional: recent weight change [  ]; anorexia [  ]; fatigue [ y ]; nausea [  ]; night sweats [  ]; fever [  ]; or chills [  ];                                                                                                                                          Dental: poor dentition[  ];   Eye : blurred vision [  ]; diplopia [   ]; vision changes [  ];  Amaurosis fugax[  ]; Resp: cough [  ];  wheezing[  ];  hemoptysis[  ]; shortness of breath {y  ]; paroxysmal nocturnal dyspnea[  ]; dyspnea on exertion[ y ]; or orthopnea[  ];  GI:  gallstones[  ], vomiting[  ];  dysphagia[  ]; melena[  ];  hematochezia [  ]; heartburn[  ];    GU: kidney stones [  ]; hematuria[  ];   dysuria [  ];  nocturia[  ];  history of     obstruction [  ];                 Skin: rash, swelling[  ];, hair loss[  ];  peripheral edema[  ];  or itching[  ]; Musculosketetal: myalgias[   ];  joint swelling[  ];  joint erythema[  ];  joint pain[y  ];  back pain[  ];  Heme/Lymph: bruising[  ];  bleeding[  ];  anemia[  ];  Neuro: TIA[  ];  headaches[  ];  stroke[  ];  vertigo[  ];  seizures[  ];   paresthesias[  ];  difficulty walking[  ];  Psych:depression[  ]; anxiety[  ];  Endocrine: diabetes[  ];  thyroid dysfunction[  ];  Other:  Past Medical History  Diagnosis Date  . Hyperlipidemia   . Hypertension   . Cancer     breast  . GERD (gastroesophageal reflux disease)   . Diverticulosis   . Hx of adenomatous colonic polyps   . DVT (deep venous thrombosis) 09-30-11    right lower leg   . Arthritis   . Hypothyroidism   . Dementia     sees Dr. Vickey Huger  . Peripheral vascular disease 13    leg clot? rx  . History of kidney stones     Medications Prior to Admission  Medication Sig Dispense Refill  . donepezil (ARICEPT) 10 MG tablet Take 10 mg by mouth at bedtime as needed (memory).       . furosemide (LASIX) 40 MG tablet Take 40 mg by mouth 2 (two) times daily.      Marland Kitchen levothyroxine (SYNTHROID, LEVOTHROID) 137 MCG tablet Take 137 mcg by mouth daily before breakfast.      . morphine (MSIR) 30 MG tablet Take 30 mg by mouth 3 (three) times daily as needed for pain.       Marland Kitchen oxyCODONE-acetaminophen (PERCOCET) 10-325 MG per tablet Take 1 tablet by mouth every 4 (four) hours as needed (pain).       . potassium chloride SA (K-DUR,KLOR-CON) 20 MEQ tablet Take 20 mEq by mouth daily as needed (hypokalemia).      . rosuvastatin (CRESTOR) 10 MG tablet Take 10 mg by mouth every other day.      . TRAVATAN Z 0.004 % SOLN ophthalmic solution Place 1 drop into both eyes at bedtime.       . valsartan-hydrochlorothiazide (DIOVAN-HCT) 320-25 MG per tablet Take 1 tablet by mouth daily.         Marland Kitchen amiodarone  400 mg Oral Daily  . atorvastatin  20 mg Oral q1800  . bisacodyl  10 mg Oral Daily  . enoxaparin  30 mg Subcutaneous Q24H  . furosemide  40 mg Oral BID  . irbesartan  150 mg Oral  Daily  . levothyroxine  137 mcg Oral QAC breakfast  . Travoprost (BAK Free)  1 drop Both Eyes QHS    Infusions: . dextrose 5 % and 0.45% NaCl Stopped (05/18/13 1000)    Allergies  Allergen Reactions  . Strawberry Swelling    Swelling ,rash  . Hydrocodone-Homatropine     REACTION: Nausea  . Nsaids   . Penicillins     REACTION: rash  . Simvastatin     REACTION: muscle pain    History   Social History  . Marital Status: Widowed    Spouse Name: N/A    Number of Children: N/A  . Years of Education: N/A   Occupational History  . Not on file.   Social History Main Topics  . Smoking status: Former Smoker -- 1.00 packs/day for 25 years    Types: Cigarettes    Quit date: 05/10/1980  . Smokeless tobacco: Never Used  . Alcohol Use: No  . Drug Use: No  . Sexually Active: No   Other Topics Concern  . Not on file   Social History Narrative  . No narrative on file    Family History  Problem Relation Age of Onset  .  Diabetes Daughter   . Diabetes Daughter   . Heart disease Father   . Heart disease Brother   . Heart disease Sister   . Colon cancer Neg Hx   . Colon polyps Daughter     PHYSICAL EXAM: Filed Vitals:   05/20/13 1221  BP:   Pulse:   Temp: 98.1 F (36.7 C)  Resp:      Intake/Output Summary (Last 24 hours) at 05/20/13 1339 Last data filed at 05/20/13 1200  Gross per 24 hour  Intake    480 ml  Output   1200 ml  Net   -720 ml    General:  Chronically il appearing. No respiratory difficulty HEENT: normal Neck: supple. no JVD. Carotids 2+ bilat; no bruits. No lymphadenopathy or thryomegaly appreciated. Cor: PMI nondisplaced. Regular rate & rhythm. No rubs, gallops or murmurs. B mastectomy Lungs: clear with decreased breath sounds Abdomen: soft, nontender, nondistended. No hepatosplenomegaly. No bruits or masses. Good bowel sounds. Extremities: no cyanosis, clubbing, rash, tr edema. Chronic venous stasis changes Neuro: alert & oriented x 3,  cranial nerves grossly intact. moves all 4 extremities w/o difficulty. Affect pleasant.  ECG: 05/10/13: NSR 72 No ST-T wave abnormalities.    No results found for this or any previous visit (from the past 24 hour(s)). Dg Chest 2 View  05/20/2013   *RADIOLOGY REPORT*  Clinical Data: Follow up pneumothorax  CHEST - 2 VIEW  Comparison: 05/19/2013  Findings: Right-sided loculated right apical pneumothorax is again noted and appears unchanged from previous exam.  The heart size appears normal.  No pleural effusion or edema identified.  Heart size and mediastinal contours are stable.  There is a right IJ catheter with tip in the projection of the SVC.  IMPRESSION:  1.  No change in loculated right apical pneumothorax.   Original Report Authenticated By: Signa Kell, M.D.   Dg Chest 2 View  05/19/2013   *RADIOLOGY REPORT*  Clinical Data: Status post VATS procedure with removal of right upper lobe lung carcinoma.  CHEST - 2 VIEW  Comparison: 05/18/2013  Findings: The right chest tube has been removed.  There is a small loculated hydropneumothorax at the right apex.  No pulmonary edema or consolidation is identified.  Heart size and mediastinal contours are stable.  There is stable positioning of a central line in the SVC.  IMPRESSION: Small loculated right apical hydropneumothorax after chest tube removal.   Original Report Authenticated By: Irish Lack, M.D.     ASSESSMENT: 1. Post-op AF      --CHADS2 (HTN, age)  2. RUL lung CA s/p VATS resection 6/16 3. PAD 4. HTN 5. Hypokalemia  PLAN/DISCUSSION:  She has post-op AF which is relatively well controlled on oral amio. Will supplement with an IV bolus today. HR in AF is not that fast so even if she has occasional AF today, I think she can still go home tomorrow (on amio 200 bid) unless rate is fast > 120 at rest or on minimal exertion.  Can likely stop amio after 1 month. Her CHADS2 score is 2 so we should consider anticoagulation but given recent  chest surgery may be best to hold off. I will defer this decision to Dr. Tyrone Sage.   She had significant hypokalemia on 6/19 & 6/20. Will recheck K and Mag today. Supplement to keep K >= 4.0 and Mg >= 2.0. Kamira Mellette,MD 2:18 PM

## 2013-05-21 ENCOUNTER — Other Ambulatory Visit: Payer: Self-pay | Admitting: Oncology

## 2013-05-21 ENCOUNTER — Inpatient Hospital Stay (HOSPITAL_COMMUNITY): Payer: Medicare Other

## 2013-05-21 LAB — BASIC METABOLIC PANEL
BUN: 31 mg/dL — ABNORMAL HIGH (ref 6–23)
CO2: 30 mEq/L (ref 19–32)
Calcium: 9 mg/dL (ref 8.4–10.5)
Creatinine, Ser: 1.15 mg/dL — ABNORMAL HIGH (ref 0.50–1.10)

## 2013-05-21 MED ORDER — AMIODARONE HCL 200 MG PO TABS: 200.0000 mg | ORAL_TABLET | Freq: Two times a day (BID) | ORAL | Status: DC

## 2013-05-21 MED ORDER — POTASSIUM CHLORIDE CRYS ER 20 MEQ PO TBCR
40.0000 meq | EXTENDED_RELEASE_TABLET | Freq: Once | ORAL | Status: AC
  Administered 2013-05-21: 40 meq via ORAL
  Filled 2013-05-21: qty 2

## 2013-05-21 NOTE — Progress Notes (Signed)
Discharge instructions given to patient and daughter with [facility]back.  Questions asked and answered. No complaints, prescriptions given to daughter.

## 2013-05-21 NOTE — Care Management Note (Signed)
    Page 1 of 1   05/21/2013     2:11:29 PM   CARE MANAGEMENT NOTE 05/21/2013  Patient:  Alexa [name], Alexa [name]   Account Number:  [id number]  Date Initiated:  05/17/2013  Documentation initiated by:  Donn Pierini  Subjective/Objective Assessment:   Pt admitted s/p VATS     Action/Plan:   PTA pt lived at home with family- anticipate return home when medically stable- NCM to follow for potential needs   Anticipated DC Date:  05/20/2013   Anticipated DC Plan:  HOME/SELF CARE      DC Planning Services  CM consult      Choice offered to / List presented to:             Status of service:  Completed, signed off Medicare Important Message given?   (If response is "NO", the following Medicare IM given date fields will be blank) Date Medicare IM given:   Date Additional Medicare IM given:    Discharge Disposition:  HOME/SELF CARE  Per UR Regulation:  Reviewed for med. necessity/level of care/duration of stay  If discussed at Long Length of Stay Meetings, dates discussed:    Comments:  05/18/13- 1350- Donn Pierini RN, BSN [phone] Pt s/p VATS-  all chest tubes have been removed - pt went into afib and now on amio gtt- d/c plans remain to return home when medically stable- NCM to follow

## 2013-05-21 NOTE — Progress Notes (Signed)
Addendum. Medicated with 2 percocet at 1000, not scanned on MAR. Pain 8 on scale.

## 2013-05-21 NOTE — Progress Notes (Addendum)
       [address]       [place]  [address]             [phone]          7 Days Post-Op Procedure(s) (LRB): VIDEO BRONCHOSCOPY (N/A) VIDEO ASSISTED THORACOSCOPY (VATS)/ LOBECTOMY (Right)  Subjective: Feeling well, no complaints.  Wants to go home.  No further AF.   Objective: Vital signs in last 24 hours: Patient Vitals for the past 24 hrs:  BP Temp Temp src Pulse Resp SpO2  05/21/13 0755 - 98.1 F (36.7 C) Oral - - -  05/21/13 0320 120/47 mmHg 97.9 F (36.6 C) Oral 69 29 98 %  05/20/13 2316 - 99.4 F (37.4 C) Oral - - -  05/20/13 2315 105/37 mmHg - - 72 16 98 %  05/20/13 1955 125/68 mmHg - - 71 27 99 %  05/20/13 1954 - 97.9 F (36.6 C) Oral - - -  05/20/13 1742 98/58 mmHg - - 78 21 99 %  05/20/13 1715 95/48 mmHg - - 77 23 96 %  05/20/13 1700 87/35 mmHg - - 71 17 95 %  05/20/13 1650 98/42 mmHg - - 75 29 95 %  05/20/13 1630 103/70 mmHg - - 76 18 98 %  05/20/13 1615 115/52 mmHg - - 74 20 98 %  05/20/13 1531 - 98.8 F (37.1 C) Oral - - -  05/20/13 1520 101/50 mmHg - - 75 19 97 %  05/20/13 1221 - 98.1 F (36.7 C) Oral - - -  05/20/13 1130 107/40 mmHg - - 87 22 98 %   Current Weight  05/16/13 191 lb 12.8 oz (87 kg)     Intake/Output from previous day: 06/22 0701 - 06/23 0700 In: 600 [P.O.:600] Out: 1450 [Urine:1450]    PHYSICAL EXAM:  Heart: RRR Lungs: Clear Wound: Dressed and dry    Lab Results: CBC:No results found for this basename: WBC, HGB, HCT, PLT,  in the last 72 hours BMET:  Recent Labs  05/20/13 1450  NA 140  K 3.1*  CL 97  CO2 31  GLUCOSE 146*  BUN 36*  CREATININE 1.27*  CALCIUM 9.1    PT/INR: No results found for this basename: LABPROT, INR,  in the last 72 hours  CXR: Grossly unchanged cardiac silhouette and mediastinal contours. Stable positioning of support apparatus. Grossly unchanged appearance of presumed loculated hydropneumothorax within the right upper lung. Grossly stable postsurgical change of  the right hilum with associated right-sided volume loss. No new focal airspace opacities. Surgical clips again overlying the left axilla/superior  lateral aspect of the left breast. Grossly unchanged bones. Post cholecystectomy.  IMPRESSION:  Grossly unchanged appearance of presumed loculated  hydropneumothorax within the right upper thorax.   Assessment/Plan: S/P Procedure(s) (LRB): VIDEO BRONCHOSCOPY (N/A) VIDEO ASSISTED THORACOSCOPY (VATS)/ LOBECTOMY (Right) Stable from thoracic standpoint. No further arrhythmias. Continue Amio.  Appreciate Dr. Prescott Gum assistance.   Hypokalemia- recheck K+ this am. Will discuss with MD- possible d/c home later today if no recurrence of AF.    LOS: 7 days    Alexa [name] H 05/21/2013  ADDENDUM: Rhythm stable.  K= 3.2.  Supplemental K given.  Plan home today- instructions reviewed with patient.

## 2013-05-22 ENCOUNTER — Telehealth: Payer: Self-pay | Admitting: Oncology

## 2013-05-22 NOTE — Telephone Encounter (Signed)
Talked to daughter, gave her appt for 7/15 MD visit only

## 2013-05-24 ENCOUNTER — Other Ambulatory Visit: Payer: Self-pay | Admitting: *Deleted

## 2013-05-24 DIAGNOSIS — R911 Solitary pulmonary nodule: Secondary | ICD-10-CM

## 2013-05-25 ENCOUNTER — Telehealth: Payer: Self-pay | Admitting: *Deleted

## 2013-05-25 NOTE — Telephone Encounter (Signed)
I called patient to check in following surgery.  Patient reports that she is doing fair.  She denies having much pain but says that she is weak.  She does say that each day shows a little improvement.  She reports that she is eating and drinking well and that her daughters are assisting her.  She denies any questions or concerns at this time.  We reviewed her scheduled appointments and she was able to verbalize the schedule.  I encouraged her to call for any needs.

## 2013-05-28 ENCOUNTER — Ambulatory Visit: Payer: Self-pay | Admitting: Surgical

## 2013-05-28 ENCOUNTER — Ambulatory Visit
Admission: RE | Admit: 2013-05-28 | Discharge: 2013-05-28 | Disposition: A | Discharge status: Home / Self Care | Payer: Medicare Other | Source: Ambulatory Visit | Attending: Cardiothoracic Surgery | Admitting: Cardiothoracic Surgery

## 2013-05-28 DIAGNOSIS — R911 Solitary pulmonary nodule: Secondary | ICD-10-CM

## 2013-05-28 DIAGNOSIS — D381 Neoplasm of uncertain behavior of trachea, bronchus and lung: Secondary | ICD-10-CM

## 2013-05-28 DIAGNOSIS — Z902 Acquired absence of lung [part of]: Secondary | ICD-10-CM

## 2013-05-28 NOTE — Progress Notes (Signed)
[address]       [place] [address]             [phone]                  Alexa [name] [facility] Medical Record #[id number] Date of Birth: Dec 12, 1935  Alexa Feinstein, MD Alexa Salisbury, MD  Chief Complaint:   PostOp Follow Up Visit   History of Present Illness:    The patient is seen in one week followup post discharge following right upper lobectomy for invasive well-differentiated adenocarcinoma. She is reporting that she is making steady progress although there is some shortness of breath and mild sputum production. She continues to work aggressively with her incentive spirometry. She denies fevers chills or other constitutional symptoms. She is scheduled to see Dr. Cyndie [name] on the fifteenth of this month for oncology followup.           History  Smoking status  . Former Smoker -- 1.00 packs/day for 25 years  . Types: Cigarettes  . Quit date: 05/10/1980  Smokeless tobacco  . Never Used       Allergies  Allergen Reactions  . Strawberry Swelling    Swelling ,rash  . Hydrocodone-Homatropine     REACTION: Nausea  . Nsaids   . Penicillins     REACTION: rash  . Simvastatin     REACTION: muscle pain    Current Outpatient Prescriptions  Medication Sig Dispense Refill  . amiodarone (PACERONE) 200 MG tablet Take 1 tablet (200 mg total) by mouth 2 (two) times daily.  60 tablet  2  . donepezil (ARICEPT) 10 MG tablet Take 10 mg by mouth at bedtime as needed (memory).       . furosemide (LASIX) 40 MG tablet Take 40 mg by mouth 2 (two) times daily.      Marland Kitchen levothyroxine (SYNTHROID, LEVOTHROID) 137 MCG tablet Take 137 mcg by mouth daily before breakfast.      . morphine (MSIR) 30 MG tablet Take 30 mg by mouth 3 (three) times daily as needed for pain.       Marland Kitchen oxyCODONE-acetaminophen (PERCOCET) 10-325 MG per tablet Take 1 tablet by mouth every 4 (four) hours as needed (pain).       . potassium chloride SA (K-DUR,KLOR-CON) 20 MEQ  tablet Take 20 mEq by mouth daily as needed (hypokalemia).      . rosuvastatin (CRESTOR) 10 MG tablet Take 10 mg by mouth every other day.      . TRAVATAN Z 0.004 % SOLN ophthalmic solution Place 1 drop into both eyes at bedtime.       . valsartan-hydrochlorothiazide (DIOVAN-HCT) 320-25 MG per tablet Take 1 tablet by mouth daily.       No current facility-administered medications for this visit.       Physical Exam: BP 136/70  Pulse 75  Resp 16  Ht 5\' 3"  (1.6 m)  Wt 183 lb (83.008 kg)  BMI 32.43 kg/m2  SpO2 94%  General appearance: alert, cooperative and no distress Heart: regular rate and rhythm Lungs: clear to auscultation bilaterally Extremities: venous stasis dermatitis noted Wound: Incisions healing well without evidence of infection. There is some minor irritations related to where the tape was. Wounds:  Diagnostic Studies & Laboratory data:         Recent Radiology Findings: Dg Chest 2 View  05/28/2013   *RADIOLOGY REPORT*  Clinical Data: VATS, status post lobectomy  CHEST -  2 VIEW  Comparison: 05/21/2013  Findings: Cardiomediastinal silhouette is stable.  Metallic fixation plate cervical spine again noted.  Surgical clips in the left axilla.  Stable metallic fixation of the lower thoracic and lumbar spine.  Loculated hydropneumothorax in the right upper hemithorax is stable.  Right volume loss again noted.  Post cholecystectomy surgical clips.  Stable postsurgical changes right hilar and right perihilar region.  Left lung is clear.  No pulmonary edema.  IMPRESSION: No significant change.  Again noted hydropneumothorax in the right upper hemithorax. Stable right volume loss and postsurgical changes.   Original Report Authenticated By: Alexa [name], M.D.      Recent Labs: Lab Results  Component Value Date   WBC 9.0 05/18/2013   HGB 10.9* 05/18/2013   HCT 31.8* 05/18/2013   PLT 214 05/18/2013   GLUCOSE 107* 05/21/2013   CHOL 211* 12/11/2012   TRIG 107.0 12/11/2012   HDL 38.80*  12/11/2012   LDLDIRECT 143.6 12/11/2012   LDLCALC 123* 03/02/2012   ALT 31 05/16/2013   AST 36 05/16/2013   NA 138 05/21/2013   K 3.2* 05/21/2013   CL 97 05/21/2013   CREATININE 1.15* 05/21/2013   BUN 31* 05/21/2013   CO2 30 05/21/2013   TSH 6.96* 12/04/2012   INR 0.91 05/10/2013      Assessment / Plan:  Steady progress. She will follow up with oncology in 2 weeks to determine her she needs any adjuvant therapies. We will see her in 3 weeks for followup with a chest x-ray to reassess her progression and surgical recovery.          Alexa [name] E 05/28/2013 3:23 PM

## 2013-06-05 ENCOUNTER — Other Ambulatory Visit: Payer: Self-pay

## 2013-06-05 MED ORDER — DONEPEZIL HCL 10 MG PO TABS: 10.0000 mg | ORAL_TABLET | Freq: Every day | ORAL | Status: DC

## 2013-06-12 ENCOUNTER — Ambulatory Visit: Payer: Medicare Other | Admitting: Oncology

## 2013-06-12 ENCOUNTER — Telehealth: Payer: Self-pay | Admitting: *Deleted

## 2013-06-12 NOTE — Telephone Encounter (Signed)
Patient scheduled to see Dr. Cyndie Chime at 4:30 PM and did not show for appointment.  I called to check on patient and spoke with her daughter who told me that her mother forgot because she is with her at [facility] where Ms. Wienke's daughter is the patient and being seen.  After checking with Dr. Patsy Lager nurse, I called Ms. Barretta's daughter and instructed her to have her mother call tomorrow and reschedule her appointment.

## 2013-06-14 ENCOUNTER — Telehealth: Payer: Self-pay | Admitting: *Deleted

## 2013-06-14 NOTE — Telephone Encounter (Signed)
I called to f/u with patient to reschedule her appointment with Dr. Cyndie Chime.  Patient not at home so I spoke with her daughter who shared that she and her sister live with Ms. Steinkamp and all three of them have been having major medical issues.  Her sister is in liver failure awaiting transplant and Ms. Cilia had gone with her to an MD appointment.  Patient's daughter reported that Ms. Holmes has been under a lot of stress with their illnesses and that she had noticed that she was experiencing some SOB upon exertion.  Patient's upcoming appointment schedule reviewed and patient's daughter was encouraged to assist her mother to reschedule her appointment with Dr. Cyndie Chime.

## 2013-06-17 ENCOUNTER — Encounter: Payer: Self-pay | Admitting: Oncology

## 2013-06-17 NOTE — Progress Notes (Signed)
77 year old woman following her for many years for breast cancer in remission who recently was found to have a lesion in her lung and underwent surgery on 05/14/2013 with findings of a 3.8 cm, well differentiated, adenocarcinoma of the lung with multiple lymph nodes negative. Pathologic stage IB,  T2a N0M0. She was doing today for her first post hospital visit to discuss adjuvant treatment. Her daughter became quite ill and is also hospitalized at this time and she called to reschedule today's appointment.

## 2013-06-20 ENCOUNTER — Telehealth: Payer: Self-pay | Admitting: *Deleted

## 2013-06-20 ENCOUNTER — Other Ambulatory Visit: Payer: Self-pay | Admitting: *Deleted

## 2013-06-20 DIAGNOSIS — J90 Pleural effusion, not elsewhere classified: Secondary | ICD-10-CM

## 2013-06-20 NOTE — Telephone Encounter (Signed)
I called patient to remind her of her appointment with Dr. Tyrone Sage tomorrow and to check in.  Alexa [name] reports that Alexa [name] is doing OK.  Alexa [name] has been very busy assisting with her daughter's illness.  Alexa [name] denied any other needs at this time.

## 2013-06-21 ENCOUNTER — Telehealth: Payer: Self-pay | Admitting: *Deleted

## 2013-06-21 ENCOUNTER — Ambulatory Visit (INDEPENDENT_AMBULATORY_CARE_PROVIDER_SITE_OTHER): Payer: Self-pay | Admitting: Cardiothoracic Surgery

## 2013-06-21 ENCOUNTER — Other Ambulatory Visit: Payer: Self-pay | Admitting: Oncology

## 2013-06-21 ENCOUNTER — Encounter: Payer: Self-pay | Admitting: Cardiothoracic Surgery

## 2013-06-21 ENCOUNTER — Ambulatory Visit
Admission: RE | Admit: 2013-06-21 | Discharge: 2013-06-21 | Disposition: A | Discharge status: Home / Self Care | Payer: Medicare Other | Source: Ambulatory Visit | Attending: Cardiothoracic Surgery | Admitting: Cardiothoracic Surgery

## 2013-06-21 DIAGNOSIS — J90 Pleural effusion, not elsewhere classified: Secondary | ICD-10-CM

## 2013-06-21 DIAGNOSIS — C3410 Malignant neoplasm of upper lobe, unspecified bronchus or lung: Secondary | ICD-10-CM

## 2013-06-21 DIAGNOSIS — Z902 Acquired absence of lung [part of]: Secondary | ICD-10-CM

## 2013-06-21 DIAGNOSIS — Z9889 Other specified postprocedural states: Secondary | ICD-10-CM

## 2013-06-21 NOTE — Telephone Encounter (Signed)
Received call from daughter, Misty Stanley stating that they had seen Dr Sena Hitch & was told that they need to see Dr Cyndie Chime soon.  Note to Dr Cyndie Chime.

## 2013-06-21 NOTE — Progress Notes (Signed)
[address]       [place] [address]             [phone]                  Alexa [name] [facility] Medical Record #[id number] Date of Birth: [birth year]  Alexa [name], Alexa [name] Alexa [name], Alexa [name]  Chief Complaint:   PostOp Follow Up Visit 05/14/2013 PREOPERATIVE DIAGNOSIS: Right upper lobe lung mass.  POSTOPERATIVE DIAGNOSIS: Right upper lobe lung mass with frozen section  evidence of adenocarcinoma.  PROCEDURE PERFORMED: Bronchoscopy, video-assisted thoracoscopy, mini  thoracotomy, wedge resection of right upper lobe, completion right upper  lobectomy with lymph node dissection and placement of On-Q device.    TNM code: pT2a, pN0, pMX. INVASIVE WELL DIFFERENTIATED ADENOCARCINOMA SPANNING 3.8 CM   Stage IB  History of Present Illness:      Some shortness of breath with exertion but overall the patient has remained functional at home, his travel to [place], and has been caring for her daughter who went into liver failure.  She's had no fever chills no hemoptysis.   She missed her appointment with oncology.       History  Smoking status  . Former Smoker -- 1.00 packs/day for 25 years  . Types: Cigarettes  . Quit date: 05/10/1980  Smokeless tobacco  . Never Used       Allergies  Allergen Reactions  . Strawberry Swelling    Swelling ,rash  . Hydrocodone-Homatropine     REACTION: Nausea  . Nsaids   . Penicillins     REACTION: rash  . Simvastatin     REACTION: muscle pain    Current Outpatient Prescriptions  Medication Sig Dispense Refill  . donepezil (ARICEPT) 10 MG tablet Take 1 tablet (10 mg total) by mouth at bedtime.  90 tablet  0  . furosemide (LASIX) 40 MG tablet Take 40 mg by mouth 2 (two) times daily.      Marland Kitchen levothyroxine (SYNTHROID, LEVOTHROID) 137 MCG tablet Take 137 mcg by mouth daily before breakfast.      . morphine (MSIR) 30 MG tablet Take 30 mg by mouth 3 (three) times daily as needed for pain.       Marland Kitchen  oxyCODONE-acetaminophen (PERCOCET) 10-325 MG per tablet Take 1 tablet by mouth every 4 (four) hours as needed (pain).       . potassium chloride SA (K-DUR,KLOR-CON) 20 MEQ tablet Take 20 mEq by mouth daily as needed (hypokalemia).      . rosuvastatin (CRESTOR) 10 MG tablet Take 10 mg by mouth every other day.      . TRAVATAN Z 0.004 % SOLN ophthalmic solution Place 1 drop into both eyes at bedtime.       . valsartan-hydrochlorothiazide (DIOVAN-HCT) 320-25 MG per tablet Take 1 tablet by mouth daily.      Marland Kitchen amiodarone (PACERONE) 200 MG tablet Take 1 tablet (200 mg total) by mouth 2 (two) times daily.  60 tablet  2   No current facility-administered medications for this visit.       Physical Exam: BP 122/67  Pulse 72  Resp 16  Ht 5\' 3"  (1.6 m)  Wt 184 lb (83.462 kg)  BMI 32.6 kg/m2  SpO2 96%  General appearance: alert and cooperative Neurologic: intact Heart: regular rate and rhythm, S1, S2 normal, no murmur, click, rub or gallop Lungs: clear to auscultation bilaterally Abdomen: soft, non-tender; bowel sounds normal; no masses,  no organomegaly Extremities: edema chronic leg swelling left more then rt Wound: well healed   Diagnostic Studies & Laboratory data:         Recent Radiology Findings: Dg Chest 2 View  06/21/2013   *RADIOLOGY REPORT*  Clinical Data: Follow up pleural effusion , shortness of breath  CHEST - 2 VIEW  Comparison: Chest x-ray of 05/28/2013 and 05/21/2013  Findings: There has been a decrease in volume of the right apical hydropneumothorax with persistent volume loss on the right.  The left lung is clear.  Mild cardiomegaly is stable.  Hardware for posterior fusion of the lower thoracic and upper lumbar spine is noted with anterior fusion of the lower and mid cervical spine.  IMPRESSION: Decrease in size of the loculated right apical hydropneumothorax with volume loss remaining on the right.   Original Report Authenticated By: Dwyane Dee, M.D.      Recent  Labs: Lab Results  Component Value Date   WBC 9.0 05/18/2013   HGB 10.9* 05/18/2013   HCT 31.8* 05/18/2013   PLT 214 05/18/2013   GLUCOSE 107* 05/21/2013   CHOL 211* 12/11/2012   TRIG 107.0 12/11/2012   HDL 38.80* 12/11/2012   LDLDIRECT 143.6 12/11/2012   LDLCALC 123* 03/02/2012   ALT 31 05/16/2013   AST 36 05/16/2013   NA 138 05/21/2013   K 3.2* 05/21/2013   CL 97 05/21/2013   CREATININE 1.15* 05/21/2013   BUN 31* 05/21/2013   CO2 30 05/21/2013   TSH 6.96* 12/04/2012   INR 0.91 05/10/2013      Assessment / Plan:      Return in 3 months with chest xray PA and Lat followup for stage IB adenocarcinoma the lung Follow up with oncology to consider chemo. Hypothyroid, TSH 6.96 12/04/2012  To see Dr Clent Ridges in follow up for thyroid medications      [name] 06/21/2013 10:23 AM

## 2013-06-21 NOTE — Patient Instructions (Signed)
Check with Dr. Clent Ridges about thyroid medication

## 2013-06-22 ENCOUNTER — Telehealth: Payer: Self-pay | Admitting: Oncology

## 2013-06-22 NOTE — Telephone Encounter (Signed)
lvm for pt regarding to 7.29.14 appt

## 2013-06-26 ENCOUNTER — Ambulatory Visit: Payer: Medicare Other | Admitting: Oncology

## 2013-06-26 ENCOUNTER — Encounter: Payer: Self-pay | Admitting: Oncology

## 2013-06-26 NOTE — Progress Notes (Signed)
77 year old woman followed here for breast cancer who recently developed a second primary adenocarcinoma of the lung, stage IB, treated with surgical resection on 05/14/2013. Tumor was well differentiated. Multiple mediastinal lymph nodes negative. Maximum dimension 3.8 cm. She is scheduled to see me in the office for followup but canceled since one of her daughters was very ill. Another daughter call to reschedule the appointment and we were to run on her schedule today but once again she failed to report. There is really no urgency in this visit since I will not be planning to give her chemotherapy for this early stage well-differentiated lesion.

## 2013-06-28 ENCOUNTER — Telehealth: Payer: Self-pay | Admitting: *Deleted

## 2013-06-28 NOTE — Telephone Encounter (Signed)
I called patient to f/u due to missed appointment with Dr. Cyndie Chime on 06/26/13.  I spoke to Lincoln, patient's daughter who said that they were confused because they were told that she did not need to come back for a year and then they are called about a missed appointment.  Patient's daughter said she would have her mother call me to discuss.  Patient continues to be very involved in the care of her daughter who is in liver failure.  Will f/u in a few days if I do not hear from patient.

## 2013-06-29 ENCOUNTER — Telehealth: Payer: Self-pay | Admitting: *Deleted

## 2013-06-29 NOTE — Telephone Encounter (Signed)
Received vm call from pt stating that she received a call about a missed appt & she didn't know about the change & needs to clear this up.  Returned call & got busy signal at pt's home # & called mobile number & reached daughter, Misty Stanley who called to schedule an appt originally.  She reports that she is having some medical problems & is at the endocrinologist office now & has now been placed on the liver transplant list & her mom has been taking care of her & b/c of all the appts, she forgot about mom's appt.  She would like to r/s.  Some alternate ph #'s given, Mom's cell # is [phone] & sister-Gail Tindall # [phone].   Message to Dr Cyndie Chime.

## 2013-08-06 ENCOUNTER — Telehealth: Payer: Self-pay | Admitting: *Deleted

## 2013-08-06 NOTE — Telephone Encounter (Signed)
I called patient to check in.  Patient reports that she is doing well.  She says that she experiences a little shortness of breath but not enough to interfere with her normal activities.  She reports that she continues to assist with the care of her daughter, Misty Stanley, who is experiencing liver failure.  We discussed that she needs to reschedule an appointment to f/u with Dr. Cyndie Chime.  Patient states that she has been very busy with her daughter and that she will schedule an appointment soon.  Patient denied any questions or concerns at this time.  I encouraged her to call me for any needs.  Patient verbalized understanding.

## 2013-08-20 ENCOUNTER — Encounter: Payer: Self-pay | Admitting: Nurse Practitioner

## 2013-08-20 ENCOUNTER — Ambulatory Visit (INDEPENDENT_AMBULATORY_CARE_PROVIDER_SITE_OTHER): Payer: Medicare Other | Admitting: Nurse Practitioner

## 2013-08-20 VITALS — BP 140/86 | HR 67 | Ht 62.0 in | Wt 179.1 lb

## 2013-08-20 DIAGNOSIS — R002 Palpitations: Secondary | ICD-10-CM

## 2013-08-20 DIAGNOSIS — R0600 Dyspnea, unspecified: Secondary | ICD-10-CM

## 2013-08-20 DIAGNOSIS — E785 Hyperlipidemia, unspecified: Secondary | ICD-10-CM

## 2013-08-20 DIAGNOSIS — R0609 Other forms of dyspnea: Secondary | ICD-10-CM

## 2013-08-20 DIAGNOSIS — R634 Abnormal weight loss: Secondary | ICD-10-CM

## 2013-08-20 DIAGNOSIS — I4891 Unspecified atrial fibrillation: Secondary | ICD-10-CM

## 2013-08-20 LAB — CBC WITH DIFFERENTIAL/PLATELET
Basophils Absolute: 0 10*3/uL (ref 0.0–0.1)
Basophils Relative: 0.6 % (ref 0.0–3.0)
Eosinophils Absolute: 0.1 10*3/uL (ref 0.0–0.7)
Eosinophils Relative: 1.9 % (ref 0.0–5.0)
HCT: 40.5 % (ref 36.0–46.0)
Hemoglobin: 13.7 g/dL (ref 12.0–15.0)
Lymphocytes Relative: 24.1 % (ref 12.0–46.0)
Lymphs Abs: 1.8 10*3/uL (ref 0.7–4.0)
MCHC: 33.8 g/dL (ref 30.0–36.0)
MCV: 85.5 fl (ref 78.0–100.0)
Monocytes Absolute: 0.6 10*3/uL (ref 0.1–1.0)
Monocytes Relative: 8 % (ref 3.0–12.0)
Neutro Abs: 4.8 10*3/uL (ref 1.4–7.7)
Neutrophils Relative %: 65.4 % (ref 43.0–77.0)
Platelets: 303 10*3/uL (ref 150.0–400.0)
RBC: 4.74 Mil/uL (ref 3.87–5.11)
RDW: 14.9 % — ABNORMAL HIGH (ref 11.5–14.6)
WBC: 7.4 10*3/uL (ref 4.5–10.5)

## 2013-08-20 LAB — BASIC METABOLIC PANEL
BUN: 34 mg/dL — ABNORMAL HIGH (ref 6–23)
CO2: 28 mEq/L (ref 19–32)
Calcium: 9.3 mg/dL (ref 8.4–10.5)
Chloride: 103 mEq/L (ref 96–112)
Creatinine, Ser: 1.5 mg/dL — ABNORMAL HIGH (ref 0.4–1.2)
GFR: 35.14 mL/min — ABNORMAL LOW (ref >60.00)
Glucose, Bld: 105 mg/dL — ABNORMAL HIGH (ref 70–99)
Potassium: 3.8 mEq/L (ref 3.5–5.1)
Sodium: 140 mEq/L (ref 135–145)

## 2013-08-20 LAB — HEPATIC FUNCTION PANEL
ALT: 12 U/L (ref 0–35)
AST: 17 U/L (ref 0–37)
Albumin: 4.2 g/dL (ref 3.5–5.2)
Alkaline Phosphatase: 76 U/L (ref 39–117)
Bilirubin, Direct: 0.1 mg/dL (ref 0.0–0.3)
Total Bilirubin: 1 mg/dL (ref 0.3–1.2)
Total Protein: 7.9 g/dL (ref 6.0–8.3)

## 2013-08-20 LAB — LIPID PANEL
Cholesterol: 229 mg/dL — ABNORMAL HIGH (ref 0–200)
HDL: 46 mg/dL (ref >39.00)
Total CHOL/HDL Ratio: 5
Triglycerides: 142 mg/dL (ref 0.0–149.0)
VLDL: 28.4 mg/dL (ref 0.0–40.0)

## 2013-08-20 LAB — TSH: TSH: 3.18 u[IU]/mL (ref 0.35–5.50)

## 2013-08-20 LAB — LDL CHOLESTEROL, DIRECT: Direct LDL: 159.3 mg/dL

## 2013-08-20 LAB — BRAIN NATRIURETIC PEPTIDE: Pro B Natriuretic peptide (BNP): 73 pg/mL (ref 0.0–100.0)

## 2013-08-20 NOTE — Patient Instructions (Addendum)
Stay on your current medicines  We will check labs today  We are going to update your ultrasound of your heart  If this all turns out ok and you are still having symptoms - we will put a heart monitor on  Call the [facility] office at [phone] if you have any questions, problems or concerns.

## 2013-08-20 NOTE — Progress Notes (Signed)
Alexa [name] Date of Birth: 11/01/1936 Medical Record #[id number]  History of Present Illness: Alexa [name] is seen back today for a follow up visit. Seen for Dr. Shirlee Latch. Has non-small cell right lung cancer with recent lobectomy, chronic SOB, anemia, prior breast cancer with mastectomy in 2002, HTN, venous insufficiency, remote tobacco abuse, GERD and HLD with intolerance to statins. Has been on chronic anticoagulation due to DVT.   Last seen in March of 2013 - seemed to be doing ok.   Underwent lobectomy of the right lung back earlier this summer. Has been rescheduling her oncology visits due to a sick daughter - apparently no chemo to be given.   Comes in today. Here with her daughter (now doing better). She is here for a "jumping" in her chest. Comes and goes. Grabby-like sensation in her chest. Nothing she can do to bring this on or make it go away. Not exertional. Says this has been going on for about a month. Not dizzy. Not passing out. Breathing "little different". Daughter thinks she has more DOE. No swelling. Weight is down over 25 pounds since last visit here. Reports appetite is down. Not exercising but tries to stay active. She has had no labs done since her discharge back in the summer. Has missed her follow up with her PCP as well.   Current Outpatient Prescriptions  Medication Sig Dispense Refill  . donepezil (ARICEPT) 10 MG tablet Take 1 tablet (10 mg total) by mouth at bedtime.  90 tablet  0  . furosemide (LASIX) 40 MG tablet Take 40 mg by mouth 2 (two) times daily.      Marland Kitchen levothyroxine (SYNTHROID, LEVOTHROID) 137 MCG tablet Take 137 mcg by mouth daily before breakfast.      . morphine (MSIR) 30 MG tablet Take 30 mg by mouth 3 (three) times daily as needed for pain.       Marland Kitchen oxyCODONE-acetaminophen (PERCOCET) 10-325 MG per tablet Take 1 tablet by mouth every 4 (four) hours as needed (pain).       . potassium chloride SA (K-DUR,KLOR-CON) 20 MEQ tablet Take 20 mEq by mouth  daily as needed (hypokalemia).      . rosuvastatin (CRESTOR) 10 MG tablet Take 10 mg by mouth every other day.      . TRAVATAN Z 0.004 % SOLN ophthalmic solution Place 1 drop into both eyes at bedtime.       . valsartan-hydrochlorothiazide (DIOVAN-HCT) 320-25 MG per tablet Take 1 tablet by mouth daily.       No current facility-administered medications for this visit.    Allergies  Allergen Reactions  . Strawberry Swelling    Swelling ,rash  . Hydrocodone-Homatropine     REACTION: Nausea  . Nsaids   . Penicillins     REACTION: rash  . Simvastatin     REACTION: muscle pain    Past Medical History  Diagnosis Date  . Hyperlipidemia   . Hypertension   . Cancer     breast  . GERD (gastroesophageal reflux disease)   . Diverticulosis   . Hx of adenomatous colonic polyps   . DVT (deep venous thrombosis) 09-30-11    right lower leg   . Arthritis   . Hypothyroidism   . Dementia     sees Dr. Vickey Huger  . Peripheral vascular disease 13    leg clot? rx  . History of kidney stones     Past Surgical History  Procedure Laterality Date  . Cholecystectomy  2007  .  Breast surgery  2002    BL mastectomy  . Lumbar laminectomy      x3  . Rotator cuff repair Right 10  . Cervical spine surgery    . Hip surgery    . Spine surgery    . Abdominal hysterectomy    . Fiberoptic bronchoscopy with electromagnetic  10/07/2010  . Video bronchoscopy N/A 05/14/2013    Procedure: VIDEO BRONCHOSCOPY;  Surgeon: Delight Ovens, MD;  Location: [facility] OR;  Service: Thoracic;  Laterality: N/A;  . Video assisted thoracoscopy (vats)/ lobectomy Right 05/14/2013    Procedure: VIDEO ASSISTED THORACOSCOPY (VATS)/ LOBECTOMY;  Surgeon: Delight Ovens, MD;  Location: [facility] OR;  Service: Thoracic;  Laterality: Right;    History  Smoking status  . Former Smoker -- 1.00 packs/day for 25 years  . Types: Cigarettes  . Quit date: 05/10/1980  Smokeless tobacco  . Never Used    History  Alcohol Use No     Family History  Problem Relation Age of Onset  . Diabetes Daughter   . Diabetes Daughter   . Heart disease Father   . Heart disease Brother   . Heart disease Sister   . Colon cancer Neg Hx   . Colon polyps Daughter     Review of Systems: The review of systems is per the HPI.  All other systems were reviewed and are negative.  Physical Exam: BP 140/86  Pulse 67  Ht 5\' 2"  (1.575 m)  Wt 179 lb 1.9 oz (81.248 kg)  BMI 32.75 kg/m2 Patient is very pleasant and in no acute distress. Skin is warm and dry. Color is normal.  HEENT is unremarkable. Normocephalic/atraumatic. PERRL. Sclera are nonicteric. Neck is supple. No masses. No JVD. Lungs are clear. Cardiac exam shows a regular rate and rhythm. Abdomen is soft. Extremities are without edema. Gait and ROM are intact. No gross neurologic deficits noted.  LABORATORY DATA: EKG today shows sinus rhythm and she has U waves noted.   Lab Results  Component Value Date   WBC 9.0 05/18/2013   HGB 10.9* 05/18/2013   HCT 31.8* 05/18/2013   PLT 214 05/18/2013   GLUCOSE 107* 05/21/2013   CHOL 211* 12/11/2012   TRIG 107.0 12/11/2012   HDL 38.80* 12/11/2012   LDLDIRECT 143.6 12/11/2012   LDLCALC 123* 03/02/2012   ALT 31 05/16/2013   AST 36 05/16/2013   NA 138 05/21/2013   K 3.2* 05/21/2013   CL 97 05/21/2013   CREATININE 1.15* 05/21/2013   BUN 31* 05/21/2013   CO2 30 05/21/2013   TSH 6.96* 12/04/2012   INR 0.91 05/10/2013    Echo Study Conclusions from 2012  - Left ventricle: The cavity size was normal. Wall thickness was normal. Systolic function was normal. The estimated ejection fraction was in the range of 60% to 65%. Wall motion was normal; there were no regional wall motion abnormalities. Left ventricular diastolic function parameters were normal. - Mitral valve: Mild regurgitation. - Right ventricle: The cavity size was mildly dilated. - Atrial septum: No defect or patent foramen ovale was identified. - Tricuspid valve: Moderate  regurgitation. - Pulmonary arteries: Systolic pressure was moderately increased. PA peak pressure: 48mm Hg (S).   Myoview Impression from 2012  Exercise Capacity: Fair exercise capacity.  BP Response: Normal blood pressure response.  Clinical Symptoms: Mild chest pain/dyspnea.  ECG Impression: No significant ST segment change suggestive of ischemia.  Comparison with Prior Nuclear Study: No previous nuclear study performed   Overall Impression: Normal stress  nuclear study. No evidence of ischemia. Normal LV function with an EF of 68%.  Alvia Grove., MD, [facility]    Assessment / Plan: 1. Chronic DOE - felt to be multifactorial due to diastolic HF, mild pulmonary HTN, obesity and lung cancer/lobectomy - will update her echo. Needs her labs updated as well.   2. HTN - BP fair.  3. HLD - statin intolerant. Needs labs checked.  4. Obesity - has lost weight over the past 1 1/2 years.   5. ?Palpitations - I do not think she is actually having chest pain/discomfort - sounds like palpitations - need to check her labs and update her echo - would then consider an event monitor (symptoms are not every day).   6. Lung cancer - s/p lobectomy - apparently no chemo needed per phone note from Dr. Cyndie Chime - seeing Dr. Tyrone Sage next month with CXR.  Further disposition to follow.   Patient is agreeable to this plan and will call if any problems develop in the interim.   Rosalio Macadamia, RN, ANP-C [facility] [address] [place], [place]  [address]

## 2013-08-21 ENCOUNTER — Telehealth: Payer: Self-pay | Admitting: Cardiology

## 2013-08-21 NOTE — Telephone Encounter (Signed)
Follow Up:  Pt's daughter Misty Stanley is calling the nurse back in regards to blood work results.

## 2013-08-22 ENCOUNTER — Other Ambulatory Visit: Payer: Self-pay | Admitting: *Deleted

## 2013-08-22 DIAGNOSIS — N189 Chronic kidney disease, unspecified: Secondary | ICD-10-CM

## 2013-08-23 ENCOUNTER — Ambulatory Visit (INDEPENDENT_AMBULATORY_CARE_PROVIDER_SITE_OTHER): Payer: Medicare Other | Admitting: Family Medicine

## 2013-08-23 ENCOUNTER — Encounter: Payer: Self-pay | Admitting: Family Medicine

## 2013-08-23 VITALS — BP 124/80 | Temp 98.2°F | Wt 180.0 lb

## 2013-08-23 DIAGNOSIS — Z23 Encounter for immunization: Secondary | ICD-10-CM

## 2013-08-23 DIAGNOSIS — L989 Disorder of the skin and subcutaneous tissue, unspecified: Secondary | ICD-10-CM

## 2013-08-23 MED ORDER — DOXYCYCLINE HYCLATE 100 MG PO CAPS: 100.0000 mg | ORAL_CAPSULE | Freq: Two times a day (BID) | ORAL | Status: DC

## 2013-08-23 NOTE — Patient Instructions (Signed)
-  warm compresses twice daily  -take antibiotic as instructed -As we discussed, we have prescribed a new medication for you at this appointment. We discussed the common and serious potential adverse effects of this medication and you can review these and more with the pharmacist when you pick up your medication.  Please follow the instructions for use carefully and notify us immediately if you have any problems taking this medication.  -have someone check this daily to make sure not worsening - if worsening/spreading redness see a doctor immediately  -follow up with your doctor in 3-5 days to recheck

## 2013-08-23 NOTE — Progress Notes (Signed)
Chief Complaint  Patient presents with  . spot on back    sore    HPI:  Acute visit for sore on back: -started a week or so ago -sore bump on back - she reports there was nothing there before -she has been mashing on it, doesn't know if drained -denies fevers, or chills or malaise  ROS: See pertinent positives and negatives per HPI.  Past Medical History  Diagnosis Date  . Hyperlipidemia   . Hypertension   . Cancer     breast  . GERD (gastroesophageal reflux disease)   . Diverticulosis   . Hx of adenomatous colonic polyps   . DVT (deep venous thrombosis) 09-30-11    right lower leg   . Arthritis   . Hypothyroidism   . Dementia     sees Dr. Vickey Huger  . Peripheral vascular disease 13    leg clot? rx  . History of kidney stones     Past Surgical History  Procedure Laterality Date  . Cholecystectomy  2007  . Breast surgery  2002    BL mastectomy  . Lumbar laminectomy      x3  . Rotator cuff repair Right 10  . Cervical spine surgery    . Hip surgery    . Spine surgery    . Abdominal hysterectomy    . Fiberoptic bronchoscopy with electromagnetic  10/07/2010  . Video bronchoscopy N/A 05/14/2013    Procedure: VIDEO BRONCHOSCOPY;  Surgeon: Delight Ovens, MD;  Location: [facility] OR;  Service: Thoracic;  Laterality: N/A;  . Video assisted thoracoscopy (vats)/ lobectomy Right 05/14/2013    Procedure: VIDEO ASSISTED THORACOSCOPY (VATS)/ LOBECTOMY;  Surgeon: Delight Ovens, MD;  Location: [facility] OR;  Service: Thoracic;  Laterality: Right;    Family History  Problem Relation Age of Onset  . Diabetes Daughter   . Diabetes Daughter   . Heart disease Father   . Heart disease Brother   . Heart disease Sister   . Colon cancer Neg Hx   . Colon polyps Daughter     History   Social History  . Marital Status: Widowed    Spouse Name: N/A    Number of Children: N/A  . Years of Education: N/A   Social History Main Topics  . Smoking status: Former Smoker -- 1.00 packs/day for  25 years    Types: Cigarettes    Quit date: 05/10/1980  . Smokeless tobacco: Never Used  . Alcohol Use: No  . Drug Use: No  . Sexual Activity: No   Other Topics Concern  . None   Social History Narrative  . None    Current outpatient prescriptions:donepezil (ARICEPT) 10 MG tablet, Take 1 tablet (10 mg total) by mouth at bedtime., Disp: 90 tablet, Rfl: 0;  furosemide (LASIX) 40 MG tablet, Take 40 mg by mouth 2 (two) times daily., Disp: , Rfl: ;  levothyroxine (SYNTHROID, LEVOTHROID) 137 MCG tablet, Take 137 mcg by mouth daily before breakfast., Disp: , Rfl:  morphine (MSIR) 30 MG tablet, Take 30 mg by mouth 3 (three) times daily as needed for pain. , Disp: , Rfl: ;  oxyCODONE-acetaminophen (PERCOCET) 10-325 MG per tablet, Take 1 tablet by mouth every 4 (four) hours as needed (pain). , Disp: , Rfl: ;  potassium chloride SA (K-DUR,KLOR-CON) 20 MEQ tablet, Take 20 mEq by mouth daily as needed (hypokalemia)., Disp: , Rfl:  rosuvastatin (CRESTOR) 10 MG tablet, Take 10 mg by mouth every other day., Disp: , Rfl: ;  TRAVATAN Z 0.004 % SOLN ophthalmic solution, Place 1 drop into both eyes at bedtime. , Disp: , Rfl: ;  valsartan-hydrochlorothiazide (DIOVAN-HCT) 320-25 MG per tablet, Take 1 tablet by mouth daily., Disp: , Rfl: ;  doxycycline (VIBRAMYCIN) 100 MG capsule, Take 1 capsule (100 mg total) by mouth 2 (two) times daily., Disp: 10 capsule, Rfl: 0  EXAM:  Filed Vitals:   08/23/13 1339  BP: 124/80  Temp: 98.2 F (36.8 C)    Body mass index is 32.91 kg/(m^2).  GENERAL: vitals reviewed and listed above, alert, oriented, appears well hydrated and in no acute distress  HEENT: atraumatic, conjunttiva clear, no obvious abnormalities on inspection of external nose and ears  NECK: no obvious masses on inspection  Skin: small < 1 cm in diameter tender papule with central punctate R upper back   MS: moves all extremities without noticeable abnormality  PSYCH: pleasant and cooperative, no  obvious depression or anxiety  ASSESSMENT AND PLAN:  Discussed the following assessment and plan:  Need for prophylactic vaccination and inoculation against influenza - Plan: Flu Vaccine QUAD 36+ mos PF IM (Fluarix)  Skin lesion - Plan: doxycycline (VIBRAMYCIN) 100 MG capsule  -looks like a small boil that has drained  - discussed other potential etiologies less likely given acute lesion -advised warm compresses and short course doxy with close follow up to recheck -Patient advised to return or notify a doctor immediately if symptoms worsen or persist or new concerns arise.  Patient Instructions  -warm compresses twice daily  -take antibiotic as instructed -As we discussed, we have prescribed a new medication for you at this appointment. We discussed the common and serious potential adverse effects of this medication and you can review these and more with the pharmacist when you pick up your medication.  Please follow the instructions for use carefully and notify us immediately if you have any problems taking this medication.  -have someone check this daily to make sure not worsening - if worsening/spreading redness see a doctor immediately  -follow up with your doctor in 3-5 days to recheck     Alexa Basque R.

## 2013-08-28 ENCOUNTER — Ambulatory Visit (INDEPENDENT_AMBULATORY_CARE_PROVIDER_SITE_OTHER): Payer: Medicare Other | Admitting: Family Medicine

## 2013-08-28 ENCOUNTER — Encounter: Payer: Self-pay | Admitting: Family Medicine

## 2013-08-28 VITALS — BP 130/72 | HR 100 | Temp 97.7°F | Wt 181.0 lb

## 2013-08-28 DIAGNOSIS — L989 Disorder of the skin and subcutaneous tissue, unspecified: Secondary | ICD-10-CM

## 2013-08-28 NOTE — Progress Notes (Signed)
  Subjective:    Patient ID: Alexa [name], female    DOB: 1936-07-01, 77 y.o.   MRN: [id number]  HPI Here to follow up a tender spot on her back that she noticed about 3 weeks ago. She was here 5 days ago and it was felt that this may be a boil. She has been taking Doxycycline but nothing has changed.   Review of Systems  Constitutional: Negative.        Objective:   Physical Exam  Constitutional: She appears well-developed and well-nourished. No distress.  Skin:  The right upper back has a scaly red papular lesion that is quite tender. There is a zone of inflammation around it.           Assessment & Plan:  This appears to be a neoplasm, probably a squamous cell cancer. We will arrange for this to be removed ASAP.

## 2013-09-05 ENCOUNTER — Ambulatory Visit (INDEPENDENT_AMBULATORY_CARE_PROVIDER_SITE_OTHER): Payer: Medicare Other | Admitting: *Deleted

## 2013-09-05 ENCOUNTER — Other Ambulatory Visit: Payer: Self-pay | Admitting: *Deleted

## 2013-09-05 DIAGNOSIS — N189 Chronic kidney disease, unspecified: Secondary | ICD-10-CM

## 2013-09-05 DIAGNOSIS — E876 Hypokalemia: Secondary | ICD-10-CM

## 2013-09-05 LAB — BASIC METABOLIC PANEL WITH GFR
BUN: 36 mg/dL — ABNORMAL HIGH (ref 6–23)
CO2: 27 meq/L (ref 19–32)
Calcium: 8.9 mg/dL (ref 8.4–10.5)
Chloride: 106 meq/L (ref 96–112)
Creatinine, Ser: 1.3 mg/dL — ABNORMAL HIGH (ref 0.4–1.2)
GFR: 42.78 mL/min — ABNORMAL LOW
Glucose, Bld: 93 mg/dL (ref 70–99)
Potassium: 3.3 meq/L — ABNORMAL LOW (ref 3.5–5.1)
Sodium: 144 meq/L (ref 135–145)

## 2013-09-05 MED ORDER — POTASSIUM CHLORIDE CRYS ER 20 MEQ PO TBCR: 20.0000 meq | EXTENDED_RELEASE_TABLET | Freq: Every day | ORAL | Status: DC

## 2013-09-07 ENCOUNTER — Telehealth: Payer: Self-pay | Admitting: Family Medicine

## 2013-09-07 DIAGNOSIS — E876 Hypokalemia: Secondary | ICD-10-CM

## 2013-09-07 NOTE — Telephone Encounter (Signed)
Pt called and stated that she needed a refill of her potassium chloride SA (K-DUR,KLOR-CON) 20 MEQ tablet. She states that she would like it called into the [facility] at [place]. Please assist.

## 2013-09-10 MED ORDER — POTASSIUM CHLORIDE CRYS ER 20 MEQ PO TBCR: 20.0000 meq | EXTENDED_RELEASE_TABLET | Freq: Every day | ORAL | Status: DC

## 2013-09-10 NOTE — Telephone Encounter (Signed)
Rx sent to Pharmacy

## 2013-09-12 ENCOUNTER — Ambulatory Visit (HOSPITAL_COMMUNITY): Payer: Medicare Other | Attending: Cardiology | Admitting: Radiology

## 2013-09-12 ENCOUNTER — Other Ambulatory Visit (HOSPITAL_COMMUNITY): Payer: Self-pay | Admitting: Cardiology

## 2013-09-12 ENCOUNTER — Other Ambulatory Visit (INDEPENDENT_AMBULATORY_CARE_PROVIDER_SITE_OTHER): Payer: Medicare Other

## 2013-09-12 DIAGNOSIS — E785 Hyperlipidemia, unspecified: Secondary | ICD-10-CM

## 2013-09-12 DIAGNOSIS — I2789 Other specified pulmonary heart diseases: Secondary | ICD-10-CM | POA: Insufficient documentation

## 2013-09-12 DIAGNOSIS — R002 Palpitations: Secondary | ICD-10-CM

## 2013-09-12 DIAGNOSIS — R0600 Dyspnea, unspecified: Secondary | ICD-10-CM

## 2013-09-12 DIAGNOSIS — R0602 Shortness of breath: Secondary | ICD-10-CM | POA: Insufficient documentation

## 2013-09-12 DIAGNOSIS — I079 Rheumatic tricuspid valve disease, unspecified: Secondary | ICD-10-CM | POA: Insufficient documentation

## 2013-09-12 DIAGNOSIS — Z87891 Personal history of nicotine dependence: Secondary | ICD-10-CM | POA: Insufficient documentation

## 2013-09-12 DIAGNOSIS — Z853 Personal history of malignant neoplasm of breast: Secondary | ICD-10-CM | POA: Insufficient documentation

## 2013-09-12 DIAGNOSIS — I080 Rheumatic disorders of both mitral and aortic valves: Secondary | ICD-10-CM | POA: Insufficient documentation

## 2013-09-12 DIAGNOSIS — I10 Essential (primary) hypertension: Secondary | ICD-10-CM | POA: Insufficient documentation

## 2013-09-12 DIAGNOSIS — R634 Abnormal weight loss: Secondary | ICD-10-CM

## 2013-09-12 DIAGNOSIS — Z85118 Personal history of other malignant neoplasm of bronchus and lung: Secondary | ICD-10-CM | POA: Insufficient documentation

## 2013-09-12 DIAGNOSIS — E876 Hypokalemia: Secondary | ICD-10-CM

## 2013-09-12 DIAGNOSIS — E039 Hypothyroidism, unspecified: Secondary | ICD-10-CM | POA: Insufficient documentation

## 2013-09-12 LAB — BASIC METABOLIC PANEL
BUN: 30 mg/dL — ABNORMAL HIGH (ref 6–23)
CO2: 30 mEq/L (ref 19–32)
Calcium: 9 mg/dL (ref 8.4–10.5)
Chloride: 102 mEq/L (ref 96–112)
Creatinine, Ser: 1.1 mg/dL (ref 0.4–1.2)
GFR: 54.24 mL/min — ABNORMAL LOW (ref >60.00)
Glucose, Bld: 88 mg/dL (ref 70–99)
Potassium: 4 mEq/L (ref 3.5–5.1)
Sodium: 140 mEq/L (ref 135–145)

## 2013-09-12 NOTE — Progress Notes (Signed)
Echocardiogram performed.  

## 2013-09-20 ENCOUNTER — Ambulatory Visit: Payer: Medicare Other | Admitting: Cardiothoracic Surgery

## 2013-09-24 ENCOUNTER — Other Ambulatory Visit: Payer: Self-pay | Admitting: *Deleted

## 2013-09-26 ENCOUNTER — Telehealth: Payer: Self-pay | Admitting: *Deleted

## 2013-09-26 ENCOUNTER — Ambulatory Visit (INDEPENDENT_AMBULATORY_CARE_PROVIDER_SITE_OTHER): Payer: Medicare Other | Admitting: Nurse Practitioner

## 2013-09-26 ENCOUNTER — Encounter: Payer: Self-pay | Admitting: Nurse Practitioner

## 2013-09-26 VITALS — BP 150/74 | HR 67 | Ht 62.0 in | Wt 186.8 lb

## 2013-09-26 DIAGNOSIS — R0609 Other forms of dyspnea: Secondary | ICD-10-CM

## 2013-09-26 DIAGNOSIS — I2789 Other specified pulmonary heart diseases: Secondary | ICD-10-CM

## 2013-09-26 DIAGNOSIS — I272 Pulmonary hypertension, unspecified: Secondary | ICD-10-CM

## 2013-09-26 DIAGNOSIS — R0600 Dyspnea, unspecified: Secondary | ICD-10-CM

## 2013-09-26 DIAGNOSIS — E876 Hypokalemia: Secondary | ICD-10-CM

## 2013-09-26 LAB — BASIC METABOLIC PANEL
BUN: 29 mg/dL — ABNORMAL HIGH (ref 6–23)
CO2: 30 mEq/L (ref 19–32)
Calcium: 9.1 mg/dL (ref 8.4–10.5)
Chloride: 105 mEq/L (ref 96–112)
Creatinine, Ser: 1.1 mg/dL (ref 0.4–1.2)
GFR: 49.33 mL/min — ABNORMAL LOW (ref >60.00)
Glucose, Bld: 87 mg/dL (ref 70–99)
Potassium: 3.5 mEq/L (ref 3.5–5.1)
Sodium: 143 mEq/L (ref 135–145)

## 2013-09-26 MED ORDER — POTASSIUM CHLORIDE CRYS ER 20 MEQ PO TBCR: 20.0000 meq | EXTENDED_RELEASE_TABLET | Freq: Every day | ORAL | Status: DC

## 2013-09-26 MED ORDER — VALSARTAN 320 MG PO TABS: 320.0000 mg | ORAL_TABLET | Freq: Every day | ORAL | Status: DC

## 2013-09-26 MED ORDER — FUROSEMIDE 40 MG PO TABS: 40.0000 mg | ORAL_TABLET | ORAL | Status: DC

## 2013-09-26 NOTE — Telephone Encounter (Signed)
I called patient to f/u with her and to remind her of her appointment with Dr. Tyrone Sage tomorrow.  Patient was aware of appointment and verbalized understanding of time and place of appointment.  Patient reports that she is doing well and that her surgical site has healed well.  She did have an appointment at the cardiology office today and they increased her Lasix and made a referral to Dr. Delton Coombes for dyspnea.  Patient denied any questions or concerns at this time.  I encouraged her to call for any needs.

## 2013-09-26 NOTE — Progress Notes (Addendum)
Alexa [name] Date of Birth: 1936-03-18 Medical Record #[id number]  History of Present Illness: Alexa [name] is seen back today for a follow up visit after her echo. Seen for Dr. Shirlee Latch. Has non-small cell right lung cancer with recent lobectomy, chronic SOB, anemia, prior breast cancer with mastectomy in 2002, HTN, venous insufficiency, remote tobacco abuse, GERD and HLD with intolerance to statins. Has been on chronic anticoagulation due to DVT.   Last seen by Dr. Shirlee Latch in March of 2013 - seemed to be doing ok.   Underwent lobectomy of the right lung back earlier this summer in 2014. Had been rescheduling her oncology visits due to a sick daughter - apparently no chemo to be given.   Seen a month ago for a variety of complaints - had a "jumping" in her chest. Breathing "little different". Daughter thought she had more DOE. No swelling. Weight is down over 25 pounds since last visit here. Reported appetite was down. We updated her echo and her labs. Considered an event monitor but wanted to see what her labs and echo showed first.   Comes back today. Here with her two daughters today. She says she is doing ok. Still short of breath. Palpitations are better. No chest pain. Weight is up. More swelling - this has gotten worse with the decrease in diuretics - cut back due to her kidney function. Out of potassium. Synthroid dose not clear to the family. No chest pain.   Current Outpatient Prescriptions  Medication Sig Dispense Refill  . donepezil (ARICEPT) 10 MG tablet Take 1 tablet (10 mg total) by mouth at bedtime.  90 tablet  0  . doxycycline (VIBRAMYCIN) 100 MG capsule Take 1 capsule (100 mg total) by mouth 2 (two) times daily.  10 capsule  0  . furosemide (LASIX) 40 MG tablet Take 20 mg by mouth daily.       Marland Kitchen levothyroxine (SYNTHROID, LEVOTHROID) 137 MCG tablet Take 137 mcg by mouth daily before breakfast.      . morphine (MSIR) 30 MG tablet Take 30 mg by mouth 3 (three) times daily as  needed for pain.       Marland Kitchen oxyCODONE-acetaminophen (PERCOCET) 10-325 MG per tablet Take 1 tablet by mouth every 4 (four) hours as needed (pain).       . potassium chloride SA (K-DUR,KLOR-CON) 20 MEQ tablet Take 1 tablet (20 mEq total) by mouth daily.  30 tablet  6  . rosuvastatin (CRESTOR) 10 MG tablet Take 10 mg by mouth every other day.      . TRAVATAN Z 0.004 % SOLN ophthalmic solution Place 1 drop into both eyes at bedtime.       . valsartan-hydrochlorothiazide (DIOVAN-HCT) 320-25 MG per tablet Take 1 tablet by mouth daily.       No current facility-administered medications for this visit.    Allergies  Allergen Reactions  . Strawberry Swelling    Swelling ,rash  . Hydrocodone-Homatropine     REACTION: Nausea  . Nsaids   . Penicillins     REACTION: rash  . Simvastatin     REACTION: muscle pain    Past Medical History  Diagnosis Date  . Hyperlipidemia   . Hypertension   . Cancer     breast  . GERD (gastroesophageal reflux disease)   . Diverticulosis   . Hx of adenomatous colonic polyps   . DVT (deep venous thrombosis) 09-30-11    right lower leg   . Arthritis   . Hypothyroidism   .  Dementia     sees Dr. Vickey Huger  . Peripheral vascular disease 13    leg clot? rx  . History of kidney stones     Past Surgical History  Procedure Laterality Date  . Cholecystectomy  2007  . Breast surgery  2002    BL mastectomy  . Lumbar laminectomy      x3  . Rotator cuff repair Right 10  . Cervical spine surgery    . Hip surgery    . Spine surgery    . Abdominal hysterectomy    . Fiberoptic bronchoscopy with electromagnetic  10/07/2010  . Video bronchoscopy N/A 05/14/2013    Procedure: VIDEO BRONCHOSCOPY;  Surgeon: Delight Ovens, MD;  Location: [facility] OR;  Service: Thoracic;  Laterality: N/A;  . Video assisted thoracoscopy (vats)/ lobectomy Right 05/14/2013    Procedure: VIDEO ASSISTED THORACOSCOPY (VATS)/ LOBECTOMY;  Surgeon: Delight Ovens, MD;  Location: [facility] OR;  Service:  Thoracic;  Laterality: Right;    History  Smoking status  . Former Smoker -- 1.00 packs/day for 25 years  . Types: Cigarettes  . Quit date: 05/10/1980  Smokeless tobacco  . Never Used    History  Alcohol Use No    Family History  Problem Relation Age of Onset  . Diabetes Daughter   . Diabetes Daughter   . Heart disease Father   . Heart disease Brother   . Heart disease Sister   . Colon cancer Neg Hx   . Colon polyps Daughter     Review of Systems: The review of systems is per the HPI.  All other systems were reviewed and are negative.  Physical Exam: BP 150/74  Pulse 67  Ht 5\' 2"  (1.575 m)  Wt 186 lb 12.8 oz (84.732 kg)  BMI 34.16 kg/m2  SpO2 98% Weight is up 7 pounds since last visit.  Patient is very pleasant and in no acute distress. Skin is warm and dry. Color is normal.  HEENT is unremarkable. Normocephalic/atraumatic. PERRL. Sclera are nonicteric. Neck is supple. No masses. No JVD. Lungs are fairly clear. Cardiac exam shows a regular rate and rhythm. Abdomen is soft. Extremities are with mild edema. Gait and ROM are intact. No gross neurologic deficits noted.  LABORATORY DATA: PENDING  Lab Results  Component Value Date   WBC 7.4 08/20/2013   HGB 13.7 08/20/2013   HCT 40.5 08/20/2013   PLT 303.0 08/20/2013   GLUCOSE 88 09/12/2013   CHOL 229* 08/20/2013   TRIG 142.0 08/20/2013   HDL 46.00 08/20/2013   LDLDIRECT 159.3 08/20/2013   LDLCALC 123* 03/02/2012   ALT 12 08/20/2013   AST 17 08/20/2013   NA 140 09/12/2013   K 4.0 09/12/2013   CL 102 09/12/2013   CREATININE 1.1 09/12/2013   BUN 30* 09/12/2013   CO2 30 09/12/2013   TSH 3.18 08/20/2013   INR 0.91 05/10/2013   Echo Study Conclusions  - Left ventricle: The cavity size was normal. Wall thickness was normal. Systolic function was normal. The estimated ejection fraction was in the range of 55% to 60%. Wall motion was normal; there were no regional wall motion abnormalities. Features are consistent with a  pseudonormal left ventricular filling pattern, with concomitant abnormal relaxation and increased filling pressure (grade 2 diastolic dysfunction). - Aortic valve: There was no stenosis. - Mitral valve: Mildly calcified annulus. Normal thickness leaflets . Trivial regurgitation. - Left atrium: The atrium was mildly dilated. - Right ventricle: The cavity size was normal. Systolic function was  normal. - Tricuspid valve: Peak RV-RA gradient:31mm Hg (S). - Pulmonary arteries: PA systolic pressure 53-57 mmHg. - Systemic veins: IVC measured 2.1 cm with normal respirophasic variation, suggesting RA pressure 6-10 mmHg. Impressions:  - Normal LV size and systolic function, EF 55-60%. Moderate diastolic dysfunction. Normal RV size and systolic function. Moderate pulmonary hypertension.    Assessment / Plan: 1. Dyspnea - most likely multifactorial given her lung cancer/surgery, diastolic dysfunction and pulmonary HTN. I am increasing her lasix back up - she looks like she is volume overloaded today. Will get her to see Dr. Delton Coombes for his opinion and possible treatment options that she would benefit from.  2. CKD - recheck her labs today. I have stopped the HCTZ since she is on Lasix - lasix is increased today  3. Palpitations - resolved - don't think we need to do anything further at this time.   4. HTN - stopping her HCTZ - lasix is increased - will see her back to make further adjustments.   Patient is agreeable to this plan and will call if any problems develop in the interim.   Rosalio Macadamia, RN, ANP-C [facility] [address] [place], [place]  [address]   Addendum: After patient's visit - her labs that were drawn at the visit were called to the family - the medicine list is TOTALLY INCORRECT.  Pt's daughter aware of lab results daughter also reviewed medication list, pt is out of levothyroxine 137 mcg would like to know if Lawson Fiscal will refill  until pt sees PCP on 11/4, also is not on crestor and tobramycin is new eye drop, Pt is also taking nexium 40 mg daily, Doxy is a change dose at 50 mg qid and Amiodarone 200 mg daily. I will adjust medication list and will route to Lawson Fiscal  I have not been aware that patient is on amiodarone - another reason for her dyspnea. Will ask them to bring ALL bottles to ALL appointments. This makes it quite difficult to manage her when the family and patient are not aware of what she is actually taking. I do not understand why she is on Doxycycline. She will need to have surveillance labs every 3 to 4 months - will go ahead and proceed with referral to pulmonary as done at her visit.   Rosalio Macadamia, RN, ANP-C [facility] [address] [place], [place]  [address]

## 2013-09-26 NOTE — Patient Instructions (Addendum)
Take this list of medicines given today and verify with your bottles at home.  Stop the Diovan HCT and use just plain Diovan 320 mg - take each morning  Increase the Lasix back to a full dose 40 mg a day - take each morning  I have refilled the potassium today  We need to check labs today  I am sending you to Dr. Delton Coombes = the lung doctor about your breathing/pulmonary HTN, etc  I will see you in 6 weeks  Call the [facility] office at [phone] if you have any questions, problems or concerns.

## 2013-09-27 ENCOUNTER — Other Ambulatory Visit: Payer: Self-pay | Admitting: *Deleted

## 2013-09-27 ENCOUNTER — Ambulatory Visit: Payer: Medicare Other | Admitting: Cardiothoracic Surgery

## 2013-09-27 ENCOUNTER — Telehealth: Payer: Self-pay | Admitting: Nurse Practitioner

## 2013-09-27 MED ORDER — DOXYCYCLINE HYCLATE 50 MG PO CAPS: 50.0000 mg | ORAL_CAPSULE | Freq: Four times a day (QID) | ORAL | Status: DC

## 2013-09-27 MED ORDER — ESOMEPRAZOLE MAGNESIUM 40 MG PO PACK: 40.0000 mg | PACK | Freq: Every day | ORAL | Status: DC

## 2013-09-27 MED ORDER — AMIODARONE HCL 200 MG PO TABS: 200.0000 mg | ORAL_TABLET | Freq: Every day | ORAL | Status: DC

## 2013-09-27 MED ORDER — TOBRAMYCIN-DEXAMETHASONE 0.3-0.1 % OP OINT: TOPICAL_OINTMENT | Freq: Every day | OPHTHALMIC | Status: DC

## 2013-09-27 NOTE — Telephone Encounter (Signed)
New message  Misty Stanley (daughter) is returning your call, regarding her mothers medicine.  Please call

## 2013-09-27 NOTE — Telephone Encounter (Signed)
To Marit Goodwill G 

## 2013-09-28 NOTE — Telephone Encounter (Signed)
S/w Misty Stanley pt's daughter and made aware I sent message to schedulers and will make appt to see pulmonology and will call Misty Stanley with that appt.date. Also made it very clear need all bottles of med, scripts or otc to come to all appts. Misty Stanley agreeable to plan. Misty Stanley stated pt had a refill for levothyroxine so she did not need a script from [place] and stated daughter is now doing pts pill container

## 2013-10-01 ENCOUNTER — Telehealth: Payer: Self-pay | Admitting: *Deleted

## 2013-10-01 ENCOUNTER — Other Ambulatory Visit: Payer: Self-pay | Admitting: *Deleted

## 2013-10-01 NOTE — Telephone Encounter (Signed)
Message copied by Debbe Bales on Mon Oct 01, 2013  1:27 PM ------      Message from: Rosalio Macadamia      Created: Mon Oct 01, 2013 12:32 PM       Can you call her pharmacy and find out what her amiodarone prescription actually says, when filled, etc.             lori ------

## 2013-10-01 NOTE — Telephone Encounter (Signed)
S/w pharmacist at [facility] stated pt is taking (200 mg) bid of Amiodarone and family stated when daughter Misty Stanley called back to go over list of medications that pt was taking ( 200 mg ) daily

## 2013-10-02 ENCOUNTER — Encounter: Payer: Self-pay | Admitting: Family Medicine

## 2013-10-02 ENCOUNTER — Ambulatory Visit (INDEPENDENT_AMBULATORY_CARE_PROVIDER_SITE_OTHER): Payer: Medicare Other | Admitting: Family Medicine

## 2013-10-02 VITALS — BP 124/70 | HR 70 | Temp 98.1°F | Wt 188.0 lb

## 2013-10-02 DIAGNOSIS — C3491 Malignant neoplasm of unspecified part of right bronchus or lung: Secondary | ICD-10-CM

## 2013-10-02 DIAGNOSIS — I10 Essential (primary) hypertension: Secondary | ICD-10-CM

## 2013-10-02 DIAGNOSIS — Z902 Acquired absence of lung [part of]: Secondary | ICD-10-CM

## 2013-10-02 DIAGNOSIS — R609 Edema, unspecified: Secondary | ICD-10-CM

## 2013-10-02 DIAGNOSIS — C3490 Malignant neoplasm of unspecified part of unspecified bronchus or lung: Secondary | ICD-10-CM

## 2013-10-02 DIAGNOSIS — E039 Hypothyroidism, unspecified: Secondary | ICD-10-CM

## 2013-10-02 DIAGNOSIS — Z9889 Other specified postprocedural states: Secondary | ICD-10-CM

## 2013-10-02 DIAGNOSIS — K219 Gastro-esophageal reflux disease without esophagitis: Secondary | ICD-10-CM

## 2013-10-02 DIAGNOSIS — E785 Hyperlipidemia, unspecified: Secondary | ICD-10-CM

## 2013-10-02 LAB — POCT URINALYSIS DIPSTICK
Glucose, UA: NEGATIVE
Nitrite, UA: NEGATIVE
Protein, UA: NEGATIVE
Spec Grav, UA: 1.02
Urobilinogen, UA: 0.2
pH, UA: 7

## 2013-10-02 LAB — CBC WITH DIFFERENTIAL/PLATELET
Eosinophils Absolute: 0.1 10*3/uL (ref 0.0–0.7)
HCT: 38.3 % (ref 36.0–46.0)
Hemoglobin: 12.9 g/dL (ref 12.0–15.0)
MCHC: 33.8 g/dL (ref 30.0–36.0)
MCV: 85.8 fl (ref 78.0–100.0)
Monocytes Absolute: 0.4 10*3/uL (ref 0.1–1.0)
Neutro Abs: 4 10*3/uL (ref 1.4–7.7)
Neutrophils Relative %: 61.8 % (ref 43.0–77.0)
Platelets: 266 10*3/uL (ref 150.0–400.0)
RDW: 14.4 % (ref 11.5–14.6)

## 2013-10-02 LAB — LIPID PANEL
Total CHOL/HDL Ratio: 4
Triglycerides: 130 mg/dL (ref 0.0–149.0)

## 2013-10-02 LAB — HEPATIC FUNCTION PANEL
ALT: 10 U/L (ref 0–35)
AST: 19 U/L (ref 0–37)
Albumin: 4 g/dL (ref 3.5–5.2)

## 2013-10-02 LAB — BASIC METABOLIC PANEL
BUN: 23 mg/dL (ref 6–23)
Calcium: 9 mg/dL (ref 8.4–10.5)
Chloride: 106 mEq/L (ref 96–112)
Glucose, Bld: 90 mg/dL (ref 70–99)
Potassium: 3.7 mEq/L (ref 3.5–5.1)

## 2013-10-02 LAB — LDL CHOLESTEROL, DIRECT: Direct LDL: 148.4 mg/dL

## 2013-10-02 NOTE — Progress Notes (Signed)
  Subjective:    Patient ID: Alexa [name], female    DOB: 09/16/1936, 77 y.o.   MRN: [id number]  HPI 77 yr old female for a cpx. She feels well in general. She sees Cardiology and Pulmonary on a regular basis. She says she has had a mammogram in the past year but I do not have a record of this.    Review of Systems  Constitutional: Negative.   HENT: Negative.   Eyes: Negative.   Respiratory: Negative.   Cardiovascular: Negative.   Gastrointestinal: Negative.   Genitourinary: Negative for dysuria, urgency, frequency, hematuria, flank pain, decreased urine volume, enuresis, difficulty urinating, pelvic pain and dyspareunia.  Musculoskeletal: Negative.   Skin: Negative.   Neurological: Negative.   Psychiatric/Behavioral: Negative.        Objective:   Physical Exam  Constitutional: She is oriented to person, place, and time. She appears well-developed and well-nourished. No distress.  HENT:  Head: Normocephalic and atraumatic.  Right Ear: External ear normal.  Left Ear: External ear normal.  Nose: Nose normal.  Mouth/Throat: Oropharynx is clear and moist. No oropharyngeal exudate.  Eyes: Conjunctivae and EOM are normal. Pupils are equal, round, and reactive to light. No scleral icterus.  Neck: Normal range of motion. Neck supple. No JVD present. No thyromegaly present.  Cardiovascular: Normal rate, regular rhythm, normal heart sounds and intact distal pulses.  Exam reveals no gallop and no friction rub.   No murmur heard. Pulmonary/Chest: Effort normal and breath sounds normal. No respiratory distress. She has no wheezes. She has no rales. She exhibits no tenderness.  Abdominal: Soft. Bowel sounds are normal. She exhibits no distension and no mass. There is no tenderness. There is no rebound and no guarding.  Musculoskeletal: Normal range of motion. She exhibits no edema and no tenderness.  Lymphadenopathy:    She has no cervical adenopathy.  Neurological: She is alert and  oriented to person, place, and time. She has normal reflexes. No cranial nerve deficit. She exhibits normal muscle tone. Coordination normal.  Skin: Skin is warm and dry. No rash noted. No erythema.  Psychiatric: She has a normal mood and affect. Her behavior is normal. Judgment and thought content normal.          Assessment & Plan:  Well exam. Get fasting labs.

## 2013-10-05 NOTE — Progress Notes (Signed)
Quick Note:  I released results in my chart. ______ 

## 2013-10-14 ENCOUNTER — Emergency Department (HOSPITAL_COMMUNITY): Payer: Medicare Other

## 2013-10-14 ENCOUNTER — Emergency Department (HOSPITAL_COMMUNITY)
Admission: EM | Admit: 2013-10-14 | Discharge: 2013-10-14 | Disposition: A | Discharge status: Home / Self Care | Payer: Medicare Other | Attending: Emergency Medicine | Admitting: Emergency Medicine

## 2013-10-14 ENCOUNTER — Encounter (HOSPITAL_COMMUNITY): Payer: Self-pay | Admitting: Emergency Medicine

## 2013-10-14 DIAGNOSIS — Z860100 Personal history of colon polyps, unspecified: Secondary | ICD-10-CM | POA: Insufficient documentation

## 2013-10-14 DIAGNOSIS — K219 Gastro-esophageal reflux disease without esophagitis: Secondary | ICD-10-CM | POA: Insufficient documentation

## 2013-10-14 DIAGNOSIS — W19XXXA Unspecified fall, initial encounter: Secondary | ICD-10-CM

## 2013-10-14 DIAGNOSIS — S79919A Unspecified injury of unspecified hip, initial encounter: Secondary | ICD-10-CM | POA: Insufficient documentation

## 2013-10-14 DIAGNOSIS — F0390 Unspecified dementia without behavioral disturbance: Secondary | ICD-10-CM | POA: Insufficient documentation

## 2013-10-14 DIAGNOSIS — Y9389 Activity, other specified: Secondary | ICD-10-CM | POA: Insufficient documentation

## 2013-10-14 DIAGNOSIS — Z87891 Personal history of nicotine dependence: Secondary | ICD-10-CM | POA: Insufficient documentation

## 2013-10-14 DIAGNOSIS — Z87442 Personal history of urinary calculi: Secondary | ICD-10-CM | POA: Insufficient documentation

## 2013-10-14 DIAGNOSIS — Z86718 Personal history of other venous thrombosis and embolism: Secondary | ICD-10-CM | POA: Insufficient documentation

## 2013-10-14 DIAGNOSIS — Y9229 Other specified public building as the place of occurrence of the external cause: Secondary | ICD-10-CM | POA: Insufficient documentation

## 2013-10-14 DIAGNOSIS — E039 Hypothyroidism, unspecified: Secondary | ICD-10-CM | POA: Insufficient documentation

## 2013-10-14 DIAGNOSIS — Z79899 Other long term (current) drug therapy: Secondary | ICD-10-CM | POA: Insufficient documentation

## 2013-10-14 DIAGNOSIS — S93402A Sprain of unspecified ligament of left ankle, initial encounter: Secondary | ICD-10-CM

## 2013-10-14 DIAGNOSIS — I10 Essential (primary) hypertension: Secondary | ICD-10-CM | POA: Insufficient documentation

## 2013-10-14 DIAGNOSIS — S46909A Unspecified injury of unspecified muscle, fascia and tendon at shoulder and upper arm level, unspecified arm, initial encounter: Secondary | ICD-10-CM | POA: Insufficient documentation

## 2013-10-14 DIAGNOSIS — S4980XA Other specified injuries of shoulder and upper arm, unspecified arm, initial encounter: Secondary | ICD-10-CM | POA: Insufficient documentation

## 2013-10-14 DIAGNOSIS — Z853 Personal history of malignant neoplasm of breast: Secondary | ICD-10-CM | POA: Insufficient documentation

## 2013-10-14 DIAGNOSIS — M129 Arthropathy, unspecified: Secondary | ICD-10-CM | POA: Insufficient documentation

## 2013-10-14 DIAGNOSIS — W010XXA Fall on same level from slipping, tripping and stumbling without subsequent striking against object, initial encounter: Secondary | ICD-10-CM | POA: Insufficient documentation

## 2013-10-14 DIAGNOSIS — S93409A Sprain of unspecified ligament of unspecified ankle, initial encounter: Secondary | ICD-10-CM | POA: Insufficient documentation

## 2013-10-14 DIAGNOSIS — E785 Hyperlipidemia, unspecified: Secondary | ICD-10-CM | POA: Insufficient documentation

## 2013-10-14 MED ORDER — OXYCODONE-ACETAMINOPHEN 5-325 MG PO TABS
1.0000 | ORAL_TABLET | Freq: Once | ORAL | Status: AC
  Administered 2013-10-14: 1 via ORAL
  Filled 2013-10-14: qty 1

## 2013-10-14 NOTE — ED Notes (Signed)
Per family, pt "tripped over herself." Family sts she twisted L ankle and fell onto her R side. Pt c/o L ankle pain and R hip, arm, and shoulder pain. Pt denies loss of consciousness. Pt denies numbness and tingling. Pt was able to walk with walker after the fall but not far. NAD noted, A&Ox4.

## 2013-10-14 NOTE — ED Provider Notes (Signed)
Medical screening examination/treatment/procedure(s) were conducted as a shared visit with non-physician practitioner(s) and myself.  I personally evaluated the patient during the encounter.  EKG Interpretation   None       77 yo female with mechanical fall while at church several hours ago.  Complaining of right shoulder, right hip, and left ankle pain.  No head injury, no LOC, no neck pain.  On exam, well appearing, no signs of head trauma, neck nontender with full ROM.  Right shoulder TTP without deformity.  Right hip with TTP but full ROM without significant pain.  Left ankle mildly swollen, TTP distal foot.  2+ distal pulses in all extremities.  Plain film negative.  Will ambulate and dc with ortho follow up.    Clinical Impression: 1. Ankle sprain, left, initial encounter   2. Fall, initial encounter       Candyce Churn, MD 10/15/13 [phone]

## 2013-10-14 NOTE — ED Notes (Signed)
Pt fell on carpet at chruch. Pt c/o pain to right arm and feet

## 2013-10-14 NOTE — ED Notes (Signed)
Patient transported to X-ray 

## 2013-10-14 NOTE — ED Provider Notes (Signed)
CSN: [id number]     Arrival date & time 10/14/13  1844 History   First MD Initiated Contact with Patient 10/14/13 1904     Chief Complaint  Patient presents with  . Fall   (Consider location/radiation/quality/duration/timing/severity/associated sxs/prior Treatment) HPI Comments: 77 year old female presents to the emergency department with her two daughters complaining of right arm and shoulder pain, right hip pain and left ankle pain after a mechanical fall at church around 11:00 this morning. Fall was witnessed by daughters, she did not hit her head or lose consciousness. She was turning to take a seat on the bench when her foot got stuck on a rug causing her to fall. She has been complaining of increased pain throughout the day, left ankle and right hip pain worse with walking. She was ambulating earlier, however throughout the day the pain became more severe rated 8/10. Denies neck pain or any new back pain outside of her normal chronic back pain. She has not tried any alleviating factors for her symptoms.  Patient is a 77 y.o. female presenting with fall. The history is provided by the patient and a relative.  Fall    Past Medical History  Diagnosis Date  . Hyperlipidemia   . Hypertension   . Cancer     breast  . GERD (gastroesophageal reflux disease)   . Diverticulosis   . Hx of adenomatous colonic polyps   . DVT (deep venous thrombosis) 09-30-11    right lower leg   . Arthritis   . Hypothyroidism   . Dementia     sees Dr. Vickey Huger  . Peripheral vascular disease 13    leg clot? rx  . History of kidney stones    Past Surgical History  Procedure Laterality Date  . Cholecystectomy  2007  . Breast surgery  2002    BL mastectomy  . Lumbar laminectomy      x3  . Rotator cuff repair Right 10  . Cervical spine surgery    . Hip surgery    . Spine surgery    . Abdominal hysterectomy    . Fiberoptic bronchoscopy with electromagnetic  10/07/2010  . Video bronchoscopy N/A  05/14/2013    Procedure: VIDEO BRONCHOSCOPY;  Surgeon: Delight Ovens, MD;  Location: [facility] OR;  Service: Thoracic;  Laterality: N/A;  . Video assisted thoracoscopy (vats)/ lobectomy Right 05/14/2013    Procedure: VIDEO ASSISTED THORACOSCOPY (VATS)/ LOBECTOMY;  Surgeon: Delight Ovens, MD;  Location: [facility] OR;  Service: Thoracic;  Laterality: Right;  . Colonoscopy  02-18-09    per Dr. Russella Dar, adenomatous polyp, repeat in 5 yrs    Family History  Problem Relation Age of Onset  . Diabetes Daughter   . Diabetes Daughter   . Heart disease Father   . Heart disease Brother   . Heart disease Sister   . Colon cancer Neg Hx   . Colon polyps Daughter    History  Substance Use Topics  . Smoking status: Former Smoker -- 1.00 packs/day for 25 years    Types: Cigarettes    Quit date: 05/10/1980  . Smokeless tobacco: Never Used  . Alcohol Use: No   OB History   Grav Para Term Preterm Abortions TAB SAB Ect Mult Living                 Review of Systems  Musculoskeletal:       Positive for right arm, shoulder, hip and left ankle pain.  All other systems reviewed and are  negative.    Allergies  Strawberry; Hydrocodone-homatropine; Nsaids; Penicillins; and Simvastatin  Home Medications   Current Outpatient Rx  Name  Route  Sig  Dispense  Refill  . donepezil (ARICEPT) 10 MG tablet   Oral   Take 1 tablet (10 mg total) by mouth at bedtime.   90 tablet   0     Please Schedule Follow Up Appt   . esomeprazole (NEXIUM) 40 MG packet   Oral   Take 40 mg by mouth daily before breakfast.   30 each   6   . furosemide (LASIX) 40 MG tablet   Oral   Take 1 tablet (40 mg total) by mouth every morning.   30 tablet      . levothyroxine (SYNTHROID, LEVOTHROID) 137 MCG tablet   Oral   Take 137 mcg by mouth daily before breakfast.         . morphine (MSIR) 30 MG tablet   Oral   Take 30 mg by mouth 3 (three) times daily as needed for pain.          Marland Kitchen oxyCODONE-acetaminophen (PERCOCET)  10-325 MG per tablet   Oral   Take 1 tablet by mouth every 4 (four) hours as needed (pain).          . potassium chloride SA (K-DUR,KLOR-CON) 20 MEQ tablet   Oral   Take 1 tablet (20 mEq total) by mouth daily.   30 tablet   6   . rosuvastatin (CRESTOR) 10 MG tablet   Oral   Take 10 mg by mouth every other day.         . tobramycin-dexamethasone (TOBRADEX) ophthalmic ointment   Both Eyes   Place into both eyes daily.   3.5 g   0   . valsartan (DIOVAN) 320 MG tablet   Oral   Take 1 tablet (320 mg total) by mouth daily.   30 tablet   6    BP 165/66  Pulse 68  Temp(Src) 98 F (36.7 C) (Oral)  Resp 18  SpO2 99% Physical Exam  Nursing note and vitals reviewed. Constitutional: She is oriented to person, place, and time. She appears well-developed and well-nourished. No distress.  HENT:  Head: Normocephalic and atraumatic.  Mouth/Throat: Oropharynx is clear and moist.  Eyes: Conjunctivae are normal.  Neck: Normal range of motion. Neck supple. No spinous process tenderness present.  Cardiovascular: Normal rate, regular rhythm, normal heart sounds and intact distal pulses.   Capillary refill < 3 seconds.  Pulmonary/Chest: Effort normal and breath sounds normal.  Musculoskeletal: Normal range of motion. She exhibits no edema.  TTP of right shoulder girdle, proximal humerus. Surgical scar present on shoulder. No obvious deformity. ROM of shoulder causes pain to arm. Skin tear to right elbow. Right elbow non-tender, full ROM. TTP lateral aspect of left hip, no deformity, full ROM. TTP of L ankle, moreso medially with mild swelling. Full ROM, pain noted.  Neurological: She is alert and oriented to person, place, and time. She has normal strength. No sensory deficit.  Skin: Skin is warm and dry. She is not diaphoretic.  Psychiatric: She has a normal mood and affect. Her behavior is normal.    ED Course  Procedures (including critical care time) Labs Review Labs Reviewed -  No data to display Imaging Review Dg Shoulder Right  10/14/2013   CLINICAL DATA:  Post fall, now with right shoulder pain  EXAM: RIGHT SHOULDER - 2+ VIEW  COMPARISON:  Right shoulder MRI -  12/03/2010  FINDINGS: No fracture or dislocation. Glenohumeral and acromioclavicular joint spaces appear preserved given obliquity. A tendinous anchor is noted within in the greater tuberosity. No evidence of calcific tendinitis. Regional soft tissues are normal.  Limited visualization of the adjacent thorax suggests potential postsurgical change of the right upper lobe. Post lower cervical ACDF, incompletely evaluated. Additional presumably paraspinal hardware overlies the mid thoracic spine.  IMPRESSION: 1. No acute findings. 2. Post right-sided rotator cuff repair   Electronically Signed   By: Simonne Come M.D.   On: 10/14/2013 20:13   Dg Hip Complete Right  10/14/2013   CLINICAL DATA:  Right hip pain secondary to a fall.  EXAM: RIGHT HIP - COMPLETE 2+ VIEW  COMPARISON:  None.  FINDINGS: There is no fracture or dislocation. There is mild to moderate osteoarthritis of the right hip with marginal osteophytes and uniform joint space narrowing.  IMPRESSION: No acute abnormality.  Osteoarthritis.   Electronically Signed   By: Geanie Cooley M.D.   On: 10/14/2013 20:07   Dg Ankle Complete Left  10/14/2013   CLINICAL DATA:  Post fall, now with left ankle pain  EXAM: LEFT ANKLE COMPLETE - 3+ VIEW  COMPARISON:  None.  FINDINGS: There is moderate diffuse soft tissue swelling about the lower leg and hindfoot.  No fracture or dislocation. Joint spaces appear preserved. Ankle mortise is preserved. No definite ankle joint effusion. Small plantar calcaneal spur.  IMPRESSION: 1. Moderate diffuse soft tissue swelling about the lower leg and hindfoot without associated fracture. 2. Small plantar calcaneal spur.   Electronically Signed   By: Simonne Come M.D.   On: 10/14/2013 20:14   Dg Humerus Right  10/14/2013   CLINICAL DATA:  Post  fall, now with right upper arm pain  EXAM: RIGHT HUMERUS - 2+ VIEW  COMPARISON:  Right shoulder radiographs-earlier same day  FINDINGS: No fracture or dislocation. Limited visualization of the adjacent shoulder and elbow is normal given obliquity. Regional soft tissues are normal.  IMPRESSION: No acute findings.   Electronically Signed   By: Simonne Come M.D.   On: 10/14/2013 20:13    EKG Interpretation   None       MDM   1. Ankle sprain, left, initial encounter   2. Fall, initial encounter    Patient presenting after witnessed mechanical fall. No LOC. Well appearing, NAD, normal VS. Xrays without acute findings. No deformity. Left ankle sprain- cam walker given. She was able to ambulate without difficulty with her walker. F/u with PCP. Return precautions given. Case discussed with attending Dr. Loretha Stapler who also evaluated patient and agrees with plan of care.     Trevor Mace, PA-C 10/14/13 2134

## 2013-10-15 NOTE — ED Provider Notes (Signed)
Medical screening examination/treatment/procedure(s) were conducted as a shared visit with non-physician practitioner(s) and myself.  I personally evaluated the patient during the encounter.  EKG Interpretation   None         Candyce Churn, MD 10/15/13 [phone]

## 2013-11-06 ENCOUNTER — Encounter: Payer: Self-pay | Admitting: Emergency Medicine

## 2013-11-06 ENCOUNTER — Ambulatory Visit (INDEPENDENT_AMBULATORY_CARE_PROVIDER_SITE_OTHER): Payer: Medicare Other | Admitting: Emergency Medicine

## 2013-11-06 VITALS — BP 140/80 | HR 67 | Ht 63.0 in | Wt 193.4 lb

## 2013-11-06 DIAGNOSIS — R0609 Other forms of dyspnea: Secondary | ICD-10-CM

## 2013-11-06 DIAGNOSIS — R0600 Dyspnea, unspecified: Secondary | ICD-10-CM

## 2013-11-06 NOTE — Assessment & Plan Note (Signed)
Multifactorial dyspnea due to restrictive lung disease status post right upper lobe lobectomy, hypertension with diastolic dysfunction, and a possible contribution of COPD given her smoking history. Would like to recheck PFTs to assess restriction, and possible contribution of obstruction. If indicated we will do a trial of bronchodilators to see if this helps her dyspnea.

## 2013-11-06 NOTE — Patient Instructions (Signed)
Please follow with cardiology next week as planned We will perform full pulmonary function testing at your next office visit Follow with Dr Delton Coombes next available with full PFT

## 2013-11-06 NOTE — Progress Notes (Signed)
Subjective:    Patient ID: Alexa [name], female    DOB: 06/27/1936, 77 y.o.   MRN: [id number]  \ HPI 77 yo woman, former smoker, remote breast CA, s/p RUL lobectomy for 1B adenoCA 6/'14, HTN with diastolic dysfxn. She has had her diuretics adjusted to optimize volume status. Her PFT in 6/'14 (pre-op) did not show significant obstruction. She is not on BD's. She describes exertional SOB, somewhat better since her cardiac meds have been adjusted and she has been recovering from surgery. No cough, no wheeze.    Review of Systems  Constitutional: Negative for fever and unexpected weight change.  HENT: Negative for congestion, dental problem, ear pain, nosebleeds, postnasal drip, rhinorrhea, sinus pressure, sneezing, sore throat and trouble swallowing.   Eyes: Negative for redness and itching.  Respiratory: Positive for shortness of breath. Negative for cough, chest tightness and wheezing.   Cardiovascular: Positive for leg swelling. Negative for palpitations.  Gastrointestinal: Negative for nausea and vomiting.  Genitourinary: Negative for dysuria.  Musculoskeletal: Negative for joint swelling.  Skin: Negative for rash.  Neurological: Negative for headaches.  Hematological: Bruises/bleeds easily.  Psychiatric/Behavioral: Negative for dysphoric mood. The patient is not nervous/anxious.    Past Medical History  Diagnosis Date  . Hyperlipidemia   . Hypertension   . Cancer     breast  . GERD (gastroesophageal reflux disease)   . Diverticulosis   . Hx of adenomatous colonic polyps   . DVT (deep venous thrombosis) 09-30-11    right lower leg   . Arthritis   . Hypothyroidism   . Dementia     sees Dr. Vickey Huger  . Peripheral vascular disease 13    leg clot? rx  . History of kidney stones      Family History  Problem Relation Age of Onset  . Diabetes Daughter   . Diabetes Daughter   . Heart disease Father   . Heart disease Brother   . Heart disease Sister   . Colon cancer Neg  Hx   . Colon polyps Daughter      History   Social History  . Marital Status: Widowed    Spouse Name: N/A    Number of Children: N/A  . Years of Education: N/A   Occupational History  . Not on file.   Social History Main Topics  . Smoking status: Former Smoker -- 1.00 packs/day for 25 years    Types: Cigarettes    Quit date: 05/10/1980  . Smokeless tobacco: Never Used  . Alcohol Use: No  . Drug Use: No  . Sexual Activity: No   Other Topics Concern  . Not on file   Social History Narrative  . No narrative on file     Allergies  Allergen Reactions  . Strawberry Swelling    Swelling ,rash  . Hydrocodone-Homatropine     REACTION: Nausea  . Nsaids   . Penicillins     REACTION: rash  . Simvastatin     REACTION: muscle pain     Outpatient Prescriptions Prior to Visit  Medication Sig Dispense Refill  . donepezil (ARICEPT) 10 MG tablet Take 1 tablet (10 mg total) by mouth at bedtime.  90 tablet  0  . esomeprazole (NEXIUM) 40 MG packet Take 40 mg by mouth daily before breakfast.  30 each  6  . furosemide (LASIX) 40 MG tablet Take 1 tablet (40 mg total) by mouth every morning.  30 tablet    . levothyroxine (SYNTHROID, LEVOTHROID) 137 MCG  tablet Take 137 mcg by mouth daily before breakfast.      . morphine (MSIR) 30 MG tablet Take 30 mg by mouth 3 (three) times daily as needed for pain.       Marland Kitchen oxyCODONE-acetaminophen (PERCOCET) 10-325 MG per tablet Take 1 tablet by mouth every 4 (four) hours as needed (pain).       . potassium chloride SA (K-DUR,KLOR-CON) 20 MEQ tablet Take 1 tablet (20 mEq total) by mouth daily.  30 tablet  6  . rosuvastatin (CRESTOR) 10 MG tablet Take 10 mg by mouth every other day.      . tobramycin-dexamethasone (TOBRADEX) ophthalmic ointment Place into both eyes daily.  3.5 g  0  . valsartan (DIOVAN) 320 MG tablet Take 1 tablet (320 mg total) by mouth daily.  30 tablet  6   No facility-administered medications prior to visit.          Objective:   Physical Exam Filed Vitals:   11/06/13 1032  BP: 140/80  Pulse: 67  Height: 5\' 3"  (1.6 m)  Weight: 193 lb 6.4 oz (87.726 kg)  SpO2: 99%   Gen: Pleasant, well-nourished, in no distress,  normal affect  ENT: No lesions,  mouth clear,  oropharynx clear, no postnasal drip  Neck: No JVD, no TMG, no carotid bruits  Lungs: No use of accessory muscles, no dullness to percussion, clear without rales or rhonchi  Cardiovascular: RRR, heart sounds normal, no murmur or gallops, 1-2+ LE edema  Musculoskeletal: No deformities, no cyanosis or clubbing  Neuro: alert, non focal  Skin: Warm, no lesions or rashes      Assessment & Plan:  Dyspnea Multifactorial dyspnea due to restrictive lung disease status post right upper lobe lobectomy, hypertension with diastolic dysfunction, and a possible contribution of COPD given her smoking history. Would like to recheck PFTs to assess restriction, and possible contribution of obstruction. If indicated we will do a trial of bronchodilators to see if this helps her dyspnea.

## 2013-11-12 ENCOUNTER — Encounter: Payer: Self-pay | Admitting: Nurse Practitioner

## 2013-11-12 ENCOUNTER — Ambulatory Visit (INDEPENDENT_AMBULATORY_CARE_PROVIDER_SITE_OTHER): Payer: Medicare Other | Admitting: Nurse Practitioner

## 2013-11-12 VITALS — BP 164/80 | HR 67 | Ht 63.0 in | Wt 186.0 lb

## 2013-11-12 DIAGNOSIS — R0609 Other forms of dyspnea: Secondary | ICD-10-CM

## 2013-11-12 DIAGNOSIS — E876 Hypokalemia: Secondary | ICD-10-CM

## 2013-11-12 DIAGNOSIS — R0600 Dyspnea, unspecified: Secondary | ICD-10-CM

## 2013-11-12 DIAGNOSIS — I2789 Other specified pulmonary heart diseases: Secondary | ICD-10-CM

## 2013-11-12 DIAGNOSIS — I272 Pulmonary hypertension, unspecified: Secondary | ICD-10-CM

## 2013-11-12 LAB — BASIC METABOLIC PANEL
BUN: 26 mg/dL — ABNORMAL HIGH (ref 6–23)
CO2: 30 mEq/L (ref 19–32)
Calcium: 9.3 mg/dL (ref 8.4–10.5)
Chloride: 102 mEq/L (ref 96–112)
Creatinine, Ser: 1.2 mg/dL (ref 0.4–1.2)
GFR: 45.17 mL/min — ABNORMAL LOW (ref >60.00)
Glucose, Bld: 90 mg/dL (ref 70–99)
Potassium: 3.8 mEq/L (ref 3.5–5.1)
Sodium: 140 mEq/L (ref 135–145)

## 2013-11-12 MED ORDER — FUROSEMIDE 40 MG PO TABS: 40.0000 mg | ORAL_TABLET | ORAL | Status: DC

## 2013-11-12 MED ORDER — ASPIRIN EC 81 MG PO TBEC: 81.0000 mg | DELAYED_RELEASE_TABLET | Freq: Every day | ORAL | Status: DC

## 2013-11-12 NOTE — Patient Instructions (Addendum)
Stay on your current medicines and GO BY THIS LIST  You have medicines at the drug store that need to be picked up  Start aspirin 81 mg a day  We need to check lab today  See Dr.McLean in 3 months  Call the [facility] office at [phone] if you have any questions, problems or concerns.

## 2013-11-12 NOTE — Progress Notes (Signed)
Alexa [name] Date of Birth: 16-Nov-1936 Medical Record #[id number]  History of Present Illness: Alexa [name] is seen back today for a follow up visit. She is seen for Dr. Shirlee Latch. Has non-small cell right lung cancer with prior lobectomy in the summer of 2014 per Dr. Tyrone Sage - complicated by PAF - placed on amiodarone, chronic SOB, anemia, prior breast cancer with mastectomy in 2002, HTN, venous insufficiency, remote tobacco abuse, GERD and HLD with intolerance to statins. Has been on chronic anticoagulation in the past due to DVT. She is on Aricept.   Seen back several times over the past few months for a variety of complaints - had a "jumping" in her chest. Breathing "little different". Daughter thought she had more DOE. No swelling. Weight was down over 25 pounds since last visit here. Reported appetite was down. We updated her echo and her labs - see below. Considered an event monitor but wanted to see what her labs and echo showed first.   At her last visit in October - she was doing ok - medicines were totally incorrect. She was on amiodarone - that had not been documented in her chart at all. Out of her thyroid medicine. Was on doxycycline for unclear reasons. We referred her on to pulmonary for chronic dyspnea which was felt to be multifactorial - lung cancer/surgery, diastolic dysfunction and pulmonary HTN. Saw Dr. Delton Coombes with the following recommendation - Multifactorial dyspnea due to restrictive lung disease status post right upper lobe lobectomy, hypertension with diastolic dysfunction, and a possible contribution of COPD given her smoking history. Would like to recheck PFTs to assess restriction, and possible contribution of obstruction. If indicated we will do a trial of bronchodilators to see if this helps her dyspnea.   Comes back today. Here alone today. Sounds like she is doing better. Her history is a little hard to follow. Says her breathing "gets a little too much". Worked in her  yard yesterday afternoon. Some swelling in her legs. ?salt use. No more palpitations. Still quite unclear about her medicines. Amiodarone back off the list.    Current Outpatient Prescriptions  Medication Sig Dispense Refill  . donepezil (ARICEPT) 10 MG tablet Take 1 tablet (10 mg total) by mouth at bedtime.  90 tablet  0  . esomeprazole (NEXIUM) 40 MG packet Take 40 mg by mouth daily before breakfast.  30 each  6  . furosemide (LASIX) 40 MG tablet Take 1 tablet (40 mg total) by mouth every morning.  30 tablet    . levothyroxine (SYNTHROID, LEVOTHROID) 137 MCG tablet Take 137 mcg by mouth daily before breakfast.      . morphine (MSIR) 30 MG tablet Take 30 mg by mouth 3 (three) times daily as needed for pain.       Marland Kitchen oxyCODONE-acetaminophen (PERCOCET) 10-325 MG per tablet Take 1 tablet by mouth every 4 (four) hours as needed (pain).       . potassium chloride SA (K-DUR,KLOR-CON) 20 MEQ tablet Take 1 tablet (20 mEq total) by mouth daily.  30 tablet  6  . rosuvastatin (CRESTOR) 10 MG tablet Take 10 mg by mouth every other day.      . tobramycin-dexamethasone (TOBRADEX) ophthalmic ointment Place into both eyes daily.  3.5 g  0  . valsartan (DIOVAN) 320 MG tablet Take 1 tablet (320 mg total) by mouth daily.  30 tablet  6   No current facility-administered medications for this visit.    Allergies  Allergen Reactions  .  Strawberry Swelling    Swelling ,rash  . Hydrocodone-Homatropine     REACTION: Nausea  . Nsaids   . Penicillins     REACTION: rash  . Simvastatin     REACTION: muscle pain    Past Medical History  Diagnosis Date  . Hyperlipidemia   . Hypertension   . Cancer     breast  . GERD (gastroesophageal reflux disease)   . Diverticulosis   . Hx of adenomatous colonic polyps   . DVT (deep venous thrombosis) 09-30-11    right lower leg   . Arthritis   . Hypothyroidism   . Dementia     sees Dr. Vickey Huger  . Peripheral vascular disease 13    leg clot? rx  . History of kidney  stones     Past Surgical History  Procedure Laterality Date  . Cholecystectomy  2007  . Breast surgery  2002    BL mastectomy  . Lumbar laminectomy      x3  . Rotator cuff repair Right 10  . Cervical spine surgery    . Hip surgery    . Spine surgery    . Abdominal hysterectomy    . Fiberoptic bronchoscopy with electromagnetic  10/07/2010  . Video bronchoscopy N/A 05/14/2013    Procedure: VIDEO BRONCHOSCOPY;  Surgeon: Delight Ovens, MD;  Location: [facility] OR;  Service: Thoracic;  Laterality: N/A;  . Video assisted thoracoscopy (vats)/ lobectomy Right 05/14/2013    Procedure: VIDEO ASSISTED THORACOSCOPY (VATS)/ LOBECTOMY;  Surgeon: Delight Ovens, MD;  Location: [facility] OR;  Service: Thoracic;  Laterality: Right;  . Colonoscopy  02-18-09    per Dr. Russella Dar, adenomatous polyp, repeat in 5 yrs     History  Smoking status  . Former Smoker -- 1.00 packs/day for 25 years  . Types: Cigarettes  . Quit date: 05/10/1980  Smokeless tobacco  . Never Used    History  Alcohol Use No    Family History  Problem Relation Age of Onset  . Diabetes Daughter   . Diabetes Daughter   . Heart disease Father   . Heart disease Brother   . Heart disease Sister   . Colon cancer Neg Hx   . Colon polyps Daughter     Review of Systems: The review of systems is per the HPI.  All other systems were reviewed and are negative.  Physical Exam: Ht 5\' 3"  (1.6 m)  Wt 186 lb (84.369 kg)  BMI 32.96 kg/m2 Patient is very pleasant and in no acute distress. Very poor historian to follow. Skin is warm and dry. Color is normal.  HEENT is unremarkable. Normocephalic/atraumatic. PERRL. Sclera are nonicteric. Neck is supple. No masses. No JVD. Lungs are clear. Cardiac exam shows a regular rate and rhythm. Abdomen is soft. Extremities are without edema. Gait and ROM are intact. No gross neurologic deficits noted.  Wt Readings from Last 3 Encounters:  11/12/13 186 lb (84.369 kg)  11/06/13 193 lb 6.4 oz (87.726 kg)    10/02/13 188 lb (85.276 kg)     LABORATORY DATA: EKG today shows NSR  Lab Results  Component Value Date   WBC 6.4 10/02/2013   HGB 12.9 10/02/2013   HCT 38.3 10/02/2013   PLT 266.0 10/02/2013   GLUCOSE 90 10/02/2013   CHOL 214* 10/02/2013   TRIG 130.0 10/02/2013   HDL 47.70 10/02/2013   LDLDIRECT 148.4 10/02/2013   LDLCALC 123* 03/02/2012   ALT 10 10/02/2013   AST 19 10/02/2013   NA  141 10/02/2013   K 3.7 10/02/2013   CL 106 10/02/2013   CREATININE 1.1 10/02/2013   BUN 23 10/02/2013   CO2 28 10/02/2013   TSH 3.46 10/02/2013   INR 0.91 05/10/2013   Echo Study Conclusions from October 2014  - Left ventricle: The cavity size was normal. Wall thickness was normal. Systolic function was normal. The estimated ejection fraction was in the range of 55% to 60%. Wall motion was normal; there were no regional wall motion abnormalities. Features are consistent with a pseudonormal left ventricular filling pattern, with concomitant abnormal relaxation and increased filling pressure (grade 2 diastolic dysfunction). - Aortic valve: There was no stenosis. - Mitral valve: Mildly calcified annulus. Normal thickness leaflets . Trivial regurgitation. - Left atrium: The atrium was mildly dilated. - Right ventricle: The cavity size was normal. Systolic function was normal. - Tricuspid valve: Peak RV-RA gradient:34mm Hg (S). - Pulmonary arteries: PA systolic pressure 53-57 mmHg. - Systemic veins: IVC measured 2.1 cm with normal respirophasic variation, suggesting RA pressure 6-10 mmHg.    Assessment / Plan: 1. Dyspnea - seems like she has improved. For repeat PFTs and follow up with Dr. Solon Augusta. Have called the drug store - she has not picked up her Amiodarone since September - will go ahead and discontinue this with the drug store.  2. PAF - was on amiodarone - has not picked up since September according to the pharmacy  3. CKD - recheck BMET today  4. Palpitations - not a problem at this time  5.  HTN - BP is up - the drug store says her Valsartan has been ready to be picked up for several days. I suspect this is the reason for her elevated BP.   6. Dementia/memory disorder   7. HLD - has not picked up her Crestor as well at the drug store.  Patient is agreeable to this plan and will call if any problems develop in the interim.   Rosalio Macadamia, RN, ANP-C [facility] [address] [place], [place]  [address]

## 2013-12-12 ENCOUNTER — Other Ambulatory Visit: Payer: Self-pay | Admitting: Emergency Medicine

## 2013-12-12 DIAGNOSIS — R0989 Other specified symptoms and signs involving the circulatory and respiratory systems: Principal | ICD-10-CM

## 2013-12-12 DIAGNOSIS — R0609 Other forms of dyspnea: Secondary | ICD-10-CM

## 2013-12-13 ENCOUNTER — Ambulatory Visit: Payer: Medicare Other | Admitting: Emergency Medicine

## 2013-12-18 ENCOUNTER — Telehealth: Payer: Self-pay | Admitting: Neurology

## 2013-12-18 NOTE — Telephone Encounter (Signed)
Wants an appt for her mother because her health is getting worst.

## 2013-12-18 NOTE — Telephone Encounter (Signed)
Spoke with daughter and she said that mother is more confusing, putting things were they don't belong, can't hold a conversation, needs an appt asap

## 2013-12-19 NOTE — Telephone Encounter (Signed)
Patient has been scheduled, confirmed with daughter

## 2013-12-21 ENCOUNTER — Encounter: Payer: Self-pay | Admitting: Gastroenterology

## 2013-12-27 ENCOUNTER — Encounter: Payer: Self-pay | Admitting: Neurology

## 2013-12-28 ENCOUNTER — Other Ambulatory Visit: Payer: Self-pay | Admitting: Family Medicine

## 2014-01-09 ENCOUNTER — Encounter: Payer: Self-pay | Admitting: Neurology

## 2014-01-09 ENCOUNTER — Ambulatory Visit (INDEPENDENT_AMBULATORY_CARE_PROVIDER_SITE_OTHER): Payer: Medicare Other | Admitting: Neurology

## 2014-01-09 VITALS — BP 172/78 | HR 67 | Resp 18 | Ht 62.5 in | Wt 196.0 lb

## 2014-01-09 DIAGNOSIS — F0150 Vascular dementia without behavioral disturbance: Secondary | ICD-10-CM

## 2014-01-09 MED ORDER — DONEPEZIL HCL 10 MG PO TABS: 10.0000 mg | ORAL_TABLET | Freq: Every day | ORAL | Status: DC

## 2014-01-09 NOTE — Progress Notes (Addendum)
[facility]  Provider:  Larey Seat, M D  Referring Provider: Laurey Morale, MD Primary Care Physician:  Laurey Morale, MD  Chief Complaint  Patient presents with  . Follow-up    Room 10  . Dementia    MMSE- 18/30 AFT-2    HPI:  Alexa [name] is a 78 y.o. female  Is seen here as a referral/ revisit  from Dr. Sarajane Jews,  her PCP, for progressive memory decline.   Mrs. Azad was last seen by Dr. Laurance Flatten on 09-06-12 and her previous visit with me was on 09-21-11 the patient is again today here accompanied by her daughters were concerned about the last months when her memory loss seems to have significantly progressed and  declined.  In 2012 and 2013  She noted  difficulties with naming objects and remembering names mainly. She does not was not anxious or depressed at all and she appears to be in a fairly normal blood today.  The patient has a history of breast cancer with a double mastectomy for more than 6 years ago and in 2014 last year to this office was diagnosed with a non-small cell cancer of the right lung which has been surgically removed and did not require chemotherapy or radiation. But she has done physically all right except for joint pain and aches and pains there has been cognitively a lot of change.  Please note that in August 2013 an MRI of the brain had short shoulder and a subcortical small vessel disease and an enhancing lesion in the right frontal region. It was interpreted as a right frontal developmental venous normality.   Mrs Allmon has become absent minded, and often seems to stare into space.  She has some twitching , and dropped objects. She is sleeping a lot more than last year, falls asleep immediately when not physically active or mentally stimulated. Her children state she " always smiles' and is in good spirits. Her sleep is "good" and her apetite is good, according to the patient. She is cared for by her daughters. Significant ankle edema  with dystrophic skin is noted.   Review of Systems: Out of a complete 14 system review, the patient complains of only the following symptoms, and all other reviewed systems are negative. Memory loss, MMSE now 18 points.  Ankle edema the signal significant skin changes dystrophic skin changes induration of the skin, puffiness up to the knee level she had suffered a blood clot in 2012 in her left knee and she had a poorly healing wound. There has been no significant ataxia no altered nor orthostatic dizziness or lightheadedness with postural changes. The daughters have noted that she used to have a variability of cognitive function day by day -which changed about 2 months ago.  Now , she is seems to have a more decline in no longer the good days.   Stress is present as her daughter is awaiting a liver transplant. The patient is a main caretaker !    History   Social History  . Marital Status: Widowed    Spouse Name: N/A    Number of Children: 3  . Years of Education: 11   Occupational History  .     Social History Main Topics  . Smoking status: Former Smoker -- 1.00 packs/day for 25 years    Types: Cigarettes    Quit date: 05/10/1980  . Smokeless tobacco: Never Used  . Alcohol Use: No  . Drug Use: No  .  Sexual Activity: No   Other Topics Concern  . Not on file   Social History Narrative   Patient is widowed.   Patient is right-handed.   Patient is retired.   Patient has an 11th grade education.   Patient has three daughters.    Family History  Problem Relation Age of Onset  . Diabetes Daughter   . Diabetes Daughter   . Heart disease Father   . Heart disease Brother   . Heart disease Sister   . Colon cancer Neg Hx   . Colon polyps Daughter   . Stroke Mother     Past Medical History  Diagnosis Date  . Hyperlipidemia   . Hypertension   . Cancer     breast  . GERD (gastroesophageal reflux disease)   . Diverticulosis   . Hx of adenomatous colonic polyps   .  DVT (deep venous thrombosis) 09-30-11    right lower leg   . Arthritis   . Hypothyroidism   . Dementia     sees Dr. Brett Fairy  . Peripheral vascular disease 13    leg clot? rx  . History of kidney stones   . DDD (degenerative disc disease)   . Asthma   . H/O vein stripping     Past Surgical History  Procedure Laterality Date  . Cholecystectomy  2007  . Breast surgery  2002    BL mastectomy  . Lumbar laminectomy      x3  . Rotator cuff repair Right 10  . Cervical spine surgery    . Hip surgery    . Spine surgery    . Abdominal hysterectomy    . Fiberoptic bronchoscopy with electromagnetic  10/07/2010  . Video bronchoscopy N/A 05/14/2013    Procedure: VIDEO BRONCHOSCOPY;  Surgeon: Grace Isaac, MD;  Location: [facility] OR;  Service: Thoracic;  Laterality: N/A;  . Video assisted thoracoscopy (vats)/ lobectomy Right 05/14/2013    Procedure: VIDEO ASSISTED THORACOSCOPY (VATS)/ LOBECTOMY;  Surgeon: Grace Isaac, MD;  Location: [facility];  Service: Thoracic;  Laterality: Right;  . Colonoscopy  02-18-09    per Dr. Fuller Plan, adenomatous polyp, repeat in 5 yrs   . Neck-acdf    . Varicose vein surgery    . Hemorrhoid surgery  1985  . Kidney stone surgery  1987    Current Outpatient Prescriptions  Medication Sig Dispense Refill  . aspirin EC 81 MG tablet Take 1 tablet (81 mg total) by mouth daily.  90 tablet  3  . donepezil (ARICEPT) 10 MG tablet Take 1 tablet (10 mg total) by mouth at bedtime.  90 tablet  0  . esomeprazole (NEXIUM) 40 MG packet Take 40 mg by mouth daily before breakfast.  30 each  6  . furosemide (LASIX) 40 MG tablet Take 1 tablet (40 mg total) by mouth every morning.  30 tablet  6  . levothyroxine (SYNTHROID, LEVOTHROID) 137 MCG tablet TAKE 1 TABLET (137 MCG TOTAL) BY MOUTH DAILY.  30 tablet  8  . morphine (MSIR) 30 MG tablet Take 30 mg by mouth 3 (three) times daily as needed for pain.       Marland Kitchen oxyCODONE-acetaminophen (PERCOCET) 10-325 MG per tablet Take 1 tablet by mouth  every 4 (four) hours as needed (pain).       . potassium chloride SA (K-DUR,KLOR-CON) 20 MEQ tablet Take 1 tablet (20 mEq total) by mouth daily.  30 tablet  6  . tobramycin-dexamethasone (TOBRADEX) ophthalmic ointment Place into both eyes  daily. As needed      . valsartan (DIOVAN) 320 MG tablet Take 1 tablet (320 mg total) by mouth daily.  30 tablet  6   No current facility-administered medications for this visit.    Allergies as of 01/09/2014 - Review Complete 01/09/2014  Allergen Reaction Noted  . Strawberry Swelling 09/15/2011  . Hydrocodone-homatropine  06/06/2007  . Nsaids  08/25/2011  . Penicillins  03/06/2010  . Simvastatin  06/06/2007    Vitals: BP 172/78  Pulse 67  Resp 18  Ht 5' 2.5" (1.588 m)  Wt 196 lb (88.905 kg)  BMI 35.26 kg/m2 Last Weight:  Wt Readings from Last 1 Encounters:  01/09/14 196 lb (88.905 kg)   Last Height:   Ht Readings from Last 1 Encounters:  01/09/14 5' 2.5" (1.588 m)    Physical exam:  General: The patient is awake, alert and appears not in acute distress. The patient is groomed.  She is friendly and cooperative , but significantly impaired by cognitive decline.  Head: Normocephalic, atraumatic. Neck is supple. Mallampati 3, neck circumference: 13.25  Cardiovascular:  Regular rate and rhythm , without  murmurs or carotid bruit, and without distended neck veins. Respiratory: Lungs are clear to auscultation. Skin:  Without evidence of edema, or rash Trunk: BMI is elevated,  The  patient  has normal posture.  Neurologic exam : The patient is awake but poorly  oriented to place and time.   Memory subjective described as impaired- her MMSE is down to 18 points, . There is a limited  attention span & concentration ability. Speech is fluent without dysarthria, dysphonia or aphasia but she perseverates .  Mood and affect are appropriate. She begun crying when talking about her daughters disease.   Cranial nerves: Pupils are equal and briskly  reactive to light. Funduscopic exam without  evidence of pallor or edema. Extraocular movements  in vertical and horizontal planes intact and without nystagmus. Visual fields by finger perimetry are intact. Hearing to finger rub intact.  Facial sensation intact to fine touch. Facial motor strength is symmetric and tongue and uvula move midline.  Motor exam:   Normal tone and normal muscle bulk . The patient presents with some proximal weakness in both lower extremities which is not present in the upper extremities or her thigh adduction abduction and flexion seems to be weaker in the same is true for the neck extension and flexion and dorsiflexion and plantar flexion at the ankle. Please not the edema which may contribute to the ankles limited range of motion.  Sensory:  Fine touch, pinprick and vibration were tested in all extremities. Vibration and fine filament touch is impaired by edema. She also has resolved a wide based gait after suffering more back pain. When leaning on a shopping cart she feels better.  Proprioception is impaired.  Coordination: Rapid alternating movements in the fingers/hands is tested and normal. Finger-to-nose maneuver tested and normal with evidence of right to left confusion, finger anosognosia and Tremor,  There is gegenhalten.  Gait and station: Patient walks without assistive device and is able and assisted stool climb up to the exam table. Strength within normal limits. Stance is wide based .   Assessment:  After physical and neurologic examination, review of laboratory studies, imaging, neurophysiology testing and pre-existing records, assessment is   Progressive cognitive decline, but in the frame of decreased memory and word finding capacity in addition there is some right to left confusion, some apraxia and some nausea. Of these  can be finding seen in a regular Alzheimer's dementia over I am also worried about a right parietal or callosal lesion given the Gerstmann  syndrome.  Plan:  Treatment plan and additional workup : MRi brain, evaluate for progressive dementia with apraxia, agnosia and left right confusion.   Aricept, refilled.  EEG ordered.            The patients daughter spoke to me after the visit, their mother has been "night active " , working in the kitchen , and has made attempts to drive . They have hidden the car keys.  She has a history of bilateral breast cancer and one lung cancer, the MRI was ordered to evalaute , but non contrast due to her fluid retention and frail kidney function.

## 2014-01-10 ENCOUNTER — Ambulatory Visit (INDEPENDENT_AMBULATORY_CARE_PROVIDER_SITE_OTHER): Payer: Medicare Other | Admitting: Emergency Medicine

## 2014-01-10 ENCOUNTER — Ambulatory Visit: Payer: Medicare Other | Admitting: Emergency Medicine

## 2014-01-10 DIAGNOSIS — R0609 Other forms of dyspnea: Secondary | ICD-10-CM

## 2014-01-10 DIAGNOSIS — R0989 Other specified symptoms and signs involving the circulatory and respiratory systems: Principal | ICD-10-CM

## 2014-01-10 LAB — PULMONARY FUNCTION TEST
DL/VA % PRED: 100 %
DL/VA: 4.47 ml/min/mmHg/L
DLCO UNC: 16.81 ml/min/mmHg
DLCO unc % pred: 80 %
FEF 25-75 Post: 2.02 L/sec
FEF 25-75 Pre: 1.34 L/sec
FEF2575-%Change-Post: 50 %
FEF2575-%PRED-PRE: 90 %
FEF2575-%Pred-Post: 136 %
FEV1-%Change-Post: 10 %
FEV1-%Pred-Post: 103 %
FEV1-%Pred-Pre: 93 %
FEV1-Post: 1.91 L
FEV1-Pre: 1.72 L
FEV1FVC-%Change-Post: 5 %
FEV1FVC-%Pred-Pre: 100 %
FEV6-%Change-Post: 5 %
FEV6-%PRED-POST: 102 %
FEV6-%PRED-PRE: 97 %
FEV6-PRE: 2.3 L
FEV6-Post: 2.42 L
FEV6FVC-%PRED-POST: 105 %
FEV6FVC-%Pred-Pre: 105 %
FVC-%Change-Post: 5 %
FVC-%PRED-POST: 97 %
FVC-%Pred-Pre: 92 %
FVC-POST: 2.42 L
FVC-PRE: 2.3 L
POST FEV6/FVC RATIO: 100 %
Post FEV1/FVC ratio: 79 %
Pre FEV1/FVC ratio: 75 %
Pre FEV6/FVC Ratio: 100 %
RV % pred: 68 %
RV: 1.49 L
TLC % PRED: 84 %
TLC: 3.95 L

## 2014-01-10 NOTE — Progress Notes (Signed)
PFT done today. 

## 2014-01-16 ENCOUNTER — Ambulatory Visit: Payer: Medicare Other | Admitting: Emergency Medicine

## 2014-01-21 ENCOUNTER — Inpatient Hospital Stay: Admission: RE | Admit: 2014-01-21 | Payer: Medicare Other | Source: Ambulatory Visit

## 2014-01-22 ENCOUNTER — Inpatient Hospital Stay: Admission: RE | Admit: 2014-01-22 | Payer: Medicare Other | Source: Ambulatory Visit

## 2014-01-26 ENCOUNTER — Encounter: Payer: Self-pay | Admitting: Oncology

## 2014-01-27 DIAGNOSIS — I2699 Other pulmonary embolism without acute cor pulmonale: Secondary | ICD-10-CM

## 2014-01-27 HISTORY — DX: Other pulmonary embolism without acute cor pulmonale: I26.99

## 2014-01-27 HISTORY — PX: VENA CAVA FILTER PLACEMENT: SUR1032

## 2014-01-29 ENCOUNTER — Ambulatory Visit
Admission: RE | Admit: 2014-01-29 | Discharge: 2014-01-29 | Disposition: A | Discharge status: Home / Self Care | Payer: Medicare Other | Source: Ambulatory Visit | Attending: Neurology | Admitting: Neurology

## 2014-01-29 DIAGNOSIS — F0150 Vascular dementia without behavioral disturbance: Secondary | ICD-10-CM

## 2014-02-01 ENCOUNTER — Ambulatory Visit (INDEPENDENT_AMBULATORY_CARE_PROVIDER_SITE_OTHER): Payer: Medicare Other | Admitting: Family Medicine

## 2014-02-01 ENCOUNTER — Encounter: Payer: Self-pay | Admitting: Family Medicine

## 2014-02-01 VITALS — BP 136/84 | HR 84 | Temp 97.9°F | Ht 62.5 in | Wt 196.0 lb

## 2014-02-01 DIAGNOSIS — J019 Acute sinusitis, unspecified: Secondary | ICD-10-CM

## 2014-02-01 MED ORDER — AZITHROMYCIN 250 MG PO TABS: ORAL_TABLET | ORAL | Status: DC

## 2014-02-01 MED ORDER — METHYLPREDNISOLONE ACETATE 80 MG/ML IJ SUSP
120.0000 mg | Freq: Once | INTRAMUSCULAR | Status: AC
  Administered 2014-02-01: 120 mg via INTRAMUSCULAR

## 2014-02-01 NOTE — Progress Notes (Signed)
Pre visit review using our clinic review tool, if applicable. No additional management support is needed unless otherwise documented below in the visit note. 

## 2014-02-01 NOTE — Addendum Note (Signed)
Addended by: Aggie Hacker A on: 02/01/2014 03:04 PM   Modules accepted: Orders

## 2014-02-01 NOTE — Progress Notes (Signed)
   Subjective:    Patient ID: Alexa [name], female    DOB: [birth year], 78 y.o.   MRN: [id number]  HPI Here for 2 days of sinus pressure, PND, and coughing up green sputum. No fever.    Review of Systems  Constitutional: Negative.   HENT: Positive for congestion, postnasal drip and sinus pressure.   Eyes: Negative.   Respiratory: Positive for cough.        Objective:   Physical Exam  Constitutional: She appears well-developed and well-nourished.  HENT:  Right Ear: External ear normal.  Left Ear: External ear normal.  Nose: Nose normal.  Mouth/Throat: Oropharynx is clear and moist.  Eyes: Conjunctivae are normal.  Pulmonary/Chest: Effort normal and breath sounds normal.  Lymphadenopathy:    She has no cervical adenopathy.          Assessment & Plan:  Add Mucinex

## 2014-02-11 ENCOUNTER — Encounter: Payer: Self-pay | Admitting: *Deleted

## 2014-02-11 ENCOUNTER — Other Ambulatory Visit: Payer: Self-pay | Admitting: Oncology

## 2014-02-11 ENCOUNTER — Other Ambulatory Visit: Payer: Self-pay | Admitting: *Deleted

## 2014-02-11 DIAGNOSIS — C3491 Malignant neoplasm of unspecified part of right bronchus or lung: Secondary | ICD-10-CM

## 2014-02-11 NOTE — [facility] Oncology Navigator Note (Signed)
I called patient to check in.  Patient with her oldest daughter at [facility] waiting while her daughter [name] is having a procedure.  Patient unable to talk at this time however her daughter reports that she has been doing fairly well.  She did not report any complaints or concerns at this time.  She asked if her mother should see Dr. Beryle Beams before he leaves [facility] so I followed up with Janifer Adie, Dr. Azucena Freed nurse who consulted Dr. Beryle Beams.  Orders placed for chest CT to evaluate her lungs.  Patient scheduled to transfer to Dr. Humphrey Rolls and appointment scheduled for May.  I called patient back and again spoke to her daughter and informed her that she would be receiving a call with information about these tests.  I encouraged her to have Ms. Rambeau call me for any questions, needs or concerns.

## 2014-02-12 ENCOUNTER — Ambulatory Visit: Payer: Medicare Other | Admitting: Cardiology

## 2014-02-12 ENCOUNTER — Telehealth: Payer: Self-pay | Admitting: Oncology

## 2014-02-12 ENCOUNTER — Telehealth: Payer: Self-pay | Admitting: Hematology & Oncology

## 2014-02-12 NOTE — Telephone Encounter (Signed)
s.w. pt daughter and advised on Dr. Marin Olp appt per pt request

## 2014-02-12 NOTE — Telephone Encounter (Signed)
Scheduling called scheduled 7-6 appointment with Dr. Marin Olp said pt was switching to [place]. RN aware and will check with Dr. Marin Olp

## 2014-02-12 NOTE — Telephone Encounter (Signed)
s.w. pt daughter and advised on all appts...Marland Kitchenok and aware

## 2014-02-13 ENCOUNTER — Encounter: Payer: Self-pay | Admitting: Cardiology

## 2014-02-13 ENCOUNTER — Ambulatory Visit (INDEPENDENT_AMBULATORY_CARE_PROVIDER_SITE_OTHER): Payer: Medicare Other | Admitting: Cardiology

## 2014-02-13 VITALS — BP 132/78 | HR 64 | Ht 62.5 in | Wt 190.0 lb

## 2014-02-13 DIAGNOSIS — I4891 Unspecified atrial fibrillation: Secondary | ICD-10-CM

## 2014-02-13 DIAGNOSIS — I10 Essential (primary) hypertension: Secondary | ICD-10-CM

## 2014-02-13 DIAGNOSIS — R0989 Other specified symptoms and signs involving the circulatory and respiratory systems: Principal | ICD-10-CM

## 2014-02-13 DIAGNOSIS — R0609 Other forms of dyspnea: Secondary | ICD-10-CM

## 2014-02-13 DIAGNOSIS — R0600 Dyspnea, unspecified: Secondary | ICD-10-CM

## 2014-02-13 DIAGNOSIS — I2789 Other specified pulmonary heart diseases: Secondary | ICD-10-CM

## 2014-02-13 LAB — BASIC METABOLIC PANEL
BUN: 19 mg/dL (ref 6–23)
CO2: 33 meq/L — AB (ref 19–32)
CREATININE: 1 mg/dL (ref 0.4–1.2)
Calcium: 9.2 mg/dL (ref 8.4–10.5)
Chloride: 102 mEq/L (ref 96–112)
GFR: 55.4 mL/min — ABNORMAL LOW (ref >60.00)
Glucose, Bld: 95 mg/dL (ref 70–99)
Potassium: 3.8 mEq/L (ref 3.5–5.1)
Sodium: 142 mEq/L (ref 135–145)

## 2014-02-13 LAB — BRAIN NATRIURETIC PEPTIDE: Pro B Natriuretic peptide (BNP): 212 pg/mL — ABNORMAL HIGH (ref 0.0–100.0)

## 2014-02-13 MED ORDER — POTASSIUM CHLORIDE CRYS ER 20 MEQ PO TBCR: 20.0000 meq | EXTENDED_RELEASE_TABLET | Freq: Two times a day (BID) | ORAL | Status: DC

## 2014-02-13 MED ORDER — FUROSEMIDE 40 MG PO TABS: ORAL_TABLET | ORAL | Status: DC

## 2014-02-13 NOTE — Patient Instructions (Signed)
Increase lasix to 40mg  AM and 20mg  PM. This will be 1 of your 40mg  tablets AM and 1/2 of your 40mg  tablets PM.   Increase KCL (potassium) to 20 mEq two times a day.   Your physician recommends that you have  lab work today--BMET/BNP.  Your physician recommends that you return for lab work in: 2 weeks--BMET.  Your physician recommends that you schedule a follow-up appointment in: 3 months with Dr Aundra Dubin.

## 2014-02-14 ENCOUNTER — Encounter (HOSPITAL_COMMUNITY): Payer: Self-pay

## 2014-02-14 ENCOUNTER — Ambulatory Visit (HOSPITAL_COMMUNITY)
Admission: RE | Admit: 2014-02-14 | Discharge: 2014-02-14 | Disposition: A | Discharge status: Home / Self Care | Payer: Medicare Other | Source: Ambulatory Visit | Attending: Oncology | Admitting: Oncology

## 2014-02-14 ENCOUNTER — Other Ambulatory Visit (HOSPITAL_BASED_OUTPATIENT_CLINIC_OR_DEPARTMENT_OTHER): Payer: Medicare Other

## 2014-02-14 DIAGNOSIS — Z9010 Acquired absence of unspecified breast and nipple: Secondary | ICD-10-CM | POA: Insufficient documentation

## 2014-02-14 DIAGNOSIS — C3490 Malignant neoplasm of unspecified part of unspecified bronchus or lung: Secondary | ICD-10-CM

## 2014-02-14 DIAGNOSIS — Z853 Personal history of malignant neoplasm of breast: Secondary | ICD-10-CM | POA: Insufficient documentation

## 2014-02-14 DIAGNOSIS — Z923 Personal history of irradiation: Secondary | ICD-10-CM | POA: Insufficient documentation

## 2014-02-14 DIAGNOSIS — C3491 Malignant neoplasm of unspecified part of right bronchus or lung: Secondary | ICD-10-CM

## 2014-02-14 LAB — CBC WITH DIFFERENTIAL/PLATELET
BASO%: 0.9 % (ref 0.0–2.0)
Basophils Absolute: 0.1 10*3/uL (ref 0.0–0.1)
EOS%: 2.7 % (ref 0.0–7.0)
Eosinophils Absolute: 0.2 10*3/uL (ref 0.0–0.5)
HCT: 40.7 % (ref 34.8–46.6)
HGB: 13.6 g/dL (ref 11.6–15.9)
LYMPH%: 19.9 % (ref 14.0–49.7)
MCH: 29 pg (ref 25.1–34.0)
MCHC: 33.4 g/dL (ref 31.5–36.0)
MCV: 86.9 fL (ref 79.5–101.0)
MONO#: 0.4 10*3/uL (ref 0.1–0.9)
MONO%: 5.3 % (ref 0.0–14.0)
NEUT#: 5.2 10*3/uL (ref 1.5–6.5)
NEUT%: 71.2 % (ref 38.4–76.8)
PLATELETS: 283 10*3/uL (ref 145–400)
RBC: 4.69 10*6/uL (ref 3.70–5.45)
RDW: 14.3 % (ref 11.2–14.5)
WBC: 7.3 10*3/uL (ref 3.9–10.3)
lymph#: 1.5 10*3/uL (ref 0.9–3.3)

## 2014-02-14 LAB — COMPREHENSIVE METABOLIC PANEL (CC13)
ALT: 15 U/L (ref 0–55)
AST: 22 U/L (ref 5–34)
Albumin: 4 g/dL (ref 3.5–5.0)
Alkaline Phosphatase: 104 U/L (ref 40–150)
Anion Gap: 11 mEq/L (ref 3–11)
BILIRUBIN TOTAL: 1.04 mg/dL (ref 0.20–1.20)
BUN: 19.7 mg/dL (ref 7.0–26.0)
CO2: 28 mEq/L (ref 22–29)
Calcium: 9.6 mg/dL (ref 8.4–10.4)
Chloride: 106 mEq/L (ref 98–109)
Creatinine: 1 mg/dL (ref 0.6–1.1)
Glucose: 101 mg/dl (ref 70–140)
Potassium: 3.7 mEq/L (ref 3.5–5.1)
SODIUM: 145 meq/L (ref 136–145)
TOTAL PROTEIN: 7.9 g/dL (ref 6.4–8.3)

## 2014-02-14 LAB — LACTATE DEHYDROGENASE (CC13): LDH: 239 U/L (ref 125–245)

## 2014-02-14 MED ORDER — IOHEXOL 300 MG/ML  SOLN
80.0000 mL | Freq: Once | INTRAMUSCULAR | Status: AC | PRN
  Administered 2014-02-14: 80 mL via INTRAVENOUS

## 2014-02-14 NOTE — Progress Notes (Signed)
Patient ID: Alexa [name], female   DOB: 1936-04-25, 78 y.o.   MRN: [id number]  Subjective:    Patient ID: Alexa [name], female    DOB: 05-15-1936, 78 y.o.   MRN: [id number] PCP: Dr. Sarajane Jews  78 yo with history of diastolic CHF, dementia, paroxysmal atrial fibrillation, prior DVT, and lung cancer presents for cardiology followup.  Last echo in 10/14 showed normal EF with moderate diastolic dysfunction and moderate pulmonary hypertension.  She has stable exertional dyspnea.  She is short of breath walking down the hall at her house.  No chest pain.  She sleeps in a chair, but because of her back and not because of orthopnea.  She has been taking Lasix 40 mg daily.    She had paroxysmal atrial fibrillation in the setting of her right upper lobectomy in 2014.  She was on amiodarone afterwards but stopped it and is not sure why.  She is no longer on coumadin either.  No tachypalpitations.   Labs (9/12): K 4, creatinine 0.9, BNP 82, LDL 163, HDL 53, TGs 80 Labs (11/12): K 3.8, creatinine 0.91 Labs (12/14): K 3.8, creatinine 1.2  PMH: 1. hypertension 2. Hyperlipidemia: Myalgias with multiple statins.  3. hypothyroidism 4. GERD 5. history of tobacco abuse; quit 25+ years ago.  PFTs in 2/15 were relatively normal.  5. Breast adenocarcinoma s/p right simple and left modified radical mastectomy with chemotherapy and radiation in 2002.  6. cervical and lumbar herniation and DJD s/p multiple diskectomies and other back surgeries from 2002-2008 - chronic back pain being followed at the pain clinic 7. Diastolic CHF: Echo (8/33) with EF 60-65%, mild MR, moderate TR, PA systolic pressure 48 mmHg.  ETT-myoview (9/12) with 4' exercise, EF 68%, no ischemia or infarction.  Echo (101/4) with EF 55-60%, moderate diastolic dysfunction, PA systolic pressure 82-50 mmHg, normal RV size and systolic function.  8. Venous insufficiency with venous stasis ulceration.  9. DVT right lower leg 11/12 10. Paroxysmal atrial  fibrillation: Only episode that noted was in setting of right upper lobectomy in 2014.  She was briefly on amiodarone but stopped it on her own.  She is not coumadin.  11. Non-small cell lung cancer: s/p right upper lobectomy in 2014.  12. Dementia: On Aricept.  FHx: Mother died of stroke at age 61. Father died of heart attack at age 72. Sister had breast cancer.  SH:  Prior smoker. Lives in [place] with family.   ROS: All systems reviewed and negative except as per HPI.   Current Outpatient Prescriptions  Medication Sig Dispense Refill  . aspirin EC 81 MG tablet Take 1 tablet (81 mg total) by mouth daily.  90 tablet  3  . donepezil (ARICEPT) 10 MG tablet Take 1 tablet (10 mg total) by mouth at bedtime.  90 tablet  0  . esomeprazole (NEXIUM) 40 MG packet Take 40 mg by mouth daily before breakfast.  30 each  6  . levothyroxine (SYNTHROID, LEVOTHROID) 137 MCG tablet TAKE 1 TABLET (137 MCG TOTAL) BY MOUTH DAILY.  30 tablet  8  . morphine (MSIR) 30 MG tablet Take 30 mg by mouth 3 (three) times daily as needed for pain.       Marland Kitchen oxyCODONE-acetaminophen (PERCOCET) 10-325 MG per tablet Take 1 tablet by mouth every 4 (four) hours as needed (pain).       Marland Kitchen tobramycin-dexamethasone (TOBRADEX) ophthalmic ointment Place into both eyes daily. As needed      . valsartan (DIOVAN) 320 MG  tablet Take 1 tablet (320 mg total) by mouth daily.  30 tablet  6  . furosemide (LASIX) 40 MG tablet 1 tablet (40mg ) AM and 1/2 tablet (20mg ) PM  45 tablet  4  . potassium chloride SA (K-DUR,KLOR-CON) 20 MEQ tablet Take 1 tablet (20 mEq total) by mouth 2 (two) times daily.  60 tablet  4   No current facility-administered medications for this visit.    BP 132/78  Pulse 64  Ht 5' 2.5" (1.588 m)  Wt 86.183 kg (190 lb)  BMI 34.18 kg/m2 General: NAD, overweight Neck: JVP 7-8 cm, no thyromegaly or thyroid nodule.  Lungs: Clear to auscultation bilaterally with normal respiratory effort. CV: Nondisplaced PMI.  Heart  regular S1/S2, no S3/S4, no murmur.  1+ bilateral ankle edema. Bilateral lower leg venous varicosities.  No carotid bruit.   Abdomen: Soft, nontender, no hepatosplenomegaly, no distention.  Neurologic: Alert and oriented x 3.  Psych: Anxious Extremities: No clubbing or cyanosis.   Assessment/Plan: 1. Exertional dyspnea: NYHA class III symptoms, relatively stable.  Suspect this is multifactorial from diastolic CHF, deconditioning, restriction post-lung surgery.  She probably is mildly volume overloaded.  - Increase lasix to 40 qam, 20 qpm and increase KCl to 40 mEq daily.  - Check BMET/BNP today and in 2 wks.  2. Atrial fibrillation: 1 documented episode post-op lobectomy.  No tachypalpitations.  She is no longer taking either amiodarone or coumadin.  Not sure if she stopped these on her own or was told to stop them.  If she has recurrent atrial fibrillation, will need to consider anticoagulation.  3. HTN: BP controlled on valsartan.   Loralie Champagne 02/14/2014

## 2014-02-18 ENCOUNTER — Telehealth: Payer: Self-pay | Admitting: Neurology

## 2014-02-18 NOTE — Telephone Encounter (Signed)
Spoke with patient's daughter and she said that the CT question was meant for her cardiologist but was very sad about mother not recognizing the family for the first time today, would like to know if there are any support groups that they may contact for the family. Had to cancel the scheduled MRI, was too closed in for patient, may have to sedate patient or have the open MRI

## 2014-02-18 NOTE — Telephone Encounter (Signed)
Calling confused about mother having an MRI. She could not go through with it and doesn't remember if a CT was ordered. She started crying saying her mother doesn't know who she is for the first time today please call

## 2014-02-20 NOTE — Telephone Encounter (Signed)
Patient's daughter called to state that patient is declining very fast and she would like an appointment with Dr. Brett Fairy so she can be seen. Please call and advise patient.

## 2014-02-20 NOTE — Telephone Encounter (Signed)
Please move up her followup appointment with Jeani Hawking in her next available, make sure that Dr. Keturah Barre is at office that day too.

## 2014-02-21 ENCOUNTER — Telehealth: Payer: Self-pay | Admitting: *Deleted

## 2014-02-21 NOTE — Telephone Encounter (Addendum)
Called and scheduled and confirmed appt with patient's sister(LIsa), she also would like Dr Dohmeier to know that patient could not go through with the open or closed MRI, could she be sedated to have precedure done?

## 2014-02-21 NOTE — Telephone Encounter (Signed)
Notified daughter, [name] of good CT report per Dr Azucena Freed instructions. [name] reports that her mother has alzheimer's.

## 2014-02-21 NOTE — Telephone Encounter (Signed)
Message copied by Jesse Fall on Thu Feb 21, 2014  3:10 PM ------      Message from: Annia Belt      Created: Fri Feb 15, 2014  6:43 PM       Please call pt:  CT no evidence for recurrent lung cancer ------

## 2014-02-21 NOTE — Telephone Encounter (Signed)
Pt's daughter called back was asking for Tye Maryland, she thought that is what they left on the machine. I advised Tye Maryland was with a pt at this time and I would have someone to call her back. Please call [name] back concerning pt. Thanks

## 2014-02-25 ENCOUNTER — Emergency Department (HOSPITAL_COMMUNITY): Payer: Medicare Other

## 2014-02-25 ENCOUNTER — Inpatient Hospital Stay (HOSPITAL_COMMUNITY)
Admission: EM | Admit: 2014-02-25 | Discharge: 2014-03-04 | DRG: 853 | Disposition: A | Discharge status: Home Health | Payer: Medicare Other | Attending: Internal Medicine | Admitting: Internal Medicine

## 2014-02-25 ENCOUNTER — Encounter (HOSPITAL_COMMUNITY): Payer: Self-pay | Admitting: Emergency Medicine

## 2014-02-25 DIAGNOSIS — E876 Hypokalemia: Secondary | ICD-10-CM

## 2014-02-25 DIAGNOSIS — I4891 Unspecified atrial fibrillation: Secondary | ICD-10-CM

## 2014-02-25 DIAGNOSIS — F0390 Unspecified dementia without behavioral disturbance: Secondary | ICD-10-CM

## 2014-02-25 DIAGNOSIS — Z860100 Personal history of colon polyps, unspecified: Secondary | ICD-10-CM

## 2014-02-25 DIAGNOSIS — I472 Ventricular tachycardia, unspecified: Secondary | ICD-10-CM

## 2014-02-25 DIAGNOSIS — R0989 Other specified symptoms and signs involving the circulatory and respiratory systems: Secondary | ICD-10-CM

## 2014-02-25 DIAGNOSIS — Z923 Personal history of irradiation: Secondary | ICD-10-CM

## 2014-02-25 DIAGNOSIS — I5032 Chronic diastolic (congestive) heart failure: Secondary | ICD-10-CM

## 2014-02-25 DIAGNOSIS — I214 Non-ST elevation (NSTEMI) myocardial infarction: Secondary | ICD-10-CM | POA: Diagnosis present

## 2014-02-25 DIAGNOSIS — I82409 Acute embolism and thrombosis of unspecified deep veins of unspecified lower extremity: Secondary | ICD-10-CM

## 2014-02-25 DIAGNOSIS — E860 Dehydration: Secondary | ICD-10-CM

## 2014-02-25 DIAGNOSIS — I509 Heart failure, unspecified: Secondary | ICD-10-CM | POA: Diagnosis present

## 2014-02-25 DIAGNOSIS — Z86718 Personal history of other venous thrombosis and embolism: Secondary | ICD-10-CM

## 2014-02-25 DIAGNOSIS — R9431 Abnormal electrocardiogram [ECG] [EKG]: Secondary | ICD-10-CM

## 2014-02-25 DIAGNOSIS — M79609 Pain in unspecified limb: Secondary | ICD-10-CM

## 2014-02-25 DIAGNOSIS — S062XAA Diffuse traumatic brain injury with loss of consciousness status unknown, initial encounter: Secondary | ICD-10-CM

## 2014-02-25 DIAGNOSIS — G609 Hereditary and idiopathic neuropathy, unspecified: Secondary | ICD-10-CM

## 2014-02-25 DIAGNOSIS — I4729 Other ventricular tachycardia: Secondary | ICD-10-CM | POA: Diagnosis present

## 2014-02-25 DIAGNOSIS — E039 Hypothyroidism, unspecified: Secondary | ICD-10-CM

## 2014-02-25 DIAGNOSIS — R680 Hypothermia, not associated with low environmental temperature: Secondary | ICD-10-CM | POA: Diagnosis present

## 2014-02-25 DIAGNOSIS — W19XXXA Unspecified fall, initial encounter: Secondary | ICD-10-CM | POA: Diagnosis present

## 2014-02-25 DIAGNOSIS — R748 Abnormal levels of other serum enzymes: Secondary | ICD-10-CM

## 2014-02-25 DIAGNOSIS — Z9221 Personal history of antineoplastic chemotherapy: Secondary | ICD-10-CM

## 2014-02-25 DIAGNOSIS — I2699 Other pulmonary embolism without acute cor pulmonale: Secondary | ICD-10-CM

## 2014-02-25 DIAGNOSIS — S0633AA Contusion and laceration of cerebrum, unspecified, with loss of consciousness status unknown, initial encounter: Secondary | ICD-10-CM | POA: Diagnosis present

## 2014-02-25 DIAGNOSIS — M719 Bursopathy, unspecified: Secondary | ICD-10-CM

## 2014-02-25 DIAGNOSIS — R6520 Severe sepsis without septic shock: Secondary | ICD-10-CM

## 2014-02-25 DIAGNOSIS — R578 Other shock: Secondary | ICD-10-CM | POA: Diagnosis present

## 2014-02-25 DIAGNOSIS — J9600 Acute respiratory failure, unspecified whether with hypoxia or hypercapnia: Secondary | ICD-10-CM | POA: Diagnosis present

## 2014-02-25 DIAGNOSIS — C3490 Malignant neoplasm of unspecified part of unspecified bronchus or lung: Secondary | ICD-10-CM

## 2014-02-25 DIAGNOSIS — E785 Hyperlipidemia, unspecified: Secondary | ICD-10-CM

## 2014-02-25 DIAGNOSIS — K219 Gastro-esophageal reflux disease without esophagitis: Secondary | ICD-10-CM

## 2014-02-25 DIAGNOSIS — I959 Hypotension, unspecified: Secondary | ICD-10-CM

## 2014-02-25 DIAGNOSIS — Z902 Acquired absence of lung [part of]: Secondary | ICD-10-CM

## 2014-02-25 DIAGNOSIS — R079 Chest pain, unspecified: Secondary | ICD-10-CM

## 2014-02-25 DIAGNOSIS — R740 Nonspecific elevation of levels of transaminase and lactic acid dehydrogenase [LDH]: Secondary | ICD-10-CM

## 2014-02-25 DIAGNOSIS — Z91018 Allergy to other foods: Secondary | ICD-10-CM

## 2014-02-25 DIAGNOSIS — Z833 Family history of diabetes mellitus: Secondary | ICD-10-CM

## 2014-02-25 DIAGNOSIS — I872 Venous insufficiency (chronic) (peripheral): Secondary | ICD-10-CM | POA: Diagnosis present

## 2014-02-25 DIAGNOSIS — A419 Sepsis, unspecified organism: Principal | ICD-10-CM

## 2014-02-25 DIAGNOSIS — Z8249 Family history of ischemic heart disease and other diseases of the circulatory system: Secondary | ICD-10-CM

## 2014-02-25 DIAGNOSIS — Z8739 Personal history of other diseases of the musculoskeletal system and connective tissue: Secondary | ICD-10-CM

## 2014-02-25 DIAGNOSIS — F0280 Dementia in other diseases classified elsewhere without behavioral disturbance: Secondary | ICD-10-CM | POA: Diagnosis present

## 2014-02-25 DIAGNOSIS — M67919 Unspecified disorder of synovium and tendon, unspecified shoulder: Secondary | ICD-10-CM

## 2014-02-25 DIAGNOSIS — C3491 Malignant neoplasm of unspecified part of right bronchus or lung: Secondary | ICD-10-CM

## 2014-02-25 DIAGNOSIS — E872 Acidosis, unspecified: Secondary | ICD-10-CM | POA: Diagnosis present

## 2014-02-25 DIAGNOSIS — R7402 Elevation of levels of lactic acid dehydrogenase (LDH): Secondary | ICD-10-CM | POA: Diagnosis present

## 2014-02-25 DIAGNOSIS — M542 Cervicalgia: Secondary | ICD-10-CM

## 2014-02-25 DIAGNOSIS — R739 Hyperglycemia, unspecified: Secondary | ICD-10-CM

## 2014-02-25 DIAGNOSIS — S06330A Contusion and laceration of cerebrum, unspecified, without loss of consciousness, initial encounter: Secondary | ICD-10-CM | POA: Diagnosis present

## 2014-02-25 DIAGNOSIS — I10 Essential (primary) hypertension: Secondary | ICD-10-CM

## 2014-02-25 DIAGNOSIS — M549 Dorsalgia, unspecified: Secondary | ICD-10-CM

## 2014-02-25 DIAGNOSIS — Z853 Personal history of malignant neoplasm of breast: Secondary | ICD-10-CM

## 2014-02-25 DIAGNOSIS — R609 Edema, unspecified: Secondary | ICD-10-CM

## 2014-02-25 DIAGNOSIS — I248 Other forms of acute ischemic heart disease: Secondary | ICD-10-CM | POA: Diagnosis present

## 2014-02-25 DIAGNOSIS — E559 Vitamin D deficiency, unspecified: Secondary | ICD-10-CM

## 2014-02-25 DIAGNOSIS — I2789 Other specified pulmonary heart diseases: Secondary | ICD-10-CM

## 2014-02-25 DIAGNOSIS — R0600 Dyspnea, unspecified: Secondary | ICD-10-CM

## 2014-02-25 DIAGNOSIS — Z7982 Long term (current) use of aspirin: Secondary | ICD-10-CM

## 2014-02-25 DIAGNOSIS — Z79899 Other long term (current) drug therapy: Secondary | ICD-10-CM

## 2014-02-25 DIAGNOSIS — I629 Nontraumatic intracranial hemorrhage, unspecified: Secondary | ICD-10-CM | POA: Diagnosis present

## 2014-02-25 DIAGNOSIS — G8929 Other chronic pain: Secondary | ICD-10-CM | POA: Diagnosis present

## 2014-02-25 DIAGNOSIS — R0609 Other forms of dyspnea: Secondary | ICD-10-CM

## 2014-02-25 DIAGNOSIS — J45909 Unspecified asthma, uncomplicated: Secondary | ICD-10-CM | POA: Diagnosis present

## 2014-02-25 DIAGNOSIS — Z87891 Personal history of nicotine dependence: Secondary | ICD-10-CM

## 2014-02-25 DIAGNOSIS — I2489 Other forms of acute ischemic heart disease: Secondary | ICD-10-CM | POA: Diagnosis present

## 2014-02-25 DIAGNOSIS — G9340 Encephalopathy, unspecified: Secondary | ICD-10-CM

## 2014-02-25 DIAGNOSIS — R7401 Elevation of levels of liver transaminase levels: Secondary | ICD-10-CM | POA: Diagnosis present

## 2014-02-25 DIAGNOSIS — R932 Abnormal findings on diagnostic imaging of liver and biliary tract: Secondary | ICD-10-CM

## 2014-02-25 DIAGNOSIS — G309 Alzheimer's disease, unspecified: Secondary | ICD-10-CM | POA: Diagnosis present

## 2014-02-25 DIAGNOSIS — R571 Hypovolemic shock: Secondary | ICD-10-CM

## 2014-02-25 DIAGNOSIS — Z888 Allergy status to other drugs, medicaments and biological substances status: Secondary | ICD-10-CM

## 2014-02-25 DIAGNOSIS — N179 Acute kidney failure, unspecified: Secondary | ICD-10-CM

## 2014-02-25 DIAGNOSIS — S062X9A Diffuse traumatic brain injury with loss of consciousness of unspecified duration, initial encounter: Secondary | ICD-10-CM

## 2014-02-25 DIAGNOSIS — Z85118 Personal history of other malignant neoplasm of bronchus and lung: Secondary | ICD-10-CM

## 2014-02-25 DIAGNOSIS — I4510 Unspecified right bundle-branch block: Secondary | ICD-10-CM | POA: Diagnosis present

## 2014-02-25 DIAGNOSIS — Z880 Allergy status to penicillin: Secondary | ICD-10-CM

## 2014-02-25 DIAGNOSIS — M1990 Unspecified osteoarthritis, unspecified site: Secondary | ICD-10-CM | POA: Diagnosis present

## 2014-02-25 DIAGNOSIS — D649 Anemia, unspecified: Secondary | ICD-10-CM

## 2014-02-25 DIAGNOSIS — I519 Heart disease, unspecified: Secondary | ICD-10-CM

## 2014-02-25 DIAGNOSIS — Z823 Family history of stroke: Secondary | ICD-10-CM

## 2014-02-25 DIAGNOSIS — I739 Peripheral vascular disease, unspecified: Secondary | ICD-10-CM | POA: Diagnosis present

## 2014-02-25 DIAGNOSIS — I609 Nontraumatic subarachnoid hemorrhage, unspecified: Secondary | ICD-10-CM

## 2014-02-25 DIAGNOSIS — W1789XA Other fall from one level to another, initial encounter: Secondary | ICD-10-CM | POA: Diagnosis present

## 2014-02-25 HISTORY — DX: Malignant neoplasm of unspecified site of unspecified female breast: C50.919

## 2014-02-25 HISTORY — DX: Personal history of nicotine dependence: Z87.891

## 2014-02-25 HISTORY — DX: Chronic diastolic (congestive) heart failure: I50.32

## 2014-02-25 HISTORY — DX: Cerebral infarction, unspecified: I63.9

## 2014-02-25 HISTORY — DX: Malignant neoplasm of unspecified part of unspecified bronchus or lung: C34.90

## 2014-02-25 HISTORY — DX: Venous insufficiency (chronic) (peripheral): I87.2

## 2014-02-25 HISTORY — DX: Pulmonary hypertension, unspecified: I27.20

## 2014-02-25 HISTORY — DX: Paroxysmal atrial fibrillation: I48.0

## 2014-02-25 LAB — COMPREHENSIVE METABOLIC PANEL
ALK PHOS: 100 U/L (ref 39–117)
ALT: 34 U/L (ref 0–35)
AST: 64 U/L — ABNORMAL HIGH (ref 0–37)
Albumin: 3.7 g/dL (ref 3.5–5.2)
BUN: 46 mg/dL — ABNORMAL HIGH (ref 6–23)
CO2: 23 mEq/L (ref 19–32)
Calcium: 9.1 mg/dL (ref 8.4–10.5)
Chloride: 103 mEq/L (ref 96–112)
Creatinine, Ser: 1.9 mg/dL — ABNORMAL HIGH (ref 0.50–1.10)
GFR, EST AFRICAN AMERICAN: 29 mL/min — AB (ref >90)
GFR, EST NON AFRICAN AMERICAN: 25 mL/min — AB (ref >90)
GLUCOSE: 274 mg/dL — AB (ref 70–99)
POTASSIUM: 3.9 meq/L (ref 3.7–5.3)
SODIUM: 144 meq/L (ref 137–147)
Total Bilirubin: 1.1 mg/dL (ref 0.3–1.2)
Total Protein: 7.3 g/dL (ref 6.0–8.3)

## 2014-02-25 LAB — URINE MICROSCOPIC-ADD ON

## 2014-02-25 LAB — CBC WITH DIFFERENTIAL/PLATELET
Basophils Absolute: 0 10*3/uL (ref 0.0–0.1)
Basophils Relative: 0 % (ref 0–1)
Eosinophils Absolute: 0.2 10*3/uL (ref 0.0–0.7)
Eosinophils Relative: 1 % (ref 0–5)
HCT: 41.6 % (ref 36.0–46.0)
HEMOGLOBIN: 13.6 g/dL (ref 12.0–15.0)
LYMPHS PCT: 31 % (ref 12–46)
Lymphs Abs: 3.6 10*3/uL (ref 0.7–4.0)
MCH: 29.6 pg (ref 26.0–34.0)
MCHC: 32.7 g/dL (ref 30.0–36.0)
MCV: 90.6 fL (ref 78.0–100.0)
MONOS PCT: 3 % (ref 3–12)
Monocytes Absolute: 0.3 10*3/uL (ref 0.1–1.0)
NEUTROS PCT: 65 % (ref 43–77)
Neutro Abs: 7.5 10*3/uL (ref 1.7–7.7)
PLATELETS: 177 10*3/uL (ref 150–400)
RBC: 4.59 MIL/uL (ref 3.87–5.11)
RDW: 14.6 % (ref 11.5–15.5)
WBC: 11.6 10*3/uL — AB (ref 4.0–10.5)

## 2014-02-25 LAB — MRSA PCR SCREENING: MRSA by PCR: NEGATIVE

## 2014-02-25 LAB — CBG MONITORING, ED: Glucose-Capillary: 71 mg/dL (ref 70–99)

## 2014-02-25 LAB — I-STAT ARTERIAL BLOOD GAS, ED
Acid-base deficit: 3 mmol/L — ABNORMAL HIGH (ref 0.0–2.0)
Bicarbonate: 23.9 mEq/L (ref 20.0–24.0)
O2 SAT: 100 %
PCO2 ART: 50.9 mmHg — AB (ref 35.0–45.0)
Patient temperature: 98.6
TCO2: 25 mmol/L (ref 0–100)
pH, Arterial: 7.28 — ABNORMAL LOW (ref 7.350–7.450)
pO2, Arterial: 192 mmHg — ABNORMAL HIGH (ref 80.0–100.0)

## 2014-02-25 LAB — URINALYSIS, ROUTINE W REFLEX MICROSCOPIC
BILIRUBIN URINE: NEGATIVE
Glucose, UA: NEGATIVE mg/dL
Hgb urine dipstick: NEGATIVE
KETONES UR: NEGATIVE mg/dL
Leukocytes, UA: NEGATIVE
NITRITE: NEGATIVE
PROTEIN: 30 mg/dL — AB
Specific Gravity, Urine: 1.019 (ref 1.005–1.030)
Urobilinogen, UA: 1 mg/dL (ref 0.0–1.0)
pH: 5.5 (ref 5.0–8.0)

## 2014-02-25 LAB — I-STAT CG4 LACTIC ACID, ED: LACTIC ACID, VENOUS: 3.68 mmol/L — AB (ref 0.5–2.2)

## 2014-02-25 LAB — I-STAT TROPONIN, ED
TROPONIN I, POC: 0.12 ng/mL — AB (ref 0.00–0.08)
Troponin i, poc: 0.11 ng/mL (ref 0.00–0.08)

## 2014-02-25 LAB — POC OCCULT BLOOD, ED: Fecal Occult Bld: NEGATIVE

## 2014-02-25 LAB — TROPONIN I: TROPONIN I: 0.96 ng/mL — AB (ref <0.30)

## 2014-02-25 MED ORDER — DEXTROSE 5 % IV SOLN: 2.0000 g | Freq: Once | INTRAVENOUS | Status: DC

## 2014-02-25 MED ORDER — LEVOTHYROXINE SODIUM 137 MCG PO TABS
137.0000 ug | ORAL_TABLET | Freq: Every day | ORAL | Status: DC
  Administered 2014-02-26 – 2014-03-04 (×7): 137 ug via ORAL
  Filled 2014-02-25 (×9): qty 1

## 2014-02-25 MED ORDER — SODIUM CHLORIDE 0.9 % IV SOLN: INTRAVENOUS | Status: DC

## 2014-02-25 MED ORDER — ONDANSETRON HCL 4 MG PO TABS: 4.0000 mg | ORAL_TABLET | Freq: Four times a day (QID) | ORAL | Status: DC | PRN

## 2014-02-25 MED ORDER — VANCOMYCIN HCL IN DEXTROSE 1-5 GM/200ML-% IV SOLN
1000.0000 mg | Freq: Once | INTRAVENOUS | Status: AC
  Administered 2014-02-25: 1000 mg via INTRAVENOUS
  Filled 2014-02-25: qty 200

## 2014-02-25 MED ORDER — SODIUM CHLORIDE 0.9 % IV BOLUS (SEPSIS)
1000.0000 mL | Freq: Once | INTRAVENOUS | Status: AC
  Administered 2014-02-25: 1000 mL via INTRAVENOUS

## 2014-02-25 MED ORDER — VANCOMYCIN HCL IN DEXTROSE 1-5 GM/200ML-% IV SOLN: 1000.0000 mg | INTRAVENOUS | Status: DC

## 2014-02-25 MED ORDER — PNEUMOCOCCAL VAC POLYVALENT 25 MCG/0.5ML IJ INJ: 0.5000 mL | INJECTION | INTRAMUSCULAR | Status: DC

## 2014-02-25 MED ORDER — SODIUM CHLORIDE 0.9 % IV BOLUS (SEPSIS)
2000.0000 mL | Freq: Once | INTRAVENOUS | Status: AC
  Administered 2014-02-25: 2000 mL via INTRAVENOUS

## 2014-02-25 MED ORDER — DONEPEZIL HCL 10 MG PO TABS
10.0000 mg | ORAL_TABLET | Freq: Every day | ORAL | Status: DC
  Administered 2014-02-25 – 2014-03-03 (×7): 10 mg via ORAL
  Filled 2014-02-25 (×9): qty 1

## 2014-02-25 MED ORDER — AZTREONAM 1 G IJ SOLR
1.0000 g | Freq: Three times a day (TID) | INTRAMUSCULAR | Status: DC
  Filled 2014-02-25: qty 1

## 2014-02-25 MED ORDER — MORPHINE SULFATE 2 MG/ML IJ SOLN
1.0000 mg | INTRAMUSCULAR | Status: DC | PRN
  Administered 2014-02-25: 1 mg via INTRAVENOUS
  Filled 2014-02-25: qty 1

## 2014-02-25 MED ORDER — LEVOFLOXACIN IN D5W 750 MG/150ML IV SOLN
750.0000 mg | Freq: Once | INTRAVENOUS | Status: AC
  Administered 2014-02-25: 750 mg via INTRAVENOUS
  Filled 2014-02-25: qty 150

## 2014-02-25 MED ORDER — PNEUMOCOCCAL VAC POLYVALENT 25 MCG/0.5ML IJ INJ
0.5000 mL | INJECTION | INTRAMUSCULAR | Status: AC | PRN
  Administered 2014-03-04: 0.5 mL via INTRAMUSCULAR
  Filled 2014-02-25 (×2): qty 0.5

## 2014-02-25 MED ORDER — ASPIRIN EC 81 MG PO TBEC
81.0000 mg | DELAYED_RELEASE_TABLET | Freq: Every day | ORAL | Status: DC
  Administered 2014-02-26: 81 mg via ORAL
  Filled 2014-02-25 (×3): qty 1

## 2014-02-25 MED ORDER — SODIUM CHLORIDE 0.9 % IV SOLN
INTRAVENOUS | Status: DC
  Administered 2014-02-25: 12:00:00 via INTRAVENOUS

## 2014-02-25 MED ORDER — ONDANSETRON HCL 4 MG/2ML IJ SOLN: 4.0000 mg | Freq: Four times a day (QID) | INTRAMUSCULAR | Status: DC | PRN

## 2014-02-25 MED ORDER — TOBRAMYCIN-DEXAMETHASONE 0.3-0.1 % OP OINT
TOPICAL_OINTMENT | Freq: Every day | OPHTHALMIC | Status: DC | PRN
  Filled 2014-02-25: qty 3.5

## 2014-02-25 MED ORDER — LEVOFLOXACIN IN D5W 750 MG/150ML IV SOLN
750.0000 mg | INTRAVENOUS | Status: DC
Start: 2014-02-27 — End: 2014-02-26

## 2014-02-25 MED ORDER — DEXTROSE 5 % IV SOLN
1.0000 g | Freq: Three times a day (TID) | INTRAVENOUS | Status: DC
  Administered 2014-02-25 – 2014-02-26 (×3): 1 g via INTRAVENOUS
  Filled 2014-02-25 (×4): qty 1

## 2014-02-25 NOTE — Progress Notes (Signed)
CRITICAL VALUE ALERT  Critical value received:  Troponin 0.96  Date of notification:  02/25/14  Time of notification:  1945  Critical value read back:yes  Nurse who received alert:  Venetia Constable  MD notified (1st page):  Dr. Allean Found  Time of first page:  1945  MD notified (2nd page):  Time of second page:  Responding MD:  Dr. Allean Found  Time MD responded:1950

## 2014-02-25 NOTE — Progress Notes (Signed)
ANTIBIOTIC CONSULT NOTE - INITIAL  Pharmacy Consult for Vancomycin, Aztreonam, Levaquin Indication: r/o sepsis  Allergies  Allergen Reactions  . Strawberry Swelling    Swelling ,rash  . Hydrocodone-Homatropine     REACTION: Nausea  . Nsaids   . Penicillins     REACTION: rash  . Simvastatin     REACTION: muscle pain    Patient Measurements: Height: 5' 2.5" (158.8 cm) Weight: 190 lb (86.183 kg) IBW/kg (Calculated) : 51.25  Vital Signs: Temp: 95.6 F (35.3 C) (03/30 1201) Temp src: Rectal (03/30 1201) BP: 94/52 mmHg (03/30 1330) Pulse Rate: 87 (03/30 1315) Intake/Output from previous day:   Intake/Output from this shift:    Labs:  Recent Labs  02/25/14 1106  WBC 11.6*  HGB 13.6  PLT 177  CREATININE 1.90*   Estimated Creatinine Clearance: 26.4 ml/min (by C-G formula based on Cr of 1.9). No results found for this basename: VANCOTROUGH, VANCOPEAK, VANCORANDOM, GENTTROUGH, GENTPEAK, GENTRANDOM, TOBRATROUGH, TOBRAPEAK, TOBRARND, AMIKACINPEAK, AMIKACINTROU, AMIKACIN,  in the last 72 hours   Microbiology: No results found for this or any previous visit (from the past 720 hour(s)).  Medical History: Past Medical History  Diagnosis Date  . Hyperlipidemia   . Hypertension   . Cancer     breast  . GERD (gastroesophageal reflux disease)   . Diverticulosis   . Hx of adenomatous colonic polyps   . DVT (deep venous thrombosis) 09-30-11    right lower leg   . Arthritis   . Hypothyroidism   . Dementia     sees Dr. Brett Fairy  . Peripheral vascular disease 13    leg clot? rx  . History of kidney stones   . DDD (degenerative disc disease)   . Asthma   . H/O vein stripping     Medications:  See electronic med rec  Assessment: 79 y.o. female presents with weakness s/p unwitnessed fall. Pt presenting with hypotension and hypothermia. To begin broad spectrum antibiotics (Vanc, Aztreonam, Levaquin) for r/o sepsis. Wbc slightly elevated. SCr 1.9, est CrCl 26  ml/min.  Goal of Therapy:  Vancomycin trough level 15-20 mcg/ml  Plan:  1. Levaquin 750mg  IV q48h 2. Aztreonam 1gm IV q8h 3. Vancomycin 1gm q24h 4. Will f/u micro data, pt's clinical condition, renal function, vanc trough prn  Sherlon Handing, PharmD, BCPS Clinical pharmacist, pager [phone] 02/25/2014,2:08 PM

## 2014-02-25 NOTE — ED Provider Notes (Signed)
CSN: [id number]     Arrival date & time 02/25/14  1040 History   First MD Initiated Contact with Patient 02/25/14 1047     Chief Complaint  Patient presents with  . Hypotension     (Consider location/radiation/quality/duration/timing/severity/associated sxs/prior Treatment) HPI 78 year old female brought for suspected syncopal episode from home, at baseline she has a history of lung cancer status post prior resection, atrial fibrillation heart failure dementia at baseline and lives at home with family is able to walk and talk normally is usually oriented to person and sometimes to place but not to time, patient has amnesia for today's event, the patient has had very little oral intake for the last 2 weeks, patient has had no fever no headache no chest pain no change in chronic back pain no new focal or lateralizing weakness or numbness no abdominal pain no bloody stools but today the family heard a sudden the patient falling and the patient immediately cried out and family went to check on the patient finding her on the bathroom floor, family is concerned the patient may have hit her head, patient may have had a syncopal episode, patient was at baseline mental status when found, patient denies headache neck pain chest pain shortness breath abdominal pain or back pain or pain to her extremities. There is no change in her speech vision swallowing or understanding she has no new focal or lateralizing weakness or numbness or incoordination but feels generally weak more so than usual today and is too weak to sit up or stand up now. For EMS the patient had systolic blood pressure 50. Blood sugar was mildly elevated prior to arrival approximately 160-180. There is no treatment prior to arrival. Patient just feels generally weak without other complaints today. Past Medical History  Diagnosis Date  . Hyperlipidemia     a. Myalgias with multiple statins.  . Hypertension   . Adenocarcinoma of breast     a.  s/p right simple and left modified radical mastectomy with chemotherapy and radiation in 2002.   Marland Kitchen GERD (gastroesophageal reflux disease)   . Diverticulosis   . Hx of adenomatous colonic polyps   . DVT (deep venous thrombosis) 09-30-11    a. Right lower leg 09/2011.  . Arthritis   . Hypothyroidism   . Dementia     a. sees Dr. Brett Fairy. b. on Aricept.  Marland Kitchen History of kidney stones   . DDD (degenerative disc disease)     a. cervical and lumbar herniation and DJD s/p multiple diskectomies and other back surgeries from 2002-2008   . Asthma   . H/O vein stripping   . History of tobacco abuse     a. quit 25+ years ago. PFTs in 2/15 were relatively normal.   . Chronic diastolic CHF (congestive heart failure)     a. ETT-myoview (9/12) with 4' exercise, EF 68%, no ischemia or infarction. b. Echo (10/14) with EF 55-60%, mod diast dysfunction, normal RV size/fcn, mod pulm HTN.   . Venous insufficiency     a. H/o venous stasis ulceration.   Marland Kitchen PAF (paroxysmal atrial fibrillation)     a. Only episode that noted was in setting of right upper lobectomy in 2014. She was briefly on amiodarone but stopped it on her own. She is not coumadin. If recurrent AF, will need to consider.  . Non-small cell lung cancer     a. s/p right upper lobectomy in 2014.   . Pulmonary HTN     a. Moderate  by echo 08/2013.  Marland Kitchen Cerebral infarct     a.  Remote anterior left frontal lobe infarct by CT head 01/2014.   Past Surgical History  Procedure Laterality Date  . Cholecystectomy  2007  . Breast surgery  2002    BL mastectomy  . Lumbar laminectomy      x3  . Rotator cuff repair Right 10  . Cervical spine surgery    . Hip surgery    . Spine surgery    . Abdominal hysterectomy    . Fiberoptic bronchoscopy with electromagnetic  10/07/2010  . Video bronchoscopy N/A 05/14/2013    Procedure: VIDEO BRONCHOSCOPY;  Surgeon: Grace Isaac, MD;  Location: [facility] OR;  Service: Thoracic;  Laterality: N/A;  . Video assisted  thoracoscopy (vats)/ lobectomy Right 05/14/2013    Procedure: VIDEO ASSISTED THORACOSCOPY (VATS)/ LOBECTOMY;  Surgeon: Grace Isaac, MD;  Location: [facility];  Service: Thoracic;  Laterality: Right;  . Colonoscopy  02-18-09    per Dr. Fuller Plan, adenomatous polyp, repeat in 5 yrs   . Neck-acdf    . Varicose vein surgery    . Hemorrhoid surgery  1985  . Kidney stone surgery  1987   Family History  Problem Relation Age of Onset  . Diabetes Daughter   . Diabetes Daughter   . Heart disease Father   . Heart disease Brother   . Heart disease Sister   . Colon cancer Neg Hx   . Colon polyps Daughter   . Stroke Mother    History  Substance Use Topics  . Smoking status: Former Smoker -- 1.00 packs/day for 25 years    Types: Cigarettes    Quit date: 05/10/1980  . Smokeless tobacco: Never Used  . Alcohol Use: No   OB History   Grav Para Term Preterm Abortions TAB SAB Ect Mult Living                 Review of Systems 10 Systems reviewed and are negative for acute change except as noted in the HPI.   Allergies  Strawberry; Hydrocodone-homatropine; Nsaids; Penicillins; Simvastatin; and Statins  Home Medications   No current outpatient prescriptions on file. BP 144/116  Pulse 84  Temp(Src) 98.8 F (37.1 C) (Oral)  Resp 20  Ht 5\' 3"  (1.6 m)  Wt 191 lb 11.2 oz (86.955 kg)  BMI 33.97 kg/m2  SpO2 97% Physical Exam  Nursing note and vitals reviewed. Constitutional:  Awake, alert, nontoxic appearance with baseline speech for patient.  HENT:  Head: Atraumatic.  Mouth/Throat: No oropharyngeal exudate.  Eyes: EOM are normal. Pupils are equal, round, and reactive to light. Right eye exhibits no discharge. Left eye exhibits no discharge.  Neck: Neck supple.  Cervical spine nontender  Cardiovascular: Normal rate and regular rhythm.   No murmur heard. Pulmonary/Chest: Effort normal and breath sounds normal. No stridor. No respiratory distress. She has no wheezes. She has no rales. She  exhibits no tenderness.  Abdominal: Soft. Bowel sounds are normal. She exhibits no mass. There is no tenderness. There is no rebound.  Musculoskeletal: She exhibits no tenderness.  Baseline ROM, moves extremities with no obvious new focal weakness. Arms and legs nontender including hips.  Lymphadenopathy:    She has no cervical adenopathy.  Neurological: She is alert.  Awake, alert, cooperative, patient oriented to person and know she is in a hospital but does not know which one but that is typical for the patient she is not oriented to time and that is  also typical for the patient; motor strength 4/5 bilaterally; sensation normal to light touch bilaterally; peripheral visual fields full to confrontation; no facial asymmetry; tongue midline; major cranial nerves appear intact; no pronator drift, normal finger to nose bilaterally  Skin: No rash noted.  Psychiatric: She has a normal mood and affect.    ED Course  Procedures (including critical care time) Patient / Family / Caregiver understand and agree with initial ED impression and plan with expectations set for ED visit. SBP improved with IVF bolus. After CXR and U/A unremarkable ordered antibiotics for possibility of undifferentiated sepsis. Cannot r/o syncope. Pt with suspected dehydration and renal insufficiency. D/w [facility] to admit. 1410 Patient / Family / Caregiver informed of clinical course, understand medical decision-making process, and agree with plan.  CRITICAL CARE Performed by: Babette Relic Total critical care time: 62min including rechecks and d/w family and consideration of DNR/DNI/palliative care; Pt to be full resuscitation. Critical care time was exclusive of separately billable procedures and treating other patients. Critical care was necessary to treat or prevent imminent or life-threatening deterioration. Critical care was time spent personally by me on the following activities: development of treatment plan with patient  and/or surrogate as well as nursing, discussions with consultants, evaluation of patient's response to treatment, examination of patient, obtaining history from patient or surrogate, ordering and performing treatments and interventions, ordering and review of laboratory studies, ordering and review of radiographic studies, pulse oximetry and re-evaluation of patient's condition. Labs Review Labs Reviewed  CBC WITH DIFFERENTIAL - Abnormal; Notable for the following:    WBC 11.6 (*)    All other components within normal limits  COMPREHENSIVE METABOLIC PANEL - Abnormal; Notable for the following:    Glucose, Bld 274 (*)    BUN 46 (*)    Creatinine, Ser 1.90 (*)    AST 64 (*)    GFR calc non Af Amer 25 (*)    GFR calc Af Amer 29 (*)    All other components within normal limits  URINALYSIS, ROUTINE W REFLEX MICROSCOPIC - Abnormal; Notable for the following:    APPearance HAZY (*)    Protein, ur 30 (*)    All other components within normal limits  URINE MICROSCOPIC-ADD ON - Abnormal; Notable for the following:    Squamous Epithelial / LPF FEW (*)    Casts HYALINE CASTS (*)    All other components within normal limits  COMPREHENSIVE METABOLIC PANEL - Abnormal; Notable for the following:    Glucose, Bld 113 (*)    BUN 33 (*)    Creatinine, Ser 1.23 (*)    Albumin 3.0 (*)    AST 53 (*)    ALT 43 (*)    GFR calc non Af Amer 42 (*)    GFR calc Af Amer 48 (*)    All other components within normal limits  TROPONIN I - Abnormal; Notable for the following:    Troponin I 0.96 (*)    All other components within normal limits  TROPONIN I - Abnormal; Notable for the following:    Troponin I 1.77 (*)    All other components within normal limits  TROPONIN I - Abnormal; Notable for the following:    Troponin I 0.84 (*)    All other components within normal limits  D-DIMER, QUANTITATIVE - Abnormal; Notable for the following:    D-Dimer, Quant 11.85 (*)    All other components within normal limits   BASIC METABOLIC PANEL - Abnormal; Notable for  the following:    Potassium 5.5 (*)    Glucose, Bld 125 (*)    BUN 27 (*)    GFR calc non Af Amer 51 (*)    GFR calc Af Amer 59 (*)    All other components within normal limits  HEPATIC FUNCTION PANEL - Abnormal; Notable for the following:    Albumin 3.2 (*)    AST 55 (*)    ALT 39 (*)    All other components within normal limits  I-STAT CG4 LACTIC ACID, ED - Abnormal; Notable for the following:    Lactic Acid, Venous 3.68 (*)    All other components within normal limits  I-STAT TROPOININ, ED - Abnormal; Notable for the following:    Troponin i, poc 0.11 (*)    All other components within normal limits  I-STAT TROPOININ, ED - Abnormal; Notable for the following:    Troponin i, poc 0.12 (*)    All other components within normal limits  I-STAT ARTERIAL BLOOD GAS, ED - Abnormal; Notable for the following:    pH, Arterial 7.280 (*)    pCO2 arterial 50.9 (*)    pO2, Arterial 192.0 (*)    Acid-base deficit 3.0 (*)    All other components within normal limits  URINE CULTURE  CULTURE, BLOOD (ROUTINE X 2)  CULTURE, BLOOD (ROUTINE X 2)  MRSA PCR SCREENING  CBC  CBC  HEMOGLOBIN A1C  TROPONIN I  TROPONIN I  TROPONIN I  POC OCCULT BLOOD, ED  CBG MONITORING, ED   Imaging Review Dg Chest 1 View  02/25/2014   CLINICAL DATA:  Hypotension, confusion  EXAM: CHEST - 1 VIEW  COMPARISON:  06/21/2013, 02/14/2014  FINDINGS: Postsurgical changes are noted in the thoracolumbar spine. Cardiac shadow is prominent but stable in appearance. Postsurgical changes are seen in the right hilum. No focal infiltrate is noted. No acute abnormality is seen.  IMPRESSION: No acute abnormality noted.   Electronically Signed   By: Inez Catalina M.D.   On: 02/25/2014 13:44   Ct Head Wo Contrast  02/27/2014   CLINICAL DATA:  Follow-up subarachnoid hemorrhage  EXAM: CT HEAD WITHOUT CONTRAST  TECHNIQUE: Contiguous axial images were obtained from the base of the skull  through the vertex without intravenous contrast.  COMPARISON:  CT head 02/25/2014  FINDINGS: Interval improvement in small amount of subarachnoid hemorrhage in the left parietal region. This is most likely a posttraumatic hemorrhage given its location in size. No subdural hemorrhage.  Mild atrophy. 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 No acute ischemic infarct. No mass or edema or midline shift. Negative for skull fracture.  IMPRESSION: Small amount of subarachnoid hemorrhage posteriorly on the left shows interval improvement. No new area of hemorrhage or infarction.   Electronically Signed   By: Franchot Gallo M.D.   On: 02/27/2014 09:09     EKG Interpretation   Date/Time:  Monday February 25 2014 10:51:26 EDT Ventricular Rate:  91 PR Interval:  152 QRS Duration: 120 QT Interval:  393 QTC Calculation: 483 R Axis:   16 Text Interpretation:  Sinus rhythm IVCD, consider atypical RBBB Compared  to previous tracing QRS duration has increased Confirmed by [facility]  MD,  Jenny Reichmann ([id number]) on 02/25/2014 11:11:52 AM      MDM   Final diagnoses:  Sepsis  Dehydration  Hypotension  Brain contusion  Dementia  Hyperglycemia    The patient appears reasonably stabilized for admission considering the current resources, flow, and capabilities available  in the ED at this time, and I doubt any other [facility] requiring further screening and/or treatment in the ED prior to admission.    Babette Relic, MD 02/27/14 [id number]

## 2014-02-25 NOTE — Consult Note (Signed)
Cardiology Consultation Note  Patient ID: Alexa [name], MRN: [id number], DOB/AGE: 08/27/1936 78 y.o. Admit date: 02/25/2014   Date of Consult: 02/25/2014 Primary Physician: Laurey Morale, MD Primary Cardiologist: Aundra Dubin  Chief Complaint: dizziness, confusion Reason for Consult: CHF  HPI: Alexa [name] is a 78 y/o F with history of chronic diastolic CHF, dementia, PAF (see below), prior DVT, remote breast cancer, lung CA s/p RULectomy 2014, moderate pulm HTN, chronic pain on scheduled morphine who presented to [facility] with a fall and recent confusion. She was last seen by Dr. Aundra Dubin 02/13/14 for multifactorial dyspnea in the office, suspected multifactorial from dCHF, deconditioning, lung surgery, and Lasix was changed to 40mg  qAM, 20mg  QPM (from 40mg  daily).  Some of the history is obtained from the chart/family as the patient has a difficult time chronologically sequencing the events. She repeats herself and stumbles over her words when story-telling, but is able to affirm yes/no questions with appropriate short details. Her daughter says this is usual for her but worse over the last 2 weeks. Per report, she went to the bathroom and felt dizzy and was unsteady on her feet and fell. The patient denies loss of consciousness, preceding dizziness, peri-event chest pain, palpitations, shortness of breath. There was concern that she had hit her head on a wall. She yelled immediately after a fall and felt slightly dizzy after. She denies recent orthopnea (but sleeps in chair due to back issues), LEE, vomiting, diarrea, but has had some nausea. Her family had said she had been more confused with poor oral intake and unsteady on her feet throughout the past 2 weeks. She apparently vomited in the ambulance x 1. There has been no change to her chronic morphine dosing. She was brought in by EMS with profound hypotension SBP 50 systolic, O2 sats 62 on arrival. This was responsive to 3L IVF in the ER and  she is on 75 cc/hr now. She was hypothermic on adm with temp of 95.6, WBC 11.6, Lactic acid 3.68. Also with evidence for AKI BUN/Cr 46/1.90, initial cbg 274 and followup 71. POC troponin 0.11->0.12 -> 0.96. CXR nonacute; CT head "Suggestion of tiny amount of subarachnoid blood versus tiny hemorrhagic contusion medial left parietal lobe. Remote anterior left frontal lobe infarct." IM d/w Dr. Saintclair Halsted (NS) who does not recommend any surgical intervention at this time. She currently denies any CP, SOB, nausea, or any acute symptoms.   Past Medical History  Diagnosis Date  . Hyperlipidemia     a. Myalgias with multiple statins.  . Hypertension   . Adenocarcinoma of breast     a. s/p right simple and left modified radical mastectomy with chemotherapy and radiation in 2002.   Marland Kitchen GERD (gastroesophageal reflux disease)   . Diverticulosis   . Hx of adenomatous colonic polyps   . DVT (deep venous thrombosis) 09-30-11    a. Right lower leg 09/2011.  . Arthritis   . Hypothyroidism   . Dementia     a. sees Dr. Brett Fairy. b. on Aricept.  . Peripheral vascular disease 13    leg clot? rx  . History of kidney stones   . DDD (degenerative disc disease)     a. cervical and lumbar herniation and DJD s/p multiple diskectomies and other back surgeries from 2002-2008   . Asthma   . H/O vein stripping   . History of tobacco abuse     a. quit 25+ years ago. PFTs in 2/15 were relatively normal.   .  Chronic diastolic CHF (congestive heart failure)     a. ETT-myoview (9/12) with 4' exercise, EF 68%, no ischemia or infarction. b. Echo (10/14) with EF 55-60%, mod diast dysfunction, normal RV size/fcn, mod pulm HTN.   . Venous insufficiency     a. Venous stasis ulceration.   Marland Kitchen PAF (paroxysmal atrial fibrillation)     a. Only episode that noted was in setting of right upper lobectomy in 2014. She was briefly on amiodarone but stopped it on her own. She is not coumadin. If recurrent AF, will need to consider.  . Non-small  cell lung cancer     a. s/p right upper lobectomy in 2014.   . Pulmonary HTN     a. Moderate by echo 08/2013.      Most Recent Cardiac Studies:  2D Echo 08/2013 - Left ventricle: The cavity size was normal. Wall thickness was normal. Systolic function was normal. The estimated ejection fraction was in the range of 55% to 60%. Wall motion was normal; there were no regional wall motion abnormalities. Features are consistent with a pseudonormal left ventricular filling pattern, with concomitant abnormal relaxation and increased filling pressure (grade 2 diastolic dysfunction). - Aortic valve: There was no stenosis. - Mitral valve: Mildly calcified annulus. Normal thickness leaflets . Trivial regurgitation. - Left atrium: The atrium was mildly dilated. - Right ventricle: The cavity size was normal. Systolic function was normal. - Tricuspid valve: Peak RV-RA gradient:53mm Hg (S). - Pulmonary arteries: PA systolic pressure 88-41 mmHg. - Systemic veins: IVC measured 2.1 cm with normal respirophasic variation, suggesting RA pressure 6-10 mmHg. Impressions: - Normal LV size and systolic function, EF 66-06%. Moderate diastolic dysfunction. Normal RV size and systolic function. Moderate pulmonary hypertension.  Nuc Stress Test 07/2011 Overall Impression:  Normal stress nuclear study. No evidence of ischemia. Normal LV function with an EF of 68%.   Surgical History:  Past Surgical History  Procedure Laterality Date  . Cholecystectomy  2007  . Breast surgery  2002    BL mastectomy  . Lumbar laminectomy      x3  . Rotator cuff repair Right 10  . Cervical spine surgery    . Hip surgery    . Spine surgery    . Abdominal hysterectomy    . Fiberoptic bronchoscopy with electromagnetic  10/07/2010  . Video bronchoscopy N/A 05/14/2013    Procedure: VIDEO BRONCHOSCOPY;  Surgeon: Grace Isaac, MD;  Location: [facility] OR;  Service: Thoracic;  Laterality: N/A;  . Video assisted thoracoscopy  (vats)/ lobectomy Right 05/14/2013    Procedure: VIDEO ASSISTED THORACOSCOPY (VATS)/ LOBECTOMY;  Surgeon: Grace Isaac, MD;  Location: [place];  Service: Thoracic;  Laterality: Right;  . Colonoscopy  02-18-09    per Dr. Fuller Plan, adenomatous polyp, repeat in 5 yrs   . Neck-acdf    . Varicose vein surgery    . Hemorrhoid surgery  1985  . Kidney stone surgery  1987     Home Meds: Prior to Admission medications   Medication Sig Start Date End Date Taking? Authorizing Provider  aspirin EC 81 MG tablet Take 1 tablet (81 mg total) by mouth daily. 11/12/13  Yes Burtis Junes, NP  donepezil (ARICEPT) 10 MG tablet Take 1 tablet (10 mg total) by mouth at bedtime. 01/09/14  Yes Carmen Dohmeier, MD  esomeprazole (NEXIUM) 40 MG packet Take 40 mg by mouth daily before breakfast. 09/27/13  Yes Burtis Junes, NP  furosemide (LASIX) 40 MG tablet 1 tablet (40mg ) AM  and 1/2 tablet (20mg ) PM 02/13/14  Yes Larey Dresser, MD  levothyroxine (SYNTHROID, LEVOTHROID) 137 MCG tablet Take 137 mcg by mouth daily before breakfast.   Yes Historical Provider, MD  morphine (MSIR) 30 MG tablet Take 30 mg by mouth 3 (three) times daily as needed for pain.    Yes Historical Provider, MD  oxyCODONE-acetaminophen (PERCOCET) 10-325 MG per tablet Take 1 tablet by mouth every 4 (four) hours as needed (pain).    Yes Historical Provider, MD  potassium chloride SA (K-DUR,KLOR-CON) 20 MEQ tablet Take 20 mEq by mouth daily. 02/13/14  Yes Larey Dresser, MD  tobramycin-dexamethasone [facility]) ophthalmic ointment Place into both eyes daily. As needed 09/27/13  Yes Burtis Junes, NP  valsartan (DIOVAN) 320 MG tablet Take 1 tablet (320 mg total) by mouth daily. 09/26/13  Yes Burtis Junes, NP    Inpatient Medications:  . sodium chloride   Intravenous STAT  . aspirin EC  81 mg Oral Daily  . aztreonam  1 g Intravenous 3 times per day  . donepezil  10 mg Oral QHS  . [START ON 02/27/2014] levofloxacin (LEVAQUIN) IV  750 mg Intravenous  Q48H  . [START ON 02/26/2014] levothyroxine  137 mcg Oral QAC breakfast  . [START ON 02/26/2014] vancomycin  1,000 mg Intravenous Q24H   . sodium chloride 75 mL/hr at 02/25/14 1735    Allergies:  Allergies  Allergen Reactions  . Strawberry Swelling    Swelling ,rash  . Hydrocodone-Homatropine     REACTION: Nausea  . Nsaids   . Penicillins     REACTION: rash  . Simvastatin     REACTION: muscle pain  . Statins     Myalgias with multiple statins     History   Social History  . Marital Status: Widowed    Spouse Name: N/A    Number of Children: 3  . Years of Education: 11   Occupational History  .     Social History Main Topics  . Smoking status: Former Smoker -- 1.00 packs/day for 25 years    Types: Cigarettes    Quit date: 05/10/1980  . Smokeless tobacco: Never Used  . Alcohol Use: No  . Drug Use: No  . Sexual Activity: No   Other Topics Concern  . Not on file   Social History Narrative   Patient is widowed.   Patient is right-handed.   Patient is retired.   Patient has an 11th grade education.   Patient has three daughters.     Family History  Problem Relation Age of Onset  . Diabetes Daughter   . Diabetes Daughter   . Heart disease Father   . Heart disease Brother   . Heart disease Sister   . Colon cancer Neg Hx   . Colon polyps Daughter   . Stroke Mother      Review of Systems:see above. All other systems reviewed and are otherwise negative except as noted above.  Labs: POC troponin 0.12-0.14 Lab Results  Component Value Date   WBC 11.6* 02/25/2014   HGB 13.6 02/25/2014   HCT 41.6 02/25/2014   MCV 90.6 02/25/2014   PLT 177 02/25/2014    Recent Labs Lab 02/25/14 1106  NA 144  K 3.9  CL 103  CO2 23  BUN 46*  CREATININE 1.90*  CALCIUM 9.1  PROT 7.3  BILITOT 1.1  ALKPHOS 100  ALT 34  AST 64*  GLUCOSE 274*   Lab Results  Component Value Date  CHOL 214* 10/02/2013   HDL 47.70 10/02/2013   LDLCALC 123* 03/02/2012   TRIG 130.0  10/02/2013   No results found for this basename: DDIMER    Radiology/Studies:  Dg Chest 1 View 02/25/2014   CLINICAL DATA:  Hypotension, confusion  EXAM: CHEST - 1 VIEW  COMPARISON:  06/21/2013, 02/14/2014  FINDINGS: Postsurgical changes are noted in the thoracolumbar spine. Cardiac shadow is prominent but stable in appearance. Postsurgical changes are seen in the right hilum. No focal infiltrate is noted. No acute abnormality is seen.  IMPRESSION: No acute abnormality noted.   Electronically Signed   By: Inez Catalina M.D.   On: 02/25/2014 13:44   Ct Head Wo Contrast 02/25/2014   CLINICAL DATA:  Weakness. Unconscious. Questionable trauma. Dementia.  EXAM: CT HEAD WITHOUT CONTRAST  CT CERVICAL SPINE WITHOUT CONTRAST  TECHNIQUE: Multidetector CT imaging of the head and cervical spine was performed following the standard protocol without intravenous contrast. Multiplanar CT image reconstructions of the cervical spine were also generated.  COMPARISON:  05/16/2008 head CT.  FINDINGS: CT HEAD FINDINGS  No skull fracture.  Suggestion of tiny amount of subarachnoid blood versus tiny hemorrhagic contusion medial left parietal lobe (series 3, image 24 and 25).  Anterior left frontal lobe encephalomalacia suggestive of prior infarct.  No CT evidence of large acute infarct.  Global atrophy without hydrocephalus.  No intracranial mass lesion noted on this unenhanced exam.  CT CERVICAL SPINE FINDINGS  No cervical spine fracture.  Mild motion artifact C2 level.  No abnormal prevertebral soft tissue swelling.  Prior fusion C4-C7.  Cervical spondylotic changes most notable C3-4.  Post therapy type changes right upper lung.  IMPRESSION: CT head:  Suggestion of tiny amount of subarachnoid blood versus tiny hemorrhagic contusion medial left parietal lobe (series 3, image 24 and 25).  Remote anterior left frontal lobe infarct.  Atrophy.  CT cervical spine:  No cervical spine fracture.  Prior fusion C4-C7.  Cervical spondylotic  changes most notable C3-4.  These results were called by telephone at the time of interpretation on 02/25/2014 at 1:33 PM to Dr. Riki Altes , who verbally acknowledged these results.   Electronically Signed   By: Chauncey Cruel M.D.   On: 02/25/2014 13:35   Ct Chest W Contrast 02/14/2014   CLINICAL DATA:  Lung cancer with partial right lung resection. Radiation therapy complete. Prior history of breast cancer.  EXAM: CT CHEST WITH CONTRAST  TECHNIQUE: Multidetector CT imaging of the chest was performed during intravenous contrast administration.  CONTRAST:  45mL OMNIPAQUE IOHEXOL 300 MG/ML  SOLN  COMPARISON:  DG CHEST 2 VIEW dated 06/21/2013; NM PET IMAGE INITIAL (PI) SKULL BASE TO THIGH dated 04/09/2013; CT CHEST W/O CM dated 04/05/2013  FINDINGS: No axillary or supraclavicular lymphadenopathy. Small paratracheal lymph nodes are not pathologic by size criteria. Right hilar lymph node measures 12 mm (image 23, series 2. Esophagus is normal.  Review of the lung windows demonstrate postsurgical change the right upper lobe. No nodularity. Left lung is clear. Airways are normal centrally.  Limited view of the upper abdomen demonstrates normal adrenal glands.  IMPRESSION: 1. No evidence level lung cancer recurrence. 2. Postoperative change in the right hemi thorax.   Electronically Signed   By: Suzy Bouchard M.D.   On: 02/14/2014 14:30   Ct Cervical Spine Wo Contrast 02/25/2014   CLINICAL DATA:  Weakness. Unconscious. Questionable trauma. Dementia.  EXAM: CT HEAD WITHOUT CONTRAST  CT CERVICAL SPINE WITHOUT CONTRAST  TECHNIQUE: Multidetector CT  imaging of the head and cervical spine was performed following the standard protocol without intravenous contrast. Multiplanar CT image reconstructions of the cervical spine were also generated.  COMPARISON:  05/16/2008 head CT.  FINDINGS: CT HEAD FINDINGS  No skull fracture.  Suggestion of tiny amount of subarachnoid blood versus tiny hemorrhagic contusion medial left parietal  lobe (series 3, image 24 and 25).  Anterior left frontal lobe encephalomalacia suggestive of prior infarct.  No CT evidence of large acute infarct.  Global atrophy without hydrocephalus.  No intracranial mass lesion noted on this unenhanced exam.  CT CERVICAL SPINE FINDINGS  No cervical spine fracture.  Mild motion artifact C2 level.  No abnormal prevertebral soft tissue swelling.  Prior fusion C4-C7.  Cervical spondylotic changes most notable C3-4.  Post therapy type changes right upper lung.  IMPRESSION: CT head:  Suggestion of tiny amount of subarachnoid blood versus tiny hemorrhagic contusion medial left parietal lobe (series 3, image 24 and 25).  Remote anterior left frontal lobe infarct.  Atrophy.  CT cervical spine:  No cervical spine fracture.  Prior fusion C4-C7.  Cervical spondylotic changes most notable C3-4.  These results were called by telephone at the time of interpretation on 02/25/2014 at 1:33 PM to Dr. Riki Altes , who verbally acknowledged these results.   Electronically Signed   By: Chauncey Cruel M.D.   On: 02/25/2014 13:35   EKG: NSR 91bpm, RBBB  Physical Exam: Blood pressure 113/69, pulse 80, temperature 97.5 F (36.4 C), temperature source Oral, resp. rate 23, height 5\' 3"  (1.6 m), weight 192 lb 3.9 oz (87.2 kg), SpO2 93.00%. General: Well developed, well nourished WF in no acute distress. Head: Normocephalic, atraumatic, sclera non-icteric, no xanthomas, nares are without discharge.  Neck: Negative for carotid bruits. JVD not elevated. Lungs: Clear bilaterally to auscultation without wheezes, rales, or rhonchi. Breathing is unlabored. Heart: RRR with S1 S2. No murmurs, rubs, or gallops appreciated. Abdomen: Soft, non-tender, non-distended with normoactive bowel sounds. No hepatomegaly. No rebound/guarding. No obvious abdominal masses. Msk:  Strength and tone appear normal for age. Extremities: No clubbing or cyanosis. Trace bilat edema. Various varicose veins noted. Distal pedal  pulses are 2+ and equal bilaterally. Neuro: Alert and oriented to self, place, does not know date. No facial asymmetry. Moves all extremities spontaneously. Psych:  Difficulty telling a linear story with occasional word finding difficulty. Answers shorter questions well however.   Assessment and Plan:   1. Possible sepsis - presenting with fall, profound hypotension with SBP 50s, hypothermia with temp 95.6, elevated WBC.  2. Acute respiratory failure - 62% on RA on admission. Improving. May need to consider eval for PE given history of DVT and prior cancers.  3. Acute kidney injury - baseline Cr 1-1.2, admitted with 1.9. Suspect due to dehydration in setting of poor oral intake in setting of diuretic use. 4. Chronic diastolic CHF - currently appears compensated. Will need to follow closely. Consider locking fluids in AM. 5. Mildly elevated troponin - normal nuc 2012. Possibly type II MI in setting of above acute illnesses. See below for thoughts from discussion with MD.  New RBBB on initial EKG, question related to metabolic derangements. F/u echo.  6. Moderate pulmonary HTN - by echo 08/2013. 7. H/o sole AF in setting of RULectomy 2014 - no symptoms to suggest clinical recurrence. Observe on telemetry. Per Dr. Claris Gladden note, if she has recurrent AF, will need to consider anticoagulation. 8. Dementia - per family more confused lately over the last  2 weeks. 9. Chronic pain from DDD - pain medication per IM. 10.  H/o remote breast CA tx with surgery/chemo/radiation in 2002, [place] lung cancer s/p RULectomy 2014.  Signed, Melina Copa PA-C 02/25/2014, 7:08 PM

## 2014-02-25 NOTE — Progress Notes (Signed)
Called by nurse that daughter wants to change the code status , discussed with daughter on phone and she wants her mother to be full code. Will change the code status to Full Code.

## 2014-02-25 NOTE — Consult Note (Signed)
I have seen and examined the patient along with Melina Copa, PA.  I have reviewed the chart, notes and new data.  I agree with PA's note.  History is a little disjointed, but it sounds like her PO intake has been poor recently, but she continued taking diuretics. She was likely hypovolemic. After IV fluids, her BP is now completely normal. She denies dyspnea or any pain, including no angina or pleurisy.  Her cardiovascular exam is normal. No arrhythmia, normal JVP, no gallop, normal S2, no edema. She has difficulty finding several words, but according to the family, this is not much changed from her baseline and is attributable to dementia. She is no longer hypoxic or hypothermic either.  Her ECG shows a new IVCD, almost a RBBB, but without tachycardia. Her cardiac troponin has increased a little more, now almost 1.0.  Labs are consistent with shock, likely hypovolemic, cannot exclude sepsis. Clinically euthyroid, most recent TSH was normal. Metabolic acidosis (increased lactic acid) on arrival.  Note virtual absence of coronary calcium on recent CT chest 3/19.  PLAN: Doubt acute coronary syndrome. Elevation in cardiac enzymes is most likely explained by shock, but have to consider possible RV strain due to pulmonary embolism (history of DVT, new RBBB), but anticoagulation is not recommended due to possible small intracranial hemorrhage. Check echo to look at both RV size/function and LV regional wall motion. If she is eating and drinking, I would stop the IV fluids to avoid CHF exacerbation.  Alexa Klein, MD, [place] [phone] 02/25/2014, 8:05 PM

## 2014-02-25 NOTE — H&P (Signed)
PCP:   Laurey Morale, MD   Chief Complaint:  Fall  HPI:  78 year old female who  has a past medical history of Hyperlipidemia; Hypertension; Cancer; GERD (gastroesophageal reflux disease); Diverticulosis; adenomatous colonic polyps; DVT (deep venous thrombosis) (09-30-11); Arthritis; Hypothyroidism; Dementia; Peripheral vascular disease (13); History of kidney stones; DDD (degenerative disc disease); Asthma; and H/O vein stripping. Presents to the ED chief complaint of fall in the bathroom. Patient has history of Alzheimer's dementia but is able to provide good history, as per patient she went to the bathroom and felt dizzy and was unsteady on her feet and fell. She denies passing out, and family heard the fall they brought her to the hospital. As per family patient has not been doing well over the past 2 weeks, she has been more confused, unsteady on her feet. She has been eating well but  not sure about drinking water. Patient takes morphine 30 mg 3 times a day when necessary for chronic back pain. But as per family she has been taking this medication for and there was no recent change in medication. Patient is followed by pain clinic as outpatient. Recently she  was seen by cardiology on 3/18, and her dose of Lasix was increased to 40 mg in the morning and 20 mg in the evening for worsening dyspnea. Her last creatinine on 3/19 who was 1.0. Today her creatinine is 1.90, BUN is 46. Patient also was found to be hypothermic with rectal temperature of 95.6, mild elevation of troponin to 0.12. UA was clear, chest x-ray shows no infiltrate. WBC 11.6 Patient was profoundly hypotensive and she came to the ED, and now has responded well to the IV fluids. Patient received 3 L of IV normal saline in ED. She denies nausea, diarrhea. She denies any chest pain or shortness of breath. She had one episode of vomiting in the ambulance.  Allergies:   Allergies  Allergen Reactions  . Strawberry Swelling     Swelling ,rash  . Hydrocodone-Homatropine     REACTION: Nausea  . Nsaids   . Penicillins     REACTION: rash  . Simvastatin     REACTION: muscle pain      Past Medical History  Diagnosis Date  . Hyperlipidemia   . Hypertension   . Cancer     breast  . GERD (gastroesophageal reflux disease)   . Diverticulosis   . Hx of adenomatous colonic polyps   . DVT (deep venous thrombosis) 09-30-11    right lower leg   . Arthritis   . Hypothyroidism   . Dementia     sees Dr. Brett Fairy  . Peripheral vascular disease 13    leg clot? rx  . History of kidney stones   . DDD (degenerative disc disease)   . Asthma   . H/O vein stripping     Past Surgical History  Procedure Laterality Date  . Cholecystectomy  2007  . Breast surgery  2002    BL mastectomy  . Lumbar laminectomy      x3  . Rotator cuff repair Right 10  . Cervical spine surgery    . Hip surgery    . Spine surgery    . Abdominal hysterectomy    . Fiberoptic bronchoscopy with electromagnetic  10/07/2010  . Video bronchoscopy N/A 05/14/2013    Procedure: VIDEO BRONCHOSCOPY;  Surgeon: Grace Isaac, MD;  Location: [facility] OR;  Service: Thoracic;  Laterality: N/A;  . Video assisted thoracoscopy (vats)/ lobectomy Right 05/14/2013  Procedure: VIDEO ASSISTED THORACOSCOPY (VATS)/ LOBECTOMY;  Surgeon: Grace Isaac, MD;  Location: [facility];  Service: Thoracic;  Laterality: Right;  . Colonoscopy  02-18-09    per Dr. Fuller Plan, adenomatous polyp, repeat in 5 yrs   . Neck-acdf    . Varicose vein surgery    . Hemorrhoid surgery  1985  . Kidney stone surgery  1987    Prior to Admission medications   Medication Sig Start Date End Date Taking? Authorizing Provider  aspirin EC 81 MG tablet Take 1 tablet (81 mg total) by mouth daily. 11/12/13  Yes Burtis Junes, NP  donepezil (ARICEPT) 10 MG tablet Take 1 tablet (10 mg total) by mouth at bedtime. 01/09/14  Yes Carmen Dohmeier, MD  esomeprazole ([facility]) 40 MG packet Take 40 mg by mouth  daily before breakfast. 09/27/13  Yes Burtis Junes, NP  furosemide (LASIX) 40 MG tablet 1 tablet ($RemoveB'40mg'AnTdWeuo$ ) AM and 1/2 tablet ($RemoveBef'20mg'xdfxSwPYXr$ ) PM 02/13/14  Yes Larey Dresser, MD  levothyroxine (SYNTHROID, LEVOTHROID) 137 MCG tablet Take 137 mcg by mouth daily before breakfast.   Yes Historical Provider, MD  morphine (MSIR) 30 MG tablet Take 30 mg by mouth 3 (three) times daily as needed for pain.    Yes Historical Provider, MD  oxyCODONE-acetaminophen (PERCOCET) 10-325 MG per tablet Take 1 tablet by mouth every 4 (four) hours as needed (pain).    Yes Historical Provider, MD  potassium chloride SA (K-DUR,KLOR-CON) 20 MEQ tablet Take 20 mEq by mouth daily. 02/13/14  Yes Larey Dresser, MD  tobramycin-dexamethasone [facility]) ophthalmic ointment Place into both eyes daily. As needed 09/27/13  Yes Burtis Junes, NP  valsartan (DIOVAN) 320 MG tablet Take 1 tablet (320 mg total) by mouth daily. 09/26/13  Yes Burtis Junes, NP    Social History:  reports that she quit smoking about 33 years ago. Her smoking use included Cigarettes. She has a 25 pack-year smoking history. She has never used smokeless tobacco. She reports that she does not drink alcohol or use illicit drugs.  Family History  Problem Relation Age of Onset  . Diabetes Daughter   . Diabetes Daughter   . Heart disease Father   . Heart disease Brother   . Heart disease Sister   . Colon cancer Neg Hx   . Colon polyps Daughter   . Stroke Mother      All the positives are listed in BOLD  Review of Systems:  HEENT: Headache, blurred vision, runny nose, sore throat, dizziness Neck: Hypothyroidism, hyperthyroidism,,lymphadenopathy Chest : Shortness of breath, history of COPD, Asthma Heart : Chest pain, history of coronary arterey disease GI:  Nausea, vomiting, diarrhea, constipation, GERD GU: Dysuria, urgency, frequency of urination, hematuria Neuro: Stroke, seizures, syncope Psych: Depression, anxiety, hallucinations   Physical  Exam: Blood pressure 110/56, pulse 87, temperature 95.6 F (35.3 C), temperature source Rectal, resp. rate 16, height 5' 2.5" (1.588 m), weight 86.183 kg (190 lb), SpO2 100.00%. Constitutional:   Patient is a well-developed and well-nourished *female in no acute distress and cooperative with exam. Head: Normocephalic and atraumatic Mouth: Mucus membranes moist Eyes: PERRL, EOMI, conjunctivae normal Neck: Supple, No Thyromegaly Cardiovascular: RRR, S1 normal, S2 normal Pulmonary/Chest: CTAB, no wheezes, rales, or rhonchi Abdominal: Soft. Non-tender, non-distended, bowel sounds are normal, no masses, organomegaly, or guarding present.  Neurological: A&O x2, Strenght is normal and symmetric bilaterally, cranial nerve II-XII are grossly intact, no focal motor deficit, sensory intact to light touch bilaterally.  Extremities : Bilateral 1+  edema   Labs on Admission:  Results for orders placed during the hospital encounter of 02/25/14 (from the past 48 hour(s))  CBC WITH DIFFERENTIAL     Status: Abnormal   Collection Time    02/25/14 11:06 AM      Result Value Ref Range   WBC 11.6 (*) 4.0 - 10.5 K/uL   RBC 4.59  3.87 - 5.11 MIL/uL   Hemoglobin 13.6  12.0 - 15.0 g/dL   HCT 41.6  36.0 - 46.0 %   MCV 90.6  78.0 - 100.0 fL   MCH 29.6  26.0 - 34.0 pg   MCHC 32.7  30.0 - 36.0 g/dL   RDW 14.6  11.5 - 15.5 %   Platelets 177  150 - 400 K/uL   Neutrophils Relative % 65  43 - 77 %   Neutro Abs 7.5  1.7 - 7.7 K/uL   Lymphocytes Relative 31  12 - 46 %   Lymphs Abs 3.6  0.7 - 4.0 K/uL   Monocytes Relative 3  3 - 12 %   Monocytes Absolute 0.3  0.1 - 1.0 K/uL   Eosinophils Relative 1  0 - 5 %   Eosinophils Absolute 0.2  0.0 - 0.7 K/uL   Basophils Relative 0  0 - 1 %   Basophils Absolute 0.0  0.0 - 0.1 K/uL  COMPREHENSIVE METABOLIC PANEL     Status: Abnormal   Collection Time    02/25/14 11:06 AM      Result Value Ref Range   Sodium 144  137 - 147 mEq/L   Potassium 3.9  3.7 - 5.3 mEq/L    Chloride 103  96 - 112 mEq/L   CO2 23  19 - 32 mEq/L   Glucose, Bld 274 (*) 70 - 99 mg/dL   BUN 46 (*) 6 - 23 mg/dL   Creatinine, Ser 1.90 (*) 0.50 - 1.10 mg/dL   Calcium 9.1  8.4 - 10.5 mg/dL   Total Protein 7.3  6.0 - 8.3 g/dL   Albumin 3.7  3.5 - 5.2 g/dL   AST 64 (*) 0 - 37 U/L   ALT 34  0 - 35 U/L   Alkaline Phosphatase 100  39 - 117 U/L   Total Bilirubin 1.1  0.3 - 1.2 mg/dL   GFR calc non Af Amer 25 (*) >90 mL/min   GFR calc Af Amer 29 (*) >90 mL/min   Comment: (NOTE)     The eGFR has been calculated using the CKD EPI equation.     This calculation has not been validated in all clinical situations.     eGFR's persistently <90 mL/min signify possible Chronic Kidney     Disease.  POC OCCULT BLOOD, ED     Status: None   Collection Time    02/25/14 11:11 AM      Result Value Ref Range   Fecal Occult Bld NEGATIVE  NEGATIVE  I-STAT TROPOININ, ED     Status: Abnormal   Collection Time    02/25/14 11:16 AM      Result Value Ref Range   Troponin i, poc 0.11 (*) 0.00 - 0.08 ng/mL   Comment 3            Comment: Due to the release kinetics of cTnI,     a negative result within the first hours     of the onset of symptoms does not rule out     myocardial infarction with certainty.  If myocardial infarction is still suspected,     repeat the test at appropriate intervals.  I-STAT CG4 LACTIC ACID, ED     Status: Abnormal   Collection Time    02/25/14 11:20 AM      Result Value Ref Range   Lactic Acid, Venous 3.68 (*) 0.5 - 2.2 mmol/L  I-STAT TROPOININ, ED     Status: Abnormal   Collection Time    02/25/14 11:32 AM      Result Value Ref Range   Troponin i, poc 0.12 (*) 0.00 - 0.08 ng/mL   Comment 3            Comment: Due to the release kinetics of cTnI,     a negative result within the first hours     of the onset of symptoms does not rule out     myocardial infarction with certainty.     If myocardial infarction is still suspected,     repeat the test at appropriate  intervals.  URINALYSIS, ROUTINE W REFLEX MICROSCOPIC     Status: Abnormal   Collection Time    02/25/14 12:00 PM      Result Value Ref Range   Color, Urine YELLOW  YELLOW   APPearance HAZY (*) CLEAR   Specific Gravity, Urine 1.019  1.005 - 1.030   pH 5.5  5.0 - 8.0   Glucose, UA NEGATIVE  NEGATIVE mg/dL   Hgb urine dipstick NEGATIVE  NEGATIVE   Bilirubin Urine NEGATIVE  NEGATIVE   Ketones, ur NEGATIVE  NEGATIVE mg/dL   Protein, ur 30 (*) NEGATIVE mg/dL   Urobilinogen, UA 1.0  0.0 - 1.0 mg/dL   Nitrite NEGATIVE  NEGATIVE   Leukocytes, UA NEGATIVE  NEGATIVE  URINE MICROSCOPIC-ADD ON     Status: Abnormal   Collection Time    02/25/14 12:00 PM      Result Value Ref Range   Squamous Epithelial / LPF FEW (*) RARE   WBC, UA 0-2  <3 WBC/hpf   RBC / HPF 0-2  <3 RBC/hpf   Bacteria, UA RARE  RARE   Casts HYALINE CASTS (*) NEGATIVE   Comment: GRANULAR CAST  I-STAT ARTERIAL BLOOD GAS, ED     Status: Abnormal   Collection Time    02/25/14  1:36 PM      Result Value Ref Range   pH, Arterial 7.280 (*) 7.350 - 7.450   pCO2 arterial 50.9 (*) 35.0 - 45.0 mmHg   pO2, Arterial 192.0 (*) 80.0 - 100.0 mmHg   Bicarbonate 23.9  20.0 - 24.0 mEq/L   TCO2 25  0 - 100 mmol/L   O2 Saturation 100.0     Acid-base deficit 3.0 (*) 0.0 - 2.0 mmol/L   Patient temperature 98.6 F     Collection site RADIAL, ALLEN'S TEST ACCEPTABLE     Drawn by Operator     Sample type ARTERIAL    CBG MONITORING, ED     Status: None   Collection Time    02/25/14  2:39 PM      Result Value Ref Range   Glucose-Capillary 71  70 - 99 mg/dL    Radiological Exams on Admission: Dg Chest 1 View  02/25/2014   CLINICAL DATA:  Hypotension, confusion  EXAM: CHEST - 1 VIEW  COMPARISON:  06/21/2013, 02/14/2014  FINDINGS: Postsurgical changes are noted in the thoracolumbar spine. Cardiac shadow is prominent but stable in appearance. Postsurgical changes are seen in the right hilum. No focal infiltrate is  noted. No acute abnormality is  seen.  IMPRESSION: No acute abnormality noted.   Electronically Signed   By: Inez Catalina M.D.   On: 02/25/2014 13:44   Ct Head Wo Contrast  02/25/2014   CLINICAL DATA:  Weakness. Unconscious. Questionable trauma. Dementia.  EXAM: CT HEAD WITHOUT CONTRAST  CT CERVICAL SPINE WITHOUT CONTRAST  TECHNIQUE: Multidetector CT imaging of the head and cervical spine was performed following the standard protocol without intravenous contrast. Multiplanar CT image reconstructions of the cervical spine were also generated.  COMPARISON:  05/16/2008 head CT.  FINDINGS: CT HEAD FINDINGS  No skull fracture.  Suggestion of tiny amount of subarachnoid blood versus tiny hemorrhagic contusion medial left parietal lobe (series 3, image 24 and 25).  Anterior left frontal lobe encephalomalacia suggestive of prior infarct.  No CT evidence of large acute infarct.  Global atrophy without hydrocephalus.  No intracranial mass lesion noted on this unenhanced exam.  CT CERVICAL SPINE FINDINGS  No cervical spine fracture.  Mild motion artifact C2 level.  No abnormal prevertebral soft tissue swelling.  Prior fusion C4-C7.  Cervical spondylotic changes most notable C3-4.  Post therapy type changes right upper lung.  IMPRESSION: CT head:  Suggestion of tiny amount of subarachnoid blood versus tiny hemorrhagic contusion medial left parietal lobe (series 3, image 24 and 25).  Remote anterior left frontal lobe infarct.  Atrophy.  CT cervical spine:  No cervical spine fracture.  Prior fusion C4-C7.  Cervical spondylotic changes most notable C3-4.  These results were called by telephone at the time of interpretation on 02/25/2014 at 1:33 PM to Dr. Riki Altes , who verbally acknowledged these results.   Electronically Signed   By: Chauncey Cruel M.D.   On: 02/25/2014 13:35   Ct Cervical Spine Wo Contrast  02/25/2014   CLINICAL DATA:  Weakness. Unconscious. Questionable trauma. Dementia.  EXAM: CT HEAD WITHOUT CONTRAST  CT CERVICAL SPINE WITHOUT  CONTRAST  TECHNIQUE: Multidetector CT imaging of the head and cervical spine was performed following the standard protocol without intravenous contrast. Multiplanar CT image reconstructions of the cervical spine were also generated.  COMPARISON:  05/16/2008 head CT.  FINDINGS: CT HEAD FINDINGS  No skull fracture.  Suggestion of tiny amount of subarachnoid blood versus tiny hemorrhagic contusion medial left parietal lobe (series 3, image 24 and 25).  Anterior left frontal lobe encephalomalacia suggestive of prior infarct.  No CT evidence of large acute infarct.  Global atrophy without hydrocephalus.  No intracranial mass lesion noted on this unenhanced exam.  CT CERVICAL SPINE FINDINGS  No cervical spine fracture.  Mild motion artifact C2 level.  No abnormal prevertebral soft tissue swelling.  Prior fusion C4-C7.  Cervical spondylotic changes most notable C3-4.  Post therapy type changes right upper lung.  IMPRESSION: CT head:  Suggestion of tiny amount of subarachnoid blood versus tiny hemorrhagic contusion medial left parietal lobe (series 3, image 24 and 25).  Remote anterior left frontal lobe infarct.  Atrophy.  CT cervical spine:  No cervical spine fracture.  Prior fusion C4-C7.  Cervical spondylotic changes most notable C3-4.  These results were called by telephone at the time of interpretation on 02/25/2014 at 1:33 PM to Dr. Riki Altes , who verbally acknowledged these results.   Electronically Signed   By: Chauncey Cruel M.D.   On: 02/25/2014 13:35    Assessment/Plan Principal Problem:   Dehydration Active Problems:   Sepsis   AKI (acute kidney injury)   Diastolic CHF, chronic  Dementia  Dehydration Patient presented with near-syncope, most likely from dehydration from increased dose of Lasix. Will continue with IV fluids. Follow BUN creatinine in the morning. Will hold Lasix at this time. Check orthostatics every shift.  ? Sepsis Patient also has mild elevation of lactate 3.68, hyperthermia  with temperature of 95.61F. she has been empirically started on vancomycin, Azactam, Levaquin. UA is clear, chest x-ray shows no infiltrate. Consider stopping the antibiotics if no growth in blood cultures next 24-48 hrs.  Acute kidney injury Patient has acute kidney injury secondary to dehydration, we'll continue the IV fluids. Hold Lasix. Check BMP in the morning  Chronic diastolic heart failure Patient has grade 2 diastolic heart failure, with all the IV fluids she is at risk for going into acute exacerbation. She also has mild elevation of troponin 0.12, we'll call cardiology consultation for adjustment of her medications.  Chronic pain Will continue with morphine 2 mg IV every 4 hours when necessary.  Small subarachnoid bleed CT head done in the ED shows tiny amount of subarachnoid blood versus tiny hemorrhagic contusion medial left parietal lobe. I called and discussed with neurosurgeon on call Dr. Saintclair Halsted, who does not recommend any surgical intervention at this time.  Dementia Patient has mild memory impairment, at this time. No agitation. We'll continue with Aricept 10 mg by mouth daily  DVT prophylaxis SCDs  Code status- DO NOT RESUSCITATE   Family discussion: discussed with patient's daughter and granddaughter at bedside    Time Spent on Admission: 26 min  [facility] Pager: [phone] 02/25/2014, 3:12 PM  If 7PM-7AM, please contact night-coverage  [email or web address]  Password [email or web address]

## 2014-02-25 NOTE — ED Notes (Addendum)
Per EMS pt from home with c/o weakness, unconsciousness. Pt had unwitnessed fall by family. States they believe she hit her head on the wall. Pt yelled immediately after fall. Hx of alzheimers. BP initially 50 palp systolic. O2 sats 62 on arrival. Pt not eating and drinking last few weeks. CBG 186. EKG NSR. Pt alert on arrival. Zofran 4mg  IVP given in route.

## 2014-02-25 NOTE — ED Notes (Signed)
Patient transported to X-ray and CT 

## 2014-02-26 DIAGNOSIS — Z86718 Personal history of other venous thrombosis and embolism: Secondary | ICD-10-CM

## 2014-02-26 DIAGNOSIS — I369 Nonrheumatic tricuspid valve disorder, unspecified: Secondary | ICD-10-CM

## 2014-02-26 LAB — CBC
HCT: 37.7 % (ref 36.0–46.0)
Hemoglobin: 12.3 g/dL (ref 12.0–15.0)
MCH: 29.2 pg (ref 26.0–34.0)
MCHC: 32.6 g/dL (ref 30.0–36.0)
MCV: 89.5 fL (ref 78.0–100.0)
Platelets: 160 10*3/uL (ref 150–400)
RBC: 4.21 MIL/uL (ref 3.87–5.11)
RDW: 14.5 % (ref 11.5–15.5)
WBC: 6.5 10*3/uL (ref 4.0–10.5)

## 2014-02-26 LAB — COMPREHENSIVE METABOLIC PANEL
ALBUMIN: 3 g/dL — AB (ref 3.5–5.2)
ALT: 43 U/L — ABNORMAL HIGH (ref 0–35)
AST: 53 U/L — ABNORMAL HIGH (ref 0–37)
Alkaline Phosphatase: 96 U/L (ref 39–117)
BILIRUBIN TOTAL: 1 mg/dL (ref 0.3–1.2)
BUN: 33 mg/dL — AB (ref 6–23)
CHLORIDE: 108 meq/L (ref 96–112)
CO2: 21 mEq/L (ref 19–32)
CREATININE: 1.23 mg/dL — AB (ref 0.50–1.10)
Calcium: 8.8 mg/dL (ref 8.4–10.5)
GFR calc non Af Amer: 42 mL/min — ABNORMAL LOW (ref >90)
GFR, EST AFRICAN AMERICAN: 48 mL/min — AB (ref >90)
Glucose, Bld: 113 mg/dL — ABNORMAL HIGH (ref 70–99)
Potassium: 4.4 mEq/L (ref 3.7–5.3)
Sodium: 143 mEq/L (ref 137–147)
TOTAL PROTEIN: 6.3 g/dL (ref 6.0–8.3)

## 2014-02-26 LAB — URINE CULTURE
Colony Count: NO GROWTH
Culture: NO GROWTH

## 2014-02-26 LAB — TROPONIN I
Troponin I: 1.77 ng/mL (ref <0.30)
Troponin I: 0.84 ng/mL (ref <0.30)

## 2014-02-26 LAB — D-DIMER, QUANTITATIVE (NOT AT ARMC): D-Dimer, Quant: 11.85 ug/mL-FEU — ABNORMAL HIGH (ref 0.00–0.48)

## 2014-02-26 MED ORDER — ENSURE COMPLETE PO LIQD
237.0000 mL | Freq: Every day | ORAL | Status: DC
  Administered 2014-02-26 – 2014-03-03 (×3): 237 mL via ORAL

## 2014-02-26 MED ORDER — OXYCODONE-ACETAMINOPHEN 5-325 MG PO TABS
1.0000 | ORAL_TABLET | ORAL | Status: DC | PRN
  Administered 2014-02-26 – 2014-02-27 (×3): 1 via ORAL
  Filled 2014-02-26 (×3): qty 1

## 2014-02-26 MED ORDER — OXYCODONE HCL 5 MG PO TABS
5.0000 mg | ORAL_TABLET | ORAL | Status: DC | PRN
  Administered 2014-02-26 – 2014-03-01 (×3): 5 mg via ORAL
  Filled 2014-02-26 (×3): qty 1

## 2014-02-26 MED ORDER — MORPHINE SULFATE 2 MG/ML IJ SOLN: 1.0000 mg | INTRAMUSCULAR | Status: DC | PRN

## 2014-02-26 MED ORDER — MORPHINE SULFATE 30 MG PO TABS
30.0000 mg | ORAL_TABLET | Freq: Three times a day (TID) | ORAL | Status: DC | PRN
  Administered 2014-02-26 – 2014-03-04 (×8): 30 mg via ORAL
  Filled 2014-02-26 (×8): qty 2

## 2014-02-26 MED ORDER — OXYCODONE-ACETAMINOPHEN 10-325 MG PO TABS: 1.0000 | ORAL_TABLET | ORAL | Status: DC | PRN

## 2014-02-26 NOTE — Progress Notes (Signed)
  Echocardiogram 2D Echocardiogram has been performed.  Alexa [name] FRANCES 02/26/2014, 5:29 PM

## 2014-02-26 NOTE — Evaluation (Signed)
Occupational Therapy Evaluation  And Discharge Patient Details Name: Alexa [name] MRN: [id number] DOB: 1936-04-18 Today's Date: 02/26/2014    History of Present Illness Presents to the ED chief complaint of fall in the bathroom. Patient has history of Alzheimer's dementia but is able to provide good history, as per patient she went to the bathroom and felt dizzy and was unsteady on her feet and fell. She denies passing out, and family heard the fall they brought her to the hospital. As per family patient has not been doing well over the past 2 weeks, she has been more confused, unsteady on her feet   Clinical Impression   This pt presents to acute OT with above and overall is a min guard- S level with BADLs and there is always someone with her per her daughter that was in the room. No further OT needs identified, we will sign off.    Follow Up Recommendations  No OT follow up    Equipment Recommendations  None recommended by OT       Precautions / Restrictions Precautions Precautions: Fall Restrictions Weight Bearing Restrictions: No      Mobility Bed Mobility Overal bed mobility: Needs Assistance Bed Mobility: Sidelying to Sit   Sidelying to sit: Supervision       General bed mobility comments: Followed the direction well and moved to EOB without assist  Transfers Overall transfer level: Needs assistance   Transfers: Sit to/from Stand Sit to Stand: Min guard         General transfer comment: cued for safety/hand placement    Balance Overall balance assessment: Needs assistance Sitting-balance support: Feet supported;No upper extremity supported Sitting balance-Leahy Scale: Good Sitting balance - Comments: stuggles to reach to her feet to donn socks.  Maintained balance, but appears a little precaurious on the edge of the chair.   Standing balance support: Single extremity supported Standing balance-Leahy Scale: Poor                      ADL   Overall needs A Min guard A -S                                         Hand Dominance Right   Extremity/Trunk Assessment Upper Extremity Assessment Upper Extremity Assessment: Overall WFL for tasks assessed           Communication Communication Communication: No difficulties   Cognition Arousal/Alertness: Awake/alert Behavior During Therapy: WFL for tasks assessed/performed Overall Cognitive Status: History of cognitive impairments - at baseline                             Home Living Family/patient expects to be discharged to:: Private residence Living Arrangements: Children Available Help at Discharge: Family;Available 24 hours/day Type of Home: House Home Access: Stairs to enter [employer] of Steps: 1 Entrance Stairs-Rails: Right;Left Home Layout: One level                          Prior Functioning/Environment Level of Independence: Needs assistance  Gait / Transfers Assistance Needed: supervised ADL's / Homemaking Assistance Needed: assisted             End of Session: Equipment Utilized During Treatment: Rolling walker  Activity Tolerance: Patient tolerated treatment well Patient left: in  chair;with call bell/phone within reach   Time: 10:28-10:54 Charges:   EV, 1 self care  Almon Register [phone] 02/26/2014, 4:12 PM

## 2014-02-26 NOTE — Progress Notes (Signed)
*  PRELIMINARY RESULTS* Vascular Ultrasound Bilateral lower extremity venous duplex has been completed.  Preliminary findings: Right:  No evidence of DVT, or Baker's cyst. Findings consistent with superficial thrombosis involving varicosities of the thigh and calf area. Left: Localized acute to subacute DVT noted involving the peroneal vein. Findings consistent with superficial thrombosis involving varicosity of the calf area. No Baker's cyst.   Jami Ohlin FRANCES 02/26/2014, 6:39 PM

## 2014-02-26 NOTE — Evaluation (Signed)
Physical Therapy Evaluation Patient Details Name: SHARISSE RANTZ MRN: [id number] DOB: 1936-07-24 Today's Date: 02/26/2014   History of Present Illness  Presents to the ED chief complaint of fall in the bathroom. Patient has history of Alzheimer's dementia but is able to provide good history, as per patient she went to the bathroom and felt dizzy and was unsteady on her feet and fell. She denies passing out, and family heard the fall they brought her to the hospital. As per family patient has not been doing well over the past 2 weeks, she has been more confused, unsteady on her feet  Clinical Impression  Pt admitted with above problems and fall.  Pt currently limited functionally due to the problems listed below.  (see problems list.)  Pt will benefit from PT to maximize function and safety to be able to get home safely with available assist of family.     Follow Up Recommendations No PT follow up;Supervision/Assistance - 24 hour    Equipment Recommendations   (?RW)    Recommendations for Other Services       Precautions / Restrictions Precautions Precautions: Fall Restrictions Weight Bearing Restrictions: No      Mobility  Bed Mobility Overal bed mobility: Needs Assistance Bed Mobility: Sidelying to Sit   Sidelying to sit: Supervision       General bed mobility comments: Followed the direction well and moved to EOB without assist  Transfers Overall transfer level: Needs assistance   Transfers: Sit to/from Stand Sit to Stand: Min guard         General transfer comment: cued for safety/hand placement  Ambulation/Gait Ambulation/Gait assistance: Min guard Ambulation Distance (Feet): 140 Feet Assistive device: Rolling walker (2 wheeled) Gait Pattern/deviations: Step-through pattern Gait velocity: slowed Gait velocity interpretation: Below normal speed for age/gender General Gait Details: Much steadier than using no device, but mildly unsteady..  Needs lots of  cuing for walker safety and maneuvering it well.  Stairs            Wheelchair Mobility    Modified Rankin (Stroke Patients Only)       Balance Overall balance assessment: Needs assistance Sitting-balance support: Feet supported;No upper extremity supported Sitting balance-Leahy Scale: Good Sitting balance - Comments: stuggles to reach to her feet to donn socks.  Maintained balance, but appears a little precaurious on the edge of the chair.   Standing balance support: Single extremity supported Standing balance-Leahy Scale: Poor                       Pertinent Vitals/Pain     Home Living Family/patient expects to be discharged to:: Private residence Living Arrangements: Children Available Help at Discharge: Family;Available 24 hours/day Type of Home: House Home Access: Stairs to enter Entrance Stairs-Rails: Psychiatric nurse of Steps: 1 Home Layout: One level Home Equipment:  (TBA)      Prior Function Level of Independence: Needs assistance   Gait / Transfers Assistance Needed: supervised  ADL's / Homemaking Assistance Needed: assisted        Hand Dominance        Extremity/Trunk Assessment   Upper Extremity Assessment: Overall WFL for tasks assessed           Lower Extremity Assessment: Generalized weakness;Overall WFL for tasks assessed         Communication   Communication: No difficulties  Cognition Arousal/Alertness: Awake/alert Behavior During Therapy: WFL for tasks assessed/performed Overall Cognitive Status: History of cognitive impairments - at  baseline Area of Impairment: Orientation;Memory;Safety/judgement                    General Comments      Exercises        Assessment/Plan    PT Assessment Patient needs continued PT services  PT Diagnosis Generalized weakness   PT Problem List Decreased strength;Decreased activity tolerance;Decreased balance;Decreased mobility;Decreased  cognition;Decreased safety awareness  PT Treatment Interventions Gait training;DME instruction;Stair training;Functional mobility training;Therapeutic activities;Patient/family education   PT Goals (Current goals can be found in the Care Plan section) Acute Rehab PT Goals Patient Stated Goal: get back home PT Goal Formulation: With patient Time For Goal Achievement: 03/05/14 Potential to Achieve Goals: Good    Frequency Min 2X/week   Barriers to discharge        End of Session   Activity Tolerance: Patient tolerated treatment well Patient left: in chair;with call bell/phone within reach         Time: 1028-1054 PT Time Calculation (min): 26 min   Charges:   PT Evaluation $Initial PT Evaluation Tier I: 1 Procedure PT Treatments $Gait Training: 8-22 mins   PT G Codes:          Ronen Bromwell, Tessie Fass 02/26/2014, 11:20 AM 02/26/2014  Donnella Sham, PT [phone]  (pager)

## 2014-02-26 NOTE — Progress Notes (Signed)
Subjective: Feels a lot better.  No CP.  A little SOB after ambulating with PT but no CP.  Objective: Vital signs in last 24 hours: Temp:  [95.6 F (35.3 C)-98.3 F (36.8 C)] 97.7 F (36.5 C) (03/31 0816) Pulse Rate:  [25-171] 96 (03/31 1000) Resp:  [12-23] 21 (03/31 1000) BP: (94-144)/(46-78) 133/75 mmHg (03/31 1000) SpO2:  [81 %-100 %] 94 % (03/31 1000) Weight:  [190 lb (86.183 kg)-192 lb 3.9 oz (87.2 kg)] 192 lb 3.9 oz (87.2 kg) (03/31 0324) Last BM Date: 02/24/14  Intake/Output from previous day: 03/30 0701 - 03/31 0700 In: 880 [I.V.:780; IV Piggyback:100] Out: 50 [Urine:50] Intake/Output this shift:    Medications Current Facility-Administered Medications  Medication Dose Route Frequency Provider Last Rate Last Dose  . aspirin EC tablet 81 mg  81 mg Oral Daily Oswald Hillock, MD   81 mg at 02/26/14 8099  . donepezil (ARICEPT) tablet 10 mg  10 mg Oral QHS Oswald Hillock, MD   10 mg at 02/25/14 2117  . levothyroxine (SYNTHROID, LEVOTHROID) tablet 137 mcg  137 mcg Oral QAC breakfast Oswald Hillock, MD   137 mcg at 02/26/14 8338  . morphine (MSIR) tablet 30 mg  30 mg Oral TID PRN Cherene Altes, MD      . ondansetron [facility]) tablet 4 mg  4 mg Oral Q6H PRN Oswald Hillock, MD       Or  . ondansetron (ZOFRAN) injection 4 mg  4 mg Intravenous Q6H PRN Oswald Hillock, MD      . oxyCODONE-acetaminophen (PERCOCET/ROXICET) 5-325 MG per tablet 1 tablet  1 tablet Oral Q4H PRN Cherene Altes, MD   1 tablet at 02/26/14 2505   And  . oxyCODONE (Oxy IR/ROXICODONE) immediate release tablet 5 mg  5 mg Oral Q4H PRN Cherene Altes, MD      . pneumococcal 23 valent vaccine (PNU-IMMUNE) injection 0.5 mL  0.5 mL Intramuscular Prior to discharge Oswald Hillock, MD      . tobramycin-dexamethasone [facility]) ophthalmic ointment   Both Eyes Daily PRN Oswald Hillock, MD        PE: General appearance: alert, cooperative and no distress Lungs: clear to auscultation bilaterally Heart: regular rate  and rhythm, S1, S2 normal, no murmur, click, rub or gallop Abdomen: +BS.  Nontender Extremities: No LEE Pulses: 2+ and symmetric Skin: Warm and dry Neurologic: Grossly normal  Lab Results:   Recent Labs  02/25/14 1106 02/26/14 0624  WBC 11.6* 6.5  HGB 13.6 12.3  HCT 41.6 37.7  PLT 177 160   BMET  Recent Labs  02/25/14 1106 02/26/14 0624  NA 144 143  K 3.9 4.4  CL 103 108  CO2 23 21  GLUCOSE 274* 113*  BUN 46* 33*  CREATININE 1.90* 1.23*  CALCIUM 9.1 8.8    Assessment/Plan  78 y/o F with history of chronic diastolic CHF, dementia, PAF (see below), prior DVT, remote breast cancer, lung CA s/p RULectomy 2014, moderate pulm HTN, chronic pain on scheduled morphine who presented to [facility] with a fall and recent confusion. She was last seen by Dr. Aundra Dubin 02/13/14 for multifactorial dyspnea in the office, suspected multifactorial from dCHF, deconditioning, lung surgery, and Lasix was changed to 40mg  qAM, 20mg  QPM (from 40mg  daily).  Principal Problem:   Dehydration  Active Problems:   NSTEMI Peak troponin 1.77.  Possibly from demand ischemia from acute resp failure and hypoxia.  BP stable.  2D echo pending.    CT head suggests of tiny amount of subarachnoid blood versus tiny hemorrhagic contusion medial left parietal lobe.  Avoiding anticoagulation.  ASA 81    Acute respiratory Failure  D-Dimer elevated 11.85.  Would recommend V/Q scan given recent AKI to look for PE.  Hx of DVT.        Hx of PAF:  Maintaining SR on tele with some sinus tach this morning.  Probably during ambulation.  4 beat run of NSVT. Overall a stable, controlled trend.  TSH WNL in November.      Sepsis  Blood cultures pending.    AKI (acute kidney injury)  SCr improving.  Net fluids: +9.5F    Diastolic CHF, chronic  Fluid status appears improved.  No volume overload.    Dementia  More confused lately.  Improved    Chronic Pain from DDD   H/o remote breast CA tx with  surgery/chemo/radiation in 2002, [place] lung cancer s/p RULectomy 2014.    LOS: 1 day    HAGER, BRYAN PA-C 02/26/2014 10:52 AM  Patient seen and examined. I agree with the assessment and plan as detailed above. See also my additional thoughts below.   Viewed all aspects of the note above. I spoke to the patient and other family members in the room. Her overall cardiac status is stable today. Plans are as outlined above.  Dola Argyle, MD, [facility] 02/26/2014 1:08 PM

## 2014-02-26 NOTE — Progress Notes (Addendum)
[facility] TEAM 1 - Stepdown/ICU TEAM Progress Note  ATAYA MURDY VQQ:[id number] DOB: 26-Jul-1936 DOA: 02/25/2014 PCP: Laurey Morale, MD  Admit HPI / Brief Narrative: 78 year old female who has a past medical history of Hyperlipidemia; Hypertension; Cancer; GERD; Diverticulosis; adenomatous colonic polyps; DVT (09-30-11); Arthritis; Hypothyroidism; Alz Dementia; Peripheral vascular disease; History of kidney stones; DDD; Asthma; and H/O vein stripping who presented to the ED with a chief complaint of fall in the bathroom. As per patient she went to the bathroom and felt dizzy and was unsteady on her feet and fell. She denied passing out, and family heard the fall they brought her to the hospital. As per family patient had not been doing well over the past 2 weeks, and had been more confused and unsteady on her feet. She had reportedly been eating well.  She was seen by Cardiology on 3/18, and her dose of Lasix was increased to 40 mg in the morning and 20 mg in the evening for worsening dyspnea. Her last creatinine on 3/19 who was 1.0. At the time of her admit her creatinine was found to be 1.90, w/ BUN 46. Patient was found to be hypothermic and hypotensive with rectal temperature of 95.6, mild elevation of troponin to 0.12. UA was clear, chest x-ray showed no infiltrate. WBC 11.6. She denied nausea, diarrhea. She denied any chest pain or shortness of breath. She had one episode of vomiting in the ambulance.  HPI/Subjective: Pt is alert and conversant at the time of my exam.  She reports a mild HA, but otherwise denies any complaints.  She denies cp, sob, or abdom pain.    Assessment/Plan:  Hypovolemic shock - Dehydration - Hypotension SBP 50 mm Hg at presentation - no clinical evidence to suggest sepsis - BP has normalized w/ simple volume resuscitation - possible simple over diuresis in setting of preload dependent Pulm HTN? - follow BP trend but slow IVF   Elevated d-dimer Large PE could  explain many of presenting sx - B legs are enlarged, but no unilateral edema noted - no chest pain to suggest PE - d-dimer elevated, but many things can cause this/not diagnostic - check B LE venous dopplers - if DVT noted, will have to consider IVC filter - CT angio chest not safe currently due to AKI - VQ scan would not answer if LE DVT present and would likely be indeterminate in setting of complex pulm hx   Hypothermia likely due to profound dehydration - resolved - no evidence of active infection at this time   Acute kidney injury  baseline Cr 1-1.2 - crt improving w/ fluid already   Mildly elevated troponin  As per Cards - likely due to stress of acute hypotension   Mild transaminitis likely very mild case of "shock liver" - recheck in AM   Chronic diastolic CHF Well compensated/dehydrated at present - slow IVF as noted above  Moderate pulm HTN  Parox Afib Rate currently well controlled - apparently was primarily only an issue during recent lung surgery   Small subarachnoid bleed asymptomatic - due to fall w/ probable striking of head on wall - avoid anticoagulation / stop ASA - recheck CT head in AM in f/u   Hx NSC Lung CA s/p RUL lobectomy 2014  HLD  GERD  Hx of R LE DVT 2012 See discussion above   Hypothyroidism Cont synthroid - check TSH  Alz Dementia  Hx of PVD  Code Status: FULL Family Communication: no family present at  time of exam Disposition Plan: stable for tele bed   Consultants: Cardiology NS - via phone only   Procedures: none  Antibiotics: Aztreonam 3/30 Levaquin 3/30 Vanc 3/30  DVT prophylaxis: SCDs  Objective: Blood pressure 109/86, pulse 86, temperature 97.4 F (36.3 C), temperature source Oral, resp. rate 21, height 5\' 3"  (1.6 m), weight 87.2 kg (192 lb 3.9 oz), SpO2 98.00%.  Intake/Output Summary (Last 24 hours) at 02/26/14 1330 Last data filed at 02/26/14 0900  Gross per 24 hour  Intake    880 ml  Output    125 ml  Net     755 ml   Exam: General: No acute respiratory distress - alert - conversant  Lungs: Clear to auscultation bilaterally without wheezes or crackles Cardiovascular: Regular rate and rhythm without murmur gallop or rub  Abdomen: Nontender, nondistended, soft, bowel sounds positive, no rebound, no ascites, no appreciable mass Extremities: No significant cyanosis, clubbing, or edema bilateral lower extremities   Data Reviewed: Basic Metabolic Panel:  Recent Labs Lab 02/25/14 1106 02/26/14 0624  NA 144 143  K 3.9 4.4  CL 103 108  CO2 23 21  GLUCOSE 274* 113*  BUN 46* 33*  CREATININE 1.90* 1.23*  CALCIUM 9.1 8.8   Liver Function Tests:  Recent Labs Lab 02/25/14 1106 02/26/14 0624  AST 64* 53*  ALT 34 43*  ALKPHOS 100 96  BILITOT 1.1 1.0  PROT 7.3 6.3  ALBUMIN 3.7 3.0*   CBC:  Recent Labs Lab 02/25/14 1106 02/26/14 0624  WBC 11.6* 6.5  NEUTROABS 7.5  --   HGB 13.6 12.3  HCT 41.6 37.7  MCV 90.6 89.5  PLT 177 160   Cardiac Enzymes:  Recent Labs Lab 02/25/14 1813 02/25/14 2305 02/26/14 0624  TROPONINI 0.96* 1.77* 0.84*   BNP (last 3 results)  Recent Labs  08/20/13 0912 02/13/14 1532  PROBNP 73.0 212.0*   CBG:  Recent Labs Lab 02/25/14 1439  GLUCAP 71    Recent Results (from the past 240 hour(s))  CULTURE, BLOOD (ROUTINE X 2)     Status: None   Collection Time    02/25/14 11:30 AM      Result Value Ref Range Status   Specimen Description BLOOD HAND RIGHT   Final   Special Requests BOTTLES DRAWN AEROBIC AND ANAEROBIC 4CC   Final   Culture  Setup Time     Final   Value: 02/25/2014 16:34     Performed at [facility]   Culture     Final   Value:        BLOOD CULTURE RECEIVED NO GROWTH TO DATE CULTURE WILL BE HELD FOR 5 DAYS BEFORE ISSUING A FINAL NEGATIVE REPORT     Performed at [facility]   Report Status PENDING   Incomplete  CULTURE, BLOOD (ROUTINE X 2)     Status: None   Collection Time    02/25/14 11:40 AM      Result  Value Ref Range Status   Specimen Description BLOOD HAND LEFT   Final   Special Requests BOTTLES DRAWN AEROBIC ONLY 3CC   Final   Culture  Setup Time     Final   Value: 02/25/2014 16:34     Performed at [facility]   Culture     Final   Value:        BLOOD CULTURE RECEIVED NO GROWTH TO DATE CULTURE WILL BE HELD FOR 5 DAYS BEFORE ISSUING A FINAL NEGATIVE REPORT  Performed at [facility]   Report Status PENDING   Incomplete  MRSA PCR SCREENING     Status: None   Collection Time    02/25/14  5:44 PM      Result Value Ref Range Status   MRSA by PCR NEGATIVE  NEGATIVE Final   Comment:            The GeneXpert MRSA Assay (FDA     approved for NASAL specimens     only), is one component of a     comprehensive MRSA colonization     surveillance program. It is not     intended to diagnose MRSA     infection nor to guide or     monitor treatment for     MRSA infections.     Studies:  Recent x-ray studies have been reviewed in detail by the Attending Physician  Scheduled Meds:  Scheduled Meds: . aspirin EC  81 mg Oral Daily  . donepezil  10 mg Oral QHS  . feeding supplement (ENSURE COMPLETE)  237 mL Oral Q1500  . levothyroxine  137 mcg Oral QAC breakfast    Time spent on care of this patient: 35 mins   [place]  [phone] Pager - Text Page per Shea Evans as per below:  On-Call/Text Page:      [email or web address]      password [email or web address]  If 7PM-7AM, please contact night-coverage [email or web address] Password [email or web address] 02/26/2014, 1:30 PM   LOS: 1 day

## 2014-02-26 NOTE — Progress Notes (Signed)
Inpatient Diabetes Program Recommendations  AACE/ADA: New Consensus Statement on Inpatient Glycemic Control (2013)  Target Ranges:  Prepandial:   less than 140 mg/dL      Peak postprandial:   less than 180 mg/dL (1-2 hours)      Critically ill patients:  140 - 180 mg/dL   Results for Alexa [name], Alexa [name] (MRN [id number]) as of 02/26/2014 10:12  Ref. Range 02/25/2014 11:06 02/26/2014 06:24  Glucose Latest Range: 70-99 mg/dL 274 (H) 113 (H)   Diabetes history: NO Outpatient Diabetes medications: NA Current orders for Inpatient glycemic control: None  Inpatient Diabetes Program Recommendations HgbA1C: May want to order an A1C to evaluate glycemic control over the past 2-3 months.   Note: Patient does not have [name] history of diabetes.  However, noted initial lab glucose of 274 mg/dl at 11:06 on 3/30. Fasting lab glucose this morning is 113 mg/dl. May want to order an A1C to evaluate glycemic control over the past 2-3 months if appropriate.   Thanks, Barnie Alderman, RN, MSN, CCRN Diabetes Coordinator Inpatient Diabetes Program [phone] (Team Pager) [phone] (AP office) [phone] [facility] office)

## 2014-02-26 NOTE — Progress Notes (Signed)
Shift note: Pt trns from 2. Pt denies needs at this time. Pt confused. Family at bedside and states this in baseline. No s/s of distress noted. Will continue to monitor.

## 2014-02-26 NOTE — Progress Notes (Addendum)
INITIAL NUTRITION ASSESSMENT  DOCUMENTATION CODES Per approved criteria  -Obesity Unspecified   INTERVENTION: Ensure Complete po daily, each supplement provides 350 kcal and 13 grams of protein RD to follow for nutrition care plan  NUTRITION DIAGNOSIS: Inadequate oral intake related to acute illness as evidenced by daughter report  Goal: Pt to meet >/= 90% of their estimated nutrition needs   Monitor:  PO & supplemental intake, weight, labs, I/O's  Reason for Assessment: Malnutrition Screening Tool Report  78 y.o. female  Admitting Dx: Dehydration  ASSESSMENT: 78 year old female with PMH of HTN, GERD, cancer, dementia, DDD; presented to ED chief complaint of fall in the bathroom; also was found to be hypothermic and hypotensive.  RD spoke with pt's daughter at bedside; reports pt's appetite a bit better but appetite was decreased for ~ 2 weeks PTA; states she was consuming 1 meal per day and that she was "nibbling" on her food; daughter also reports a 15 lb weight loss in the past month, however, wt readings do no reflect this; would benefit from oral nutrition supplement -- RD to order.  Nutrition focused physical exam completed.  No muscle or subcutaneous fat depletion noticed.  Height: Ht Readings from Last 1 Encounters:  02/25/14 5\' 3"  (1.6 m)    Weight: Wt Readings from Last 1 Encounters:  02/26/14 192 lb 3.9 oz (87.2 kg)    Ideal Body Weight: 115 lb  % Ideal Body Weight: 166%  Wt Readings from Last 10 Encounters:  02/26/14 192 lb 3.9 oz (87.2 kg)  02/13/14 190 lb (86.183 kg)  02/01/14 196 lb (88.905 kg)  01/09/14 196 lb (88.905 kg)  11/12/13 186 lb (84.369 kg)  11/06/13 193 lb 6.4 oz (87.726 kg)  10/02/13 188 lb (85.276 kg)  09/26/13 186 lb 12.8 oz (84.732 kg)  08/28/13 181 lb (82.101 kg)  08/23/13 180 lb (81.647 kg)    Usual Body Weight: 196 lb  % Usual Body Weight: 98%  BMI:  Body mass index is 34.06 kg/(m^2).  Estimated Nutritional  Needs: Kcal: 1700-1900 Protein: 80-90 gm Fluid: 1.7-1.9 L  Skin: Intact  Diet Order: General  EDUCATION NEEDS: -No education needs identified at this time   Intake/Output Summary (Last 24 hours) at 02/26/14 1218 Last data filed at 02/26/14 0900  Gross per 24 hour  Intake    880 ml  Output    125 ml  Net    755 ml    Labs:   Recent Labs Lab 02/25/14 1106 02/26/14 0624  NA 144 143  K 3.9 4.4  CL 103 108  CO2 23 21  BUN 46* 33*  CREATININE 1.90* 1.23*  CALCIUM 9.1 8.8  GLUCOSE 274* 113*    CBG (last 3)   Recent Labs  02/25/14 1439  GLUCAP 71    Scheduled Meds: . aspirin EC  81 mg Oral Daily  . donepezil  10 mg Oral QHS  . levothyroxine  137 mcg Oral QAC breakfast    Continuous Infusions:   Past Medical History  Diagnosis Date  . Hyperlipidemia     a. Myalgias with multiple statins.  . Hypertension   . Adenocarcinoma of breast     a. s/p right simple and left modified radical mastectomy with chemotherapy and radiation in 2002.   Marland Kitchen GERD (gastroesophageal reflux disease)   . Diverticulosis   . Hx of adenomatous colonic polyps   . DVT (deep venous thrombosis) 09-30-11    a. Right lower leg 09/2011.  . Arthritis   .  Hypothyroidism   . Dementia     a. sees Dr. Brett Fairy. b. on Aricept.  Marland Kitchen History of kidney stones   . DDD (degenerative disc disease)     a. cervical and lumbar herniation and DJD s/p multiple diskectomies and other back surgeries from 2002-2008   . Asthma   . H/O vein stripping   . History of tobacco abuse     a. quit 25+ years ago. PFTs in 2/15 were relatively normal.   . Chronic diastolic CHF (congestive heart failure)     a. ETT-myoview (9/12) with 4' exercise, EF 68%, no ischemia or infarction. b. Echo (10/14) with EF 55-60%, mod diast dysfunction, normal RV size/fcn, mod pulm HTN.   . Venous insufficiency     a. H/o venous stasis ulceration.   Marland Kitchen PAF (paroxysmal atrial fibrillation)     a. Only episode that noted was in setting  of right upper lobectomy in 2014. She was briefly on amiodarone but stopped it on her own. She is not coumadin. If recurrent AF, will need to consider.  . Non-small cell lung cancer     a. s/p right upper lobectomy in 2014.   . Pulmonary HTN     a. Moderate by echo 08/2013.  Marland Kitchen Cerebral infarct     a.  Remote anterior left frontal lobe infarct by CT head 01/2014.    Past Surgical History  Procedure Laterality Date  . Cholecystectomy  2007  . Breast surgery  2002    BL mastectomy  . Lumbar laminectomy      x3  . Rotator cuff repair Right 10  . Cervical spine surgery    . Hip surgery    . Spine surgery    . Abdominal hysterectomy    . Fiberoptic bronchoscopy with electromagnetic  10/07/2010  . Video bronchoscopy N/A 05/14/2013    Procedure: VIDEO BRONCHOSCOPY;  Surgeon: Grace Isaac, MD;  Location: [facility] OR;  Service: Thoracic;  Laterality: N/A;  . Video assisted thoracoscopy (vats)/ lobectomy Right 05/14/2013    Procedure: VIDEO ASSISTED THORACOSCOPY (VATS)/ LOBECTOMY;  Surgeon: Grace Isaac, MD;  Location: [facility];  Service: Thoracic;  Laterality: Right;  . Colonoscopy  02-18-09    per Dr. Fuller Plan, adenomatous polyp, repeat in 5 yrs   . Neck-acdf    . Varicose vein surgery    . Hemorrhoid surgery  1985  . Kidney stone surgery  [place], [place], LDN Pager #: [phone] After-Hours Pager #: [phone]

## 2014-02-27 ENCOUNTER — Encounter (HOSPITAL_COMMUNITY): Payer: Self-pay | Admitting: *Deleted

## 2014-02-27 ENCOUNTER — Inpatient Hospital Stay (HOSPITAL_COMMUNITY): Payer: Medicare Other

## 2014-02-27 ENCOUNTER — Other Ambulatory Visit: Payer: Medicare Other

## 2014-02-27 DIAGNOSIS — R9431 Abnormal electrocardiogram [ECG] [EKG]: Secondary | ICD-10-CM

## 2014-02-27 DIAGNOSIS — R079 Chest pain, unspecified: Secondary | ICD-10-CM

## 2014-02-27 DIAGNOSIS — I82409 Acute embolism and thrombosis of unspecified deep veins of unspecified lower extremity: Secondary | ICD-10-CM

## 2014-02-27 LAB — BASIC METABOLIC PANEL
BUN: 27 mg/dL — ABNORMAL HIGH (ref 6–23)
CALCIUM: 9.1 mg/dL (ref 8.4–10.5)
CO2: 20 meq/L (ref 19–32)
CREATININE: 1.05 mg/dL (ref 0.50–1.10)
Chloride: 105 mEq/L (ref 96–112)
GFR calc non Af Amer: 51 mL/min — ABNORMAL LOW (ref >90)
GFR, EST AFRICAN AMERICAN: 59 mL/min — AB (ref >90)
Glucose, Bld: 125 mg/dL — ABNORMAL HIGH (ref 70–99)
Potassium: 5.5 mEq/L — ABNORMAL HIGH (ref 3.7–5.3)
Sodium: 141 mEq/L (ref 137–147)

## 2014-02-27 LAB — HEPATIC FUNCTION PANEL
ALT: 39 U/L — ABNORMAL HIGH (ref 0–35)
AST: 55 U/L — ABNORMAL HIGH (ref 0–37)
Albumin: 3.2 g/dL — ABNORMAL LOW (ref 3.5–5.2)
Alkaline Phosphatase: 97 U/L (ref 39–117)
Bilirubin, Direct: <0.2 mg/dL (ref 0.0–0.3)
TOTAL PROTEIN: 7.2 g/dL (ref 6.0–8.3)
Total Bilirubin: 1 mg/dL (ref 0.3–1.2)

## 2014-02-27 LAB — CBC
HCT: 41.3 % (ref 36.0–46.0)
Hemoglobin: 13.8 g/dL (ref 12.0–15.0)
MCH: 29.6 pg (ref 26.0–34.0)
MCHC: 33.4 g/dL (ref 30.0–36.0)
MCV: 88.6 fL (ref 78.0–100.0)
Platelets: 172 10*3/uL (ref 150–400)
RBC: 4.66 MIL/uL (ref 3.87–5.11)
RDW: 14.5 % (ref 11.5–15.5)
WBC: 9.1 10*3/uL (ref 4.0–10.5)

## 2014-02-27 LAB — TROPONIN I
TROPONIN I: 0.76 ng/mL — AB (ref <0.30)
Troponin I: 0.39 ng/mL (ref <0.30)
Troponin I: 0.57 ng/mL (ref <0.30)

## 2014-02-27 LAB — HEMOGLOBIN A1C
Hgb A1c MFr Bld: 5.7 % — ABNORMAL HIGH (ref <5.7)
Mean Plasma Glucose: 117 mg/dL — ABNORMAL HIGH (ref <117)

## 2014-02-27 LAB — HEPARIN LEVEL (UNFRACTIONATED): Heparin Unfractionated: 0.2 IU/mL — ABNORMAL LOW (ref 0.30–0.70)

## 2014-02-27 MED ORDER — ISOSORBIDE MONONITRATE ER 30 MG PO TB24
30.0000 mg | ORAL_TABLET | Freq: Every day | ORAL | Status: DC
  Administered 2014-02-27 – 2014-02-28 (×2): 30 mg via ORAL
  Filled 2014-02-27 (×3): qty 1

## 2014-02-27 MED ORDER — HEPARIN BOLUS VIA INFUSION
4000.0000 [IU] | Freq: Once | INTRAVENOUS | Status: AC
  Administered 2014-02-27: 4000 [IU] via INTRAVENOUS
  Filled 2014-02-27: qty 4000

## 2014-02-27 MED ORDER — NITROGLYCERIN IN D5W 200-5 MCG/ML-% IV SOLN
5.0000 ug/min | INTRAVENOUS | Status: DC
  Administered 2014-02-27: 5 ug/min via INTRAVENOUS
  Filled 2014-02-27: qty 250

## 2014-02-27 MED ORDER — HEPARIN (PORCINE) IN NACL 100-0.45 UNIT/ML-% IJ SOLN
1100.0000 [IU]/h | INTRAMUSCULAR | Status: DC
  Administered 2014-02-27: 850 [IU]/h via INTRAVENOUS
  Administered 2014-02-27: 1000 [IU]/h via INTRAVENOUS
  Administered 2014-02-28: 1100 [IU]/h via INTRAVENOUS
  Filled 2014-02-27 (×5): qty 250

## 2014-02-27 MED ORDER — IOHEXOL 350 MG/ML SOLN
80.0000 mL | Freq: Once | INTRAVENOUS | Status: AC | PRN
  Administered 2014-02-27: 100 mL via INTRAVENOUS

## 2014-02-27 MED ORDER — PANTOPRAZOLE SODIUM 40 MG PO TBEC
40.0000 mg | DELAYED_RELEASE_TABLET | Freq: Every day | ORAL | Status: DC
  Administered 2014-02-27 – 2014-03-04 (×6): 40 mg via ORAL
  Filled 2014-02-27 (×6): qty 1

## 2014-02-27 NOTE — Progress Notes (Signed)
ANTICOAGULATION CONSULT NOTE - Follow Up Consult  Pharmacy Consult for heparin Indication: pulmonary embolus, DVT and NSTEMI  Labs:  Recent Labs  02/25/14 1106  02/26/14 0624 02/27/14 0521 02/27/14 1140 02/27/14 1640 02/27/14 2255  HGB 13.6  --  12.3 13.8  --   --   --   HCT 41.6  --  37.7 41.3  --   --   --   PLT 177  --  160 172  --   --   --   HEPARINUNFRC  --   --   --   --   --   --  0.20*  CREATININE 1.90*  --  1.23* 1.05  --   --   --   TROPONINI  --   < > 0.84*  --  0.76* 0.39*  --   < > = values in this interval not displayed.   Assessment: 78yo female subtherapeutic on heparin with initial dosing for NSTEMI; now DVT and PE w/ right heart strain have been confirmed; pt has small subarachnoid hemorrhage, seen by neurosurgery who believes the benefit of anticoagulation exceeds the risk; will lower goal heparin level for now and avoid boluses of heparin.  Goal of Therapy:  Heparin level 0.3-0.5 units/ml   Plan:  Will increase heparin gtt by 2 units/kg/hr to 1000 units/hr and check level in [place], PharmD, BCPS  02/27/2014,11:39 PM

## 2014-02-27 NOTE — Progress Notes (Signed)
Pt transferred to room 2C-16 via bed accompanied by staff and family. Pt will be on nitro and heparin drip.

## 2014-02-27 NOTE — Progress Notes (Signed)
Progress Note  LUV MISH URK:[id number] DOB: June 26, 1936 DOA: 02/25/2014 PCP: Laurey Morale, MD  Admit HPI / Brief Narrative: 78 year old female who has a past medical history of Hyperlipidemia; Hypertension; Cancer; GERD; Diverticulosis; adenomatous colonic polyps; DVT (09-30-11); Arthritis; Hypothyroidism; Alz Dementia; Peripheral vascular disease; History of kidney stones; DDD; Asthma; and H/O vein stripping who presented to the ED with a chief complaint of fall in the bathroom. As per patient she went to the bathroom and felt dizzy and was unsteady on her feet and fell. She denied passing out, and family heard the fall they brought her to the hospital. As per family patient had not been doing well over the past 2 weeks, and had been more confused and unsteady on her feet. She had reportedly been eating well.  She was seen by Cardiology on 3/18, and her dose of Lasix was increased to 40 mg in the morning and 20 mg in the evening for worsening dyspnea. Her last creatinine on 3/19 who was 1.0. At the time of her admit her creatinine was found to be 1.90, w/ BUN 46. Patient was found to be hypothermic and hypotensive with rectal temperature of 95.6, mild elevation of troponin to 0.12. UA was clear, chest x-ray showed no infiltrate. WBC 11.6. She denied nausea, diarrhea. She denied any chest pain or shortness of breath. She had one episode of vomiting in the ambulance. She was admitted for further evaluation and management.  HPI/Subjective: Complaining of chest tightness since last p.m.-states slightly better today but persisting. She denies nausea vomiting and no shortness of breath  Assessment/Plan: NSTEMI with continued CP -Per cardiology-was initially thought to be a demand ischemia, and on followup today patient with chest pain persistent since last PM -Repeat EKG done and per cards abnormal anterior T waves noted and theST changes more marked than in the past -Started on heparin and  nitroglycerin nitro drip per cards and transferred to step down -Consulted and discussed patient with neurosurgery/Dr. Arnoldo Morale in the light of her small traumatic subarachnoid bleed sustained prior to this admission and he recommends for treating MDs to weigh the risks versus benefits and that under the circumstances however, if he were the patient, he would accept the small risk of worsening intracranial hemorrhage with heparin anticoagulated for the benefit of limiting cardiac damage with an acute coronary syndrome.  Hypovolemic shock - Dehydration - Hypotension SBP 50 mm Hg at presentation - no clinical evidence to suggest sepsis - BP has normalized w/ simple volume resuscitation - possible simple over diuresis in setting of preload dependent Pulm HTN?  -BP remained stable off IV fluids - Elevated d-dimer/acute to subacute left lower extremity DVT Large PE could explain many of presenting sx - B legs are enlarged, but no unilateral edema noted -Doppler ultrasound of lower extremities office 3/31 with acute to subacute DVT involving the left lower extremity - Patient now with chest pain and d-dimer markedly elevated  On 3/31 -Her renal function has normalized will obtain CT and round to further evaluate For possible PE -Had been started per cards as above, however if it becomes necessary to discontinue the heparin anticoagulation patient would need an IVC filter given her small subarachnoid bleed as above. Hypothermia likely due to profound dehydration - resolved - no evidence of active infection at this time   Acute kidney injury  baseline Cr 1-1.2 - crt improved w/ fluids -Creatinine normalized to 1.05 today   Mild transaminitis likely very mild case of "shock liver" -  recheck in AM  ALT trending down, no significant change in the ASTChronic diastolic CHF Well compensated/dehydrated at present - slow IVF as noted above  Moderate pulm HTN  Parox Afib Rate currently well controlled -  apparently was primarily only an issue during recent lung surgery   Small subarachnoid bleed -Followup CT scan today shows interval improvement, no new area of hemorrhage or infarction -Please see as discussed above with neurosurgery consulted today/ Dr. Arnoldo Morale -Continue to monitor her closely.  Hx [facility] CA s/p RUL lobectomy 2014  HLD  GERD -Place on PPI   Hx of R LE DVT 2012 See discussion above   Hypothyroidism Cont synthroid - check TSH  Alz Dementia  Hx of PVD  Code Status: FULL Family Communication:  daughter is present at bedside  Disposition Plan:  transferred back to step down   Consultants: Cardiology NS - via phone only   Procedures: none  Antibiotics: Aztreonam 3/30 Levaquin 3/30 Vanc 3/30  DVT prophylaxis: SCDs  Objective: Blood pressure 147/86, pulse 84, temperature 98.5 F (36.9 C), temperature source Oral, resp. rate 23, height 5\' 3"  (1.6 m), weight 86.955 kg (191 lb 11.2 oz), SpO2 96.00%.  Intake/Output Summary (Last 24 hours) at 02/27/14 1840 Last data filed at 02/27/14 1347  Gross per 24 hour  Intake    360 ml  Output      0 ml  Net    360 ml   Exam: General: No acute respiratory distress - alert - conversant  Lungs: Clear to auscultation bilaterally without wheezes or crackles Cardiovascular: Regular rate and rhythm without murmur gallop or rub  Abdomen: Nontender, nondistended, soft, bowel sounds positive, no rebound, no ascites, no appreciable mass Extremities: No significant cyanosis, clubbing, or edema bilateral lower extremities   Data Reviewed: Basic Metabolic Panel:  Recent Labs Lab 02/25/14 1106 02/26/14 0624 02/27/14 0521  NA 144 143 141  K 3.9 4.4 5.5*  CL 103 108 105  CO2 23 21 20   GLUCOSE 274* 113* 125*  BUN 46* 33* 27*  CREATININE 1.90* 1.23* 1.05  CALCIUM 9.1 8.8 9.1   Liver Function Tests:  Recent Labs Lab 02/25/14 1106 02/26/14 0624 02/27/14 0521  AST 64* 53* 55*  ALT 34 43* 39*  ALKPHOS 100  96 97  BILITOT 1.1 1.0 1.0  PROT 7.3 6.3 7.2  ALBUMIN 3.7 3.0* 3.2*   CBC:  Recent Labs Lab 02/25/14 1106 02/26/14 0624 02/27/14 0521  WBC 11.6* 6.5 9.1  NEUTROABS 7.5  --   --   HGB 13.6 12.3 13.8  HCT 41.6 37.7 41.3  MCV 90.6 89.5 88.6  PLT 177 160 172   Cardiac Enzymes:  Recent Labs Lab 02/25/14 1813 02/25/14 2305 02/26/14 0624 02/27/14 1140 02/27/14 1640  TROPONINI 0.96* 1.77* 0.84* 0.76* 0.39*   BNP (last 3 results)  Recent Labs  08/20/13 0912 02/13/14 1532  PROBNP 73.0 212.0*   CBG:  Recent Labs Lab 02/25/14 1439  GLUCAP 71    Recent Results (from the past 240 hour(s))  CULTURE, BLOOD (ROUTINE X 2)     Status: None   Collection Time    02/25/14 11:30 AM      Result Value Ref Range Status   Specimen Description BLOOD HAND RIGHT   Final   Special Requests BOTTLES DRAWN AEROBIC AND ANAEROBIC 4CC   Final   Culture  Setup Time     Final   Value: 02/25/2014 16:34     Performed at [facility]  Culture     Final   Value:        BLOOD CULTURE RECEIVED NO GROWTH TO DATE CULTURE WILL BE HELD FOR 5 DAYS BEFORE ISSUING A FINAL NEGATIVE REPORT     Performed at [facility]   Report Status PENDING   Incomplete  CULTURE, BLOOD (ROUTINE X 2)     Status: None   Collection Time    02/25/14 11:40 AM      Result Value Ref Range Status   Specimen Description BLOOD HAND LEFT   Final   Special Requests BOTTLES DRAWN AEROBIC ONLY 3CC   Final   Culture  Setup Time     Final   Value: 02/25/2014 16:34     Performed at [facility]   Culture     Final   Value:        BLOOD CULTURE RECEIVED NO GROWTH TO DATE CULTURE WILL BE HELD FOR 5 DAYS BEFORE ISSUING A FINAL NEGATIVE REPORT     Performed at [facility]   Report Status PENDING   Incomplete  URINE CULTURE     Status: None   Collection Time    02/25/14 12:00 PM      Result Value Ref Range Status   Specimen Description URINE, RANDOM   Final   Special Requests NONE   Final    Culture  Setup Time     Final   Value: 02/25/2014 17:41     Performed at [facility]     Final   Value: NO GROWTH     Performed at [facility]   Culture     Final   Value: NO GROWTH     Performed at [facility]   Report Status 02/26/2014 FINAL   Final  MRSA PCR SCREENING     Status: None   Collection Time    02/25/14  5:44 PM      Result Value Ref Range Status   MRSA by PCR NEGATIVE  NEGATIVE Final   Comment:            The GeneXpert MRSA Assay (FDA     approved for NASAL specimens     only), is one component of a     comprehensive MRSA colonization     surveillance program. It is not     intended to diagnose MRSA     infection nor to guide or     monitor treatment for     MRSA infections.     Studies:  Recent x-ray studies have been reviewed in detail by the Attending Physician  Scheduled Meds:  Scheduled Meds: . donepezil  10 mg Oral QHS  . feeding supplement (ENSURE COMPLETE)  237 mL Oral Q1500  . isosorbide mononitrate  30 mg Oral Daily  . levothyroxine  137 mcg Oral QAC breakfast    Time spent on care of this patient: 35 mins   [place]  [phone] Pager - Text Page per Shea Evans as per below:  On-Call/Text Page:      [email or web address]      password [email or web address]  If 7PM-7AM, please contact night-coverage [email or web address] Password [facility] 02/27/2014, 6:40 PM   LOS: 2 days  and the and 30

## 2014-02-27 NOTE — Progress Notes (Signed)
Patient ID: Alexa [name], female   DOB: 1935-12-15, 78 y.o.   MRN: [id number]    SUBJECTIVE:  I was called to see the patient immediately concerning chest pain. Her echo from yesterday showed excellent LV function. Her symptoms are very difficult to assess at this time. Her EKG does show anterior T waves that are abnormal. She has had this intermittently in the past. The current ST changes may be more marked in the past.   Filed Vitals:   02/27/14 0516 02/27/14 0919 02/27/14 0935 02/27/14 0950  BP: 178/90 105/52 124/111 144/116  Pulse: 79 85 86 84  Temp: 97.5 F (36.4 C) 98.8 F (37.1 C)    TempSrc: Oral Oral    Resp: 20 18 22 20   Height:      Weight:      SpO2: 94% 100% 98% 97%     Intake/Output Summary (Last 24 hours) at 02/27/14 1028 Last data filed at 02/27/14 0954  Gross per 24 hour  Intake    120 ml  Output      0 ml  Net    120 ml    LABS: Basic Metabolic Panel:  Recent Labs  02/26/14 0624 02/27/14 0521  NA 143 141  K 4.4 5.5*  CL 108 105  CO2 21 20  GLUCOSE 113* 125*  BUN 33* 27*  CREATININE 1.23* 1.05  CALCIUM 8.8 9.1   Liver Function Tests:  Recent Labs  02/26/14 0624 02/27/14 0521  AST 53* 55*  ALT 43* 39*  ALKPHOS 96 97  BILITOT 1.0 1.0  PROT 6.3 7.2  ALBUMIN 3.0* 3.2*   No results found for this basename: LIPASE, AMYLASE,  in the last 72 hours CBC:  Recent Labs  02/25/14 1106 02/26/14 0624 02/27/14 0521  WBC 11.6* 6.5 9.1  NEUTROABS 7.5  --   --   HGB 13.6 12.3 13.8  HCT 41.6 37.7 41.3  MCV 90.6 89.5 88.6  PLT 177 160 172   Cardiac Enzymes:  Recent Labs  02/25/14 1813 02/25/14 2305 02/26/14 0624  TROPONINI 0.96* 1.77* 0.84*   BNP: No components found with this basename: POCBNP,  D-Dimer:  Recent Labs  02/26/14 0918  DDIMER 11.85*   Hemoglobin A1C: No results found for this basename: HGBA1C,  in the last 72 hours Fasting Lipid Panel: No results found for this basename: CHOL, HDL, LDLCALC, TRIG, CHOLHDL,  LDLDIRECT,  in the last 72 hours Thyroid Function Tests: No results found for this basename: TSH, T4TOTAL, FREET3, T3FREE, THYROIDAB,  in the last 72 hours  RADIOLOGY: Dg Chest 1 View  02/25/2014   CLINICAL DATA:  Hypotension, confusion  EXAM: CHEST - 1 VIEW  COMPARISON:  06/21/2013, 02/14/2014  FINDINGS: Postsurgical changes are noted in the thoracolumbar spine. Cardiac shadow is prominent but stable in appearance. Postsurgical changes are seen in the right hilum. No focal infiltrate is noted. No acute abnormality is seen.  IMPRESSION: No acute abnormality noted.   Electronically Signed   By: Inez Catalina M.D.   On: 02/25/2014 13:44   Ct Head Wo Contrast  02/27/2014   CLINICAL DATA:  Follow-up subarachnoid hemorrhage  EXAM: CT HEAD WITHOUT CONTRAST  TECHNIQUE: Contiguous axial images were obtained from the base of the skull through the vertex without intravenous contrast.  COMPARISON:  CT head 02/25/2014  FINDINGS: Interval improvement in small amount of subarachnoid hemorrhage in the left parietal region. This is most likely a posttraumatic hemorrhage given its location in size. No subdural hemorrhage.  Mild  atrophy. 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 No acute ischemic infarct. No mass or edema or midline shift. Negative for skull fracture.  IMPRESSION: Small amount of subarachnoid hemorrhage posteriorly on the left shows interval improvement. No new area of hemorrhage or infarction.   Electronically Signed   By: Franchot Gallo M.D.   On: 02/27/2014 09:09   Ct Head Wo Contrast  02/25/2014   CLINICAL DATA:  Weakness. Unconscious. Questionable trauma. Dementia.  EXAM: CT HEAD WITHOUT CONTRAST  CT CERVICAL SPINE WITHOUT CONTRAST  TECHNIQUE: Multidetector CT imaging of the head and cervical spine was performed following the standard protocol without intravenous contrast. Multiplanar CT image reconstructions of the cervical spine were also generated.  COMPARISON:  05/16/2008 head CT.   FINDINGS: CT HEAD FINDINGS  No skull fracture.  Suggestion of tiny amount of subarachnoid blood versus tiny hemorrhagic contusion medial left parietal lobe (series 3, image 24 and 25).  Anterior left frontal lobe encephalomalacia suggestive of prior infarct.  No CT evidence of large acute infarct.  Global atrophy without hydrocephalus.  No intracranial mass lesion noted on this unenhanced exam.  CT CERVICAL SPINE FINDINGS  No cervical spine fracture.  Mild motion artifact C2 level.  No abnormal prevertebral soft tissue swelling.  Prior fusion C4-C7.  Cervical spondylotic changes most notable C3-4.  Post therapy type changes right upper lung.  IMPRESSION: CT head:  Suggestion of tiny amount of subarachnoid blood versus tiny hemorrhagic contusion medial left parietal lobe (series 3, image 24 and 25).  Remote anterior left frontal lobe infarct.  Atrophy.  CT cervical spine:  No cervical spine fracture.  Prior fusion C4-C7.  Cervical spondylotic changes most notable C3-4.  These results were called by telephone at the time of interpretation on 02/25/2014 at 1:33 PM to Dr. Riki Altes , who verbally acknowledged these results.   Electronically Signed   By: Chauncey Cruel M.D.   On: 02/25/2014 13:35   Ct Chest W Contrast  02/14/2014   CLINICAL DATA:  Lung cancer with partial right lung resection. Radiation therapy complete. Prior history of breast cancer.  EXAM: CT CHEST WITH CONTRAST  TECHNIQUE: Multidetector CT imaging of the chest was performed during intravenous contrast administration.  CONTRAST:  43mL OMNIPAQUE IOHEXOL 300 MG/ML  SOLN  COMPARISON:  DG CHEST 2 VIEW dated 06/21/2013; NM PET IMAGE INITIAL (PI) SKULL BASE TO THIGH dated 04/09/2013; CT CHEST W/O CM dated 04/05/2013  FINDINGS: No axillary or supraclavicular lymphadenopathy. Small paratracheal lymph nodes are not pathologic by size criteria. Right hilar lymph node measures 12 mm (image 23, series 2. Esophagus is normal.  Review of the lung windows demonstrate  postsurgical change the right upper lobe. No nodularity. Left lung is clear. Airways are normal centrally.  Limited view of the upper abdomen demonstrates normal adrenal glands.  IMPRESSION: 1. No evidence level lung cancer recurrence. 2. Postoperative change in the right hemi thorax.   Electronically Signed   By: Suzy Bouchard M.D.   On: 02/14/2014 14:30   Ct Cervical Spine Wo Contrast  02/25/2014   CLINICAL DATA:  Weakness. Unconscious. Questionable trauma. Dementia.  EXAM: CT HEAD WITHOUT CONTRAST  CT CERVICAL SPINE WITHOUT CONTRAST  TECHNIQUE: Multidetector CT imaging of the head and cervical spine was performed following the standard protocol without intravenous contrast. Multiplanar CT image reconstructions of the cervical spine were also generated.  COMPARISON:  05/16/2008 head CT.  FINDINGS: CT HEAD FINDINGS  No skull fracture.  Suggestion of tiny amount of  subarachnoid blood versus tiny hemorrhagic contusion medial left parietal lobe (series 3, image 24 and 25).  Anterior left frontal lobe encephalomalacia suggestive of prior infarct.  No CT evidence of large acute infarct.  Global atrophy without hydrocephalus.  No intracranial mass lesion noted on this unenhanced exam.  CT CERVICAL SPINE FINDINGS  No cervical spine fracture.  Mild motion artifact C2 level.  No abnormal prevertebral soft tissue swelling.  Prior fusion C4-C7.  Cervical spondylotic changes most notable C3-4.  Post therapy type changes right upper lung.  IMPRESSION: CT head:  Suggestion of tiny amount of subarachnoid blood versus tiny hemorrhagic contusion medial left parietal lobe (series 3, image 24 and 25).  Remote anterior left frontal lobe infarct.  Atrophy.  CT cervical spine:  No cervical spine fracture.  Prior fusion C4-C7.  Cervical spondylotic changes most notable C3-4.  These results were called by telephone at the time of interpretation on 02/25/2014 at 1:33 PM to Dr. Riki Altes , who verbally acknowledged these results.    Electronically Signed   By: Chauncey Cruel M.D.   On: 02/25/2014 13:35    PHYSICAL EXAM  patient is lying flat in bed. She has family members in the room. Lungs reveal scattered rhonchi. Cardiac exam reveals S1-S2. Abdomen is soft. There is no peripheral edema.   TELEMETRY: I have reviewed telemetry today February 27, 2014. There is normal sinus rhythm.   ASSESSMENT AND PLAN:  Principal Problem:   Dehydration Active Problems:   Sepsis   AKI (acute kidney injury)   Diastolic CHF, chronic   Dementia    Chest pain   Abnormal EKG      Her chest discomfort is very difficult to assess. We need to rule out the possibility that she is having cardiac ischemia. However at this time I am not sure that her symptoms represent an acute coronary syndrome. I have ordered troponins. She will receive him to work. I will leave the rest of her workup to her primary care team for other possible etiologies of her chest pain.    Hypertension    Before admission she had been on an ARB for her blood pressure. Her blood pressure has been up and down this admission. At some point she will need to be placed back on antihypertensive meds. However with current chest pain, I will be adding a nitrate.   Dola Argyle 02/27/2014 10:28 AM

## 2014-02-27 NOTE — Progress Notes (Signed)
Dr. Thereasa Solo service called back ordered EKG and contact cardiology.  Paged Cardiology Dr. Salvatore Marvel, and [place], left page [phone]

## 2014-02-27 NOTE — Progress Notes (Signed)
Cardiolgy consulted.  EKG reviewed by cardiology, will order some medications.  Possible concern for PE.

## 2014-02-27 NOTE — Progress Notes (Signed)
Patient ID: KYMORAH KORF, female   DOB: 05-03-1936, 78 y.o.   MRN: [id number] I was asked by Dr.Viyuh, the hospitalist taking care of Mrs. Charlina Doyle, to comment on the patient's head CT and small subarachnoid hemorrhage as it pertains to heparin anticoagulation for an acute coronary syndrome. I discussed situation with Dr Randol Kern. I told her that the textbook answer is that a patient who has had a recent subarachnoid hemorrhage should not be anticoagulated. However under the circumstances, if I were the patient, I would except the small risk of worsening intracranial hemorrhage with heparin anticoagulated for the benefit of limiting cardiac damage with an acute coronary syndrome.  However this decision is up to the medical doctors and cardiologist who are caring for Mrs. Matas. Of course thrombolysis is out of the question.

## 2014-02-27 NOTE — Progress Notes (Signed)
Hinton Dyer returned page.  Spoke with cardiologist, transfer pt to step down and will be put on nito drip.

## 2014-02-27 NOTE — Progress Notes (Signed)
1st trop .43 Paged [place] [facility].

## 2014-02-27 NOTE — Progress Notes (Signed)
Pt bp 178/90 pt not sched any of her bp meds, pt also co chest tightness. Paged Dr. Hilbert Corrigan.  Pt gone down for ct of head w/o contrast.

## 2014-02-27 NOTE — Progress Notes (Signed)
Alexa [name] returned page and she will have someone round on her from cardiology shortly.

## 2014-02-27 NOTE — Progress Notes (Signed)
ANTICOAGULATION CONSULT NOTE - Initial Consult  Pharmacy Consult for heparin Indication: chest pain/ACS  Allergies  Allergen Reactions  . Strawberry Swelling    Swelling ,rash  . Hydrocodone-Homatropine     REACTION: Nausea  . Nsaids   . Penicillins     REACTION: rash  . Simvastatin     REACTION: muscle pain  . Statins     Myalgias with multiple statins     Patient Measurements: Height: 5\' 3"  (160 cm) Weight: 191 lb 11.2 oz (86.955 kg) IBW/kg (Calculated) : 52.4 Heparin Dosing Weight: 72 kg   Vital Signs: Temp: 98.5 F (36.9 C) (04/01 1346) Temp src: Oral (04/01 1346) BP: 114/68 mmHg (04/01 1346) Pulse Rate: 87 (04/01 1346)  Labs:  Recent Labs  02/25/14 1106  02/25/14 2305 02/26/14 0624 02/27/14 0521 02/27/14 1140  HGB 13.6  --   --  12.3 13.8  --   HCT 41.6  --   --  37.7 41.3  --   PLT 177  --   --  160 172  --   CREATININE 1.90*  --   --  1.23* 1.05  --   TROPONINI  --   < > 1.77* 0.84*  --  0.76*  < > = values in this interval not displayed.  Estimated Creatinine Clearance: 48.4 ml/min (by C-G formula based on Cr of 1.05).   Medical History: Past Medical History  Diagnosis Date  . Hyperlipidemia     a. Myalgias with multiple statins.  . Hypertension   . Adenocarcinoma of breast     a. s/p right simple and left modified radical mastectomy with chemotherapy and radiation in 2002.   Marland Kitchen GERD (gastroesophageal reflux disease)   . Diverticulosis   . Hx of adenomatous colonic polyps   . DVT (deep venous thrombosis) 09-30-11    a. Right lower leg 09/2011.  . Arthritis   . Hypothyroidism   . Dementia     a. sees Dr. Brett Fairy. b. on Aricept.  Marland Kitchen History of kidney stones   . DDD (degenerative disc disease)     a. cervical and lumbar herniation and DJD s/p multiple diskectomies and other back surgeries from 2002-2008   . Asthma   . H/O vein stripping   . History of tobacco abuse     a. quit 25+ years ago. PFTs in 2/15 were relatively normal.   .  Chronic diastolic CHF (congestive heart failure)     a. ETT-myoview (9/12) with 4' exercise, EF 68%, no ischemia or infarction. b. Echo (10/14) with EF 55-60%, mod diast dysfunction, normal RV size/fcn, mod pulm HTN.   . Venous insufficiency     a. H/o venous stasis ulceration.   Marland Kitchen PAF (paroxysmal atrial fibrillation)     a. Only episode that noted was in setting of right upper lobectomy in 2014. She was briefly on amiodarone but stopped it on her own. She is not coumadin. If recurrent AF, will need to consider.  . Non-small cell lung cancer     a. s/p right upper lobectomy in 2014.   . Pulmonary HTN     a. Moderate by echo 08/2013.  Marland Kitchen Cerebral infarct     a.  Remote anterior left frontal lobe infarct by CT head 01/2014.    Medications:  Heparin gtt  Assessment: 78 year old female with ACS rule-out.  Troponins (+) x3.  Renal function stable.  CBC stable.  Will begin heparin drip.  Goal of Therapy:  Heparin level 0.3-0.7  units/ml Monitor platelets by anticoagulation protocol: Yes   Plan:  Heparin bolus 4000 units x1 Heparin gtt 850 units/hr Daily HL/CBC HL [place], PharmD, BCPS Clinical Pharmacist Pager: [phone] 02/27/2014 2:21 PM

## 2014-02-28 ENCOUNTER — Inpatient Hospital Stay (HOSPITAL_COMMUNITY): Payer: Medicare Other

## 2014-02-28 ENCOUNTER — Encounter (HOSPITAL_COMMUNITY): Payer: Self-pay | Admitting: Radiology

## 2014-02-28 DIAGNOSIS — I472 Ventricular tachycardia, unspecified: Secondary | ICD-10-CM

## 2014-02-28 DIAGNOSIS — I10 Essential (primary) hypertension: Secondary | ICD-10-CM

## 2014-02-28 DIAGNOSIS — I2699 Other pulmonary embolism without acute cor pulmonale: Secondary | ICD-10-CM

## 2014-02-28 DIAGNOSIS — I519 Heart disease, unspecified: Secondary | ICD-10-CM

## 2014-02-28 DIAGNOSIS — I4729 Other ventricular tachycardia: Secondary | ICD-10-CM

## 2014-02-28 HISTORY — DX: Ventricular tachycardia, unspecified: I47.20

## 2014-02-28 HISTORY — DX: Ventricular tachycardia: I47.2

## 2014-02-28 LAB — CBC
HCT: 38.2 % (ref 36.0–46.0)
HEMOGLOBIN: 12.8 g/dL (ref 12.0–15.0)
MCH: 29.5 pg (ref 26.0–34.0)
MCHC: 33.5 g/dL (ref 30.0–36.0)
MCV: 88 fL (ref 78.0–100.0)
PLATELETS: 160 10*3/uL (ref 150–400)
RBC: 4.34 MIL/uL (ref 3.87–5.11)
RDW: 14.3 % (ref 11.5–15.5)
WBC: 9 10*3/uL (ref 4.0–10.5)

## 2014-02-28 LAB — BASIC METABOLIC PANEL
BUN: 18 mg/dL (ref 6–23)
CHLORIDE: 106 meq/L (ref 96–112)
CO2: 24 mEq/L (ref 19–32)
Calcium: 8.6 mg/dL (ref 8.4–10.5)
Creatinine, Ser: 1 mg/dL (ref 0.50–1.10)
GFR calc non Af Amer: 54 mL/min — ABNORMAL LOW (ref >90)
GFR, EST AFRICAN AMERICAN: 62 mL/min — AB (ref >90)
Glucose, Bld: 109 mg/dL — ABNORMAL HIGH (ref 70–99)
POTASSIUM: 4.3 meq/L (ref 3.7–5.3)
Sodium: 142 mEq/L (ref 137–147)

## 2014-02-28 LAB — HEPARIN LEVEL (UNFRACTIONATED): Heparin Unfractionated: 0.26 IU/mL — ABNORMAL LOW (ref 0.30–0.70)

## 2014-02-28 MED ORDER — IOHEXOL 300 MG/ML  SOLN
100.0000 mL | Freq: Once | INTRAMUSCULAR | Status: AC | PRN
  Administered 2014-02-28: 50 mL via INTRAVENOUS

## 2014-02-28 MED ORDER — MIDAZOLAM HCL 2 MG/2ML IJ SOLN
INTRAMUSCULAR | Status: AC | PRN
  Administered 2014-02-28: 2 mg via INTRAVENOUS

## 2014-02-28 MED ORDER — FENTANYL CITRATE 0.05 MG/ML IJ SOLN
INTRAMUSCULAR | Status: AC
  Filled 2014-02-28: qty 2

## 2014-02-28 MED ORDER — MIDAZOLAM HCL 2 MG/2ML IJ SOLN
INTRAMUSCULAR | Status: AC
  Filled 2014-02-28: qty 4

## 2014-02-28 MED ORDER — FENTANYL CITRATE 0.05 MG/ML IJ SOLN
INTRAMUSCULAR | Status: AC | PRN
  Administered 2014-02-28: 50 ug via INTRAVENOUS

## 2014-02-28 NOTE — Consult Note (Signed)
Name: Alexa [name] MRN: [id number] DOB: 08-19-1936    ADMISSION DATE:  02/25/2014 CONSULTATION DATE:  02/28/14  REFERRING MD :  Inis Sizer  PRIMARY SERVICE:  [facility]   CHIEF COMPLAINT:  PE   BRIEF PATIENT DESCRIPTION: 78 yo female with extensive PMH including DVT, lung ca s/p RULectomy, mod pulm HTN, PAF, dCHF admitted 3/30 after a fall at home.  Had significant dehydration, hypotension, elevated troponin and small SAH.  Further w/u for hypoxia and dyspnea revealed acute PE with evidence of R heart strain.   SIGNIFICANT EVENTS / STUDIES:  CT head 3/30>>> small SAH posterior L 2D echo 3/31>>> EF 55-60%, severely dilated RV and massively dilated RA, PA peak press 32mmHg BLE venous dopplers 3/31>>> LLE DVT  CTA chest 4/2>>>Positive for multiple acute PE with CTevidence of right heart strain consistent with at least submassive PE  LINES / TUBES: none  CULTURES: Urine 3/30>>>neg  BCx2 3/30>>>  ANTIBIOTICS: none  HISTORY OF PRESENT ILLNESS:  78 yo female with extensive PMH including DVT, lung ca s/p RULectomy, mod pulm HTN, PAF, dCHF admitted 3/30 after a fall at home.  Had significant dehydration, hypotension, elevated troponin and small SAH.  Further w/u for hypoxia and dyspnea revealed acute PE with evidence of R heart strain.  Per notes has had increasing confusion, dizziness, weakness over last few weeks prior to admit.  Was significantly hypotensive in ER with SBP 50's, quickly improved with fluids. Currently denies chest pain, SOB, dizziness.  Does c/o mild headache.  Denies blurry vision.    PAST MEDICAL HISTORY :  Past Medical History  Diagnosis Date  . Hyperlipidemia     a. Myalgias with multiple statins.  . Hypertension   . Adenocarcinoma of breast     a. s/p right simple and left modified radical mastectomy with chemotherapy and radiation in 2002.   Marland Kitchen GERD (gastroesophageal reflux disease)   . Diverticulosis   . Hx of adenomatous colonic polyps   . DVT (deep venous  thrombosis) 09-30-11    a. Right lower leg 09/2011.  . Arthritis   . Hypothyroidism   . Dementia     a. sees Dr. Brett Fairy. b. on Aricept.  Marland Kitchen History of kidney stones   . DDD (degenerative disc disease)     a. cervical and lumbar herniation and DJD s/p multiple diskectomies and other back surgeries from 2002-2008   . Asthma   . H/O vein stripping   . History of tobacco abuse     a. quit 25+ years ago. PFTs in 2/15 were relatively normal.   . Chronic diastolic CHF (congestive heart failure)     a. ETT-myoview (9/12) with 4' exercise, EF 68%, no ischemia or infarction. b. Echo (10/14) with EF 55-60%, mod diast dysfunction, normal RV size/fcn, mod pulm HTN.   . Venous insufficiency     a. H/o venous stasis ulceration.   Marland Kitchen PAF (paroxysmal atrial fibrillation)     a. Only episode that noted was in setting of right upper lobectomy in 2014. She was briefly on amiodarone but stopped it on her own. She is not coumadin. If recurrent AF, will need to consider.  . Non-small cell lung cancer     a. s/p right upper lobectomy in 2014.   . Pulmonary HTN     a. Moderate by echo 08/2013.  Marland Kitchen Cerebral infarct     a.  Remote anterior left frontal lobe infarct by CT head 01/2014.   Past Surgical History  Procedure Laterality  Date  . Cholecystectomy  2007  . Breast surgery  2002    BL mastectomy  . Lumbar laminectomy      x3  . Rotator cuff repair Right 10  . Cervical spine surgery    . Hip surgery    . Spine surgery    . Abdominal hysterectomy    . Fiberoptic bronchoscopy with electromagnetic  10/07/2010  . Video bronchoscopy N/A 05/14/2013    Procedure: VIDEO BRONCHOSCOPY;  Surgeon: Grace Isaac, MD;  Location: [facility] OR;  Service: Thoracic;  Laterality: N/A;  . Video assisted thoracoscopy (vats)/ lobectomy Right 05/14/2013    Procedure: VIDEO ASSISTED THORACOSCOPY (VATS)/ LOBECTOMY;  Surgeon: Grace Isaac, MD;  Location: [facility];  Service: Thoracic;  Laterality: Right;  . Colonoscopy  02-18-09      per Dr. Fuller Plan, adenomatous polyp, repeat in 5 yrs   . Neck-acdf    . Varicose vein surgery    . Hemorrhoid surgery  1985  . Kidney stone surgery  1987   Prior to Admission medications   Medication Sig Start Date End Date Taking? Authorizing Provider  aspirin EC 81 MG tablet Take 1 tablet (81 mg total) by mouth daily. 11/12/13  Yes Burtis Junes, NP  donepezil (ARICEPT) 10 MG tablet Take 1 tablet (10 mg total) by mouth at bedtime. 01/09/14  Yes Carmen Dohmeier, MD  esomeprazole ([place]) 40 MG packet Take 40 mg by mouth daily before breakfast. 09/27/13  Yes Burtis Junes, NP  furosemide (LASIX) 40 MG tablet 1 tablet (40mg ) AM and 1/2 tablet (20mg ) PM 02/13/14  Yes Larey Dresser, MD  levothyroxine (SYNTHROID, LEVOTHROID) 137 MCG tablet Take 137 mcg by mouth daily before breakfast.   Yes Historical Provider, MD  morphine (MSIR) 30 MG tablet Take 30 mg by mouth 3 (three) times daily as needed for pain.    Yes Historical Provider, MD  oxyCODONE-acetaminophen (PERCOCET) 10-325 MG per tablet Take 1 tablet by mouth every 4 (four) hours as needed (pain).    Yes Historical Provider, MD  potassium chloride SA (K-DUR,KLOR-CON) 20 MEQ tablet Take 20 mEq by mouth daily. 02/13/14  Yes Larey Dresser, MD  tobramycin-dexamethasone [facility]) ophthalmic ointment Place into both eyes daily. As needed 09/27/13  Yes Burtis Junes, NP  valsartan (DIOVAN) 320 MG tablet Take 1 tablet (320 mg total) by mouth daily. 09/26/13  Yes Burtis Junes, NP   Allergies  Allergen Reactions  . Strawberry Swelling    Swelling ,rash  . Hydrocodone-Homatropine     REACTION: Nausea  . Nsaids   . Penicillins     REACTION: rash  . Simvastatin     REACTION: muscle pain  . Statins     Myalgias with multiple statins     FAMILY HISTORY:  Family History  Problem Relation Age of Onset  . Diabetes Daughter   . Diabetes Daughter   . Heart disease Father   . Heart disease Brother   . Heart disease Sister   . Colon  cancer Neg Hx   . Colon polyps Daughter   . Stroke Mother    SOCIAL HISTORY:  reports that she quit smoking about 33 years ago. Her smoking use included Cigarettes. She has a 25 pack-year smoking history. She has never used smokeless tobacco. She reports that she does not drink alcohol or use illicit drugs.  REVIEW OF SYSTEMS:   As per HPI - All other systems reviewed and were neg.    VITAL SIGNS: Temp:  [  97.7 F (36.5 C)-98.6 F (37 C)] 97.9 F (36.6 C) (04/02 0753) Pulse Rate:  [73-90] 75 (04/02 0753) Resp:  [18-30] 24 (04/02 0753) BP: (105-160)/(62-92) 125/76 mmHg (04/02 0753) SpO2:  [90 %-98 %] 95 % (04/02 0753)  PHYSICAL EXAMINATION: General:  Chronically ill appearing female, NAD  Neuro:  Awake, alert, appropriate, mildly confused intermittently (baseline per daughter), oriented x 2  HEENT:  Mm moist, no JVD Cardiovascular:  s1s2 rrr Lungs:  resps even non labored on [place], diminished bases, otherwise cta  Abdomen:  Soft, +bs Musculoskeletal:  Warm and dry, 1+ BLE symmetric edema   Recent Labs Lab 02/26/14 0624 02/27/14 0521 02/28/14 0910  NA 143 141 142  K 4.4 5.5* 4.3  CL 108 105 106  CO2 21 20 24   BUN 33* 27* 18  CREATININE 1.23* 1.05 1.00  GLUCOSE 113* 125* 109*    Recent Labs Lab 02/26/14 0624 02/27/14 0521 02/28/14 0910  HGB 12.3 13.8 12.8  HCT 37.7 41.3 38.2  WBC 6.5 9.1 9.0  PLT 160 172 160   Ct Head Wo Contrast  02/27/2014   CLINICAL DATA:  Follow-up subarachnoid hemorrhage  EXAM: CT HEAD WITHOUT CONTRAST  TECHNIQUE: Contiguous axial images were obtained from the base of the skull through the vertex without intravenous contrast.  COMPARISON:  CT head 02/25/2014  FINDINGS: Interval improvement in small amount of subarachnoid hemorrhage in the left parietal region. This is most likely a posttraumatic hemorrhage given its location in size. No subdural hemorrhage.  Mild atrophy. 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 No acute ischemic  infarct. No mass or edema or midline shift. Negative for skull fracture.  IMPRESSION: Small amount of subarachnoid hemorrhage posteriorly on the left shows interval improvement. No new area of hemorrhage or infarction.   Electronically Signed   By: Franchot Gallo M.D.   On: 02/27/2014 09:09   Ct Angio Chest Pe W/cm &/or Wo Cm  02/27/2014   CLINICAL DATA:  Shortness of breath.  EXAM: CT ANGIOGRAPHY CHEST WITH CONTRAST  TECHNIQUE: Multidetector CT imaging of the chest was performed using the standard protocol during bolus administration of intravenous contrast. Multiplanar CT image reconstructions and MIPs were obtained to evaluate the vascular anatomy.  CONTRAST:  170mL OMNIPAQUE IOHEXOL 350 MG/ML SOLN  COMPARISON:  DG CHEST 1 VIEW dated 02/25/2014; CT CHEST W/O CM dated 04/05/2013  FINDINGS: The thoracic inlet is unremarkable.  Filling defects are appreciated within the right upper and lower lobe pulmonary arteries, right lower lobe segmental branches, left upper lobe, and segmental branches of left lower lobe.  RV LV ratio 1.96 and bowing of the interventricular septum to the left.  There is no evidence of a thoracic aortic aneurysm. Atherosclerotic calcifications are appreciated within the thoracic aorta.  Cardiomegaly is appreciated. Peritracheal and subcarinal adenopathy is demonstrated.  Diffuse ill-defined pulmonary opacities are demonstrated within the posterior base left upper lobe, superior segment left lower lobe, superior segment and base right lower lobe.  A small right pleural effusion is appreciated.  There are areas of subpleural and interlobular septal thickening in the lung bases.  The visualized upper abdominal viscera are grossly unremarkable.  Review of the MIP images confirms the above findings.  IMPRESSION: Positive for acute PE with CTevidence of right heart strain (RV/LV Ratio = 1.95) consistent with at least submassive (intermediate risk) PE. The presence of right heart strain has been  associated with an increased risk of morbidity and mortality.  Critical Value/emergent results were called  by telephone at the time of interpretation on 02/27/2014 at 9:19 PM to the patient's nurse Vedia Coffer, who verbally acknowledged these results AND WILL RELAY TO THE PATIENT'S ATTENDING PHYSICIAN.  Diffuse bilateral pulmonary infiltrates differential considerations infectious pneumonitis versus regions of infarction. Considering the findings on the comparison study more ominous etiologies cannot be excluded.  Small right pleural effusion   Electronically Signed   By: Margaree Mackintosh M.D.   On: 02/27/2014 21:23    ASSESSMENT / PLAN:  Acute submassive PE - with R heart strain. Previous hx DVT.  Likely explains elevated troponins, EKG changes as well.   REC -  -would d/c heparin  -IVC filter order placed -No thrombolytics  -Supplemental O2 as needed    R heart strain/ elevated troponins - hemodynamically stable.  Obvious contraindication to thrombolytics [facility]).  REC -  -F/u echo  -Cards following  -KVO IV fluids  -?need to continue nitro gtt   SAH - small.  REC -  -Monitor mental status  -Currently c/o headache - ?r/t nitro gtt - Consider f/u CT if continues  AKI - improved with volume  REC -  -F/u Sofie Hartigan, NP 02/28/2014  11:10 AM Pager: [phone] or [phone]  *Care during the described time interval was provided by me and/or other providers on the critical care team. I have reviewed this patient's available data, including medical history, events of note, physical examination and test results as part of my evaluation.   Attending:  I have seen and examined the patient with nurse practitioner/resident and agree with the note above.   This is a large recurrent PE and she still has clot in her leg.  Considering the ICH and need for lifelong anticoagulation, the better option here is an IVC filter.  Updated daughter by phone.  IVC filter order  placed.  Jillyn Hidden PCCM Pager: [phone] Cell: [phone] If no response, call [phone]

## 2014-02-28 NOTE — Progress Notes (Signed)
PT Cancellation Note  Patient Details Name: Alexa [name] MRN: [id number] DOB: 08/05/1936   Cancelled Treatment:    Reason Eval/Treat Not Completed: Medical issues which prohibited therapy .  Pt with acute PE.  Will check back on Friday and continue as tolerated.  MD: please advise if PT can resume Friday.  Thanks.   INGOLD,Daylyn Christine 02/28/2014, 10:19 AM [facility] [phone] (pager)

## 2014-02-28 NOTE — Progress Notes (Signed)
Dr. Radford Pax made aware that troponins slightly elevated from earlier set. Also notified of CT of Chest results of + PE's RLL,RUL, LLL, LUL and R heart strain per Dr. Adonis Housekeeper radiology. Pt on heparin gtt at 1000units/hr.

## 2014-02-28 NOTE — H&P (Signed)
Agree.  Clinical indication for IVC filter.  Will proceed with placement today.

## 2014-02-28 NOTE — Progress Notes (Signed)
Progress Note  Alexa [name] SWN:[id number] DOB: 05-05-1936 DOA: 02/25/2014 PCP: Laurey Morale, MD  Admit HPI / Brief Narrative: 78 year old female who has a past medical history of Hyperlipidemia; Hypertension; Cancer; GERD; Diverticulosis; adenomatous colonic polyps; DVT (09-30-11); Arthritis; Hypothyroidism; Alz Dementia; Peripheral vascular disease; History of kidney stones; DDD; Asthma; and H/O vein stripping who presented to the ED with a chief complaint of fall in the bathroom. As per patient she went to the bathroom and felt dizzy and was unsteady on her feet and fell. She denied passing out, and family heard the fall they brought her to the hospital. As per family patient had not been doing well over the past 2 weeks, and had been more confused and unsteady on her feet. She had reportedly been eating well.  She was seen by Cardiology on 3/18, and her dose of Lasix was increased to 40 mg in the morning and 20 mg in the evening for worsening dyspnea. Her last creatinine on 3/19 who was 1.0. At the time of her admit her creatinine was found to be 1.90, w/ BUN 46. Patient was found to be hypothermic and hypotensive with rectal temperature of 95.6, mild elevation of troponin to 0.12. UA was clear, chest x-ray showed no infiltrate. WBC 11.6. She denied nausea, diarrhea. She denied any chest pain or shortness of breath. She had one episode of vomiting in the ambulance. She was admitted for further evaluation and management.  HPI/Subjective: States she feels better today, no further chest pain. Denies shortness of breath. Complaining of a headache.  Assessment/Plan:  Acute PE-submassive, with right heart strain -Patient reports no further chest pain this a.m. -I have consulted pulmonology for further recommendations and given that she has a PE with DVT in the setting of subarachnoid hemorrhage -She is currently on heparin started last p.m. per cardiology for possible non-STEMI  NSTEMI with  continued CP -Per cardiology-was initially thought to be a demand ischemia, and on followup today patient with chest pain persistent since last PM -Repeat EKG done and per cards abnormal anterior T waves noted and theST changes more marked than in the past -Started on heparin and nitroglycerin drip per cards and transferred to step down on 4/1 -On 4/1 Consulted and discussed patient with neurosurgery/Dr. Arnoldo Morale in the light of her small traumatic subarachnoid bleed sustained prior to this admission and he recommends for treating MDs to weigh the risks versus benefits and that under the circumstances however, if he were the patient, he would accept the small risk of worsening intracranial hemorrhage with heparin anticoagulated for the benefit of limiting cardiac damage with an acute coronary syndrome. -On followup today cardiology attributes her troponin and EKG changes to put the pulmonary emboli, patient is still on nitroglycerin at this time>> cardiology to follow and advise on DC as appropriate  Hypovolemic shock - Dehydration - Hypotension SBP 50 mm Hg at presentation - no clinical evidence to suggest sepsis - BP has normalized w/ simple volume resuscitation - possible simple over diuresis in setting of preload dependent Pulm HTN?  -BP remained stable off IV fluids - Elevated d-dimer/acute to subacute left lower extremity DVT Large PE could explain many of presenting sx - B legs are enlarged, but no unilateral edema noted -Doppler ultrasound of lower extremities office 3/31 with acute to subacute DVT involving the left lower extremity - Patient now with chest pain and d-dimer markedly elevated  On 3/31 -As above CT scan with our submassive PE with right reticular  strain, I have consulted pulmonology for further recommendations. Patient will likely need an IVC filter.  Hypothermia likely due to profound dehydration - resolved - no evidence of active infection at this time   Acute kidney  injury  baseline Cr 1-1.2 - crt improved w/ fluids -Creatinine normalized to 1.05 on 4/1, remains within normal limits at 1 today.   Mild transaminitis likely very mild case of "shock liver" - recheck in AM  ALT trending down, no significant change in the AST  Chronic diastolic CHF -Compensated  Moderate pulm HTN  Parox Afib Rate currently well controlled - apparently was primarily only an issue during recent lung surgery   Small subarachnoid bleed -Followup CT scan today shows interval improvement, no new area of hemorrhage or infarction -Please see as discussed above with neurosurgery consulted today/ Dr. Arnoldo Morale -Continue to monitor her closely.  Hx [facility] s/p RUL lobectomy 2014  HLD  GERD -Place on PPI   Hx of R LE DVT 2012 See discussion above   Hypothyroidism Cont synthroid - check TSH  Alz Dementia  Hx of PVD  Code Status: FULL Family Communication:  daughter is present at bedside  Disposition Plan:  transferred back to step down   Consultants: Cardiology NS - via phone only   Procedures: none  Antibiotics: Aztreonam 3/30 Levaquin 3/30 Vanc 3/30  DVT prophylaxis: SCDs  Objective: Blood pressure 125/76, pulse 75, temperature 97.9 F (36.6 C), temperature source Oral, resp. rate 24, height 5\' 3"  (1.6 m), weight 86.955 kg (191 lb 11.2 oz), SpO2 95.00%.  Intake/Output Summary (Last 24 hours) at 02/28/14 1010 Last data filed at 02/28/14 0600  Gross per 24 hour  Intake 380.88 ml  Output    575 ml  Net -194.12 ml   Exam: General: No acute respiratory distress - alert - conversant  Lungs: Clear to auscultation bilaterally without wheezes or crackles Cardiovascular: Regular rate and rhythm without murmur gallop or rub  Abdomen: Nontender, nondistended, soft, bowel sounds positive, no rebound, no ascites, no appreciable mass Extremities: No significant cyanosis, clubbing, or edema bilateral lower extremities   Data Reviewed: Basic  Metabolic Panel:  Recent Labs Lab 02/25/14 1106 02/26/14 0624 02/27/14 0521 02/28/14 0910  NA 144 143 141 142  K 3.9 4.4 5.5* 4.3  CL 103 108 105 106  CO2 23 21 20 24   GLUCOSE 274* 113* 125* 109*  BUN 46* 33* 27* 18  CREATININE 1.90* 1.23* 1.05 1.00  CALCIUM 9.1 8.8 9.1 8.6   Liver Function Tests:  Recent Labs Lab 02/25/14 1106 02/26/14 0624 02/27/14 0521  AST 64* 53* 55*  ALT 34 43* 39*  ALKPHOS 100 96 97  BILITOT 1.1 1.0 1.0  PROT 7.3 6.3 7.2  ALBUMIN 3.7 3.0* 3.2*   CBC:  Recent Labs Lab 02/25/14 1106 02/26/14 0624 02/27/14 0521 02/28/14 0910  WBC 11.6* 6.5 9.1 9.0  NEUTROABS 7.5  --   --   --   HGB 13.6 12.3 13.8 12.8  HCT 41.6 37.7 41.3 38.2  MCV 90.6 89.5 88.6 88.0  PLT 177 160 172 160   Cardiac Enzymes:  Recent Labs Lab 02/25/14 2305 02/26/14 0624 02/27/14 1140 02/27/14 1640 02/27/14 2255  TROPONINI 1.77* 0.84* 0.76* 0.39* 0.57*   BNP (last 3 results)  Recent Labs  08/20/13 0912 02/13/14 1532  PROBNP 73.0 212.0*   CBG:  Recent Labs Lab 02/25/14 1439  GLUCAP 71    Recent Results (from the past 240 hour(s))  CULTURE, BLOOD (ROUTINE X  2)     Status: None   Collection Time    02/25/14 11:30 AM      Result Value Ref Range Status   Specimen Description BLOOD HAND RIGHT   Final   Special Requests BOTTLES DRAWN AEROBIC AND ANAEROBIC 4CC   Final   Culture  Setup Time     Final   Value: 02/25/2014 16:34     Performed at [facility]   Culture     Final   Value:        BLOOD CULTURE RECEIVED NO GROWTH TO DATE CULTURE WILL BE HELD FOR 5 DAYS BEFORE ISSUING A FINAL NEGATIVE REPORT     Performed at [facility]   Report Status PENDING   Incomplete  CULTURE, BLOOD (ROUTINE X 2)     Status: None   Collection Time    02/25/14 11:40 AM      Result Value Ref Range Status   Specimen Description BLOOD HAND LEFT   Final   Special Requests BOTTLES DRAWN AEROBIC ONLY 3CC   Final   Culture  Setup Time     Final   Value:  02/25/2014 16:34     Performed at [facility]   Culture     Final   Value:        BLOOD CULTURE RECEIVED NO GROWTH TO DATE CULTURE WILL BE HELD FOR 5 DAYS BEFORE ISSUING A FINAL NEGATIVE REPORT     Performed at [facility]   Report Status PENDING   Incomplete  URINE CULTURE     Status: None   Collection Time    02/25/14 12:00 PM      Result Value Ref Range Status   Specimen Description URINE, RANDOM   Final   Special Requests NONE   Final   Culture  Setup Time     Final   Value: 02/25/2014 17:41     Performed at [facility] Count     Final   Value: NO GROWTH     Performed at [facility]   Culture     Final   Value: NO GROWTH     Performed at [facility]   Report Status 02/26/2014 FINAL   Final  MRSA PCR SCREENING     Status: None   Collection Time    02/25/14  5:44 PM      Result Value Ref Range Status   MRSA by PCR NEGATIVE  NEGATIVE Final   Comment:            The GeneXpert MRSA Assay (FDA     approved for NASAL specimens     only), is one component of a     comprehensive MRSA colonization     surveillance program. It is not     intended to diagnose MRSA     infection nor to guide or     monitor treatment for     MRSA infections.     Studies:  Recent x-ray studies have been reviewed in detail by the Attending Physician  Scheduled Meds:  Scheduled Meds: . donepezil  10 mg Oral QHS  . feeding supplement (ENSURE COMPLETE)  237 mL Oral Q1500  . isosorbide mononitrate  30 mg Oral Daily  . levothyroxine  137 mcg Oral QAC breakfast  . pantoprazole  40 mg Oral Daily    Time spent on care of this patient: 35 mins   Sheila Oats [phone] [facility]  Office  [phone] Pager - Text Page per Shea Evans as per below:  On-Call/Text Page:      [email or web address]      password [email or web address]  If 7PM-7AM, please contact night-coverage [email or web address] Password [email or web address] 02/28/2014, 10:10 AM   LOS: 3 days  and the and 30

## 2014-02-28 NOTE — Progress Notes (Signed)
Patient ID: Alexa [name], female   DOB: Aug 17, 1936, 78 y.o.   MRN: [id number]    SUBJECTIVE:   The CT scan shows multiple pulmonary emboli. There is right ventricular dysfunction. I've spoken with the primary care team and they are aware of the diagnosis. They will be managing her pulmonary emboli. Her troponins and EKG changes are related to her pulmonary emboli.   Filed Vitals:   02/28/14 0400 02/28/14 0500 02/28/14 0600 02/28/14 0753  BP: 124/66 114/71 121/71 125/76  Pulse: 80 73 74 75  Temp: 97.7 F (36.5 C)   97.9 F (36.6 C)  TempSrc: Oral   Oral  Resp: 27 22 21 24   Height:      Weight:      SpO2: 93% 96% 94% 95%     Intake/Output Summary (Last 24 hours) at 02/28/14 0926 Last data filed at 02/28/14 0600  Gross per 24 hour  Intake 500.88 ml  Output    575 ml  Net -74.12 ml    LABS: Basic Metabolic Panel:  Recent Labs  02/26/14 0624 02/27/14 0521  NA 143 141  K 4.4 5.5*  CL 108 105  CO2 21 20  GLUCOSE 113* 125*  BUN 33* 27*  CREATININE 1.23* 1.05  CALCIUM 8.8 9.1   Liver Function Tests:  Recent Labs  02/26/14 0624 02/27/14 0521  AST 53* 55*  ALT 43* 39*  ALKPHOS 96 97  BILITOT 1.0 1.0  PROT 6.3 7.2  ALBUMIN 3.0* 3.2*   No results found for this basename: LIPASE, AMYLASE,  in the last 72 hours CBC:  Recent Labs  02/25/14 1106 02/26/14 0624 02/27/14 0521  WBC 11.6* 6.5 9.1  NEUTROABS 7.5  --   --   HGB 13.6 12.3 13.8  HCT 41.6 37.7 41.3  MCV 90.6 89.5 88.6  PLT 177 160 172   Cardiac Enzymes:  Recent Labs  02/27/14 1140 02/27/14 1640 02/27/14 2255  TROPONINI 0.76* 0.39* 0.57*   BNP: No components found with this basename: POCBNP,  D-Dimer:  Recent Labs  02/26/14 0918  DDIMER 11.85*   Hemoglobin A1C:  Recent Labs  02/27/14 0521  HGBA1C 5.7*   Fasting Lipid Panel: No results found for this basename: CHOL, HDL, LDLCALC, TRIG, CHOLHDL, LDLDIRECT,  in the last 72 hours Thyroid Function Tests: No results found for this  basename: TSH, T4TOTAL, FREET3, T3FREE, THYROIDAB,  in the last 72 hours  RADIOLOGY: Dg Chest 1 View  02/25/2014   CLINICAL DATA:  Hypotension, confusion  EXAM: CHEST - 1 VIEW  COMPARISON:  06/21/2013, 02/14/2014  FINDINGS: Postsurgical changes are noted in the thoracolumbar spine. Cardiac shadow is prominent but stable in appearance. Postsurgical changes are seen in the right hilum. No focal infiltrate is noted. No acute abnormality is seen.  IMPRESSION: No acute abnormality noted.   Electronically Signed   By: Inez Catalina M.D.   On: 02/25/2014 13:44   Ct Head Wo Contrast  02/27/2014   CLINICAL DATA:  Follow-up subarachnoid hemorrhage  EXAM: CT HEAD WITHOUT CONTRAST  TECHNIQUE: Contiguous axial images were obtained from the base of the skull through the vertex without intravenous contrast.  COMPARISON:  CT head 02/25/2014  FINDINGS: Interval improvement in small amount of subarachnoid hemorrhage in the left parietal region. This is most likely a posttraumatic hemorrhage given its location in size. No subdural hemorrhage.  Mild atrophy. 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 No acute ischemic infarct. No mass or edema or midline shift. Negative for  skull fracture.  IMPRESSION: Small amount of subarachnoid hemorrhage posteriorly on the left shows interval improvement. No new area of hemorrhage or infarction.   Electronically Signed   By: Franchot Gallo M.D.   On: 02/27/2014 09:09   Ct Head Wo Contrast  02/25/2014   CLINICAL DATA:  Weakness. Unconscious. Questionable trauma. Dementia.  EXAM: CT HEAD WITHOUT CONTRAST  CT CERVICAL SPINE WITHOUT CONTRAST  TECHNIQUE: Multidetector CT imaging of the head and cervical spine was performed following the standard protocol without intravenous contrast. Multiplanar CT image reconstructions of the cervical spine were also generated.  COMPARISON:  05/16/2008 head CT.  FINDINGS: CT HEAD FINDINGS  No skull fracture.  Suggestion of tiny amount of  subarachnoid blood versus tiny hemorrhagic contusion medial left parietal lobe (series 3, image 24 and 25).  Anterior left frontal lobe encephalomalacia suggestive of prior infarct.  No CT evidence of large acute infarct.  Global atrophy without hydrocephalus.  No intracranial mass lesion noted on this unenhanced exam.  CT CERVICAL SPINE FINDINGS  No cervical spine fracture.  Mild motion artifact C2 level.  No abnormal prevertebral soft tissue swelling.  Prior fusion C4-C7.  Cervical spondylotic changes most notable C3-4.  Post therapy type changes right upper lung.  IMPRESSION: CT head:  Suggestion of tiny amount of subarachnoid blood versus tiny hemorrhagic contusion medial left parietal lobe (series 3, image 24 and 25).  Remote anterior left frontal lobe infarct.  Atrophy.  CT cervical spine:  No cervical spine fracture.  Prior fusion C4-C7.  Cervical spondylotic changes most notable C3-4.  These results were called by telephone at the time of interpretation on 02/25/2014 at 1:33 PM to Dr. Riki Altes , who verbally acknowledged these results.   Electronically Signed   By: Chauncey Cruel M.D.   On: 02/25/2014 13:35   Ct Chest W Contrast  02/14/2014   CLINICAL DATA:  Lung cancer with partial right lung resection. Radiation therapy complete. Prior history of breast cancer.  EXAM: CT CHEST WITH CONTRAST  TECHNIQUE: Multidetector CT imaging of the chest was performed during intravenous contrast administration.  CONTRAST:  20mL OMNIPAQUE IOHEXOL 300 MG/ML  SOLN  COMPARISON:  DG CHEST 2 VIEW dated 06/21/2013; NM PET IMAGE INITIAL (PI) SKULL BASE TO THIGH dated 04/09/2013; CT CHEST W/O CM dated 04/05/2013  FINDINGS: No axillary or supraclavicular lymphadenopathy. Small paratracheal lymph nodes are not pathologic by size criteria. Right hilar lymph node measures 12 mm (image 23, series 2. Esophagus is normal.  Review of the lung windows demonstrate postsurgical change the right upper lobe. No nodularity. Left lung is clear.  Airways are normal centrally.  Limited view of the upper abdomen demonstrates normal adrenal glands.  IMPRESSION: 1. No evidence level lung cancer recurrence. 2. Postoperative change in the right hemi thorax.   Electronically Signed   By: Suzy Bouchard M.D.   On: 02/14/2014 14:30   Ct Angio Chest Pe W/cm &/or Wo Cm  02/27/2014   CLINICAL DATA:  Shortness of breath.  EXAM: CT ANGIOGRAPHY CHEST WITH CONTRAST  TECHNIQUE: Multidetector CT imaging of the chest was performed using the standard protocol during bolus administration of intravenous contrast. Multiplanar CT image reconstructions and MIPs were obtained to evaluate the vascular anatomy.  CONTRAST:  164mL OMNIPAQUE IOHEXOL 350 MG/ML SOLN  COMPARISON:  DG CHEST 1 VIEW dated 02/25/2014; CT CHEST W/O CM dated 04/05/2013  FINDINGS: The thoracic inlet is unremarkable.  Filling defects are appreciated within the right upper and lower lobe pulmonary arteries, right  lower lobe segmental branches, left upper lobe, and segmental branches of left lower lobe.  RV LV ratio 1.96 and bowing of the interventricular septum to the left.  There is no evidence of a thoracic aortic aneurysm. Atherosclerotic calcifications are appreciated within the thoracic aorta.  Cardiomegaly is appreciated. Peritracheal and subcarinal adenopathy is demonstrated.  Diffuse ill-defined pulmonary opacities are demonstrated within the posterior base left upper lobe, superior segment left lower lobe, superior segment and base right lower lobe.  A small right pleural effusion is appreciated.  There are areas of subpleural and interlobular septal thickening in the lung bases.  The visualized upper abdominal viscera are grossly unremarkable.  Review of the MIP images confirms the above findings.  IMPRESSION: Positive for acute PE with CTevidence of right heart strain (RV/LV Ratio = 1.95) consistent with at least submassive (intermediate risk) PE. The presence of right heart strain has been associated  with an increased risk of morbidity and mortality.  Critical Value/emergent results were called by telephone at the time of interpretation on 02/27/2014 at 9:19 PM to the patient's nurse Vedia Coffer, who verbally acknowledged these results AND WILL RELAY TO THE PATIENT'S ATTENDING PHYSICIAN.  Diffuse bilateral pulmonary infiltrates differential considerations infectious pneumonitis versus regions of infarction. Considering the findings on the comparison study more ominous etiologies cannot be excluded.  Small right pleural effusion   Electronically Signed   By: Margaree Mackintosh M.D.   On: 02/27/2014 21:23   Ct Cervical Spine Wo Contrast  02/25/2014   CLINICAL DATA:  Weakness. Unconscious. Questionable trauma. Dementia.  EXAM: CT HEAD WITHOUT CONTRAST  CT CERVICAL SPINE WITHOUT CONTRAST  TECHNIQUE: Multidetector CT imaging of the head and cervical spine was performed following the standard protocol without intravenous contrast. Multiplanar CT image reconstructions of the cervical spine were also generated.  COMPARISON:  05/16/2008 head CT.  FINDINGS: CT HEAD FINDINGS  No skull fracture.  Suggestion of tiny amount of subarachnoid blood versus tiny hemorrhagic contusion medial left parietal lobe (series 3, image 24 and 25).  Anterior left frontal lobe encephalomalacia suggestive of prior infarct.  No CT evidence of large acute infarct.  Global atrophy without hydrocephalus.  No intracranial mass lesion noted on this unenhanced exam.  CT CERVICAL SPINE FINDINGS  No cervical spine fracture.  Mild motion artifact C2 level.  No abnormal prevertebral soft tissue swelling.  Prior fusion C4-C7.  Cervical spondylotic changes most notable C3-4.  Post therapy type changes right upper lung.  IMPRESSION: CT head:  Suggestion of tiny amount of subarachnoid blood versus tiny hemorrhagic contusion medial left parietal lobe (series 3, image 24 and 25).  Remote anterior left frontal lobe infarct.  Atrophy.  CT cervical spine:  No  cervical spine fracture.  Prior fusion C4-C7.  Cervical spondylotic changes most notable C3-4.  These results were called by telephone at the time of interpretation on 02/25/2014 at 1:33 PM to Dr. Riki Altes , who verbally acknowledged these results.   Electronically Signed   By: Chauncey Cruel M.D.   On: 02/25/2014 13:35    PHYSICAL EXAM patient is feeling better. I spoke with her and her family in the room. Lungs reveal diffuse rhonchi. Cardiac exam reveals S1 and S2. The patient is oriented to person time and place. Affect is normal.   TELEMETRY: I personally reviewed telemetry today February 28, 2014. There is sinus rhythm. There is one 4 beat run of ventricular tachycardia.   ASSESSMENT AND PLAN:    Acute pulmonary embolism  Patient has had significant acute pulmonary emboli. She is on heparin. Her primary care team is aware and will be managing this aggressively.    Ventricular tachycardia      Monitor reveals 4 beats of ventricular tachycardia this morning. Plan to treat her electrolytes carefully. Plan to follow    Right ventricular dysfunction      There is significant right ventricular dysfunction. Presumably this is related to the pulmonary emboli. Followup echo in the future will be appropriate to reassess LV and RV function.    Subdural hematoma     The patient is on heparin. She has been cleared for this by neurosurgery. Hopefully this will not represent an issue.    Dola Argyle 02/28/2014 9:26 AM

## 2014-02-28 NOTE — H&P (Signed)
Referring Physician: PCCM HPI: Alexa [name] is an 78 y.o. female who presented to ED on 3/30 with weakness, hypotension post fall. She was admitted and worked up for sepsis and acute respiratory failure. She has had a history of DVT and venous duplex this admission revealed acute to subacute DVT involving peroneal vein. CTA of the chest revealed acute PE with right heart strain. CT head revealed small left posterior SAH. IR received request for image guided IVC filter. She denies any current chest pain, shortness of breath or palpitations. She denies any active signs of bleeding or excessive bruising. The patient denies any history of chronic oxygen use and is currently on 2L.   Past Medical History:  Past Medical History  Diagnosis Date  . Hyperlipidemia     a. Myalgias with multiple statins.  . Hypertension   . Adenocarcinoma of breast     a. s/p right simple and left modified radical mastectomy with chemotherapy and radiation in 2002.   Marland Kitchen GERD (gastroesophageal reflux disease)   . Diverticulosis   . Hx of adenomatous colonic polyps   . DVT (deep venous thrombosis) 09-30-11    a. Right lower leg 09/2011.  . Arthritis   . Hypothyroidism   . Dementia     a. sees Dr. Brett Fairy. b. on Aricept.  Marland Kitchen History of kidney stones   . DDD (degenerative disc disease)     a. cervical and lumbar herniation and DJD s/p multiple diskectomies and other back surgeries from 2002-2008   . Asthma   . H/O vein stripping   . History of tobacco abuse     a. quit 25+ years ago. PFTs in 2/15 were relatively normal.   . Chronic diastolic CHF (congestive heart failure)     a. ETT-myoview (9/12) with 4' exercise, EF 68%, no ischemia or infarction. b. Echo (10/14) with EF 55-60%, mod diast dysfunction, normal RV size/fcn, mod pulm HTN.   . Venous insufficiency     a. H/o venous stasis ulceration.   Marland Kitchen PAF (paroxysmal atrial fibrillation)     a. Only episode that noted was in setting of right upper lobectomy in  2014. She was briefly on amiodarone but stopped it on her own. She is not coumadin. If recurrent AF, will need to consider.  . Non-small cell lung cancer     a. s/p right upper lobectomy in 2014.   . Pulmonary HTN     a. Moderate by echo 08/2013.  Marland Kitchen Cerebral infarct     a.  Remote anterior left frontal lobe infarct by CT head 01/2014.    Past Surgical History:  Past Surgical History  Procedure Laterality Date  . Cholecystectomy  2007  . Breast surgery  2002    BL mastectomy  . Lumbar laminectomy      x3  . Rotator cuff repair Right 10  . Cervical spine surgery    . Hip surgery    . Spine surgery    . Abdominal hysterectomy    . Fiberoptic bronchoscopy with electromagnetic  10/07/2010  . Video bronchoscopy N/A 05/14/2013    Procedure: VIDEO BRONCHOSCOPY;  Surgeon: Grace Isaac, MD;  Location: [facility];  Service: Thoracic;  Laterality: N/A;  . Video assisted thoracoscopy (vats)/ lobectomy Right 05/14/2013    Procedure: VIDEO ASSISTED THORACOSCOPY (VATS)/ LOBECTOMY;  Surgeon: Grace Isaac, MD;  Location: [place];  Service: Thoracic;  Laterality: Right;  . Colonoscopy  02-18-09    per Dr. Fuller Plan, adenomatous polyp, repeat in  5 yrs   . Neck-acdf    . Varicose vein surgery    . Hemorrhoid surgery  1985  . Kidney stone surgery  1987    Family History:  Family History  Problem Relation Age of Onset  . Diabetes Daughter   . Diabetes Daughter   . Heart disease Father   . Heart disease Brother   . Heart disease Sister   . Colon cancer Neg Hx   . Colon polyps Daughter   . Stroke Mother     Social History:  reports that she quit smoking about 33 years ago. Her smoking use included Cigarettes. She has a 25 pack-year smoking history. She has never used smokeless tobacco. She reports that she does not drink alcohol or use illicit drugs.  Allergies:  Allergies  Allergen Reactions  . Strawberry Swelling    Swelling ,rash  . Hydrocodone-Homatropine     REACTION: Nausea  . Nsaids    . Penicillins     REACTION: rash  . Simvastatin     REACTION: muscle pain  . Statins     Myalgias with multiple statins     Medications:   Medication List    ASK your doctor about these medications       aspirin EC 81 MG tablet  Take 1 tablet (81 mg total) by mouth daily.     donepezil 10 MG tablet  Commonly known as:  ARICEPT  Take 1 tablet (10 mg total) by mouth at bedtime.     esomeprazole 40 MG packet  Commonly known as:  NEXIUM  Take 40 mg by mouth daily before breakfast.     furosemide 40 MG tablet  Commonly known as:  LASIX  1 tablet (20m) AM and 1/2 tablet (238m PM     levothyroxine 137 MCG tablet  Commonly known as:  SYNTHROID, LEVOTHROID  Take 137 mcg by mouth daily before breakfast.     morphine 30 MG tablet  Commonly known as:  MSIR  Take 30 mg by mouth 3 (three) times daily as needed for pain.     oxyCODONE-acetaminophen 10-325 MG per tablet  Commonly known as:  PERCOCET  Take 1 tablet by mouth every 4 (four) hours as needed (pain).     potassium chloride SA 20 MEQ tablet  Commonly known as:  K-DUR,KLOR-CON  Take 20 mEq by mouth daily.     tobramycin-dexamethasone ophthalmic ointment  Commonly known as:  TOBRADEX  Place into both eyes daily. As needed     valsartan 320 MG tablet  Commonly known as:  DIOVAN  Take 1 tablet (320 mg total) by mouth daily.       Please HPI for pertinent positives, otherwise complete 10 system ROS negative.  Physical Exam: BP 155/74  Pulse 77  Temp(Src) 98 F (36.7 C) (Oral)  Resp 28  Ht 5' 3" (1.6 m)  Wt 191 lb 11.2 oz (86.955 kg)  BMI 33.97 kg/m2  SpO2 96% Body mass index is 33.97 kg/(m^2).  General Appearance:  Alert, cooperative, no distress, mild confusion at times  Head:  Normocephalic, without obvious abnormality, atraumatic  Neck: Supple, symmetrical, trachea midline  Lungs:   Clear to auscultation bilaterally with diminished bases, no w/r/r, respirations unlabored without use of accessory  muscles.  Chest Wall:  No tenderness or deformity  Heart:  Regular rate and rhythm, S1, S2 normal, no murmur, rub or gallop.  Abdomen:   Soft, non-tender, non distended, (+) BS  Extremities: Extremities normal, atraumatic, no  cyanosis or edema  Pulses: 1+ and symmetric  Neurologic: Normal affect, no gross deficits.   Results for orders placed during the hospital encounter of 02/25/14 (from the past 48 hour(s))  BASIC METABOLIC PANEL     Status: Abnormal   Collection Time    02/27/14  5:21 AM      Result Value Ref Range   Sodium 141  137 - 147 mEq/L   Potassium 5.5 (*) 3.7 - 5.3 mEq/L   Comment: HEMOLYZED SPECIMEN, RESULTS MAY BE AFFECTED   Chloride 105  96 - 112 mEq/L   CO2 20  19 - 32 mEq/L   Glucose, Bld 125 (*) 70 - 99 mg/dL   BUN 27 (*) 6 - 23 mg/dL   Creatinine, Ser 1.05  0.50 - 1.10 mg/dL   Calcium 9.1  8.4 - 10.5 mg/dL   GFR calc non Af Amer 51 (*) >90 mL/min   GFR calc Af Amer 59 (*) >90 mL/min   Comment: (NOTE)     The eGFR has been calculated using the CKD EPI equation.     This calculation has not been validated in all clinical situations.     eGFR's persistently <90 mL/min signify possible Chronic Kidney     Disease.  CBC     Status: None   Collection Time    02/27/14  5:21 AM      Result Value Ref Range   WBC 9.1  4.0 - 10.5 K/uL   RBC 4.66  3.87 - 5.11 MIL/uL   Hemoglobin 13.8  12.0 - 15.0 g/dL   HCT 41.3  36.0 - 46.0 %   MCV 88.6  78.0 - 100.0 fL   MCH 29.6  26.0 - 34.0 pg   MCHC 33.4  30.0 - 36.0 g/dL   RDW 14.5  11.5 - 15.5 %   Platelets 172  150 - 400 K/uL  HEMOGLOBIN A1C     Status: Abnormal   Collection Time    02/27/14  5:21 AM      Result Value Ref Range   Hemoglobin A1C 5.7 (*) <5.7 %   Comment: (NOTE)                                                                               According to the ADA Clinical Practice Recommendations for 2011, when     HbA1c is used as a screening test:      >=6.5%   Diagnostic of Diabetes Mellitus                (if abnormal result is confirmed)     5.7-6.4%   Increased risk of developing Diabetes Mellitus     References:Diagnosis and Classification of Diabetes Mellitus,Diabetes     OMBT,5974,16(LAGTX 1):S62-S69 and Standards of Medical Care in             Diabetes - 2011,Diabetes Care,2011,34 (Suppl 1):S11-S61.   Mean Plasma Glucose 117 (*) <117 mg/dL   Comment: Performed at [facility]     Status: Abnormal   Collection Time    02/27/14  5:21 AM      Result Value Ref Range   Total Protein  7.2  6.0 - 8.3 g/dL   Albumin 3.2 (*) 3.5 - 5.2 g/dL   AST 55 (*) 0 - 37 U/L   Comment: HEMOLYSIS AT THIS LEVEL MAY AFFECT RESULT   ALT 39 (*) 0 - 35 U/L   Comment: HEMOLYSIS AT THIS LEVEL MAY AFFECT RESULT   Alkaline Phosphatase 97  39 - 117 U/L   Comment: HEMOLYSIS AT THIS LEVEL MAY AFFECT RESULT   Total Bilirubin 1.0  0.3 - 1.2 mg/dL   Bilirubin, Direct <0.2  0.0 - 0.3 mg/dL   Indirect Bilirubin NOT CALCULATED  0.3 - 0.9 mg/dL  TROPONIN I     Status: Abnormal   Collection Time    02/27/14 11:40 AM      Result Value Ref Range   Troponin I 0.76 (*) <0.30 ng/mL   Comment:            Due to the release kinetics of cTnI,     a negative result within the first hours     of the onset of symptoms does not rule out     myocardial infarction with certainty.     If myocardial infarction is still suspected,     repeat the test at appropriate intervals.     CRITICAL VALUE NOTED.  VALUE IS CONSISTENT WITH PREVIOUSLY REPORTED AND CALLED VALUE.  TROPONIN I     Status: Abnormal   Collection Time    02/27/14  4:40 PM      Result Value Ref Range   Troponin I 0.39 (*) <0.30 ng/mL   Comment:            Due to the release kinetics of cTnI,     a negative result within the first hours     of the onset of symptoms does not rule out     myocardial infarction with certainty.     If myocardial infarction is still suspected,     repeat the test at appropriate intervals.     CRITICAL  VALUE NOTED.  VALUE IS CONSISTENT WITH PREVIOUSLY REPORTED AND CALLED VALUE.  TROPONIN I     Status: Abnormal   Collection Time    02/27/14 10:55 PM      Result Value Ref Range   Troponin I 0.57 (*) <0.30 ng/mL   Comment:            Due to the release kinetics of cTnI,     a negative result within the first hours     of the onset of symptoms does not rule out     myocardial infarction with certainty.     If myocardial infarction is still suspected,     repeat the test at appropriate intervals.     CRITICAL VALUE NOTED.  VALUE IS CONSISTENT WITH PREVIOUSLY REPORTED AND CALLED VALUE.  HEPARIN LEVEL (UNFRACTIONATED)     Status: Abnormal   Collection Time    02/27/14 10:55 PM      Result Value Ref Range   Heparin Unfractionated 0.20 (*) 0.30 - 0.70 IU/mL   Comment:            IF HEPARIN RESULTS ARE BELOW     EXPECTED VALUES, AND PATIENT     DOSAGE HAS BEEN CONFIRMED,     SUGGEST FOLLOW UP TESTING     OF ANTITHROMBIN III LEVELS.  CBC     Status: None   Collection Time    02/28/14  9:10 AM      Result Value  Ref Range   WBC 9.0  4.0 - 10.5 K/uL   RBC 4.34  3.87 - 5.11 MIL/uL   Hemoglobin 12.8  12.0 - 15.0 g/dL   HCT 38.2  36.0 - 46.0 %   MCV 88.0  78.0 - 100.0 fL   MCH 29.5  26.0 - 34.0 pg   MCHC 33.5  30.0 - 36.0 g/dL   RDW 14.3  11.5 - 15.5 %   Platelets 160  150 - 400 K/uL  HEPARIN LEVEL (UNFRACTIONATED)     Status: Abnormal   Collection Time    02/28/14  9:10 AM      Result Value Ref Range   Heparin Unfractionated 0.26 (*) 0.30 - 0.70 IU/mL   Comment:            IF HEPARIN RESULTS ARE BELOW     EXPECTED VALUES, AND PATIENT     DOSAGE HAS BEEN CONFIRMED,     SUGGEST FOLLOW UP TESTING     OF ANTITHROMBIN III LEVELS.  BASIC METABOLIC PANEL     Status: Abnormal   Collection Time    02/28/14  9:10 AM      Result Value Ref Range   Sodium 142  137 - 147 mEq/L   Potassium 4.3  3.7 - 5.3 mEq/L   Comment: DELTA CHECK NOTED   Chloride 106  96 - 112 mEq/L   CO2 24  19 - 32  mEq/L   Glucose, Bld 109 (*) 70 - 99 mg/dL   BUN 18  6 - 23 mg/dL   Creatinine, Ser 1.00  0.50 - 1.10 mg/dL   Calcium 8.6  8.4 - 10.5 mg/dL   GFR calc non Af Amer 54 (*) >90 mL/min   GFR calc Af Amer 62 (*) >90 mL/min   Comment: (NOTE)     The eGFR has been calculated using the CKD EPI equation.     This calculation has not been validated in all clinical situations.     eGFR's persistently <90 mL/min signify possible Chronic Kidney     Disease.   Ct Head Wo Contrast  02/27/2014   CLINICAL DATA:  Follow-up subarachnoid hemorrhage  EXAM: CT HEAD WITHOUT CONTRAST  TECHNIQUE: Contiguous axial images were obtained from the base of the skull through the vertex without intravenous contrast.  COMPARISON:  CT head 02/25/2014  FINDINGS: Interval improvement in small amount of subarachnoid hemorrhage in the left parietal region. This is most likely a posttraumatic hemorrhage given its location in size. No subdural hemorrhage.  Mild atrophy. 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 No acute ischemic infarct. No mass or edema or midline shift. Negative for skull fracture.  IMPRESSION: Small amount of subarachnoid hemorrhage posteriorly on the left shows interval improvement. No new area of hemorrhage or infarction.   Electronically Signed   By: Franchot Gallo M.D.   On: 02/27/2014 09:09   Ct Angio Chest Pe W/cm &/or Wo Cm  02/27/2014   CLINICAL DATA:  Shortness of breath.  EXAM: CT ANGIOGRAPHY CHEST WITH CONTRAST  TECHNIQUE: Multidetector CT imaging of the chest was performed using the standard protocol during bolus administration of intravenous contrast. Multiplanar CT image reconstructions and MIPs were obtained to evaluate the vascular anatomy.  CONTRAST:  149m OMNIPAQUE IOHEXOL 350 MG/ML SOLN  COMPARISON:  DG CHEST 1 VIEW dated 02/25/2014; CT CHEST W/O CM dated 04/05/2013  FINDINGS: The thoracic inlet is unremarkable.  Filling defects are appreciated within the right upper and lower lobe pulmonary  arteries, right lower lobe segmental branches, left upper lobe, and segmental branches of left lower lobe.  RV LV ratio 1.96 and bowing of the interventricular septum to the left.  There is no evidence of a thoracic aortic aneurysm. Atherosclerotic calcifications are appreciated within the thoracic aorta.  Cardiomegaly is appreciated. Peritracheal and subcarinal adenopathy is demonstrated.  Diffuse ill-defined pulmonary opacities are demonstrated within the posterior base left upper lobe, superior segment left lower lobe, superior segment and base right lower lobe.  A small right pleural effusion is appreciated.  There are areas of subpleural and interlobular septal thickening in the lung bases.  The visualized upper abdominal viscera are grossly unremarkable.  Review of the MIP images confirms the above findings.  IMPRESSION: Positive for acute PE with CTevidence of right heart strain (RV/LV Ratio = 1.95) consistent with at least submassive (intermediate risk) PE. The presence of right heart strain has been associated with an increased risk of morbidity and mortality.  Critical Value/emergent results were called by telephone at the time of interpretation on 02/27/2014 at 9:19 PM to the patient's nurse Vedia Coffer, who verbally acknowledged these results AND WILL RELAY TO THE PATIENT'S ATTENDING PHYSICIAN.  Diffuse bilateral pulmonary infiltrates differential considerations infectious pneumonitis versus regions of infarction. Considering the findings on the comparison study more ominous etiologies cannot be excluded.  Small right pleural effusion   Electronically Signed   By: Margaree Mackintosh M.D.   On: 02/27/2014 21:23    Assessment/Plan LLE DVT with history of previous DVT. Venous insufficiency. PAF. Dementia. History of lung cancer s/p RUL lobectomy.  Acute pulmonary embolism with right heart strain, on IV heparin  Small SAH  Request for image guided IVC filter placement. Patient has been NPO, labs  and images reviewed. Risks and Benefits discussed with the patient and her daughter. All of the patient's questions and daughter's questions were answered, patient is agreeable to proceed. Consent signed and in chart.   Tsosie Billing D PA-C 02/28/2014, 3:25 PM

## 2014-02-28 NOTE — Progress Notes (Signed)
ANTICOAGULATION CONSULT NOTE - Follow Up Consult  Pharmacy Consult for heparin Indication: pulmonary embolus, DVT and NSTEMI    Allergies  Allergen Reactions  . Strawberry Swelling    Swelling ,rash  . Hydrocodone-Homatropine     REACTION: Nausea  . Nsaids   . Penicillins     REACTION: rash  . Simvastatin     REACTION: muscle pain  . Statins     Myalgias with multiple statins     Patient Measurements: Height: 5\' 3"  (160 cm) Weight: 191 lb 11.2 oz (86.955 kg) IBW/kg (Calculated) : 52.4 Heparin Dosing Weight: 72 kg  Vital Signs: Temp: 97.9 F (36.6 C) (04/02 0753) Temp src: Oral (04/02 0753) BP: 125/76 mmHg (04/02 0753) Pulse Rate: 75 (04/02 0753)  Labs:  Recent Labs  02/26/14 0624 02/27/14 0521 02/27/14 1140 02/27/14 1640 02/27/14 2255 02/28/14 0910  HGB 12.3 13.8  --   --   --  12.8  HCT 37.7 41.3  --   --   --  38.2  PLT 160 172  --   --   --  160  HEPARINUNFRC  --   --   --   --  0.20* 0.26*  CREATININE 1.23* 1.05  --   --   --  1.00  TROPONINI 0.84*  --  0.76* 0.39* 0.57*  --     Estimated Creatinine Clearance: 50.8 ml/min (by C-G formula based on Cr of 1).  Assessment: Patient is a 78 y.o currently on heparin for new LLE DVT/PEs and r/o ACS.  With noted small subarachnoid hemorrhage, heparin goal has been lower to 0.3-0.5.  Heparin level now back slightly below goal at 0.26.  No new bleeding documented.  Goal of Therapy:  Heparin level 0.3-0.5 units/ml Monitor platelets by anticoagulation protocol: Yes   Plan:  1) increase heparin drip to 1100 units/hr 2) recheck another 8 hour heparin level to assess new regimen 3) watch for s/s bleeding  Deysy Schabel P 02/28/2014,10:39 AM

## 2014-02-28 NOTE — Procedures (Signed)
Procedure:  IVC filter placement Access:  Right IJ vein Findings:  IVC normally patent Bard Denali IVC filter placed in infrarenal IVC.

## 2014-03-01 ENCOUNTER — Inpatient Hospital Stay (HOSPITAL_COMMUNITY): Payer: Medicare Other

## 2014-03-01 DIAGNOSIS — I959 Hypotension, unspecified: Secondary | ICD-10-CM

## 2014-03-01 DIAGNOSIS — G9340 Encephalopathy, unspecified: Secondary | ICD-10-CM

## 2014-03-01 LAB — CBC
HCT: 34.9 % — ABNORMAL LOW (ref 36.0–46.0)
Hemoglobin: 11.6 g/dL — ABNORMAL LOW (ref 12.0–15.0)
MCH: 29.2 pg (ref 26.0–34.0)
MCHC: 33.2 g/dL (ref 30.0–36.0)
MCV: 87.9 fL (ref 78.0–100.0)
PLATELETS: 159 10*3/uL (ref 150–400)
RBC: 3.97 MIL/uL (ref 3.87–5.11)
RDW: 14.4 % (ref 11.5–15.5)
WBC: 7.2 10*3/uL (ref 4.0–10.5)

## 2014-03-01 LAB — ANTITHROMBIN III: AntiThromb III Func: 88 % (ref 75–120)

## 2014-03-01 MED ORDER — KETOROLAC TROMETHAMINE 30 MG/ML IJ SOLN
30.0000 mg | Freq: Once | INTRAMUSCULAR | Status: AC
  Administered 2014-03-01: 30 mg via INTRAVENOUS
  Filled 2014-03-01: qty 1

## 2014-03-01 NOTE — Progress Notes (Signed)
Cont c/o headache; teary eyed. B/p 118/78; heart rate  82. Dr. Dillard Essex notified with orders to give Toradol 30 mg IV

## 2014-03-01 NOTE — Progress Notes (Signed)
BRIEF PATIENT DESCRIPTION: 78 yo female with extensive PMH including DVT, lung ca s/p RULectomy, mod pulm HTN, PAF, dCHF admitted 3/30 after a fall at home. Had significant dehydration, hypotension, elevated troponin and small SAH. Further w/u for hypoxia and dyspnea revealed acute PE with evidence of R heart strain.   SIGNIFICANT EVENTS / STUDIES:  CT head 3/30>>> small SAH posterior L  2D echo 3/31>>> EF 55-60%, severely dilated RV and massively dilated RA, PA peak press 21mmHg  BLE venous dopplers 3/31>>> LLE DVT  CTA chest 4/2>>>Positive for multiple acute PE with CTevidence of right heart strain consistent with at least submassive PE    Subjective: Pt doing well, no increased wob.  Excellent sats.  Had IVC filter placed last night.  Objective: Vital signs in last 24 hours: Blood pressure 160/72, pulse 75, temperature 97.7 F (36.5 C), temperature source Oral, resp. rate 20, height 5\' 3"  (1.6 m), weight 86.955 kg (191 lb 11.2 oz), SpO2 99.00%.  Intake/Output from previous day: 04/02 0701 - 04/03 0700 In: 0  Out: 650 [Urine:650]   Physical Exam:   wd female, in nad Nose without purulence or d/c noted Neck without LN or TMG Chest clear, no wheezing Cor with rrr LE with minimal edema, no cyanosis Alert and oriented, moves all 4.      Assessment/Plan:  Acute submassive PE - with R heart strain. Previous hx DVT. Likely explains elevated troponins, EKG changes as well.  REC -  -keep off anticoagulation -Supplemental O2 as needed    R heart strain/ elevated troponins - hemodynamically stable. Obvious contraindication to thrombolytics [facility]).   SAH     Pt stable overnight with excellent oxygenation and no increased wob .  IVC filter now in place.  Will see again on Monday, but call if any issues over the weekend.  If she is discharged, she is usually followed by Dr. Lamonte Sakai in the office.   Kathee Delton, M.D. 03/01/2014, 12:15 PM

## 2014-03-01 NOTE — Progress Notes (Signed)
PT Cancellation Note  Patient Details Name: Alexa [name] MRN: [id number] DOB: [birth year]   Cancelled Treatment:    Reason Eval/Treat Not Completed: Other (comment) (pt just received breakfast and declined mobility will attempt in pm)   Lanetta Inch Beth 03/01/2014, 8:56 AM [name], [place]

## 2014-03-01 NOTE — Progress Notes (Signed)
Patient ID: Alexa [name], female   DOB: 23-May-1936, 78 y.o.   MRN: [id number]  I called and gave a verbal  order to discontinue IV nitroglycerin. I have also stopped oral Imdur.   Cardiology will start off.  Daryel November, MD

## 2014-03-01 NOTE — Progress Notes (Signed)
No c/o voiced. Family at bedside for supper.

## 2014-03-01 NOTE — Progress Notes (Addendum)
Progress Note  ETHLYN ALTO EVO:[id number] DOB: February 04, 1936 DOA: 02/25/2014 PCP: Laurey Morale, MD  Admit HPI / Brief Narrative: 78 year old female who has a past medical history of Hyperlipidemia; Hypertension; Cancer; GERD; Diverticulosis; adenomatous colonic polyps; DVT (09-30-11); Arthritis; Hypothyroidism; Alz Dementia; Peripheral vascular disease; History of kidney stones; DDD; Asthma; and H/O vein stripping who presented to the ED with a chief complaint of fall in the bathroom. As per patient she went to the bathroom and felt dizzy and was unsteady on her feet and fell. She denied passing out, and family heard the fall they brought her to the hospital. As per family patient had not been doing well over the past 2 weeks, and had been more confused and unsteady on her feet. She had reportedly been eating well.  She was seen by Cardiology on 3/18, and her dose of Lasix was increased to 40 mg in the morning and 20 mg in the evening for worsening dyspnea. Her last creatinine on 3/19 who was 1.0. At the time of her admit her creatinine was found to be 1.90, w/ BUN 46. Patient was found to be hypothermic and hypotensive with rectal temperature of 95.6, mild elevation of troponin to 0.12. UA was clear, chest x-ray showed no infiltrate. WBC 11.6. She denied nausea, diarrhea. She denied any chest pain or shortness of breath. She had one episode of vomiting in the ambulance. She was admitted for further evaluation and management.  HPI/Subjective: States she feels better today, no further chest pain. Denies shortness of breath. Complaining of a headache.  Assessment/Plan:  Acute PE-submassive, with right heart strain -Patient reports no further chest pain this a.m. -I have consulted pulmonology for further recommendations and given that she has a PE with DVT in the setting of subarachnoid hemorrhage -heparin started pm of 4/1 per cardiology for possible non-STEMI, was dc'd 4/2 per ccm due to [facility] -Pt  had ivc filter placed 4/2, appreciate pulm assistance -will obtain hypercoagulable panel -CCM to follow for further recs  NSTEMI with continued CP -Per cardiology-was initially thought to be a demand ischemia, and on followup today patient with chest pain persistent since last PM -Repeat EKG done and per cards abnormal anterior T waves noted and theST changes more marked than in the past -Started on heparin and nitroglycerin drip per cards and transferred to step down on 4/1 -On 4/1 Consulted and discussed patient with neurosurgery/Dr. Arnoldo Morale in the light of her small traumatic subarachnoid bleed sustained prior to this admission and he recommends for treating MDs to weigh the risks versus benefits and that under the circumstances however, if he were the patient, he would accept the small risk of worsening intracranial hemorrhage with heparin anticoagulated for the benefit of limiting cardiac damage with an acute coronary syndrome. -On followup 4/2 cardiology attributed her troponin and EKG changes to the pulmonary emboli, patient was still on nitroglycerin >>dc'ed this am 4/3 per cardiology.  Hypovolemic shock - Dehydration - Hypotension SBP 50 mm Hg at presentation - no clinical evidence to suggest sepsis - BP has normalized w/ simple volume resuscitation - possible simple over diuresis in setting of preload dependent Pulm HTN?  -BP remains stable off IV fluids -large PE was likely contributing factor to hypotension  Elevated d-dimer/acute to subacute left lower extremity DVT Large PE could explain many of presenting sx - B legs are enlarged, but no unilateral edema noted -Doppler ultrasound of lower extremities office 3/31 with acute to subacute DVT involving the left lower  extremity - Patient now with chest pain and d-dimer markedly elevated  On 3/31 -As above CT scan with our submassive PE with right reticular strain, -as above s/p IVC filter 4/2, hypercoagulable panel ordered as  above.  Hypothermia likely due to profound dehydration - resolved - no evidence of active infection at this time   Acute kidney injury  baseline Cr 1-1.2 - crt improved w/ fluids -Creatinine normalized to 1.05 on 4/1, remains within normal limits at 1 today.   Mild transaminitis likely very mild case of "shock liver" - recheck in AM  ALT trending down, no significant change in the AST  Chronic diastolic CHF -Compensated  Moderate pulm HTN  Parox Afib Rate currently well controlled - apparently was primarily only an issue during recent lung surgery   Small subarachnoid bleed -Followup CT scan today shows interval improvement, no new area of hemorrhage or infarction -Please see as discussed above with neurosurgery consulted today/ Dr. Arnoldo Morale -Continue to monitor her closely.  Hx [facility] CA s/p RUL lobectomy 2014  HLD  GERD -Place on PPI   Hx of R LE DVT 2012 See discussion above   Hypothyroidism Cont synthroid - check TSH  Alz Dementia  Hx of PVD  Code Status: FULL Family Communication:  daughter is present at bedside  Disposition Plan:  transferred back to step down   Consultants: Cardiology NS - via phone only   Procedures: none  Antibiotics: Aztreonam 3/30 Levaquin 3/30 Vanc 3/30  DVT prophylaxis: SCDs  Objective: Blood pressure 160/72, pulse 72, temperature 97.5 F (36.4 C), temperature source Oral, resp. rate 11, height 5\' 3"  (1.6 m), weight 86.955 kg (191 lb 11.2 oz), SpO2 98.00%.  Intake/Output Summary (Last 24 hours) at 03/01/14 0905 Last data filed at 03/01/14 0814  Gross per 24 hour  Intake      0 ml  Output    950 ml  Net   -950 ml   Exam: General: No acute respiratory distress - alert - conversant  Lungs: Clear to auscultation bilaterally without wheezes or crackles Cardiovascular: Regular rate and rhythm without murmur gallop or rub  Abdomen: Nontender, nondistended, soft, bowel sounds positive, no rebound, no ascites, no  appreciable mass Extremities: No significant cyanosis, clubbing, or edema bilateral lower extremities   Data Reviewed: Basic Metabolic Panel:  Recent Labs Lab 02/25/14 1106 02/26/14 0624 02/27/14 0521 02/28/14 0910  NA 144 143 141 142  K 3.9 4.4 5.5* 4.3  CL 103 108 105 106  CO2 23 21 20 24   GLUCOSE 274* 113* 125* 109*  BUN 46* 33* 27* 18  CREATININE 1.90* 1.23* 1.05 1.00  CALCIUM 9.1 8.8 9.1 8.6   Liver Function Tests:  Recent Labs Lab 02/25/14 1106 02/26/14 0624 02/27/14 0521  AST 64* 53* 55*  ALT 34 43* 39*  ALKPHOS 100 96 97  BILITOT 1.1 1.0 1.0  PROT 7.3 6.3 7.2  ALBUMIN 3.7 3.0* 3.2*   CBC:  Recent Labs Lab 02/25/14 1106 02/26/14 0624 02/27/14 0521 02/28/14 0910 03/01/14 0245  WBC 11.6* 6.5 9.1 9.0 7.2  NEUTROABS 7.5  --   --   --   --   HGB 13.6 12.3 13.8 12.8 11.6*  HCT 41.6 37.7 41.3 38.2 34.9*  MCV 90.6 89.5 88.6 88.0 87.9  PLT 177 160 172 160 159   Cardiac Enzymes:  Recent Labs Lab 02/25/14 2305 02/26/14 0624 02/27/14 1140 02/27/14 1640 02/27/14 2255  TROPONINI 1.77* 0.84* 0.76* 0.39* 0.57*   BNP (last 3 results)  Recent Labs  08/20/13 0912 02/13/14 1532  PROBNP 73.0 212.0*   CBG:  Recent Labs Lab 02/25/14 1439  GLUCAP 71    Recent Results (from the past 240 hour(s))  CULTURE, BLOOD (ROUTINE X 2)     Status: None   Collection Time    02/25/14 11:30 AM      Result Value Ref Range Status   Specimen Description BLOOD HAND RIGHT   Final   Special Requests BOTTLES DRAWN AEROBIC AND ANAEROBIC 4CC   Final   Culture  Setup Time     Final   Value: 02/25/2014 16:34     Performed at [facility]   Culture     Final   Value:        BLOOD CULTURE RECEIVED NO GROWTH TO DATE CULTURE WILL BE HELD FOR 5 DAYS BEFORE ISSUING A FINAL NEGATIVE REPORT     Performed at [facility]   Report Status PENDING   Incomplete  CULTURE, BLOOD (ROUTINE X 2)     Status: None   Collection Time    02/25/14 11:40 AM      Result  Value Ref Range Status   Specimen Description BLOOD HAND LEFT   Final   Special Requests BOTTLES DRAWN AEROBIC ONLY 3CC   Final   Culture  Setup Time     Final   Value: 02/25/2014 16:34     Performed at [facility]   Culture     Final   Value:        BLOOD CULTURE RECEIVED NO GROWTH TO DATE CULTURE WILL BE HELD FOR 5 DAYS BEFORE ISSUING A FINAL NEGATIVE REPORT     Performed at [facility]   Report Status PENDING   Incomplete  URINE CULTURE     Status: None   Collection Time    02/25/14 12:00 PM      Result Value Ref Range Status   Specimen Description URINE, RANDOM   Final   Special Requests NONE   Final   Culture  Setup Time     Final   Value: 02/25/2014 17:41     Performed at [place]     Final   Value: NO GROWTH     Performed at [facility]   Culture     Final   Value: NO GROWTH     Performed at [facility]   Report Status 02/26/2014 FINAL   Final  MRSA PCR SCREENING     Status: None   Collection Time    02/25/14  5:44 PM      Result Value Ref Range Status   MRSA by PCR NEGATIVE  NEGATIVE Final   Comment:            The GeneXpert MRSA Assay (FDA     approved for NASAL specimens     only), is one component of a     comprehensive MRSA colonization     surveillance program. It is not     intended to diagnose MRSA     infection nor to guide or     monitor treatment for     MRSA infections.     Studies:  Recent x-ray studies have been reviewed in detail by the Attending Physician  Scheduled Meds:  Scheduled Meds: . donepezil  10 mg Oral QHS  . feeding supplement (ENSURE COMPLETE)  237 mL Oral Q1500  . levothyroxine  137 mcg Oral  QAC breakfast  . pantoprazole  40 mg Oral Daily    Time spent on care of this patient: 35 mins   [facility]  [phone] Pager - Text Page per Shea Evans as per below:  On-Call/Text Page:      [email or web address]      password  [email or web address]  If 7PM-7AM, please contact night-coverage [email or web address] Password [email or web address] 03/01/2014, 9:05 AM   LOS: 4 days

## 2014-03-01 NOTE — Progress Notes (Signed)
Noted s/s of confusion. Daughter at bedside  with confirmation. Noted having difficulty completing her sentences. No weakness noted  no vision changes; no facial drooping. Dr. Candis Shine notified and CT head ordered.

## 2014-03-01 NOTE — Progress Notes (Signed)
Physical Therapy Treatment Patient Details Name: Alexa [name] MRN: [id number] DOB: 03/05/1936 Today's Date: 03/01/2014    History of Present Illness Presents to the ED chief complaint of fall in the bathroom. Patient has history of Alzheimer's dementia but is able to provide good history, as per patient she went to the bathroom and felt dizzy and was unsteady on her feet and fell. She denies passing out, and family heard the fall they brought her to the hospital. As per family patient has not been doing well over the past 2 weeks, she has been more confused, unsteady on her feet. NSTEMI, SAH. Acute PE 02/27/14 with IVC filter 4/2. Elevated troponin related to PE and ok for mobility as no further draws or intervention    PT Comments    Pt progressing with activity today with desaturation to 86% periodically on 4L with gait with HR 98-105. Pt encouraged to continue mobility daily with staff as well as HEP. Will continue to follow.  Follow Up Recommendations  Home health PT;Supervision/Assistance - 24 hour     Equipment Recommendations  None recommended by PT    Recommendations for Other Services       Precautions / Restrictions Precautions Precautions: Fall Precaution Comments: watch sats    Mobility  Bed Mobility               General bed mobility comments: pt in recliner on arrival  Transfers Overall transfer level: Needs assistance     Sit to Stand: Min guard         General transfer comment: cued for safety/hand placement  Ambulation/Gait Ambulation/Gait assistance: Min guard Ambulation Distance (Feet): 350 Feet Assistive device: Rolling walker (2 wheeled) Gait Pattern/deviations: Step-through pattern;Decreased stride length   Gait velocity interpretation: Below normal speed for age/gender General Gait Details: pt started stepping away from recliner without AD with significant LOB with mod assist to recover balance. Pt preoccupied by line position. walked  15' without RW with poor balance and much improved with use of RW for rest ot gait with cues throughout to step into RW and extend trunk   Stairs Stairs: Yes Stairs assistance: Min guard Stair Management: One rail Right;Step to pattern;Forwards Number of Stairs: 2    Wheelchair Mobility    Modified Rankin (Stroke Patients Only)       Balance                                    Cognition Arousal/Alertness: Awake/alert Behavior During Therapy: WFL for tasks assessed/performed Overall Cognitive Status: History of cognitive impairments - at baseline                      Exercises General Exercises - Lower Extremity Long Arc Quad: AROM;Seated;Both;20 reps Hip Flexion/Marching: AROM;Seated;Both;20 reps Toe Raises: AROM;Seated;Both;20 reps    General Comments        Pertinent Vitals/Pain No pain HR 101 and sats 98% at rest on 2L    Home Living                      Prior Function            PT Goals (current goals can now be found in the care plan section) Progress towards PT goals: Progressing toward goals    Frequency  Min 3X/week    PT Plan Discharge plan needs to be updated;Frequency needs  to be updated    Co-evaluation             End of Session Equipment Utilized During Treatment: Oxygen Activity Tolerance: Patient tolerated treatment well Patient left: in chair;with call bell/phone within [name];with family/visitor present     Time: 4388-8757 PT Time Calculation (min): 28 min  Charges:  $Gait Training: 8-22 mins $Therapeutic Exercise: 8-22 mins                    G Codes:      Alexa [name] 2014/03/28, 2:20 PM Alexa [name], [place]

## 2014-03-02 DIAGNOSIS — I609 Nontraumatic subarachnoid hemorrhage, unspecified: Secondary | ICD-10-CM

## 2014-03-02 LAB — BASIC METABOLIC PANEL
BUN: 24 mg/dL — ABNORMAL HIGH (ref 6–23)
CO2: 22 mEq/L (ref 19–32)
Calcium: 8.4 mg/dL (ref 8.4–10.5)
Chloride: 106 mEq/L (ref 96–112)
Creatinine, Ser: 1.22 mg/dL — ABNORMAL HIGH (ref 0.50–1.10)
GFR, EST AFRICAN AMERICAN: 49 mL/min — AB (ref >90)
GFR, EST NON AFRICAN AMERICAN: 42 mL/min — AB (ref >90)
Glucose, Bld: 101 mg/dL — ABNORMAL HIGH (ref 70–99)
Potassium: 3.8 mEq/L (ref 3.7–5.3)
SODIUM: 143 meq/L (ref 137–147)

## 2014-03-02 LAB — CBC
HCT: 34.9 % — ABNORMAL LOW (ref 36.0–46.0)
Hemoglobin: 11.7 g/dL — ABNORMAL LOW (ref 12.0–15.0)
MCH: 29.3 pg (ref 26.0–34.0)
MCHC: 33.5 g/dL (ref 30.0–36.0)
MCV: 87.5 fL (ref 78.0–100.0)
Platelets: 175 10*3/uL (ref 150–400)
RBC: 3.99 MIL/uL (ref 3.87–5.11)
RDW: 14.1 % (ref 11.5–15.5)
WBC: 7.2 10*3/uL (ref 4.0–10.5)

## 2014-03-02 LAB — HOMOCYSTEINE: Homocysteine: 14.4 umol/L (ref 4.0–15.4)

## 2014-03-02 MED ORDER — RISPERIDONE 0.5 MG PO TBDP
0.5000 mg | ORAL_TABLET | Freq: Every day | ORAL | Status: DC | PRN
  Filled 2014-03-02: qty 1

## 2014-03-02 NOTE — Progress Notes (Signed)
Pt transferred to the unit at 1748. Pt mental status is Alert & Oriented. Pt oriented to room, staff, and call bell. Skin is intact. Call bell within reach.

## 2014-03-02 NOTE — Progress Notes (Signed)
Progress Note  Alexa [name] TKW:[id number] DOB: [birth year] DOA: 02/25/2014 PCP: Laurey Morale, MD  Admit HPI / Brief Narrative: 78 year old female who has a past medical history of Hyperlipidemia; Hypertension; Cancer; GERD; Diverticulosis; adenomatous colonic polyps; DVT (09-30-11); Arthritis; Hypothyroidism; Alz Dementia; Peripheral vascular disease; History of kidney stones; DDD; Asthma; and H/O vein stripping who presented to the ED with a chief complaint of fall in the bathroom. As per patient she went to the bathroom and felt dizzy and was unsteady on her feet and fell. She denied passing out, and family heard the fall they brought her to the hospital. As per family patient had not been doing well over the past 2 weeks, and had been more confused and unsteady on her feet. She had reportedly been eating well.  She was seen by Cardiology on 3/18, and her dose of Lasix was increased to 40 mg in the morning and 20 mg in the evening for worsening dyspnea. Her last creatinine on 3/19 who was 1.0. At the time of her admit her creatinine was found to be 1.90, w/ BUN 46. Patient was found to be hypothermic and hypotensive with rectal temperature of 95.6, mild elevation of troponin to 0.12. UA was clear, chest x-ray showed no infiltrate. WBC 11.6. She denied nausea, diarrhea. She denied any chest pain or shortness of breath. She had one episode of vomiting in the ambulance. She was admitted for further evaluation and management.  HPI/Subjective: Yesterday afternoon patient had confusion that lasted until about 6:30 per family and resolved. She is alert oriented and appropriate this a.m. She denies chest pain and no shortness of breath.  Assessment/Plan:  Acute PE-submassive, with right heart strain -Patient reports no further chest pain this a.m. -I have consulted pulmonology for further recommendations and given that she has a PE with DVT in the setting of subarachnoid hemorrhage -heparin started  pm of 4/1 per cardiology for possible non-STEMI, was dc'd 4/2 per ccm due to [facility] -Pt had ivc filter placed 4/2, appreciate pulm assistance -Homocystine level within normal limits, rest of hypercoagulable panel pending -Patient remaining stable at this time, will transfer to telemetry -CCM to followup on Monday otherwise they recommend outpatient followup with Dr. Lamonte Sakai when discharged.  NSTEMI with continued CP -Per cardiology-was initially thought to be a demand ischemia, and on followup today patient with chest pain persistent since last PM -Repeat EKG done and per cards abnormal anterior T waves noted and theST changes more marked than in the past -Started on heparin and nitroglycerin drip per cards and transferred to step down on 4/1 -On 4/1 Consulted and discussed patient with neurosurgery/Dr. Arnoldo Morale in the light of her small traumatic subarachnoid bleed sustained prior to this admission and he recommends for treating MDs to weigh the risks versus benefits and that under the circumstances however, if he were the patient, he would accept the small risk of worsening intracranial hemorrhage with heparin anticoagulated for the benefit of limiting cardiac damage with an acute coronary syndrome. -On followup 4/2 cardiology attributed her troponin and EKG changes to the pulmonary emboli, patient was still on nitroglycerin >>dc'ed this am 4/3 per cardiology.  Hypovolemic shock - Dehydration - Hypotension SBP 50 mm Hg at presentation - no clinical evidence to suggest sepsis - BP has normalized w/ simple volume resuscitation - possible simple over diuresis in setting of preload dependent Pulm HTN?  -BP remains stable off IV fluids -large PE was likely contributing factor to hypotension, resolved and remained stable.  Elevated d-dimer/acute to subacute left lower extremity DVT Large PE could explain many of presenting sx - B legs are enlarged, but no unilateral edema noted -Doppler ultrasound of lower  extremities office 3/31 with acute to subacute DVT involving the left lower extremity - Patient now with chest pain and d-dimer markedly elevated  On 3/31 -As above CT scan with our submassive PE with right reticular strain, -as above s/p IVC filter 4/2, hypercoagulable panel ordered as above.  Hypothermia likely due to profound dehydration - resolved - no evidence of active infection at this time   Acute kidney injury  baseline Cr 1-1.2 - crt improved w/ fluids -Creatinine normalized to 1.05 on 4/1, remains within normal limits at 1 today.   Mild transaminitis likely very mild case of "shock liver" - recheck in AM  ALT trending down, no significant change in the AST  Chronic diastolic CHF -Compensated  Moderate pulm HTN  Parox Afib Rate currently well controlled - apparently was primarily only an issue during recent lung surgery   Small subarachnoid bleed -Followup CT scan today shows interval improvement, no new area of hemorrhage or infarction -Please see as discussed above with neurosurgery consulted on 4/1/ Dr. Arnoldo Morale -CT repeated again last PM 4/3 due to acute confusion- showed only possible minimal residual SAH. - she remains stable this a.m. with no new neuro findings, Continue to monitor her closely.   Hx [facility] CA s/p RUL lobectomy 2014  HLD  GERD -Place on PPI   Hx of R LE DVT 2012 See discussion above   Hypothyroidism Cont synthroid - check TSH  Alz Dementia with acute encephalopathy -Back to baseline mentation and this a.m. -Will add when necessary Risperdal, continue Aricept  Hx of PVD  Code Status: FULL Family Communication:  daughter [name] via phone Disposition Plan:  We'll transfer back to telemetry  Consultants: -Cardiology -CCM   Procedures: IVC filter-placed on 4/2  Antibiotics: Aztreonam 3/30 Levaquin 3/30 Vanc 3/30  DVT prophylaxis: SCDs  Objective: Blood pressure 151/77, pulse 69, temperature 98 F (36.7 C), temperature  source Oral, resp. rate 16, height 5\' 3"  (1.6 m), weight 88.9 kg (195 lb 15.8 oz), SpO2 94.00%.  Intake/Output Summary (Last 24 hours) at 03/02/14 1234 Last data filed at 03/01/14 1907  Gross per 24 hour  Intake    480 ml  Output    250 ml  Net    230 ml   Exam: General: No acute respiratory distress - alert and appropriate  - conversant  Lungs: Clear to auscultation bilaterally without wheezes or crackles Cardiovascular: Regular rate and rhythm without murmur gallop or rub  Abdomen: Nontender, nondistended, soft, bowel sounds positive, no rebound, no ascites, no appreciable mass Extremities: No significant cyanosis, clubbing, or edema bilateral lower extremities   Data Reviewed: Basic Metabolic Panel:  Recent Labs Lab 02/25/14 1106 02/26/14 0624 02/27/14 0521 02/28/14 0910 03/02/14 0327  NA 144 143 141 142 143  K 3.9 4.4 5.5* 4.3 3.8  CL 103 108 105 106 106  CO2 23 21 20 24 22   GLUCOSE 274* 113* 125* 109* 101*  BUN 46* 33* 27* 18 24*  CREATININE 1.90* 1.23* 1.05 1.00 1.22*  CALCIUM 9.1 8.8 9.1 8.6 8.4   Liver Function Tests:  Recent Labs Lab 02/25/14 1106 02/26/14 0624 02/27/14 0521  AST 64* 53* 55*  ALT 34 43* 39*  ALKPHOS 100 96 97  BILITOT 1.1 1.0 1.0  PROT 7.3 6.3 7.2  ALBUMIN 3.7 3.0* 3.2*  CBC:  Recent Labs Lab 02/25/14 1106 02/26/14 0624 02/27/14 0521 02/28/14 0910 03/01/14 0245 03/02/14 0327  WBC 11.6* 6.5 9.1 9.0 7.2 7.2  NEUTROABS 7.5  --   --   --   --   --   HGB 13.6 12.3 13.8 12.8 11.6* 11.7*  HCT 41.6 37.7 41.3 38.2 34.9* 34.9*  MCV 90.6 89.5 88.6 88.0 87.9 87.5  PLT 177 160 172 160 159 175   Cardiac Enzymes:  Recent Labs Lab 02/25/14 2305 02/26/14 0624 02/27/14 1140 02/27/14 1640 02/27/14 2255  TROPONINI 1.77* 0.84* 0.76* 0.39* 0.57*   BNP (last 3 results)  Recent Labs  08/20/13 0912 02/13/14 1532  PROBNP 73.0 212.0*   CBG:  Recent Labs Lab 02/25/14 1439  GLUCAP 71    Recent Results (from the past 240  hour(s))  CULTURE, BLOOD (ROUTINE X 2)     Status: None   Collection Time    02/25/14 11:30 AM      Result Value Ref Range Status   Specimen Description BLOOD HAND RIGHT   Final   Special Requests BOTTLES DRAWN AEROBIC AND ANAEROBIC 4CC   Final   Culture  Setup Time     Final   Value: 02/25/2014 16:34     Performed at [facility]   Culture     Final   Value:        BLOOD CULTURE RECEIVED NO GROWTH TO DATE CULTURE WILL BE HELD FOR 5 DAYS BEFORE ISSUING A FINAL NEGATIVE REPORT     Performed at [facility]   Report Status PENDING   Incomplete  CULTURE, BLOOD (ROUTINE X 2)     Status: None   Collection Time    02/25/14 11:40 AM      Result Value Ref Range Status   Specimen Description BLOOD HAND LEFT   Final   Special Requests BOTTLES DRAWN AEROBIC ONLY 3CC   Final   Culture  Setup Time     Final   Value: 02/25/2014 16:34     Performed at [facility]   Culture     Final   Value:        BLOOD CULTURE RECEIVED NO GROWTH TO DATE CULTURE WILL BE HELD FOR 5 DAYS BEFORE ISSUING A FINAL NEGATIVE REPORT     Performed at [facility]   Report Status PENDING   Incomplete  URINE CULTURE     Status: None   Collection Time    02/25/14 12:00 PM      Result Value Ref Range Status   Specimen Description URINE, RANDOM   Final   Special Requests NONE   Final   Culture  Setup Time     Final   Value: 02/25/2014 17:41     Performed at [facility]     Final   Value: NO GROWTH     Performed at [facility]   Culture     Final   Value: NO GROWTH     Performed at [facility]   Report Status 02/26/2014 FINAL   Final  MRSA PCR SCREENING     Status: None   Collection Time    02/25/14  5:44 PM      Result Value Ref Range Status   MRSA by PCR NEGATIVE  NEGATIVE Final   Comment:            The GeneXpert MRSA Assay (FDA     approved for  NASAL specimens     only), is one component of a     comprehensive MRSA colonization      surveillance program. It is not     intended to diagnose MRSA     infection nor to guide or     monitor treatment for     MRSA infections.     Studies:  Recent x-ray studies have been reviewed in detail by the Attending Physician  Scheduled Meds:  Scheduled Meds: . donepezil  10 mg Oral QHS  . feeding supplement (ENSURE COMPLETE)  237 mL Oral Q1500  . levothyroxine  137 mcg Oral QAC breakfast  . pantoprazole  40 mg Oral Daily    Time spent on care of this patient: 25 mins   [facility] Office  [phone] Pager - Text Page per Shea Evans as per below:  On-Call/Text Page:      [email or web address]      password [email or web address]  If 7PM-7AM, please contact night-coverage [email or web address] Password [email or web address] 03/02/2014, 12:34 PM   LOS: 5 days

## 2014-03-03 LAB — CBC
HEMATOCRIT: 36.7 % (ref 36.0–46.0)
Hemoglobin: 12.4 g/dL (ref 12.0–15.0)
MCH: 29.3 pg (ref 26.0–34.0)
MCHC: 33.8 g/dL (ref 30.0–36.0)
MCV: 86.8 fL (ref 78.0–100.0)
Platelets: 195 10*3/uL (ref 150–400)
RBC: 4.23 MIL/uL (ref 3.87–5.11)
RDW: 14.3 % (ref 11.5–15.5)
WBC: 6.9 10*3/uL (ref 4.0–10.5)

## 2014-03-03 LAB — CULTURE, BLOOD (ROUTINE X 2)
CULTURE: NO GROWTH
Culture: NO GROWTH

## 2014-03-03 LAB — BASIC METABOLIC PANEL
BUN: 21 mg/dL (ref 6–23)
CHLORIDE: 107 meq/L (ref 96–112)
CO2: 24 meq/L (ref 19–32)
Calcium: 8.3 mg/dL — ABNORMAL LOW (ref 8.4–10.5)
Creatinine, Ser: 1.05 mg/dL (ref 0.50–1.10)
GFR calc Af Amer: 59 mL/min — ABNORMAL LOW (ref >90)
GFR, EST NON AFRICAN AMERICAN: 51 mL/min — AB (ref >90)
GLUCOSE: 94 mg/dL (ref 70–99)
POTASSIUM: 3.6 meq/L — AB (ref 3.7–5.3)
SODIUM: 145 meq/L (ref 137–147)

## 2014-03-03 NOTE — Progress Notes (Signed)
Progress Note  Alexa [name] QIO:[id number] DOB: September 07, 1936 DOA: 02/25/2014 PCP: Laurey Morale, MD  Admit HPI / Brief Narrative: 78 year old female who has a past medical history of Hyperlipidemia; Hypertension; Cancer; GERD; Diverticulosis; adenomatous colonic polyps; DVT (09-30-11); Arthritis; Hypothyroidism; Alz Dementia; Peripheral vascular disease; History of kidney stones; DDD; Asthma; and H/O vein stripping who presented to the ED with a chief complaint of fall in the bathroom. As per patient she went to the bathroom and felt dizzy and was unsteady on her feet and fell. She denied passing out, and family heard the fall they brought her to the hospital. As per family patient had not been doing well over the past 2 weeks, and had been more confused and unsteady on her feet. She had reportedly been eating well.  She was seen by Cardiology on 3/18, and her dose of Lasix was increased to 40 mg in the morning and 20 mg in the evening for worsening dyspnea. Her last creatinine on 3/19 who was 1.0. At the time of her admit her creatinine was found to be 1.90, w/ BUN 46. Patient was found to be hypothermic and hypotensive with rectal temperature of 95.6, mild elevation of troponin to 0.12. UA was clear, chest x-ray showed no infiltrate. WBC 11.6. She denied nausea, diarrhea. She denied any chest pain or shortness of breath. She had one episode of vomiting in the ambulance. She was admitted for further evaluation and management.  HPI/Subjective: Sitting up in chair, denies any c/o.  Assessment/Plan:  Acute PE-submassive, with right heart strain -Patient reports no further chest pain this a.m. -I have consulted pulmonology for further recommendations and given that she has a PE with DVT in the setting of subarachnoid hemorrhage -heparin started pm of 4/1 per cardiology for possible non-STEMI, was dc'd 4/2 per ccm due to [facility] -Pt had ivc filter placed 4/2, appreciate pulm assistance -Homocystine and  antithrombin level within normal limits, rest of hypercoagulable panel pending -Patient remaining stable at this time, follow and plan to d/c in am if continues to do well -CCM to followup on Monday otherwise they recommend outpatient followup with Dr. Lamonte Sakai when discharged.  NSTEMI with continued CP -Per cardiology-was initially thought to be a demand ischemia, and on followup today patient with chest pain persistent since last PM -Repeat EKG done and per cards abnormal anterior T waves noted and theST changes more marked than in the past -Started on heparin and nitroglycerin drip per cards and transferred to step down on 4/1 -On 4/1 Consulted and discussed patient with neurosurgery/Dr. Arnoldo Morale in the light of her small traumatic subarachnoid bleed sustained prior to this admission and he recommends for treating MDs to weigh the risks versus benefits and that under the circumstances however, if he were the patient, he would accept the small risk of worsening intracranial hemorrhage with heparin anticoagulated for the benefit of limiting cardiac damage with an acute coronary syndrome. -On followup 4/2 cardiology attributed her troponin and EKG changes to the pulmonary emboli, patient was still on nitroglycerin >>dc'ed this am 4/3 per cardiology.  Hypovolemic shock - Dehydration - Hypotension SBP 50 mm Hg at presentation - no clinical evidence to suggest sepsis - BP has normalized w/ simple volume resuscitation - possible simple over diuresis in setting of preload dependent Pulm HTN?  -BP remains stable off IV fluids -large PE was likely contributing factor to hypotension, resolved and remained stable.  Elevated d-dimer/acute to subacute left lower extremity DVT Large PE could explain many  of presenting sx - B legs are enlarged, but no unilateral edema noted -Doppler ultrasound of lower extremities office 3/31 with acute to subacute DVT involving the left lower extremity - Patient now with chest  pain and d-dimer markedly elevated  On 3/31 -As above CT scan with our submassive PE with right reticular strain, -as above s/p IVC filter 4/2, hypercoagulable panel ordered as above.  Hypothermia likely due to profound dehydration - resolved - no evidence of active infection at this time   Acute kidney injury  baseline Cr 1-1.2 - crt improved w/ fluids -Creatinine normalized to 1.05 on 4/1, remains within normal limits at 1 today.   Mild transaminitis likely very mild case of "shock liver" - recheck in AM  ALT trending down, no significant change in the AST  Chronic diastolic CHF -Compensated  Moderate pulm HTN  Parox Afib Rate currently well controlled - apparently was primarily only an issue during recent lung surgery   Small subarachnoid bleed -Followup CT scan today shows interval improvement, no new area of hemorrhage or infarction -Please see as discussed above with neurosurgery consulted on 4/1/ Dr. Arnoldo Morale -CT repeated again last PM 4/3 due to acute confusion- showed only possible minimal residual SAH. - she remains stable this a.m. with no new neuro findings, Continue to monitor her closely.   Hx [facility] s/p RUL lobectomy 2014  HLD  GERD -Place on PPI   Hx of R LE DVT 2012 See discussion above   Hypothyroidism Cont synthroid - check TSH  Alz Dementia with acute encephalopathy -Back to baseline mentation and this a.m. -Will add when necessary Risperdal, continue Aricept  Hx of PVD  Code Status: FULL Family Communication:  daughter Alexa [name] at bedside Disposition Plan:  To home when medically ready.  Consultants: -Cardiology -CCM -NS  Procedures: IVC filter-placed on 4/2  Antibiotics: Aztreonam 3/30 Levaquin 3/30 Vanc 3/30  DVT prophylaxis: SCDs  Objective: Blood pressure 170/82, pulse 59, temperature 97.7 F (36.5 C), temperature source Oral, resp. rate 20, height 5\' 2"  (1.575 m), weight 89 kg (196 lb 3.4 oz), SpO2  100.00%.  Intake/Output Summary (Last 24 hours) at 03/03/14 1031 Last data filed at 03/03/14 0941  Gross per 24 hour  Intake    240 ml  Output    302 ml  Net    -62 ml   Exam: General: No acute respiratory distress - alert and appropriate  - conversant  Lungs: Clear to auscultation bilaterally without wheezes or crackles Cardiovascular: Regular rate and rhythm without murmur gallop or rub  Abdomen: Nontender, nondistended, soft, bowel sounds positive, no rebound, no ascites, no appreciable mass Extremities: No significant cyanosis, clubbing, or edema bilateral lower extremities   Data Reviewed: Basic Metabolic Panel:  Recent Labs Lab 02/26/14 0624 02/27/14 0521 02/28/14 0910 03/02/14 0327 03/03/14 0500  NA 143 141 142 143 145  K 4.4 5.5* 4.3 3.8 3.6*  CL 108 105 106 106 107  CO2 21 20 24 22 24   GLUCOSE 113* 125* 109* 101* 94  BUN 33* 27* 18 24* 21  CREATININE 1.23* 1.05 1.00 1.22* 1.05  CALCIUM 8.8 9.1 8.6 8.4 8.3*   Liver Function Tests:  Recent Labs Lab 02/25/14 1106 02/26/14 0624 02/27/14 0521  AST 64* 53* 55*  ALT 34 43* 39*  ALKPHOS 100 96 97  BILITOT 1.1 1.0 1.0  PROT 7.3 6.3 7.2  ALBUMIN 3.7 3.0* 3.2*   CBC:  Recent Labs Lab 02/25/14 1106  02/27/14 0521 02/28/14 0910 03/01/14  0245 03/02/14 0327 03/03/14 0500  WBC 11.6*  < > 9.1 9.0 7.2 7.2 6.9  NEUTROABS 7.5  --   --   --   --   --   --   HGB 13.6  < > 13.8 12.8 11.6* 11.7* 12.4  HCT 41.6  < > 41.3 38.2 34.9* 34.9* 36.7  MCV 90.6  < > 88.6 88.0 87.9 87.5 86.8  PLT 177  < > 172 160 159 175 195  < > = values in this interval not displayed. Cardiac Enzymes:  Recent Labs Lab 02/25/14 2305 02/26/14 0624 02/27/14 1140 02/27/14 1640 02/27/14 2255  TROPONINI 1.77* 0.84* 0.76* 0.39* 0.57*   BNP (last 3 results)  Recent Labs  08/20/13 0912 02/13/14 1532  PROBNP 73.0 212.0*   CBG:  Recent Labs Lab 02/25/14 1439  GLUCAP 71    Recent Results (from the past 240 hour(s))  CULTURE,  BLOOD (ROUTINE X 2)     Status: None   Collection Time    02/25/14 11:30 AM      Result Value Ref Range Status   Specimen Description BLOOD HAND RIGHT   Final   Special Requests BOTTLES DRAWN AEROBIC AND ANAEROBIC 4CC   Final   Culture  Setup Time     Final   Value: 02/25/2014 16:34     Performed at [facility]   Culture     Final   Value:        BLOOD CULTURE RECEIVED NO GROWTH TO DATE CULTURE WILL BE HELD FOR 5 DAYS BEFORE ISSUING A FINAL NEGATIVE REPORT     Performed at [facility]   Report Status PENDING   Incomplete  CULTURE, BLOOD (ROUTINE X 2)     Status: None   Collection Time    02/25/14 11:40 AM      Result Value Ref Range Status   Specimen Description BLOOD HAND LEFT   Final   Special Requests BOTTLES DRAWN AEROBIC ONLY 3CC   Final   Culture  Setup Time     Final   Value: 02/25/2014 16:34     Performed at [facility]   Culture     Final   Value:        BLOOD CULTURE RECEIVED NO GROWTH TO DATE CULTURE WILL BE HELD FOR 5 DAYS BEFORE ISSUING A FINAL NEGATIVE REPORT     Performed at [facility]   Report Status PENDING   Incomplete  URINE CULTURE     Status: None   Collection Time    02/25/14 12:00 PM      Result Value Ref Range Status   Specimen Description URINE, RANDOM   Final   Special Requests NONE   Final   Culture  Setup Time     Final   Value: 02/25/2014 17:41     Performed at [facility]     Final   Value: NO GROWTH     Performed at [facility]   Culture     Final   Value: NO GROWTH     Performed at [facility]   Report Status 02/26/2014 FINAL   Final  MRSA PCR SCREENING     Status: None   Collection Time    02/25/14  5:44 PM      Result Value Ref Range Status   MRSA by PCR NEGATIVE  NEGATIVE Final   Comment:  The GeneXpert MRSA Assay (FDA     approved for NASAL specimens     only), is one component of a     comprehensive MRSA colonization     surveillance  program. It is not     intended to diagnose MRSA     infection nor to guide or     monitor treatment for     MRSA infections.     Studies:  Recent x-ray studies have been reviewed in detail by the Attending Physician  Scheduled Meds:  Scheduled Meds: . donepezil  10 mg Oral QHS  . feeding supplement (ENSURE COMPLETE)  237 mL Oral Q1500  . levothyroxine  137 mcg Oral QAC breakfast  . pantoprazole  40 mg Oral Daily    Time spent on care of this patient: 25 mins   [facility] Office  [phone] Pager - Text Page per Shea Evans as per below:  On-Call/Text Page:      [email or web address]      password [email or web address]  If 7PM-7AM, please contact night-coverage [email or web address] Password [email or web address] 03/03/2014, 10:31 AM   LOS: 6 days

## 2014-03-04 LAB — PROTEIN S, TOTAL: Protein S Ag, Total: 82 % (ref 60–150)

## 2014-03-04 LAB — CARDIOLIPIN ANTIBODIES, IGG, IGM, IGA
ANTICARDIOLIPIN IGA: 9 U/mL — AB (ref <22)
Anticardiolipin IgG: 5 GPL U/mL — ABNORMAL LOW (ref <23)
Anticardiolipin IgM: 13 MPL U/mL — ABNORMAL HIGH (ref <11)

## 2014-03-04 LAB — PROTEIN C, TOTAL: PROTEIN C, TOTAL: 61 % — AB (ref 72–160)

## 2014-03-04 LAB — BETA-2-GLYCOPROTEIN I ABS, IGG/M/A
BETA 2 GLYCO I IGG: 9 G Units (ref <20)
Beta-2-Glycoprotein I IgA: 15 A Units (ref <20)
Beta-2-Glycoprotein I IgM: 40 M Units — ABNORMAL HIGH (ref <20)

## 2014-03-04 LAB — LUPUS ANTICOAGULANT PANEL
DRVVT: 29 secs (ref <42.9)
Lupus Anticoagulant: NOT DETECTED
PTT LA: 29.3 s (ref 28.0–43.0)

## 2014-03-04 LAB — CBC
HEMATOCRIT: 37.2 % (ref 36.0–46.0)
HEMOGLOBIN: 12.5 g/dL (ref 12.0–15.0)
MCH: 29.1 pg (ref 26.0–34.0)
MCHC: 33.6 g/dL (ref 30.0–36.0)
MCV: 86.7 fL (ref 78.0–100.0)
Platelets: 216 10*3/uL (ref 150–400)
RBC: 4.29 MIL/uL (ref 3.87–5.11)
RDW: 14.2 % (ref 11.5–15.5)
WBC: 8.5 10*3/uL (ref 4.0–10.5)

## 2014-03-04 LAB — FACTOR 5 LEIDEN

## 2014-03-04 LAB — PROTEIN C ACTIVITY: PROTEIN C ACTIVITY: 77 % (ref 75–133)

## 2014-03-04 LAB — PROTEIN S ACTIVITY: Protein S Activity: 68 % — ABNORMAL LOW (ref 69–129)

## 2014-03-04 MED ORDER — FUROSEMIDE 40 MG PO TABS: 20.0000 mg | ORAL_TABLET | Freq: Two times a day (BID) | ORAL | Status: DC

## 2014-03-04 MED ORDER — OXYCODONE-ACETAMINOPHEN 10-325 MG PO TABS: 0.5000 | ORAL_TABLET | ORAL | Status: DC | PRN

## 2014-03-04 MED ORDER — ENSURE COMPLETE PO LIQD: 237.0000 mL | Freq: Every day | ORAL | Status: DC

## 2014-03-04 NOTE — Progress Notes (Signed)
BRIEF PATIENT DESCRIPTION: 78 yo female with extensive PMH including DVT, lung ca s/p RULectomy, mod pulm HTN, PAF, dCHF admitted 3/30 after a fall at home. Had significant dehydration, hypotension, elevated troponin and small SAH. Further w/u for hypoxia and dyspnea revealed acute PE with evidence of R heart strain.   SIGNIFICANT EVENTS / STUDIES:  CT head 3/30>>> small SAH posterior L  2D echo 3/31>>> EF 55-60%, severely dilated RV and massively dilated RA, PA peak press 53mmHg  BLE venous dopplers 3/31>>> LLE DVT  CTA chest 4/2>>>Positive for multiple acute PE with CTevidence of right heart strain consistent with at least submassive PE 4/2 IVC flter place  Subjective: Pt doing well, no increased wob.  Pt on RA  Objective: Vital signs in last 24 hours: Blood pressure 144/74, pulse 68, temperature 98 F (36.7 C), temperature source Oral, resp. rate 20, height 5\' 2"  (1.575 m), weight 89 kg (196 lb 3.4 oz), SpO2 94.00%.  Intake/Output from previous day: 04/05 0701 - 04/06 0700 In: -  Out: 1 [Stool:1]   Physical Exam:   wd female, in nad Nose without purulence or d/c noted Neck without LN or TMG Chest clear, no wheezing Cor with rrr LE with minimal edema, no cyanosis Alert and oriented, moves all 4.      Assessment/Plan:  Acute submassive PE - with R heart strain. Previous hx DVT. Likely explains elevated troponins, EKG changes as well.  REC -  -keep off anticoagulation -IVC filter in place   R heart strain/ elevated troponins - hemodynamically stable. Obvious contraindication to thrombolytics [facility]).   SAH  Cannot use a/c   I am ok with d/c of this pt home Plan f/u with Dr Lamonte Sakai in office after d/c Appt April 23 [place]  [phone]  Cell  [phone]  If no response or cell goes to voicemail, call beeper [phone]   03/04/2014, 9:28 AM

## 2014-03-04 NOTE — Discharge Summary (Signed)
Physician Discharge Summary  Alexa [name] XBJ:[id number] DOB: 21-Apr-1936 DOA: 02/25/2014  PCP: Laurey Morale, MD  Admit date: 02/25/2014 Discharge date: 03/04/2014  Time spent: >30 minutes  Recommendations for Outpatient Follow-up:  Follow-up Information   Follow up with Laurey Morale, MD. (in 1week, call for appt upon discharge)    Specialty:  Family Medicine   Contact information:   [place] [address] [phone]       Follow up with Collene Gobble., MD On 03/21/2014. (at 10:30AM)    Specialty:  Pulmonary Disease   Contact information:   [address] [place] [address] [phone]       Discharge Diagnoses:  Principal Problem:   Dehydration Active Problems:   Sepsis   AKI (acute kidney injury)   Diastolic CHF, chronic   Dementia   Chest pain   Abnormal EKG   Hypertension   Acute pulmonary embolism   Ventricular tachycardia   Right ventricular dysfunction   Discharge Condition: Improved/stable  Diet recommendation: Low sodium heart healthy  Filed Weights   03/02/14 1805 03/03/14 0500 03/04/14 0501  Weight: 86.183 kg (190 lb) 89 kg (196 lb 3.4 oz) 89 kg (196 lb 3.4 oz)    History of present illness:  78 year old female who has a past medical history of Hyperlipidemia; Hypertension; Cancer; GERD (gastroesophageal reflux disease); Diverticulosis; adenomatous colonic polyps; DVT (deep venous thrombosis) (09-30-11); Arthritis; Hypothyroidism; Dementia; Peripheral vascular disease (13); History of kidney stones; DDD (degenerative disc disease); Asthma; and H/O vein stripping.  Presents to the ED chief complaint of fall in the bathroom. Patient has history of Alzheimer's dementia but is able to provide good history, as per patient she went to the bathroom and felt dizzy and was unsteady on her feet and fell. She denies passing out, and family heard the fall they brought her to the hospital. As per family patient has not been doing well  over the past 2 weeks, she has been more confused, unsteady on her feet. She has been eating well but not sure about drinking water. Patient takes morphine 30 mg 3 times a day when necessary for chronic back pain. But as per family she has been taking this medication for and there was no recent change in medication. Patient is followed by pain clinic as outpatient.  Recently she was seen by cardiology on 3/18, and her dose of Lasix was increased to 40 mg in the morning and 20 mg in the evening for worsening dyspnea. Her last creatinine on 3/19 who was 1.0. Today her creatinine is 1.90, BUN is 46.  Patient also was found to be hypothermic with rectal temperature of 95.6, mild elevation of troponin to 0.12.  UA was clear, chest x-ray shows no infiltrate. WBC 11.6  Patient was profoundly hypotensive and she came to the ED, and now has responded well to the IV fluids. Patient received 3 L of IV normal saline in ED.  She denies nausea, diarrhea. She denies any chest pain or shortness of breath. She had one episode of vomiting in the ambulance. She was admitted for further evaluation and management and he.  Hospital Course:  Acute PE-submassive, with right heart strain  -While in the hospital patient began having chest pain on p.m.of 3/31-4/1 and it was noted that he d-dimer from 331 was markedly elevated at 11. A CT angiogram of her chest was ordered and it came back positive for acute PE with CT evidence of right heart strain consistent with  at least submassive(intermediate risk) PE -pulmonology was consulted for further recommendations and given that she has a PE( with as discussed below Doppler ultrasound positive for DVT) in the setting of subarachnoid hemorrhage  -heparin started pm of 4/1 per cardiology for possible non-STEMI(see #2), was dc'd 4/2 per ccm due to [facility]  -Pt had ivc filter placed 4/2, appreciate pulm assistance  -Homocystine and antithrombin level within normal limits, rest of  hypercoagulable panel pending>> patient is to followup with her PCP/ pulmonology for the results  -Her chest pain resolved, and she has remained asymptomatic and hemodynamically stable  -CCM followed up today and per Dr. Joya Gaskins okay to discharge, she is to follow up with Dr. Kyung Rudd on 4/23 as scheduled -Again as above she is to avoid all anticoagulation due to  [facility] as mentioned above NSTEMI with continued CP  - on admission patient was found to have elevated troponins and cardiology was consulted and followed patient. It  was initially thought to be a demand ischemia that she was not having chest pain initially, but on followup on 4/1  patient with chest pain persistent as above and-Repeat EKG done and per cards abnormal anterior T waves noted and theST changes more marked than in the past  -Started on heparin and nitroglycerin drip per cards and transferred to step down on 4/1  -On 4/1 Consulted and discussed patient with neurosurgery/Dr. Arnoldo Morale in the light of her small traumatic subarachnoid bleed sustained prior to this admission and he recommends for treating MDs to weigh the risks versus benefits and that under the circumstances however, if he were the patient, he would accept the small risk of worsening intracranial hemorrhage with heparin anticoagulated for the benefit of limiting cardiac damage with an acute coronary syndrome.  -On followup 4/2 cardiology attributed her troponin and EKG changes to the pulmonary emboli, patient was still on nitroglycerin >>dc'ed this am 4/3 per cardiology.She is to followup with her cardiologist Dr. Aundra Dubin on discharge.   Hypovolemic shock - Dehydration - Hypotension  SBP 50 mm Hg at presentation - no clinical evidence to suggest sepsis - BP has normalized w/ simple volume resuscitation - possible simple over diuresis in setting of preload dependent Pulm HTN?  -BP remained he the  stable off IV fluids  -large PE was likely contributing factor to hypotension,  resolved and remained stable.  Elevated d-dimer/acute to subacute left lower extremity DVT  Large PE could explain many of presenting sx - B legs are enlarged, but no unilateral edema noted  -Doppler ultrasound of lower extremities on 3/31 with acute to subacute DVT involving the left lower extremity  - Patient with chest pain and d-dimer markedly elevated On 3/31  -As above CT scan with our submassive PE with right reticular strain,  -as above s/p IVC filter 4/2, hypercoagulable panel ordered as above.  Hypothermia  likely due to profound dehydration - resolved - no evidence of active infection at this time  Acute kidney injury  baseline Cr 1-1.2 - crt improved w/ fluids  -Creatinine normalized to 1.05 on 4/1, remains within normal limits at 1 today.  Mild transaminitis  likely very mild case of "shock liver" - recheck in AM  ALT trending down, no significant change in the AST  Chronic diastolic CHF  -Compensated  Moderate pulm HTN  Parox Afib  Rate currently well controlled - apparently was primarily only an issue during recent lung surgery  Small subarachnoid bleed  -Followup CT scan today shows interval improvement, no  new area of hemorrhage or infarction  -Please see as discussed above with neurosurgery consulted on 4/1/ Dr. Arnoldo Morale  -CT repeated again last PM 4/3 due to acute confusion- showed only possible minimal residual SAH.  - she remains stable this a.m. with no new neuro findings, Continue to monitor her closely. GERD  - she was placed on a PPI in the hospital.   Hx of R LE DVT 2012  See discussion above  Hypothyroidism  Cont synthroid - check TSH  Alz Dementia with acute encephalopathy  -Back to baseline mentation and this a.m.  -She had an episode of confusion in the hospital that resolved spontaneously, and following that she was placed on when necessary risperidone but she never required it. -continue Aricept, follow up with PCP Hx of PVD Hx [facility] s/p RUL  lobectomy 2014  HLD  Consultants:  -Cardiology  -CCM  -NS  Procedures:  IVC filter -placed on 4/2 Echocardiogram Study Conclusions  - Left ventricle: The cavity size was normal. Wall thickness was normal. Systolic function was normal. The estimated ejection fraction was in the range of 55% to 65%, with beat to beat variation. The study is not technically sufficient to allow evaluation of LV diastolic function. - Left atrium: The atrium was normal in size. - Right ventricle: The cavity size was severely dilated. RV systolic pressure: 81OF Hg (S, est). - Right atrium: Massively dilated (24 cm2). - Tricuspid valve: Dilated annulus at 6.5 cm. Moderate to severe regurgitation. - Pulmonary arteries: PA peak pressure: 32mm Hg (S). Moderate pulmonary hypertension. - Inferior vena cava: The vessel was dilated; the respirophasic diameter changes were blunted (< 50%); findings are consistent with elevated central venous pressure. Transthoracic echocardiography.  Discharge Exam: Filed Vitals:   03/04/14 0501  BP: 144/74  Pulse: 68  Temp: 98 F (36.7 C)  Resp: 20   Exam:  General: No acute respiratory distress - alert and appropriate - conversant  Lungs: Clear to auscultation bilaterally without wheezes or crackles  Cardiovascular: Regular rate and rhythm without murmur gallop or rub  Abdomen: Nontender, nondistended, soft, bowel sounds positive, no rebound, no ascites, no appreciable mass  Extremities: No significant cyanosis, clubbing, or edema bilateral lower extremities     Discharge Instructions You were cared for by a hospitalist during your hospital stay. If you have any questions about your discharge medications or the care you received while you were in the hospital after you are discharged, you can call the unit and asked to speak with the hospitalist on call if the hospitalist that took care of you is not available. Once you are discharged, your primary care physician  will handle any further medical issues. Please note that NO REFILLS for any discharge medications will be authorized once you are discharged, as it is imperative that you return to your primary care physician (or establish a relationship with a primary care physician if you do not have one) for your aftercare needs so that they can reassess your need for medications and monitor your lab values.  Discharge Orders   Future Appointments Provider Department Dept Phone   03/21/2014 10:30 AM Collene Gobble, MD [facility] [phone]   03/27/2014 10:30 AM Philmore Pali, NP [facility] [phone]   04/10/2014 11:30 AM Philmore Pali, NP [facility] [phone]   05/10/2014 11:30 AM Larey Dresser, MD [facility] [phone]   06/03/2014 9:00 AM [facility] [phone]   06/03/2014 9:30 AM Volanda Napoleon, MD [place] [phone]   Future Orders Complete By Expires   Diet - low sodium heart healthy  As directed    Increase activity slowly  As directed        Medication List    STOP taking these medications       aspirin EC 81 MG tablet      TAKE these medications       donepezil 10 MG tablet  Commonly known as:  ARICEPT  Take 1 tablet (10 mg total) by mouth at bedtime.     esomeprazole 40 MG packet  Commonly known as:  NEXIUM  Take 40 mg by mouth daily before breakfast.     feeding supplement (ENSURE COMPLETE) Liqd  Take 237 mLs by mouth daily at 3 pm.     furosemide 40 MG tablet  Commonly known as:  LASIX  Take 0.5 tablets (20 mg total) by mouth 2 (two) times daily.     levothyroxine 137 MCG tablet  Commonly known as:  SYNTHROID, LEVOTHROID  Take 137 mcg by mouth daily before breakfast.     morphine 30 MG tablet  Commonly known as:  MSIR  Take 30 mg by mouth 3 (three) times daily as needed for pain.     oxyCODONE-acetaminophen 10-325 MG  per tablet  Commonly known as:  PERCOCET  Take 0.5 tablets by mouth every 4 (four) hours as needed (pain).     potassium chloride SA 20 MEQ tablet  Commonly known as:  K-DUR,KLOR-CON  Take 20 mEq by mouth daily.     tobramycin-dexamethasone ophthalmic ointment  Commonly known as:  TOBRADEX  Place into both eyes daily. As needed     valsartan 320 MG tablet  Commonly known as:  DIOVAN  Take 1 tablet (320 mg total) by mouth daily.       Allergies  Allergen Reactions  . Strawberry Swelling    Swelling ,rash  . Hydrocodone-Homatropine     REACTION: Nausea  . Nsaids   . Penicillins     REACTION: rash  . Simvastatin     REACTION: muscle pain  . Statins     Myalgias with multiple statins        Follow-up Information   Follow up with Laurey Morale, MD. (in 1week, call for appt upon discharge)    Specialty:  Family Medicine   Contact information:   [place] [address] [phone]       Follow up with Collene Gobble., MD On 03/21/2014. (at 10:30AM)    Specialty:  Pulmonary Disease   Contact information:   [address] [place] [address] [phone]        The results of significant diagnostics from this hospitalization (including imaging, microbiology, ancillary and laboratory) are listed below for reference.    Significant Diagnostic Studies: Dg Chest 1 View  02/25/2014   CLINICAL DATA:  Hypotension, confusion  EXAM: CHEST - 1 VIEW  COMPARISON:  06/21/2013, 02/14/2014  FINDINGS: Postsurgical changes are noted in the thoracolumbar spine. Cardiac shadow is prominent but stable in appearance. Postsurgical changes are seen in the right hilum. No focal infiltrate is noted. No acute abnormality is seen.  IMPRESSION: No acute abnormality noted.   Electronically Signed   By: Inez Catalina M.D.   On: 02/25/2014 13:44   Ct Head Wo Contrast  03/01/2014   CLINICAL DATA:  Confusion  EXAM: CT HEAD WITHOUT CONTRAST  TECHNIQUE: Contiguous axial images  were obtained from the base of the skull through the vertex without intravenous contrast.  COMPARISON:  02/27/2013  FINDINGS: Faint hyperdensity medially of the left posterior parietal lobe could reflect a small amount of residual subarachnoid hemorrhage (series 2/ image 25), although this appears less discrete than on the prior studies.  No evidence of parenchymal hemorrhage. No mass lesion, mass effect, or midline shift.  No CT evidence of acute infarction.  Encephalomalacic changes in the left inferior frontal lobe (series 2/image 14).  Subcortical white matter and periventricular small vessel ischemic changes.  Cerebral volume is within normal limits.  No ventriculomegaly.  The visualized paranasal sinuses are essentially clear. The mastoid air cells are unopacified.  No evidence of calvarial fracture.  IMPRESSION: Possible minimal residual subarachnoid hemorrhage in the left posterior parietal lobe, equivocal.  No evidence of acute intracranial abnormality.  Encephalomalacic changes in the left inferior frontal lobe.   Electronically Signed   By: Julian Hy M.D.   On: 03/01/2014 15:49   Ct Head Wo Contrast  02/27/2014   CLINICAL DATA:  Follow-up subarachnoid hemorrhage  EXAM: CT HEAD WITHOUT CONTRAST  TECHNIQUE: Contiguous axial images were obtained from the base of the skull through the vertex without intravenous contrast.  COMPARISON:  CT head 02/25/2014  FINDINGS: Interval improvement in small amount of subarachnoid hemorrhage in the left parietal region. This is most likely a posttraumatic hemorrhage given its location in size. No subdural hemorrhage.  Mild atrophy. 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 No acute ischemic infarct. No mass or edema or midline shift. Negative for skull fracture.  IMPRESSION: Small amount of subarachnoid hemorrhage posteriorly on the left shows interval improvement. No new area of hemorrhage or infarction.   Electronically Signed   By: Franchot Gallo  M.D.   On: 02/27/2014 09:09   Ct Head Wo Contrast  02/25/2014   CLINICAL DATA:  Weakness. Unconscious. Questionable trauma. Dementia.  EXAM: CT HEAD WITHOUT CONTRAST  CT CERVICAL SPINE WITHOUT CONTRAST  TECHNIQUE: Multidetector CT imaging of the head and cervical spine was performed following the standard protocol without intravenous contrast. Multiplanar CT image reconstructions of the cervical spine were also generated.  COMPARISON:  05/16/2008 head CT.  FINDINGS: CT HEAD FINDINGS  No skull fracture.  Suggestion of tiny amount of subarachnoid blood versus tiny hemorrhagic contusion medial left parietal lobe (series 3, image 24 and 25).  Anterior left frontal lobe encephalomalacia suggestive of prior infarct.  No CT evidence of large acute infarct.  Global atrophy without hydrocephalus.  No intracranial mass lesion noted on this unenhanced exam.  CT CERVICAL SPINE FINDINGS  No cervical spine fracture.  Mild motion artifact C2 level.  No abnormal prevertebral soft tissue swelling.  Prior fusion C4-C7.  Cervical spondylotic changes most notable C3-4.  Post therapy type changes right upper lung.  IMPRESSION: CT head:  Suggestion of tiny amount of subarachnoid blood versus tiny hemorrhagic contusion medial left parietal lobe (series 3, image 24 and 25).  Remote anterior left frontal lobe infarct.  Atrophy.  CT cervical spine:  No cervical spine fracture.  Prior fusion C4-C7.  Cervical spondylotic changes most notable C3-4.  These results were called by telephone at the time of interpretation on 02/25/2014 at 1:33 PM to Dr. Riki Altes , who verbally acknowledged these results.   Electronically Signed   By: Chauncey Cruel M.D.   On: 02/25/2014 13:35   Ct Chest W  Contrast  02/14/2014   CLINICAL DATA:  Lung cancer with partial right lung resection. Radiation therapy complete. Prior history of breast cancer.  EXAM: CT CHEST WITH CONTRAST  TECHNIQUE: Multidetector CT imaging of the chest was performed during intravenous  contrast administration.  CONTRAST:  23mL OMNIPAQUE IOHEXOL 300 MG/ML  SOLN  COMPARISON:  DG CHEST 2 VIEW dated 06/21/2013; NM PET IMAGE INITIAL (PI) SKULL BASE TO THIGH dated 04/09/2013; CT CHEST W/O CM dated 04/05/2013  FINDINGS: No axillary or supraclavicular lymphadenopathy. Small paratracheal lymph nodes are not pathologic by size criteria. Right hilar lymph node measures 12 mm (image 23, series 2. Esophagus is normal.  Review of the lung windows demonstrate postsurgical change the right upper lobe. No nodularity. Left lung is clear. Airways are normal centrally.  Limited view of the upper abdomen demonstrates normal adrenal glands.  IMPRESSION: 1. No evidence level lung cancer recurrence. 2. Postoperative change in the right hemi thorax.   Electronically Signed   By: Suzy Bouchard M.D.   On: 02/14/2014 14:30   Ct Angio Chest Pe W/cm &/or Wo Cm  02/27/2014   CLINICAL DATA:  Shortness of breath.  EXAM: CT ANGIOGRAPHY CHEST WITH CONTRAST  TECHNIQUE: Multidetector CT imaging of the chest was performed using the standard protocol during bolus administration of intravenous contrast. Multiplanar CT image reconstructions and MIPs were obtained to evaluate the vascular anatomy.  CONTRAST:  131mL OMNIPAQUE IOHEXOL 350 MG/ML SOLN  COMPARISON:  DG CHEST 1 VIEW dated 02/25/2014; CT CHEST W/O CM dated 04/05/2013  FINDINGS: The thoracic inlet is unremarkable.  Filling defects are appreciated within the right upper and lower lobe pulmonary arteries, right lower lobe segmental branches, left upper lobe, and segmental branches of left lower lobe.  RV LV ratio 1.96 and bowing of the interventricular septum to the left.  There is no evidence of a thoracic aortic aneurysm. Atherosclerotic calcifications are appreciated within the thoracic aorta.  Cardiomegaly is appreciated. Peritracheal and subcarinal adenopathy is demonstrated.  Diffuse ill-defined pulmonary opacities are demonstrated within the posterior base left upper lobe,  superior segment left lower lobe, superior segment and base right lower lobe.  A small right pleural effusion is appreciated.  There are areas of subpleural and interlobular septal thickening in the lung bases.  The visualized upper abdominal viscera are grossly unremarkable.  Review of the MIP images confirms the above findings.  IMPRESSION: Positive for acute PE with CTevidence of right heart strain (RV/LV Ratio = 1.95) consistent with at least submassive (intermediate risk) PE. The presence of right heart strain has been associated with an increased risk of morbidity and mortality.  Critical Value/emergent results were called by telephone at the time of interpretation on 02/27/2014 at 9:19 PM to the patient's nurse Vedia Coffer, who verbally acknowledged these results AND WILL RELAY TO THE PATIENT'S ATTENDING PHYSICIAN.  Diffuse bilateral pulmonary infiltrates differential considerations infectious pneumonitis versus regions of infarction. Considering the findings on the comparison study more ominous etiologies cannot be excluded.  Small right pleural effusion   Electronically Signed   By: Margaree Mackintosh M.D.   On: 02/27/2014 21:23   Ct Cervical Spine Wo Contrast  02/25/2014   CLINICAL DATA:  Weakness. Unconscious. Questionable trauma. Dementia.  EXAM: CT HEAD WITHOUT CONTRAST  CT CERVICAL SPINE WITHOUT CONTRAST  TECHNIQUE: Multidetector CT imaging of the head and cervical spine was performed following the standard protocol without intravenous contrast. Multiplanar CT image reconstructions of the cervical spine were also generated.  COMPARISON:  05/16/2008 head  CT.  FINDINGS: CT HEAD FINDINGS  No skull fracture.  Suggestion of tiny amount of subarachnoid blood versus tiny hemorrhagic contusion medial left parietal lobe (series 3, image 24 and 25).  Anterior left frontal lobe encephalomalacia suggestive of prior infarct.  No CT evidence of large acute infarct.  Global atrophy without hydrocephalus.  No  intracranial mass lesion noted on this unenhanced exam.  CT CERVICAL SPINE FINDINGS  No cervical spine fracture.  Mild motion artifact C2 level.  No abnormal prevertebral soft tissue swelling.  Prior fusion C4-C7.  Cervical spondylotic changes most notable C3-4.  Post therapy type changes right upper lung.  IMPRESSION: CT head:  Suggestion of tiny amount of subarachnoid blood versus tiny hemorrhagic contusion medial left parietal lobe (series 3, image 24 and 25).  Remote anterior left frontal lobe infarct.  Atrophy.  CT cervical spine:  No cervical spine fracture.  Prior fusion C4-C7.  Cervical spondylotic changes most notable C3-4.  These results were called by telephone at the time of interpretation on 02/25/2014 at 1:33 PM to Dr. Riki Altes , who verbally acknowledged these results.   Electronically Signed   By: Chauncey Cruel M.D.   On: 02/25/2014 13:35   Ir Ivc Filter Plmt / S&i /img Guid/mod Sed  02/28/2014   CLINICAL DATA:  Acute bilateral sub-massive pulmonary embolism and right heart strain. Subarachnoid hemorrhage at the posterior vertex of the brain after fall and head injury. The patient requires an IVC filter.  EXAM: 1. ULTRASOUND GUIDANCE FOR VASCULAR ACCESS OF THE RIGHT INTERNAL JUGULAR VEIN. 2. IVC VENOGRAM. 3. PERCUTANEOUS IVC FILTER PLACEMENT.  ANESTHESIA/SEDATION: 2.0 mg IV Versed; 50 mcg IV Fentanyl.  Total Moderate Sedation Time  23minutes.  CONTRAST:  107mL OMNIPAQUE IOHEXOL 300 MG/ML  SOLN  FLUOROSCOPY TIME:  1 minutes and 36 seconds.  PROCEDURE: The procedure, risks, benefits, and alternatives were explained to the patient. Questions regarding the procedure were encouraged and answered. The patient understands and consents to the procedure.  The right neck was prepped with Betadine in a sterile fashion, and a sterile drape was applied covering the operative field. A sterile gown and sterile gloves were used for the procedure. Local anesthesia was provided with 1% Lidocaine.  Ultrasound was  used to confirm patency of the right internal jugular vein. Under direct ultrasound guidance, a 21 gauge needle was advanced into the right internal jugular vein with ultrasound image documentation performed. After securing access with a micropuncture dilator, a guidewire was advanced into the inferior vena cava. A deployment sheath was advanced over the guidewire. This was utilized to perform IVC venography.  The deployment sheath was further positioned in an appropriate location for filter deployment. A Bard Denali IVC filter was then advanced in the sheath. This was then fully deployed in the infrarenal IVC. Final filter position was confirmed with a fluoroscopic spot image. Contrast injection was also performed through the sheath under fluoroscopy to confirm patency of the IVC at the level of the filter. After the procedure the sheath was removed and hemostasis obtained with manual compression.  COMPLICATIONS: None.  FINDINGS: IVC venography demonstrates a normal caliber IVC with no evidence of thrombus. Renal veins are identified bilaterally. The IVC filter was successfully positioned below the level of the renal veins and is appropriately oriented. This IVC filter has both permanent and retrievable indications.  IMPRESSION: Placement of percutaneous IVC filter in infrarenal IVC. IVC venogram shows no evidence of IVC thrombus and normal caliber of the inferior vena cava. This  filter does have both permanent and retrievable indications.   Electronically Signed   By: Aletta Edouard M.D.   On: 02/28/2014 17:13    Microbiology: Recent Results (from the past 240 hour(s))  CULTURE, BLOOD (ROUTINE X 2)     Status: None   Collection Time    02/25/14 11:30 AM      Result Value Ref Range Status   Specimen Description BLOOD HAND RIGHT   Final   Special Requests BOTTLES DRAWN AEROBIC AND ANAEROBIC 4CC   Final   Culture  Setup Time     Final   Value: 02/25/2014 16:34     Performed at [facility]    Culture     Final   Value: NO GROWTH 5 DAYS     Performed at [facility]   Report Status 03/03/2014 FINAL   Final  CULTURE, BLOOD (ROUTINE X 2)     Status: None   Collection Time    02/25/14 11:40 AM      Result Value Ref Range Status   Specimen Description BLOOD HAND LEFT   Final   Special Requests BOTTLES DRAWN AEROBIC ONLY 3CC   Final   Culture  Setup Time     Final   Value: 02/25/2014 16:34     Performed at [facility]   Culture     Final   Value: NO GROWTH 5 DAYS     Performed at [facility]   Report Status 03/03/2014 FINAL   Final  URINE CULTURE     Status: None   Collection Time    02/25/14 12:00 PM      Result Value Ref Range Status   Specimen Description URINE, RANDOM   Final   Special Requests NONE   Final   Culture  Setup Time     Final   Value: 02/25/2014 17:41     Performed at [facility]     Final   Value: NO GROWTH     Performed at [facility]   Culture     Final   Value: NO GROWTH     Performed at [facility]   Report Status 02/26/2014 FINAL   Final  MRSA PCR SCREENING     Status: None   Collection Time    02/25/14  5:44 PM      Result Value Ref Range Status   MRSA by PCR NEGATIVE  NEGATIVE Final   Comment:            The GeneXpert MRSA Assay (FDA     approved for NASAL specimens     only), is one component of a     comprehensive MRSA colonization     surveillance program. It is not     intended to diagnose MRSA     infection nor to guide or     monitor treatment for     MRSA infections.     Labs: Basic Metabolic Panel:  Recent Labs Lab 02/26/14 0624 02/27/14 0521 02/28/14 0910 03/02/14 0327 03/03/14 0500  NA 143 141 142 143 145  K 4.4 5.5* 4.3 3.8 3.6*  CL 108 105 106 106 107  CO2 21 20 24 22 24   GLUCOSE 113* 125* 109* 101* 94  BUN 33* 27* 18 24* 21  CREATININE 1.23* 1.05 1.00 1.22* 1.05  CALCIUM 8.8 9.1 8.6 8.4 8.3*   Liver Function Tests:  Recent Labs Lab  02/25/14 1106 02/26/14  6010 02/27/14 0521  AST 64* 53* 55*  ALT 34 43* 39*  ALKPHOS 100 96 97  BILITOT 1.1 1.0 1.0  PROT 7.3 6.3 7.2  ALBUMIN 3.7 3.0* 3.2*   No results found for this basename: LIPASE, AMYLASE,  in the last 168 hours No results found for this basename: AMMONIA,  in the last 168 hours CBC:  Recent Labs Lab 02/25/14 1106  02/28/14 0910 03/01/14 0245 03/02/14 0327 03/03/14 0500 03/04/14 0720  WBC 11.6*  < > 9.0 7.2 7.2 6.9 8.5  NEUTROABS 7.5  --   --   --   --   --   --   HGB 13.6  < > 12.8 11.6* 11.7* 12.4 12.5  HCT 41.6  < > 38.2 34.9* 34.9* 36.7 37.2  MCV 90.6  < > 88.0 87.9 87.5 86.8 86.7  PLT 177  < > 160 159 175 195 216  < > = values in this interval not displayed. Cardiac Enzymes:  Recent Labs Lab 02/25/14 2305 02/26/14 0624 02/27/14 1140 02/27/14 1640 02/27/14 2255  TROPONINI 1.77* 0.84* 0.76* 0.39* 0.57*   BNP: BNP (last 3 results)  Recent Labs  08/20/13 0912 02/13/14 1532  PROBNP 73.0 212.0*   CBG:  Recent Labs Lab 02/25/14 1439  GLUCAP 71       Signed:  Richmond Coldren C  [facility] 03/04/2014, 11:05 AM

## 2014-03-04 NOTE — Progress Notes (Signed)
Patient given discharge teaching and paperwork regarding medications, diet, follow-up appointments and activity. Patient understanding verbalized. IV site discontinued. Skin assessment as previously charted and vitals are stable. Patient being discharged to home. Daughter present during discharge teaching. Toniann Ket, RN

## 2014-03-04 NOTE — Progress Notes (Signed)
03/04/14 Spoke with patient and her daughter about HHPT, they chose [facility] which they have worked with in the past. Scientist, physiological with [facility] and set up [facility]. Patient and daughter stated that patient in need of a new walker, has been over 5 yrs. Contacted Justin at [facility] and requested rolling walker be delivered to patient's room prior to discharge. Fuller Plan RN, BSN, CCM

## 2014-03-06 ENCOUNTER — Telehealth: Payer: Self-pay | Admitting: Family Medicine

## 2014-03-06 LAB — PROTHROMBIN GENE MUTATION

## 2014-03-06 NOTE — Telephone Encounter (Signed)
The patient fell and had a CT head in the ED , [facility] , which showed encephalomalacia and small SAH. No MRI is necessary now. Thank you .  Spoke to daughter , lisa Blankenbaker.  Please cancel MRI- Ammie , please  forward to renee mansir .

## 2014-03-06 NOTE — Telephone Encounter (Signed)
Pt dc'd from hospital yesterday, and daughter states they said fu w/ dr fry on Fri or mon.  No 30 except SD pls advise to sch.  Ft fell

## 2014-03-06 NOTE — Telephone Encounter (Signed)
I spoke with pt's daughter and she is going to schedule for Tuesday 03/12/14, due to a few more openings. Pt is stable and aware that Dr. Sarajane Jews is out of the office this week.

## 2014-03-07 NOTE — Telephone Encounter (Signed)
done

## 2014-03-08 ENCOUNTER — Encounter: Payer: Self-pay | Admitting: *Deleted

## 2014-03-08 NOTE — [facility] Oncology Navigator Note (Signed)
I called patient to check in following recent hospitalization.  I spoke to patient's daughter Shauna Hugh who said that her mother was not feeling well and was not able to come to the phone.  She also said that with her Altzheimer's disease, it would be difficult to understand her on the phone.  She reports that her mother had a fall and was taken to the emergency room where a PE was diagnosed and a lump on her head with bleeding inside her head in March.  She reports that her mother has had some increased confusion related to a stroke.  Her mother has a poor appetite and she is preparing food and encouraging her to eat.  We reviewed her upcoming appointments.  She was able to repeat the schedule and says that they are written down on the calendar.  She verified that she has my contact information and I encouraged her to call me for questions or concerns.

## 2014-03-11 ENCOUNTER — Encounter: Payer: Self-pay | Admitting: *Deleted

## 2014-03-11 NOTE — [facility] Oncology Navigator Note (Signed)
Patient's daughter, [name], called to clarify her mother's appointments.  She reports that her mother is doing well medically but that her cognitive status secondary to Altzheimers disease has declined significantly.  We reviewed patient's appointments and her daughter wrote them on her calendar.  She denied any other questions or concerns at this time.  I encouraged her to call me any time she has questions or needs.  She expressed appreciation and verbalized understanding.

## 2014-03-12 ENCOUNTER — Encounter: Payer: Self-pay | Admitting: Family Medicine

## 2014-03-12 ENCOUNTER — Ambulatory Visit (INDEPENDENT_AMBULATORY_CARE_PROVIDER_SITE_OTHER): Payer: Medicare Other | Admitting: Family Medicine

## 2014-03-12 VITALS — BP 150/84 | Temp 98.1°F | Ht 63.0 in | Wt 186.0 lb

## 2014-03-12 DIAGNOSIS — I2699 Other pulmonary embolism without acute cor pulmonale: Secondary | ICD-10-CM

## 2014-03-12 DIAGNOSIS — I509 Heart failure, unspecified: Secondary | ICD-10-CM

## 2014-03-12 DIAGNOSIS — I609 Nontraumatic subarachnoid hemorrhage, unspecified: Secondary | ICD-10-CM

## 2014-03-12 DIAGNOSIS — E876 Hypokalemia: Secondary | ICD-10-CM

## 2014-03-12 DIAGNOSIS — I5032 Chronic diastolic (congestive) heart failure: Secondary | ICD-10-CM

## 2014-03-12 DIAGNOSIS — F0390 Unspecified dementia without behavioral disturbance: Secondary | ICD-10-CM

## 2014-03-12 DIAGNOSIS — I82409 Acute embolism and thrombosis of unspecified deep veins of unspecified lower extremity: Secondary | ICD-10-CM

## 2014-03-12 DIAGNOSIS — I4891 Unspecified atrial fibrillation: Secondary | ICD-10-CM

## 2014-03-12 DIAGNOSIS — I10 Essential (primary) hypertension: Secondary | ICD-10-CM

## 2014-03-12 HISTORY — DX: Nontraumatic subarachnoid hemorrhage, unspecified: I60.9

## 2014-03-12 MED ORDER — HYDROCODONE-HOMATROPINE 5-1.5 MG/5ML PO SYRP: 5.0000 mL | ORAL_SOLUTION | ORAL | Status: DC | PRN

## 2014-03-12 MED ORDER — TERCONAZOLE 0.8 % VA CREA: 1.0000 | TOPICAL_CREAM | Freq: Every day | VAGINAL | Status: DC

## 2014-03-12 MED ORDER — TEMAZEPAM 30 MG PO CAPS: 30.0000 mg | ORAL_CAPSULE | Freq: Every evening | ORAL | Status: DC | PRN

## 2014-03-12 NOTE — Progress Notes (Signed)
Pre visit review using our clinic review tool, if applicable. No additional management support is needed unless otherwise documented below in the visit note. 

## 2014-03-12 NOTE — Progress Notes (Signed)
   Subjective:    Patient ID: Alexa [name], female    DOB: 12-Aug-1936, 78 y.o.   MRN: [id number]  HPI Here with her daughter to follow up a hospital stay from 02-25-14 to 03-04-14 for a number of issues including dehydration, a subarachnoid hemorrhage, a large PE , and a small MI. Initial hypercoagulability tests like Leiden factor were negative. There was quite a debate between Neurosurgery and Cardiology about the risks and benefits of anticoagulation, but it was felt the risks of further intracranial bleeding outweighed the risks of further coronary occlusion. All anticoagulation meds including aspirin were stopped. She had an IVC filter placed. She received antibiotics but no signs of infection were found. After going home she has improved quite a bit with more strength and an improved appetite. She denies SOB or chest pain. She has developed a yeast infection both in there perineal area and over the proximal thighs. She has been taking a few Diflucan pills here daughter gave her. She has had some PND with a dry cough but no fever. Also she complains of trouble sleeping at night. She tends to nap during the day.    Review of Systems  Constitutional: Negative.   HENT: Positive for postnasal drip. Negative for congestion and sinus pressure.   Respiratory: Positive for cough. Negative for apnea, chest tightness, shortness of breath and wheezing.   Cardiovascular: Negative.   Neurological: Negative.        Objective:   Physical Exam  Constitutional: She appears well-developed and well-nourished.  She looks good, is alert   Neck: Neck supple. No thyromegaly present.  Cardiovascular: Normal rate, regular rhythm, normal heart sounds and intact distal pulses.   Pulmonary/Chest: Effort normal. No respiratory distress. She has no wheezes. She has no rales.  Some soft rhonchi at the right base   Lymphadenopathy:    She has no cervical adenopathy.          Assessment & Plan:  She is doing  well after a large PE on no anticoagulation. Follow up with Dr. Lamonte Sakai on 03-21-14. Doing after a small SAH, follow up with Charlott Holler NP on 03-27-14. Doing well after a recent MI, follow up with Dr. Aundra Dubin on 05-10-14. Use Terazol vaginal cream . They Temazepam for sleep

## 2014-03-20 ENCOUNTER — Emergency Department (HOSPITAL_COMMUNITY): Payer: Medicare Other

## 2014-03-20 ENCOUNTER — Encounter (HOSPITAL_COMMUNITY): Payer: Self-pay | Admitting: Emergency Medicine

## 2014-03-20 ENCOUNTER — Emergency Department (HOSPITAL_COMMUNITY)
Admission: EM | Admit: 2014-03-20 | Discharge: 2014-03-21 | Disposition: A | Discharge status: Home / Self Care | Payer: Medicare Other | Attending: Emergency Medicine | Admitting: Emergency Medicine

## 2014-03-20 DIAGNOSIS — M129 Arthropathy, unspecified: Secondary | ICD-10-CM | POA: Insufficient documentation

## 2014-03-20 DIAGNOSIS — G459 Transient cerebral ischemic attack, unspecified: Secondary | ICD-10-CM | POA: Insufficient documentation

## 2014-03-20 DIAGNOSIS — F0390 Unspecified dementia without behavioral disturbance: Secondary | ICD-10-CM | POA: Insufficient documentation

## 2014-03-20 DIAGNOSIS — I10 Essential (primary) hypertension: Secondary | ICD-10-CM | POA: Insufficient documentation

## 2014-03-20 DIAGNOSIS — J45909 Unspecified asthma, uncomplicated: Secondary | ICD-10-CM | POA: Insufficient documentation

## 2014-03-20 DIAGNOSIS — IMO0002 Reserved for concepts with insufficient information to code with codable children: Secondary | ICD-10-CM | POA: Insufficient documentation

## 2014-03-20 DIAGNOSIS — K219 Gastro-esophageal reflux disease without esophagitis: Secondary | ICD-10-CM | POA: Insufficient documentation

## 2014-03-20 DIAGNOSIS — Z860100 Personal history of colon polyps, unspecified: Secondary | ICD-10-CM | POA: Insufficient documentation

## 2014-03-20 DIAGNOSIS — Z853 Personal history of malignant neoplasm of breast: Secondary | ICD-10-CM | POA: Insufficient documentation

## 2014-03-20 DIAGNOSIS — Z87442 Personal history of urinary calculi: Secondary | ICD-10-CM | POA: Insufficient documentation

## 2014-03-20 DIAGNOSIS — Z85118 Personal history of other malignant neoplasm of bronchus and lung: Secondary | ICD-10-CM | POA: Insufficient documentation

## 2014-03-20 DIAGNOSIS — Z880 Allergy status to penicillin: Secondary | ICD-10-CM | POA: Insufficient documentation

## 2014-03-20 DIAGNOSIS — Z87891 Personal history of nicotine dependence: Secondary | ICD-10-CM | POA: Insufficient documentation

## 2014-03-20 DIAGNOSIS — I5032 Chronic diastolic (congestive) heart failure: Secondary | ICD-10-CM | POA: Insufficient documentation

## 2014-03-20 DIAGNOSIS — E039 Hypothyroidism, unspecified: Secondary | ICD-10-CM | POA: Insufficient documentation

## 2014-03-20 DIAGNOSIS — Z86718 Personal history of other venous thrombosis and embolism: Secondary | ICD-10-CM | POA: Insufficient documentation

## 2014-03-20 DIAGNOSIS — Z79899 Other long term (current) drug therapy: Secondary | ICD-10-CM | POA: Insufficient documentation

## 2014-03-20 DIAGNOSIS — I2789 Other specified pulmonary heart diseases: Secondary | ICD-10-CM | POA: Insufficient documentation

## 2014-03-20 LAB — CBC WITH DIFFERENTIAL/PLATELET
BASOS ABS: 0 10*3/uL (ref 0.0–0.1)
Basophils Relative: 1 % (ref 0–1)
Eosinophils Absolute: 0.2 10*3/uL (ref 0.0–0.7)
Eosinophils Relative: 4 % (ref 0–5)
HCT: 40.2 % (ref 36.0–46.0)
Hemoglobin: 12.8 g/dL (ref 12.0–15.0)
LYMPHS ABS: 1.6 10*3/uL (ref 0.7–4.0)
LYMPHS PCT: 33 % (ref 12–46)
MCH: 28.4 pg (ref 26.0–34.0)
MCHC: 31.8 g/dL (ref 30.0–36.0)
MCV: 89.3 fL (ref 78.0–100.0)
Monocytes Absolute: 0.5 10*3/uL (ref 0.1–1.0)
Monocytes Relative: 11 % (ref 3–12)
NEUTROS ABS: 2.4 10*3/uL (ref 1.7–7.7)
Neutrophils Relative %: 51 % (ref 43–77)
PLATELETS: 249 10*3/uL (ref 150–400)
RBC: 4.5 MIL/uL (ref 3.87–5.11)
RDW: 14.3 % (ref 11.5–15.5)
WBC: 4.7 10*3/uL (ref 4.0–10.5)

## 2014-03-20 MED ORDER — FENTANYL CITRATE 0.05 MG/ML IJ SOLN
50.0000 ug | Freq: Once | INTRAMUSCULAR | Status: AC
  Administered 2014-03-21: 50 ug via INTRAVENOUS
  Filled 2014-03-20: qty 2

## 2014-03-20 NOTE — ED Provider Notes (Signed)
  Physical Exam  BP 134/42  Pulse 72  Temp(Src) 98.2 F (36.8 C) (Oral)  Resp 14  SpO2 94%  Physical Exam  ED Course  Procedures  MDM  Pt comes in with cc of facial droop, may be some subtle changes in speech. Hx of HTN, HL, DVT. Recent admission for fall.  Dr. Karle Starch has ordered CT head and basic labs. Exam - no neuro deficits.  I will reassess her post results and decide on disposition  1:26 AM CT is neg. Pt is asymptomatic. She will see her Neurologist soon. Return to the ER if symptoms return. Pt and daughter agreeable with the plan.  Varney Biles, MD 03/21/14 0127

## 2014-03-20 NOTE — ED Notes (Signed)
Per EMS pts family witnessed a facial/eye droop on left side that lasted until 8pm, pts fingers were cyanotic at tips and her cognition was declined. EMS states upon their arrival she was symptom free. Family states she had a fall 3 weeks ago bringing her to [facility] where they found a brain bleed behind her ear. Has Hx of afib and currently has a DVT in her groin but is not on coumadin due to the bleed. Pt seems slightly disoriented at this time

## 2014-03-20 NOTE — ED Notes (Signed)
Patient transported to CT 

## 2014-03-21 ENCOUNTER — Other Ambulatory Visit: Payer: Self-pay | Admitting: Family Medicine

## 2014-03-21 ENCOUNTER — Encounter: Payer: Self-pay | Admitting: Emergency Medicine

## 2014-03-21 ENCOUNTER — Ambulatory Visit: Payer: Medicare Other | Admitting: Emergency Medicine

## 2014-03-21 VITALS — BP 140/76 | HR 71 | Ht 63.0 in | Wt 181.0 lb

## 2014-03-21 DIAGNOSIS — R0600 Dyspnea, unspecified: Secondary | ICD-10-CM

## 2014-03-21 DIAGNOSIS — I2699 Other pulmonary embolism without acute cor pulmonale: Secondary | ICD-10-CM

## 2014-03-21 LAB — COMPREHENSIVE METABOLIC PANEL
ALT: 11 U/L (ref 0–35)
AST: 24 U/L (ref 0–37)
Albumin: 3.4 g/dL — ABNORMAL LOW (ref 3.5–5.2)
Alkaline Phosphatase: 82 U/L (ref 39–117)
BILIRUBIN TOTAL: 0.5 mg/dL (ref 0.3–1.2)
BUN: 27 mg/dL — ABNORMAL HIGH (ref 6–23)
CALCIUM: 9.1 mg/dL (ref 8.4–10.5)
CO2: 28 meq/L (ref 19–32)
Chloride: 103 mEq/L (ref 96–112)
Creatinine, Ser: 1.33 mg/dL — ABNORMAL HIGH (ref 0.50–1.10)
GFR calc Af Amer: 44 mL/min — ABNORMAL LOW (ref >90)
GFR calc non Af Amer: 38 mL/min — ABNORMAL LOW (ref >90)
Glucose, Bld: 109 mg/dL — ABNORMAL HIGH (ref 70–99)
POTASSIUM: 4.2 meq/L (ref 3.7–5.3)
SODIUM: 144 meq/L (ref 137–147)
Total Protein: 7 g/dL (ref 6.0–8.3)

## 2014-03-21 NOTE — Patient Instructions (Signed)
Walking oximetry today We will reconsider starting blood thinning medication in several months. We will likely repeat your CT scan of the head at that time to help [place] decide.  Follow with Dr Lamonte Sakai in 4 months or sooner if you have any problems.

## 2014-03-21 NOTE — Addendum Note (Signed)
Addended by: Carlos American A on: 03/21/2014 11:10 AM   Modules accepted: Orders

## 2014-03-21 NOTE — Discharge Instructions (Signed)

## 2014-03-21 NOTE — ED Notes (Signed)
Pt has cognitive deficit so daughter reviewed discharge instructions at bedside and acknowledged understanding of instructions. Pt is content with care and is stable upon discharge.

## 2014-03-21 NOTE — ED Notes (Signed)
MD at bedside. 

## 2014-03-21 NOTE — Assessment & Plan Note (Signed)
Not currently on anti-coag due to [facility]. The CT scan head done 4/22/ does not show residual blood > she may be a candidate for anticoag in a few months. We will have to take into account her sx, risks/benefits at that time as well as her overall functional status in setting some dementia.

## 2014-03-21 NOTE — Assessment & Plan Note (Signed)
Her PFT are reassuring. I believe her dyspnea relates largely to her PE superimposed on her cardiac fxn (which is much better compensated now). In absence of ability to start anticoag the best thing we can do right now is insure that she is adequately oxygenated.  - walking oximetry today

## 2014-03-21 NOTE — Progress Notes (Signed)
Subjective:    Patient ID: Alexa [name], female    DOB: [birth year], 78 y.o.   MRN: [id number]  \ Shortness of Breath Associated symptoms include leg swelling. Pertinent negatives include no ear pain, fever, headaches, rash, rhinorrhea, sore throat, vomiting or wheezing.   78 yo woman, former smoker, remote breast CA, s/p RUL lobectomy for 1B adenoCA 6/'14, HTN with diastolic dysfxn. She has had her diuretics adjusted to optimize volume status. Her PFT in 6/'14 (pre-op) did not show significant obstruction. She is not on BD's. She describes exertional SOB, somewhat better since her cardiac meds have been adjusted and she has been recovering from surgery. No cough, no wheeze.   ROV 03/21/14 -- follows up for multifactorial dyspnea. She unfortunately sustained a large PE, NSTEMI, SAH on recent admission 3/30-4/6. She was not anticoagulated and an IVC filter was placed. Repeat PFT 01/10/14 showed no significant obstruction beyond some curve to the flow volume loop. She was in the ED 4/22 with dyspnea. Her family had seen cyanosis on her fingers.    Review of Systems  Constitutional: Negative for fever and unexpected weight change.  HENT: Negative for congestion, dental problem, ear pain, nosebleeds, postnasal drip, rhinorrhea, sinus pressure, sneezing, sore throat and trouble swallowing.   Eyes: Negative for redness and itching.  Respiratory: Positive for shortness of breath. Negative for cough, chest tightness and wheezing.   Cardiovascular: Positive for leg swelling. Negative for palpitations.  Gastrointestinal: Negative for nausea and vomiting.  Genitourinary: Negative for dysuria.  Musculoskeletal: Negative for joint swelling.  Skin: Negative for rash.  Neurological: Negative for headaches.  Hematological: Bruises/bleeds easily.  Psychiatric/Behavioral: Negative for dysphoric mood. The patient is not nervous/anxious.    Past Medical History  Diagnosis Date  . Hyperlipidemia     a.  Myalgias with multiple statins.  . Hypertension   . Adenocarcinoma of breast     a. s/p right simple and left modified radical mastectomy with chemotherapy and radiation in 2002.   Marland Kitchen GERD (gastroesophageal reflux disease)   . Diverticulosis   . Hx of adenomatous colonic polyps   . DVT (deep venous thrombosis) 09-30-11    a. Right lower leg 09/2011.  . Arthritis   . Hypothyroidism   . Dementia     a. sees Dr. Brett Fairy. b. on Aricept.  Marland Kitchen History of kidney stones   . DDD (degenerative disc disease)     a. cervical and lumbar herniation and DJD s/p multiple diskectomies and other back surgeries from 2002-2008   . Asthma   . H/O vein stripping   . History of tobacco abuse     a. quit 25+ years ago. PFTs in 2/15 were relatively normal.   . Chronic diastolic CHF (congestive heart failure)     a. ETT-myoview (9/12) with 4' exercise, EF 68%, no ischemia or infarction. b. Echo (10/14) with EF 55-60%, mod diast dysfunction, normal RV size/fcn, mod pulm HTN.   . Venous insufficiency     a. H/o venous stasis ulceration.   Marland Kitchen PAF (paroxysmal atrial fibrillation)     a. Only episode that noted was in setting of right upper lobectomy in 2014. She was briefly on amiodarone but stopped it on her own. She is not coumadin. If recurrent AF, will need to consider.  . Non-small cell lung cancer     a. s/p right upper lobectomy in 2014.   . Pulmonary HTN     a. Moderate by echo 08/2013.  Marland Kitchen Cerebral infarct  a.  Remote anterior left frontal lobe infarct by CT head 01/2014.     Family History  Problem Relation Age of Onset  . Diabetes Daughter   . Diabetes Daughter   . Heart disease Father   . Heart disease Brother   . Heart disease Sister   . Colon cancer Neg Hx   . Colon polyps Daughter   . Stroke Mother      History   Social History  . Marital Status: Widowed    Spouse Name: N/A    Number of Children: 3  . Years of Education: 11   Occupational History  .     Social History Main  Topics  . Smoking status: Former Smoker -- 1.00 packs/day for 25 years    Types: Cigarettes    Quit date: 05/10/1980  . Smokeless tobacco: Never Used  . Alcohol Use: No  . Drug Use: No  . Sexual Activity: No   Other Topics Concern  . Not on file   Social History Narrative   Patient is widowed.   Patient is right-handed.   Patient is retired.   Patient has an 11th grade education.   Patient has three daughters.     Allergies  Allergen Reactions  . Simvastatin Other (See Comments)    myalgia  . Statins Other (See Comments)    Myalgias with multiple statins   . Strawberry Swelling    Swelling ,rash  . Hydrocodone-Homatropine Nausea Only  . Nsaids Other (See Comments)    unknown  . Penicillins Rash     Outpatient Prescriptions Prior to Visit  Medication Sig Dispense Refill  . donepezil (ARICEPT) 10 MG tablet Take 1 tablet (10 mg total) by mouth at bedtime.  90 tablet  0  . esomeprazole (NEXIUM) 40 MG packet Take 40 mg by mouth daily before breakfast.  30 each  6  . furosemide (LASIX) 40 MG tablet Take 0.5 tablets (20 mg total) by mouth 2 (two) times daily.  45 tablet  4  . levothyroxine (SYNTHROID, LEVOTHROID) 137 MCG tablet Take 137 mcg by mouth daily before breakfast.      . morphine (MSIR) 30 MG tablet Take 30 mg by mouth 2 (two) times daily.       Marland Kitchen oxyCODONE-acetaminophen (PERCOCET) 10-325 MG per tablet Take 1 tablet by mouth every 4 (four) hours as needed (pain).      . potassium chloride SA (K-DUR,KLOR-CON) 20 MEQ tablet Take 20 mEq by mouth daily.      . temazepam (RESTORIL) 30 MG capsule Take 1 capsule (30 mg total) by mouth at bedtime as needed for sleep.  30 capsule  5  . terconazole (TERAZOL 3) 0.8 % vaginal cream Place 1 applicator vaginally at bedtime.  20 g  5  . valsartan (DIOVAN) 320 MG tablet Take 1 tablet (320 mg total) by mouth daily.  30 tablet  6   No facility-administered medications prior to visit.         Objective:   Physical Exam Filed  Vitals:   03/21/14 1012  BP: 140/76  Pulse: 71  Height: 5\' 3"  (1.6 m)  Weight: 181 lb (82.101 kg)  SpO2: 96%   Gen: Pleasant, well-nourished, in no distress,  normal affect  ENT: No lesions,  mouth clear,  oropharynx clear, no postnasal drip  Neck: No JVD, no TMG, no carotid bruits  Lungs: No use of accessory muscles, no dullness to percussion, clear without rales or rhonchi  Cardiovascular: RRR, heart sounds  normal, no murmur or gallops, no LE edema  Musculoskeletal: No deformities, no cyanosis or clubbing  Neuro: alert, non focal  Skin: Warm, no lesions or rashes      Assessment & Plan:  Acute pulmonary embolism Not currently on anti-coag due to [facility]. The CT scan head done 4/22/ does not show residual blood > she may be a candidate for anticoag in a few months. We will have to take into account her sx, risks/benefits at that time as well as her overall functional status in setting some dementia.   Dyspnea Her PFT are reassuring. I believe her dyspnea relates largely to her PE superimposed on her cardiac fxn (which is much better compensated now). In absence of ability to start anticoag the best thing we can do right now is insure that she is adequately oxygenated.  - walking oximetry today

## 2014-03-22 ENCOUNTER — Other Ambulatory Visit: Payer: Self-pay

## 2014-03-22 DIAGNOSIS — F0150 Vascular dementia without behavioral disturbance: Secondary | ICD-10-CM

## 2014-03-22 MED ORDER — DONEPEZIL HCL 10 MG PO TABS: 10.0000 mg | ORAL_TABLET | Freq: Every day | ORAL | Status: DC

## 2014-03-22 NOTE — Telephone Encounter (Signed)
Can we send this in? Do we need to check thyroid level?

## 2014-03-25 NOTE — ED Provider Notes (Signed)
CSN: [id number]     Arrival date & time 03/20/14  2141 History   First MD Initiated Contact with Patient 03/20/14 2151     Chief Complaint  Patient presents with  . Facial Droop     (Consider location/radiation/quality/duration/timing/severity/associated sxs/prior Treatment) HPI Level 5 caveat due to confusion/dementia Pt brought to the ED via EMS, family report she had a 'droopy eye' from about 6pm-8pm. She seemed to be more confused from baseline then. She also had blue fingertips although this is not unusual for her. She was back to baseline by the time EMS arrived. She had a fall recently, found to have small SDH, so not a candidate for tPA.   Past Medical History  Diagnosis Date  . Hyperlipidemia     a. Myalgias with multiple statins.  . Hypertension   . Adenocarcinoma of breast     a. s/p right simple and left modified radical mastectomy with chemotherapy and radiation in 2002.   Marland Kitchen GERD (gastroesophageal reflux disease)   . Diverticulosis   . Hx of adenomatous colonic polyps   . DVT (deep venous thrombosis) 09-30-11    a. Right lower leg 09/2011.  . Arthritis   . Hypothyroidism   . Dementia     a. sees Dr. Brett Fairy. b. on Aricept.  Marland Kitchen History of kidney stones   . DDD (degenerative disc disease)     a. cervical and lumbar herniation and DJD s/p multiple diskectomies and other back surgeries from 2002-2008   . Asthma   . H/O vein stripping   . History of tobacco abuse     a. quit 25+ years ago. PFTs in 2/15 were relatively normal.   . Chronic diastolic CHF (congestive heart failure)     a. ETT-myoview (9/12) with 4' exercise, EF 68%, no ischemia or infarction. b. Echo (10/14) with EF 55-60%, mod diast dysfunction, normal RV size/fcn, mod pulm HTN.   . Venous insufficiency     a. H/o venous stasis ulceration.   Marland Kitchen PAF (paroxysmal atrial fibrillation)     a. Only episode that noted was in setting of right upper lobectomy in 2014. She was briefly on amiodarone but stopped it on  her own. She is not coumadin. If recurrent AF, will need to consider.  . Non-small cell lung cancer     a. s/p right upper lobectomy in 2014.   . Pulmonary HTN     a. Moderate by echo 08/2013.  Marland Kitchen Cerebral infarct     a.  Remote anterior left frontal lobe infarct by CT head 01/2014.   Past Surgical History  Procedure Laterality Date  . Cholecystectomy  2007  . Breast surgery  2002    BL mastectomy  . Lumbar laminectomy      x3  . Rotator cuff repair Right 10  . Cervical spine surgery    . Hip surgery    . Spine surgery    . Abdominal hysterectomy    . Fiberoptic bronchoscopy with electromagnetic  10/07/2010  . Video bronchoscopy N/A 05/14/2013    Procedure: VIDEO BRONCHOSCOPY;  Surgeon: Grace Isaac, MD;  Location: [facility] OR;  Service: Thoracic;  Laterality: N/A;  . Video assisted thoracoscopy (vats)/ lobectomy Right 05/14/2013    Procedure: VIDEO ASSISTED THORACOSCOPY (VATS)/ LOBECTOMY;  Surgeon: Grace Isaac, MD;  Location: [facility];  Service: Thoracic;  Laterality: Right;  . Colonoscopy  02-18-09    per Dr. Fuller Plan, adenomatous polyp, repeat in 5 yrs   . Neck-acdf    .  Varicose vein surgery    . Hemorrhoid surgery  1985  . Kidney stone surgery  1987   Family History  Problem Relation Age of Onset  . Diabetes Daughter   . Diabetes Daughter   . Heart disease Father   . Heart disease Brother   . Heart disease Sister   . Colon cancer Neg Hx   . Colon polyps Daughter   . Stroke Mother    History  Substance Use Topics  . Smoking status: Former Smoker -- 1.00 packs/day for 25 years    Types: Cigarettes    Quit date: 05/10/1980  . Smokeless tobacco: Never Used  . Alcohol Use: No   OB History   Grav Para Term Preterm Abortions TAB SAB Ect Mult Living                 Review of Systems Unable to assess due to mental status.     Allergies  Simvastatin; Statins; Strawberry; Hydrocodone-homatropine; Nsaids; and Penicillins  Home Medications   Prior to Admission  medications   Medication Sig Start Date End Date Taking? Authorizing Provider  esomeprazole (NEXIUM) 40 MG packet Take 40 mg by mouth daily before breakfast. 09/27/13  Yes Burtis Junes, NP  furosemide (LASIX) 40 MG tablet Take 0.5 tablets (20 mg total) by mouth 2 (two) times daily. 03/04/14  Yes Adeline Saralyn Pilar, MD  levothyroxine (SYNTHROID, LEVOTHROID) 137 MCG tablet Take 137 mcg by mouth daily before breakfast.   Yes Historical Provider, MD  morphine (MSIR) 30 MG tablet Take 30 mg by mouth 2 (two) times daily.    Yes Historical Provider, MD  oxyCODONE-acetaminophen (PERCOCET) 10-325 MG per tablet Take 1 tablet by mouth every 4 (four) hours as needed (pain). 03/04/14  Yes Adeline C Viyuoh, MD  potassium chloride SA (K-DUR,KLOR-CON) 20 MEQ tablet Take 20 mEq by mouth daily. 02/13/14  Yes Larey Dresser, MD  temazepam (RESTORIL) 30 MG capsule Take 1 capsule (30 mg total) by mouth at bedtime as needed for sleep. 03/12/14  Yes Laurey Morale, MD  terconazole (TERAZOL 3) 0.8 % vaginal cream Place 1 applicator vaginally at bedtime. 03/12/14  Yes Laurey Morale, MD  valsartan (DIOVAN) 320 MG tablet Take 1 tablet (320 mg total) by mouth daily. 09/26/13  Yes Burtis Junes, NP  donepezil (ARICEPT) 10 MG tablet Take 1 tablet (10 mg total) by mouth at bedtime. 03/22/14   Asencion Partridge Dohmeier, MD  levothyroxine (SYNTHROID, LEVOTHROID) 137 MCG tablet TAKE 1 TABLET (137 MCG TOTAL) BY MOUTH DAILY.    Laurey Morale, MD   BP 143/49  Pulse 89  Temp(Src) 98.2 F (36.8 C) (Oral)  Resp 17  SpO2 92% Physical Exam  Nursing note and vitals reviewed. Constitutional: She appears well-developed and well-nourished.  HENT:  Head: Normocephalic and atraumatic.  Eyes: EOM are normal. Pupils are equal, round, and reactive to light.  Neck: Normal range of motion. Neck supple.  Cardiovascular: Normal rate, normal heart sounds and intact distal pulses.   Pulmonary/Chest: Effort normal and breath sounds normal.  Abdominal: Bowel  sounds are normal. She exhibits no distension. There is no tenderness.  Musculoskeletal: Normal range of motion. She exhibits no edema and no tenderness.  Fingertip cyanosis  Neurological: She is alert. She has normal strength. No cranial nerve deficit or sensory deficit.  Skin: Skin is warm and dry. No rash noted.  Psychiatric: She has a normal mood and affect.    ED Course  Procedures (including critical care  time) Labs Review Labs Reviewed  COMPREHENSIVE METABOLIC PANEL - Abnormal; Notable for the following:    Glucose, Bld 109 (*)    BUN 27 (*)    Creatinine, Ser 1.33 (*)    Albumin 3.4 (*)    GFR calc non Af Amer 38 (*)    GFR calc Af Amer 44 (*)    All other components within normal limits  CBC WITH DIFFERENTIAL    Imaging Review No results found.   EKG Interpretation   Date/Time:  Wednesday March 20 2014 21:50:20 EDT Ventricular Rate:  75 PR Interval:  154 QRS Duration: 86 QT Interval:  457 QTC Calculation: 510 R Axis:   10 Text Interpretation:  Sinus rhythm Artifact Borderline T abnormalities,  inferior leads Prolonged QT interval Since last tracing T wave inversion  in V2 is more pronounced Confirmed by [facility]  MD, Juanda Crumble [phone]) on  03/20/2014 10:19:16 PM      MDM   Final diagnoses:  TIA (transient ischemic attack)    Pt with atypical neuro symptoms, not a candidate for tPA. Plan d/c if CT head neg. Care signed out to Dr. Kathrynn Humble at shift change. Family amenable.     Mukund Weinreb B. Karle Starch, MD 03/25/14 1537

## 2014-03-27 ENCOUNTER — Encounter: Payer: Self-pay | Admitting: Nurse Practitioner

## 2014-03-27 ENCOUNTER — Ambulatory Visit (INDEPENDENT_AMBULATORY_CARE_PROVIDER_SITE_OTHER): Payer: Medicare Other | Admitting: Nurse Practitioner

## 2014-03-27 VITALS — BP 139/66 | HR 65 | Wt 179.0 lb

## 2014-03-27 DIAGNOSIS — F0150 Vascular dementia without behavioral disturbance: Secondary | ICD-10-CM

## 2014-03-27 DIAGNOSIS — F0280 Dementia in other diseases classified elsewhere without behavioral disturbance: Secondary | ICD-10-CM

## 2014-03-27 DIAGNOSIS — G3109 Other frontotemporal dementia: Secondary | ICD-10-CM

## 2014-03-27 NOTE — Progress Notes (Signed)
PATIENT: Alexa [name] DOB: Oct 02, 1936  REASON FOR VISIT: follow up for dementia HISTORY FROM: patient  HISTORY OF PRESENT ILLNESS: Alexa [name] is a 78 y.o. female Is seen here as a referral/ revisit from Dr. Sarajane Jews, her PCP, for progressive memory decline.   UPDATE 03/27/14 (LL): Since last visit, She unfortunately sustained a large PE, NSTEMI, SAH on admission 3/30-4/6. She was not anticoagulated and an IVC filter was placed. She was taken to the ER on 4/22 last week, with left-sided facial droop which resolved quickly. Head CT showed No acute intracranial abnormalities. Chronic atrophy and small  vessel ischemic changes. 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  She has become more tearful, poor appetite, but no balance problems.  No reported problems with bowel or bladder.  She continues to live in her own home and her daughters are taking turns staying with her.  The daughter that lives with her continues to be in very poor health awaiting liver transplant.  There has been large change in personality, not really talking unless spoken to, and very labile emotions. MMSE today is 9/30, AFT 0.  Previous HPI (CD): Alexa [name] was last seen by Dr. Laurance Flatten on 09-06-12 and her previous visit with me was on 09-21-11 the patient is again today here accompanied by her daughters were concerned about the last months when her memory loss seems to have significantly progressed and declined. In 2012 and 2013 She noted difficulties with naming objects and remembering names mainly.  She does not was not anxious or depressed at all and she appears to be in a fairly normal mood today.  The patient has a history of breast cancer with a double mastectomy for more than 6 years ago and in 2014 last year to this office was diagnosed with a non-small cell cancer of the right lung which has been surgically removed and did not require chemotherapy or radiation. But she has done physically all right except for joint pain  and aches and pains there has been cognitively a lot of change.  Please note that in August 2013 an MRI of the brain had short shoulder and a subcortical small vessel disease and an enhancing lesion in the right frontal region. It was interpreted as a right frontal developmental venous normality.  Alexa [name] has become absent minded, and often seems to stare into space. She has some twitching , and dropped objects. She is sleeping a lot more than last year, falls asleep immediately when not physically active or mentally stimulated. Her children state she " always smiles' and is in good spirits.  Her sleep is "good" and her apetite is good, according to the patient. She is cared for by her daughters. Significant ankle edema with dystrophic skin is noted.  Memory loss, MMSE now 18 points.  Ankle edema the signal significant skin changes dystrophic skin changes induration of the skin, puffiness up to the knee level she had suffered a blood clot in 2012 in her left knee and she had a poorly healing wound.  There has been no significant ataxia no altered nor orthostatic dizziness or lightheadedness with postural changes.  The daughters have noted that she used to have a variability of cognitive function day by day -which changed about 2 months ago. Now , she is seems to have a more decline in no longer the good days.  Stress is present as her daughter is awaiting a liver transplant. The patient is a main caretaker !  REVIEW OF SYSTEMS: Full 14 system review of systems performed and notable only for:  Activity change, appetite change, chills, hearing loss, runny nose, shortness of breath, leg swelling, daytime sleepiness, joint pain, back pain, aching muscles, walking difficuty, bruise easily, memory loss, headache, weakness, confusion, nervous/anxious   ALLERGIES: Allergies  Allergen Reactions  . Simvastatin Other (See Comments)    myalgia  . Statins Other (See Comments)    Myalgias with multiple  statins   . Strawberry Swelling    Swelling ,rash  . Hydrocodone-Homatropine Nausea Only  . Nsaids Other (See Comments)    unknown  . Penicillins Rash    HOME MEDICATIONS: Outpatient Prescriptions Prior to Visit  Medication Sig Dispense Refill  . donepezil (ARICEPT) 10 MG tablet Take 1 tablet (10 mg total) by mouth at bedtime.  90 tablet  0  . esomeprazole (NEXIUM) 40 MG packet Take 40 mg by mouth daily before breakfast.  30 each  6  . furosemide (LASIX) 40 MG tablet Take 0.5 tablets (20 mg total) by mouth 2 (two) times daily.  45 tablet  4  . levothyroxine (SYNTHROID, LEVOTHROID) 137 MCG tablet Take 137 mcg by mouth daily before breakfast.      . levothyroxine (SYNTHROID, LEVOTHROID) 137 MCG tablet TAKE 1 TABLET (137 MCG TOTAL) BY MOUTH DAILY.  30 tablet  11  . morphine (MSIR) 30 MG tablet Take 30 mg by mouth 2 (two) times daily.       Marland Kitchen oxyCODONE-acetaminophen (PERCOCET) 10-325 MG per tablet Take 1 tablet by mouth every 4 (four) hours as needed (pain).      . potassium chloride SA (K-DUR,KLOR-CON) 20 MEQ tablet Take 20 mEq by mouth daily.      . temazepam (RESTORIL) 30 MG capsule Take 1 capsule (30 mg total) by mouth at bedtime as needed for sleep.  30 capsule  5  . terconazole (TERAZOL 3) 0.8 % vaginal cream Place 1 applicator vaginally at bedtime.  20 g  5  . valsartan (DIOVAN) 320 MG tablet Take 1 tablet (320 mg total) by mouth daily.  30 tablet  6   No facility-administered medications prior to visit.     PHYSICAL EXAM  Filed Vitals:   03/27/14 1044  BP: 139/66  Pulse: 65  Weight: 179 lb (81.194 kg)   Body mass index is 31.72 kg/(m^2).  Generalized: Well developed, in no acute distress. She is friendly and cooperative , but significantly impaired by cognitive decline.  Head: normocephalic and atraumatic. Oropharynx benign  Neck: Supple, no carotid bruits  Cardiac: Regular rate rhythm, no murmur  Musculoskeletal: No deformity   Neurological examination  Memory  subjective described as impaired- her MMSE is down to 9 points, last visit was 18. There is a limited attention span & concentration ability. Speech is fluent without dysarthria, dysphonia or aphasia but she perseverate.  Mood and affect are labile, tearful.  Cranial nerve II-XII: Pupils were equal round reactive to light extraocular movements were full, visual field were full on confrontational test. Facial sensation and strength were normal. hearing was intact to finger rubbing bilaterally.  Motor: The motor testing reveals 5 over 5 strength of all 4 extremities. Good symmetric motor tone is noted throughout.  Sensory: Sensory testing is intact to soft touch Coordination: Rapid alternating movements in the fingers/hands is tested and normal. Finger-to-nose maneuver tested and normal with evidence of right to left confusion, finger anosognosia and Tremor, There is gegenhalten.  Gait and station: Patient walks without assistive device. Strength  within normal limits. Stance is wide based .  Reflexes: Deep tendon reflexes are symmetric and normal bilaterally.  ASSESSMENT AND PLAN Progressive rapid cognitive decline, but in the frame of decreased memory and word finding capacity in addition there is some right to left confusion, some apraxia and some nausea. 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 The patient fell and had a CT head in the ED, [facility], which showed encephalomalacia and small SAH. No MRI is necessary now. More rapid decline concerning for a frontotemporal dementia.  Recommend Home Health RN be sent to home to assess living situation and needs. She now needs 24 hour supervision.  Family was updated on expectations today with Dr. Brett Fairy, I am afraid that patient has less than 1 year to live and hospice may soon be an option.  Orders Placed This Encounter  Procedures  .  Ambulatory referral to Home Health  . Home Health  . Face-to-face encounter (required for Medicare/Medicaid patients)   Return in about 6 months (around 09/26/2014), or if symptoms worsen or fail to improve.  Philmore Pali, MSN, NP-C 03/28/2014, 8:38 AM [facility] [address], [place],  [address] [phone]  Note: This document was prepared with digital dictation and possible smart phrase technology. Any transcriptional errors that result from this process are unintentional.

## 2014-03-27 NOTE — Patient Instructions (Signed)
We are ordering a Home Health Evaluation by a nurse to come and evaluate home needs.  They should contact you within the next 2 weeks.

## 2014-03-28 NOTE — Progress Notes (Signed)
I agree with the assessment and plan as directed by NP .The patient is known to me .   Marin Milley, MD  

## 2014-04-02 ENCOUNTER — Ambulatory Visit: Payer: Medicare Other | Admitting: Oncology

## 2014-04-02 ENCOUNTER — Other Ambulatory Visit: Payer: Medicare Other

## 2014-04-10 ENCOUNTER — Ambulatory Visit: Payer: Medicare Other | Admitting: Nurse Practitioner

## 2014-04-10 ENCOUNTER — Telehealth: Payer: Self-pay | Admitting: Neurology

## 2014-04-10 NOTE — Telephone Encounter (Signed)
Yes that is fine, please enter order. May try Namenda if they like.

## 2014-04-10 NOTE — Telephone Encounter (Signed)
Spoke with the RN from [facility] who asked for a verbal order for social work.  She said the daughters are very supportive but would like to know what type of community resources are available.  She also had a request from the daughters to see if [facility] would be helpful with the mothers demenia.  Please advise.

## 2014-04-12 NOTE — Telephone Encounter (Signed)
Spoke to [place], Therapist, sports with [facility] and relayed verbal order for social work.  She will let the daughters know that they can get Rx for Namenda if they want to try it.

## 2014-04-18 DIAGNOSIS — F0150 Vascular dementia without behavioral disturbance: Secondary | ICD-10-CM

## 2014-05-09 ENCOUNTER — Ambulatory Visit (INDEPENDENT_AMBULATORY_CARE_PROVIDER_SITE_OTHER): Payer: Medicare Other | Admitting: Family Medicine

## 2014-05-09 ENCOUNTER — Encounter: Payer: Self-pay | Admitting: Family Medicine

## 2014-05-09 VITALS — BP 145/67 | HR 69 | Temp 97.8°F | Ht 63.0 in | Wt 168.0 lb

## 2014-05-09 DIAGNOSIS — I5032 Chronic diastolic (congestive) heart failure: Secondary | ICD-10-CM

## 2014-05-09 DIAGNOSIS — I509 Heart failure, unspecified: Secondary | ICD-10-CM

## 2014-05-09 DIAGNOSIS — R5383 Other fatigue: Secondary | ICD-10-CM

## 2014-05-09 DIAGNOSIS — F0390 Unspecified dementia without behavioral disturbance: Secondary | ICD-10-CM

## 2014-05-09 DIAGNOSIS — E039 Hypothyroidism, unspecified: Secondary | ICD-10-CM

## 2014-05-09 DIAGNOSIS — R531 Weakness: Secondary | ICD-10-CM

## 2014-05-09 DIAGNOSIS — R5381 Other malaise: Secondary | ICD-10-CM

## 2014-05-09 DIAGNOSIS — D649 Anemia, unspecified: Secondary | ICD-10-CM

## 2014-05-09 DIAGNOSIS — G609 Hereditary and idiopathic neuropathy, unspecified: Secondary | ICD-10-CM

## 2014-05-09 DIAGNOSIS — I10 Essential (primary) hypertension: Secondary | ICD-10-CM

## 2014-05-09 LAB — POCT URINALYSIS DIPSTICK
Bilirubin, UA: NEGATIVE
Glucose, UA: NEGATIVE
KETONES UA: NEGATIVE
Nitrite, UA: NEGATIVE
PH UA: 5
Spec Grav, UA: 1.015
UROBILINOGEN UA: 0.2

## 2014-05-09 LAB — HEPATIC FUNCTION PANEL
ALT: 15 U/L (ref 0–35)
AST: 19 U/L (ref 0–37)
Albumin: 3.8 g/dL (ref 3.5–5.2)
Alkaline Phosphatase: 82 U/L (ref 39–117)
Bilirubin, Direct: 0.1 mg/dL (ref 0.0–0.3)
TOTAL PROTEIN: 7.7 g/dL (ref 6.0–8.3)
Total Bilirubin: 1 mg/dL (ref 0.2–1.2)

## 2014-05-09 LAB — CBC WITH DIFFERENTIAL/PLATELET
BASOS ABS: 0 10*3/uL (ref 0.0–0.1)
Basophils Relative: 0.6 % (ref 0.0–3.0)
EOS ABS: 0.1 10*3/uL (ref 0.0–0.7)
Eosinophils Relative: 1.2 % (ref 0.0–5.0)
HEMATOCRIT: 37.9 % (ref 36.0–46.0)
HEMOGLOBIN: 12.8 g/dL (ref 12.0–15.0)
LYMPHS ABS: 1.9 10*3/uL (ref 0.7–4.0)
LYMPHS PCT: 32.7 % (ref 12.0–46.0)
MCHC: 33.7 g/dL (ref 30.0–36.0)
MCV: 85.8 fl (ref 78.0–100.0)
Monocytes Absolute: 0.5 10*3/uL (ref 0.1–1.0)
Monocytes Relative: 9.2 % (ref 3.0–12.0)
Neutro Abs: 3.3 10*3/uL (ref 1.4–7.7)
Neutrophils Relative %: 56.3 % (ref 43.0–77.0)
Platelets: 262 10*3/uL (ref 150.0–400.0)
RBC: 4.42 Mil/uL (ref 3.87–5.11)
RDW: 14.5 % (ref 11.5–15.5)
WBC: 5.8 10*3/uL (ref 4.0–10.5)

## 2014-05-09 LAB — BASIC METABOLIC PANEL
BUN: 22 mg/dL (ref 6–23)
CHLORIDE: 103 meq/L (ref 96–112)
CO2: 30 mEq/L (ref 19–32)
Calcium: 9.7 mg/dL (ref 8.4–10.5)
Creatinine, Ser: 1.3 mg/dL — ABNORMAL HIGH (ref 0.4–1.2)
GFR: 44.28 mL/min — ABNORMAL LOW (ref >60.00)
Glucose, Bld: 104 mg/dL — ABNORMAL HIGH (ref 70–99)
Potassium: 4.3 mEq/L (ref 3.5–5.1)
Sodium: 141 mEq/L (ref 135–145)

## 2014-05-09 LAB — TSH: TSH: 0.04 u[IU]/mL — ABNORMAL LOW (ref 0.35–4.50)

## 2014-05-09 LAB — VITAMIN B12: Vitamin B-12: 177 pg/mL — ABNORMAL LOW (ref 211–911)

## 2014-05-09 NOTE — Progress Notes (Signed)
   Subjective:    Patient ID: Alexa [name], female    DOB: 03/12/1936, 78 y.o.   MRN: [id number]  HPI Here with her daughter because her daughter is concerned about her constant fatigue and poor appetite. She has lost 20 lbs in the past 2 months. They ask to check some labs today to see if something is wrong. She has also complained about lower back pain and lower abdominal pain, and she has a hx of frequent UTIs. No fever.    Review of Systems  Constitutional: Positive for appetite change, fatigue and unexpected weight change. Negative for fever.  Respiratory: Negative.   Cardiovascular: Negative.   Gastrointestinal: Negative.        Objective:   Physical Exam  Constitutional: She appears well-developed and well-nourished. No distress.  Cardiovascular: Normal rate, regular rhythm, normal heart sounds and intact distal pulses.   Pulmonary/Chest: Effort normal and breath sounds normal.  Abdominal: Soft. Bowel sounds are normal. She exhibits no distension and no mass. There is no tenderness. There is no rebound and no guarding.          Assessment & Plan:  We will get labs today including a UA. She is encouraged to drink plenty of water and to consume 2-3 cans of Ensure daily.

## 2014-05-09 NOTE — Progress Notes (Signed)
Pre visit review using our clinic review tool, if applicable. No additional management support is needed unless otherwise documented below in the visit note. 

## 2014-05-10 ENCOUNTER — Ambulatory Visit (INDEPENDENT_AMBULATORY_CARE_PROVIDER_SITE_OTHER): Payer: Medicare Other | Admitting: Cardiology

## 2014-05-10 ENCOUNTER — Telehealth: Payer: Self-pay | Admitting: Family Medicine

## 2014-05-10 ENCOUNTER — Encounter: Payer: Self-pay | Admitting: Cardiology

## 2014-05-10 ENCOUNTER — Encounter: Payer: Self-pay | Admitting: *Deleted

## 2014-05-10 VITALS — BP 130/74 | HR 84 | Ht 62.75 in | Wt 169.8 lb

## 2014-05-10 DIAGNOSIS — I609 Nontraumatic subarachnoid hemorrhage, unspecified: Secondary | ICD-10-CM

## 2014-05-10 DIAGNOSIS — I2789 Other specified pulmonary heart diseases: Secondary | ICD-10-CM

## 2014-05-10 DIAGNOSIS — I2699 Other pulmonary embolism without acute cor pulmonale: Secondary | ICD-10-CM

## 2014-05-10 DIAGNOSIS — I5032 Chronic diastolic (congestive) heart failure: Secondary | ICD-10-CM

## 2014-05-10 DIAGNOSIS — Z8739 Personal history of other diseases of the musculoskeletal system and connective tissue: Secondary | ICD-10-CM

## 2014-05-10 DIAGNOSIS — I4891 Unspecified atrial fibrillation: Secondary | ICD-10-CM

## 2014-05-10 DIAGNOSIS — I509 Heart failure, unspecified: Secondary | ICD-10-CM

## 2014-05-10 DIAGNOSIS — I10 Essential (primary) hypertension: Secondary | ICD-10-CM

## 2014-05-10 LAB — T3, FREE: T3 FREE: 2.8 pg/mL (ref 2.3–4.2)

## 2014-05-10 LAB — T4, FREE: Free T4: 1.47 ng/dL (ref 0.60–1.60)

## 2014-05-10 LAB — TSH: TSH: 0.07 u[IU]/mL — ABNORMAL LOW (ref 0.35–4.50)

## 2014-05-10 MED ORDER — CIPROFLOXACIN HCL 500 MG PO TABS: 500.0000 mg | ORAL_TABLET | Freq: Two times a day (BID) | ORAL | Status: DC

## 2014-05-10 NOTE — Addendum Note (Signed)
Addended by: Aggie Hacker A on: 05/10/2014 02:38 PM   Modules accepted: Orders

## 2014-05-10 NOTE — Telephone Encounter (Signed)
Relevant patient education assigned to patient using Emmi. ° °

## 2014-05-10 NOTE — Patient Instructions (Addendum)
Your physician recommends that you return for lab work today--TSH/Free T3/Free T4  Your physician has requested that you have an echocardiogram. Echocardiography is a painless test that uses sound waves to create images of your heart. It provides your doctor with information about the size and shape of your heart and how well your heart's chambers and valves are working. This procedure takes approximately one hour. There are no restrictions for this procedure. End of  July 2015  Your physician wants you to follow-up in: 4 months with Dr Aundra Dubin. (October 2015).  You will receive a reminder letter in the mail two months in advance. If you don't receive a letter, please call our office to schedule the follow-up appointment.

## 2014-05-12 NOTE — Progress Notes (Signed)
Patient ID: Alexa [name], female   DOB: 03-29-1936, 78 y.o.   MRN: [id number]  Subjective:    Patient ID: Alexa [name], female    DOB: 01-14-1936, 78 y.o.   MRN: [id number] PCP: Dr. Sarajane Jews  78 yo with history of diastolic CHF, dementia, paroxysmal atrial fibrillation, DVT/PE, and lung cancer presents for cardiology followup.  She had paroxysmal atrial fibrillation in the setting of her right upper lobectomy in 2014.  She was on amiodarone afterwards but stopped it and is not sure why.    On 02/25/14, she was admitted with dyspnea and headache.  She was found to have a submassive PE with RV strain but also a traumatic subarachnoid hemorrhage from a fall.  TnI went up to 1.0.  It was decided not to anticoagulate her given the [facility].  Echo during this admission showed EF 55-60% with severely dilated RV and PASP 61 mmHg.  She received an IVC filter.   She seems to have recovered from the PE.  She does not get short of breath walking on flat ground.  No chest pain, lightheadedness, tachypalpitations, orthopnea/PND.  Weight is down about 20 lbs. Patient ambulated today with oxygen saturation 97-98% on room air.   Labs (9/12): K 4, creatinine 0.9, BNP 82, LDL 163, HDL 53, TGs 80 Labs (11/12): K 3.8, creatinine 0.91 Labs (12/14): K 3.8, creatinine 1.2 Labs (6/15): K 4.3, creatinine 1.3, TSH < 0.04, HCT 37.9  PMH: 1. hypertension 2. Hyperlipidemia: Myalgias with multiple statins.  3. hypothyroidism 4. GERD 5. history of tobacco abuse; quit 25+ years ago.  PFTs in 2/15 were relatively normal.  5. Breast adenocarcinoma s/p right simple and left modified radical mastectomy with chemotherapy and radiation in 2002.  6. cervical and lumbar herniation and DJD s/p multiple diskectomies and other back surgeries from 2002-2008 - chronic back pain being followed at the pain clinic 7. Diastolic CHF: Echo (8/34) with EF 60-65%, mild MR, moderate TR, PA systolic pressure 48 mmHg.  ETT-myoview (9/12) with 4'  exercise, EF 68%, no ischemia or infarction.  Echo (101/4) with EF 55-60%, moderate diastolic dysfunction, PA systolic pressure 19-62 mmHg, normal RV size and systolic function.  Echo (3/15) with EF 55-60%, severely dilated RV with PA systolic pressure 61 mmHg, moderate to severe TR, dilated IVC.  8. Venous insufficiency with venous stasis ulceration.  9. VTE: DVT right lower leg 11/12.  Submassive PE 3/15.   10. Paroxysmal atrial fibrillation: Only episode that noted was in setting of right upper lobectomy in 2014.  She was briefly on amiodarone but stopped it on her own.  She is not coumadin.  11. Non-small cell lung cancer: s/p right upper lobectomy in 2014.  12. Dementia: On Aricept.  FHx: Mother died of stroke at age 20. Father died of heart attack at age 41. Sister had breast cancer.  SH:  Prior smoker. Lives in [place] with family.   ROS: All systems reviewed and negative except as per HPI.   Current Outpatient Prescriptions  Medication Sig Dispense Refill  . donepezil (ARICEPT) 10 MG tablet Take 1 tablet (10 mg total) by mouth at bedtime.  90 tablet  0  . esomeprazole (NEXIUM) 40 MG packet Take 40 mg by mouth daily before breakfast.  30 each  6  . furosemide (LASIX) 40 MG tablet Take 20 mg by mouth 2 (two) times daily. Pt is now taking 40 mg in the morning (05/10/14)      . levothyroxine (SYNTHROID, LEVOTHROID) 137 MCG  tablet Take 137 mcg by mouth daily before breakfast.      . morphine (MSIR) 30 MG tablet Take 30 mg by mouth 2 (two) times daily.       Marland Kitchen oxyCODONE-acetaminophen (PERCOCET) 10-325 MG per tablet Take 1 tablet by mouth every 4 (four) hours as needed (pain).      . potassium chloride SA (K-DUR,KLOR-CON) 20 MEQ tablet Take 20 mEq by mouth daily.      . temazepam (RESTORIL) 30 MG capsule Take 1 capsule (30 mg total) by mouth at bedtime as needed for sleep.  30 capsule  5  . valsartan (DIOVAN) 320 MG tablet Take 1 tablet (320 mg total) by mouth daily.  30 tablet  6  .  ciprofloxacin (CIPRO) 500 MG tablet Take 1 tablet (500 mg total) by mouth 2 (two) times daily.  14 tablet  0   No current facility-administered medications for this visit.    BP 130/74  Pulse 84  Ht 5' 2.75" (1.594 m)  Wt 77.021 kg (169 lb 12.8 oz)  BMI 30.31 kg/m2  SpO2 99% General: NAD, overweight Neck: JVP 7-8 cm, no thyromegaly or thyroid nodule.  Lungs: Clear to auscultation bilaterally with normal respiratory effort. CV: Nondisplaced PMI.  Heart regular S1/S2, no S3/S4, no murmur.  No edema. Bilateral lower leg venous varicosities.  No carotid bruit.   Abdomen: Soft, nontender, no hepatosplenomegaly, no distention.  Neurologic: Alert and oriented x 3.  Psych: Anxious Extremities: No clubbing or cyanosis.   Assessment/Plan: 1. Exertional dyspnea: NYHA class II symptoms, relatively stable.  This is probably primarily due to recent PE.  Volume status looks ok.  - Continue current Lasix.   2. Atrial fibrillation: 1 documented episode post-op lobectomy.  No tachypalpitations.  She is no longer taking amiodarone.  3. HTN: BP controlled on valsartan.  4. PE: Recent PE was not triggered by anything in particular and was submassive.  She also fell and had a traumatic subarachnoid hemorrhage.  Therefore, she had an IVC filter placed.  - Will check with Dr. Lamonte Sakai regarding timing for resuming anticoagulation.  - Repeat echo in 7/15 to assess for RV improvement.  5. Thyroid: Patient has history of hypothyroidism with replacement.  TSH was suppressed on most recent labs.  Will repeat TSH with free T3 and free T4.    Loralie Champagne 05/12/2014

## 2014-05-30 ENCOUNTER — Other Ambulatory Visit: Payer: Self-pay | Admitting: *Deleted

## 2014-05-30 DIAGNOSIS — C3491 Malignant neoplasm of unspecified part of right bronchus or lung: Secondary | ICD-10-CM

## 2014-06-03 ENCOUNTER — Encounter: Payer: Self-pay | Admitting: Hematology & Oncology

## 2014-06-03 ENCOUNTER — Other Ambulatory Visit (HOSPITAL_BASED_OUTPATIENT_CLINIC_OR_DEPARTMENT_OTHER): Payer: Medicare Other | Admitting: Lab

## 2014-06-03 ENCOUNTER — Other Ambulatory Visit: Payer: Medicare Other

## 2014-06-03 ENCOUNTER — Ambulatory Visit (HOSPITAL_BASED_OUTPATIENT_CLINIC_OR_DEPARTMENT_OTHER): Payer: Medicare Other | Admitting: Hematology & Oncology

## 2014-06-03 ENCOUNTER — Ambulatory Visit: Payer: Medicare Other | Admitting: Oncology

## 2014-06-03 VITALS — BP 154/62 | HR 60 | Temp 97.8°F | Resp 14 | Ht 63.0 in | Wt 172.0 lb

## 2014-06-03 DIAGNOSIS — Z853 Personal history of malignant neoplasm of breast: Secondary | ICD-10-CM

## 2014-06-03 DIAGNOSIS — Z85118 Personal history of other malignant neoplasm of bronchus and lung: Secondary | ICD-10-CM

## 2014-06-03 DIAGNOSIS — C3491 Malignant neoplasm of unspecified part of right bronchus or lung: Secondary | ICD-10-CM

## 2014-06-03 DIAGNOSIS — C3490 Malignant neoplasm of unspecified part of unspecified bronchus or lung: Secondary | ICD-10-CM

## 2014-06-03 DIAGNOSIS — I2699 Other pulmonary embolism without acute cor pulmonale: Secondary | ICD-10-CM

## 2014-06-03 DIAGNOSIS — I82402 Acute embolism and thrombosis of unspecified deep veins of left lower extremity: Secondary | ICD-10-CM

## 2014-06-03 LAB — CBC WITH DIFFERENTIAL (CANCER CENTER ONLY)
BASO#: 0 10*3/uL (ref 0.0–0.2)
BASO%: 0.2 % (ref 0.0–2.0)
EOS%: 4.1 % (ref 0.0–7.0)
Eosinophils Absolute: 0.2 10*3/uL (ref 0.0–0.5)
HCT: 38.1 % (ref 34.8–46.6)
HEMOGLOBIN: 12.6 g/dL (ref 11.6–15.9)
LYMPH#: 1.7 10*3/uL (ref 0.9–3.3)
LYMPH%: 31.7 % (ref 14.0–48.0)
MCH: 29.3 pg (ref 26.0–34.0)
MCHC: 33.1 g/dL (ref 32.0–36.0)
MCV: 89 fL (ref 81–101)
MONO#: 0.4 10*3/uL (ref 0.1–0.9)
MONO%: 8.1 % (ref 0.0–13.0)
NEUT%: 55.9 % (ref 39.6–80.0)
NEUTROS ABS: 3 10*3/uL (ref 1.5–6.5)
Platelets: 227 10*3/uL (ref 145–400)
RBC: 4.3 10*6/uL (ref 3.70–5.32)
RDW: 14.1 % (ref 11.1–15.7)
WBC: 5.4 10*3/uL (ref 3.9–10.0)

## 2014-06-03 LAB — COMPREHENSIVE METABOLIC PANEL
ALBUMIN: 4 g/dL (ref 3.5–5.2)
ALK PHOS: 90 U/L (ref 39–117)
ALT: 8 U/L (ref 0–35)
AST: 14 U/L (ref 0–37)
BUN: 20 mg/dL (ref 6–23)
CHLORIDE: 106 meq/L (ref 96–112)
CO2: 29 mEq/L (ref 19–32)
Calcium: 9 mg/dL (ref 8.4–10.5)
Creatinine, Ser: 1.08 mg/dL (ref 0.50–1.10)
Glucose, Bld: 77 mg/dL (ref 70–99)
POTASSIUM: 3.8 meq/L (ref 3.5–5.3)
SODIUM: 144 meq/L (ref 135–145)
Total Bilirubin: 0.6 mg/dL (ref 0.2–1.2)
Total Protein: 6.9 g/dL (ref 6.0–8.3)

## 2014-06-03 LAB — LACTATE DEHYDROGENASE: LDH: 198 U/L (ref 94–250)

## 2014-06-06 NOTE — Progress Notes (Signed)
Hematology and Oncology Follow Up Visit  LEENAH SEIDNER [id number] 25-Sep-1936 78 y.o. 06/06/2014   Principle Diagnosis:  Stage IB ( T2a N0M0) adenocarcinoma of the right lung Stage 1 (T1N0M0) ductal carcinoma of the LEFT breast -ER+/HER2 -. Stage 1 (T1N0M0) ER+/HER2- ductal ca of RIGHT breast Pulmonary embolism-diagnosed in April of 2015  Current Therapy:    Observation     Interim History:  Ms.  Russum is is in for followup. She now is being seen in our clinic. She was previously seen by Dr. Beryle Beams. Today she's doing okay. She was last seen a year ago.  He was hospitalized in April. She had a pulmonary embolism. She was placed on heparin. She may have had a myocardial infarction. She apparently had a subarachnoid hemorrhage. An IVC filter was placed. She was found to have a thrombus in the left leg.  Hypercoagulable studies showed a low protein C and protein S. These probably will have to be repeated.  She has lower breast cancer. She's had mastectomies. She has had lung cancer. This was resected.   She was seen in the emergency room back in April. She had left sided facial droop. Should all for mental status. She denied any obvious findings on the CT of the brain.  She has been seen by neurology. She has been seen by cardiology.   There is no shortness of breath. There is no cough. She's had no obvious bleeding. There is no nausea vomiting.  Overall, her performance status is ECOG 2. As above Medications: Current outpatient prescriptions:donepezil (ARICEPT) 10 MG tablet, Take 1 tablet (10 mg total) by mouth at bedtime., Disp: 90 tablet, Rfl: 0;  furosemide (LASIX) 40 MG tablet, Take 20 mg by mouth 2 (two) times daily. Pt is now taking 40 mg in the morning (05/10/14), Disp: , Rfl: ;  levothyroxine (SYNTHROID, LEVOTHROID) 137 MCG tablet, Take 137 mcg by mouth daily before breakfast., Disp: , Rfl:  morphine (MSIR) 30 MG tablet, Take 30 mg by mouth 2 (two) times daily. , Disp: ,  Rfl: ;  oxyCODONE-acetaminophen (PERCOCET) 10-325 MG per tablet, Take 1 tablet by mouth every 4 (four) hours as needed (pain)., Disp: , Rfl: ;  potassium chloride SA (K-DUR,KLOR-CON) 20 MEQ tablet, Take 20 mEq by mouth daily., Disp: , Rfl:  temazepam (RESTORIL) 30 MG capsule, Take 1 capsule (30 mg total) by mouth at bedtime as needed for sleep., Disp: 30 capsule, Rfl: 5;  terconazole (TERAZOL 3) 0.8 % vaginal cream, Place 1 applicator vaginally at bedtime. , Disp: , Rfl: ;  valsartan (DIOVAN) 320 MG tablet, Take 1 tablet (320 mg total) by mouth daily., Disp: 30 tablet, Rfl: 6  Allergies:  Allergies  Allergen Reactions  . Simvastatin Other (See Comments)    myalgia  . Statins Other (See Comments)    Myalgias with multiple statins   . Strawberry Swelling    Swelling ,rash  . Hydrocodone-Homatropine Nausea Only  . Nsaids Other (See Comments)    unknown  . Penicillins Rash    Past Medical History, Surgical history, Social history, and Family History were reviewed and updated.  Review of Systems: As above  Physical Exam:  height is _0  (1.6 m) and weight is 172 lb (78.019 kg). Her oral temperature is 97.8 F (36.6 C). Her blood pressure is 154/62 and her pulse is 60. Her respiration is 14.   Elderly white female. Head and neck exam shows no ocular or oral lesion. She has no palpable cervical or  supraclavicular nodes. Lungs are with some slight decrease at the bases. Cardiac exam regular in with a 1/6 systolic ejection murmur. Breast exam showed bowel mastectomies. No chest wall nodules noted. Is no bilateral axillary adenopathy. Abdomen is soft. She has good bowel sounds. There is no palpable dullness. There is no palpable liver or spleen tip. Extremities shows no clubbing cyanosis or edema. No obvious venous cords noted in the left leg. Skin exam no rashes. Neurological exam shows no focal neurological deficits.  Lab Results  Component Value Date   WBC 5.4 06/03/2014   HGB 12.6 06/03/2014    HCT 38.1 06/03/2014   MCV 89 06/03/2014   PLT 227 06/03/2014     Chemistry      Component Value Date/Time   NA 144 06/03/2014 0906   NA 145 02/14/2014 1123   K 3.8 06/03/2014 0906   K 3.7 02/14/2014 1123   CL 106 06/03/2014 0906   CO2 29 06/03/2014 0906   CO2 28 02/14/2014 1123   BUN 20 06/03/2014 0906   BUN 19.7 02/14/2014 1123   CREATININE 1.08 06/03/2014 0906   CREATININE 1.0 02/14/2014 1123      Component Value Date/Time   CALCIUM 9.0 06/03/2014 0906   CALCIUM 9.6 02/14/2014 1123   ALKPHOS 90 06/03/2014 0906   ALKPHOS 104 02/14/2014 1123   AST 14 06/03/2014 0906   AST 22 02/14/2014 1123   ALT 8 06/03/2014 0906   ALT 15 02/14/2014 1123   BILITOT 0.6 06/03/2014 0906   BILITOT 1.04 02/14/2014 1123         Impression and Plan: Ms. Netzley is 53 her old white female. She's had bilateral breast cancer. She's had early-stage adenocarcinoma of the right lung. She now has a pulmonary embolism. She is not on anticoagulation because of subarachnoid bleeding. She does have a filter in place.  This is incredibly complicated. I was not aware of all he issues that were recently going on with her.  I think we'll have to follow her up closely. I know that she has a problem come in for visits in the past.  At some point, we probably will have to repeat her scans to see how things are looking.  I want to see her back in 6 weeks so that we can followup.   Volanda Napoleon, MD 7/9/20157:15 AM

## 2014-06-08 ENCOUNTER — Ambulatory Visit (INDEPENDENT_AMBULATORY_CARE_PROVIDER_SITE_OTHER): Payer: Medicare Other | Admitting: Family Medicine

## 2014-06-08 ENCOUNTER — Encounter: Payer: Self-pay | Admitting: Family Medicine

## 2014-06-08 VITALS — BP 102/58 | HR 78 | Temp 98.3°F | Wt 170.0 lb

## 2014-06-08 DIAGNOSIS — N3000 Acute cystitis without hematuria: Secondary | ICD-10-CM

## 2014-06-08 DIAGNOSIS — G459 Transient cerebral ischemic attack, unspecified: Secondary | ICD-10-CM

## 2014-06-08 DIAGNOSIS — N390 Urinary tract infection, site not specified: Secondary | ICD-10-CM

## 2014-06-08 LAB — POCT URINALYSIS DIPSTICK
BILIRUBIN UA: NEGATIVE
Blood, UA: NEGATIVE
GLUCOSE UA: NEGATIVE
Ketones, UA: NEGATIVE
NITRITE UA: NEGATIVE
PH UA: 5
Protein, UA: NEGATIVE
Urobilinogen, UA: 0.2

## 2014-06-08 MED ORDER — CIPROFLOXACIN HCL 250 MG PO TABS: 250.0000 mg | ORAL_TABLET | Freq: Two times a day (BID) | ORAL | Status: DC

## 2014-06-08 NOTE — Patient Instructions (Signed)
Drink plenty of water and start the antibiotics today.  We'll contact you with your lab report.  Take care.   

## 2014-06-08 NOTE — Progress Notes (Signed)
Pre visit review using our clinic review tool, if applicable. No additional management support is needed unless otherwise documented below in the visit note.  H/o lung and breast cancer, h/o PE with an IVC placed.  H/o likely UTI about 1 month ago s/p cipro.  She improved after the cipro.  Now with similar pain in the lats few days.  Pain the lower abd and lower back.  No fevers.  No vomiting, no diarrhea.  No cough, not SOB, no CP.  Not burning with urination now.  H/o UTIs noted.   Meds, vitals, and allergies reviewed.   ROS: See HPI.  Otherwise, noncontributory.  GEN: nad, alert HEENT: mucous membranes moist NECK: supple CV: rrr.  PULM: ctab, no inc wob ABD: soft, +bs, suprapubic area not tender now (but was tender prev per patient report) EXT: trace edema SKIN: no acute rash BACK: no CVA pain

## 2014-06-08 NOTE — Assessment & Plan Note (Signed)
Presumed, start cipro and check ucx.  D/w pt and daughter.  All agree.  Nontoxic, okay for outpatient f/u.

## 2014-06-09 LAB — URINE CULTURE: Colony Count: 50000

## 2014-06-11 ENCOUNTER — Other Ambulatory Visit: Payer: Self-pay | Admitting: Family Medicine

## 2014-06-11 MED ORDER — CEPHALEXIN 500 MG PO CAPS: 500.0000 mg | ORAL_CAPSULE | Freq: Two times a day (BID) | ORAL | Status: DC

## 2014-06-12 ENCOUNTER — Telehealth: Payer: Self-pay | Admitting: Hematology & Oncology

## 2014-06-12 NOTE — Telephone Encounter (Signed)
Per order to sch lab/md follow up and doppler.  Apts was sch for 07/18/14... Patient was called to give the date/times of apts.  Patient did not answer so i left voice mail

## 2014-06-24 ENCOUNTER — Ambulatory Visit (HOSPITAL_COMMUNITY): Payer: Medicare Other | Attending: Cardiology | Admitting: Radiology

## 2014-06-24 DIAGNOSIS — I4891 Unspecified atrial fibrillation: Secondary | ICD-10-CM | POA: Diagnosis not present

## 2014-06-24 DIAGNOSIS — Z87891 Personal history of nicotine dependence: Secondary | ICD-10-CM | POA: Diagnosis not present

## 2014-06-24 DIAGNOSIS — I2789 Other specified pulmonary heart diseases: Secondary | ICD-10-CM

## 2014-06-24 DIAGNOSIS — I509 Heart failure, unspecified: Secondary | ICD-10-CM | POA: Diagnosis not present

## 2014-06-24 DIAGNOSIS — I517 Cardiomegaly: Secondary | ICD-10-CM | POA: Diagnosis not present

## 2014-06-24 DIAGNOSIS — I5032 Chronic diastolic (congestive) heart failure: Secondary | ICD-10-CM

## 2014-06-24 DIAGNOSIS — I079 Rheumatic tricuspid valve disease, unspecified: Secondary | ICD-10-CM | POA: Insufficient documentation

## 2014-06-24 DIAGNOSIS — E785 Hyperlipidemia, unspecified: Secondary | ICD-10-CM | POA: Insufficient documentation

## 2014-06-24 DIAGNOSIS — I059 Rheumatic mitral valve disease, unspecified: Secondary | ICD-10-CM | POA: Insufficient documentation

## 2014-06-24 DIAGNOSIS — C50919 Malignant neoplasm of unspecified site of unspecified female breast: Secondary | ICD-10-CM | POA: Insufficient documentation

## 2014-06-24 DIAGNOSIS — I2699 Other pulmonary embolism without acute cor pulmonale: Secondary | ICD-10-CM | POA: Insufficient documentation

## 2014-06-24 DIAGNOSIS — I82409 Acute embolism and thrombosis of unspecified deep veins of unspecified lower extremity: Secondary | ICD-10-CM | POA: Diagnosis not present

## 2014-06-24 DIAGNOSIS — Z9221 Personal history of antineoplastic chemotherapy: Secondary | ICD-10-CM | POA: Diagnosis not present

## 2014-06-24 DIAGNOSIS — I10 Essential (primary) hypertension: Secondary | ICD-10-CM | POA: Diagnosis not present

## 2014-06-24 NOTE — Progress Notes (Signed)
Echocardiogram performed.  

## 2014-06-25 ENCOUNTER — Telehealth: Payer: Self-pay | Admitting: Gastroenterology

## 2014-06-25 NOTE — Telephone Encounter (Signed)
Daughter reports abdominal pain that has been present for a few weeks and is upper abdominal.  She will come in and see Tye Savoy RNP on 07/01/14 3:00

## 2014-06-28 ENCOUNTER — Ambulatory Visit (INDEPENDENT_AMBULATORY_CARE_PROVIDER_SITE_OTHER): Payer: Medicare Other

## 2014-06-28 VITALS — BP 140/68 | HR 76 | Resp 16

## 2014-06-28 DIAGNOSIS — M79609 Pain in unspecified limb: Secondary | ICD-10-CM

## 2014-06-28 DIAGNOSIS — B351 Tinea unguium: Secondary | ICD-10-CM

## 2014-06-28 DIAGNOSIS — G459 Transient cerebral ischemic attack, unspecified: Secondary | ICD-10-CM

## 2014-06-28 DIAGNOSIS — M79606 Pain in leg, unspecified: Secondary | ICD-10-CM

## 2014-06-28 MED ORDER — TAVABOROLE 5 % EX SOLN: CUTANEOUS | Status: DC

## 2014-06-28 NOTE — Progress Notes (Signed)
   Subjective:    Patient ID: Alexa [name], female    DOB: 07-17-1936, 78 y.o.   MRN: [id number]  HPI Comments: "I have some toes that hurt. They might be ingrown"  Patient c/o tender 1st toes bilateral and 2nd toes right for few weeks. Her daughter is with her today. She thinks they may be ingrown toenails. She also states that she takes her to get regular pedicures and they trim, but recently staying sore afterwards.   Toe Pain       Review of Systems  Respiratory: Positive for cough and shortness of breath.   Cardiovascular: Positive for leg swelling.  Gastrointestinal: Positive for abdominal pain.  Allergic/Immunologic: Positive for food allergies.  Hematological: Bruises/bleeds easily.  Psychiatric/Behavioral: Positive for confusion. The patient is nervous/anxious.   All other systems reviewed and are negative.      Objective:   Physical Exam 78 year old white female well-developed well-nourished oriented x3 presents at this time with family member for evaluation has painful nails with ingrowing nail tendency with review both great toes the lesser digits also thick and yellowed and brittle has been getting nail care in on however continues have pain with enclosed shoes walking activities or tenderness on palpation of the hallux second third and fourth digits both feet hallux being most severe. Lower extremity objective findings reveal pedal pulses palpable DP +2/4 PT one over 4 mild varicosities noted minimal edema noted neurologically epicritic and proprioceptive sensations intact and symmetric bilateral there is normal plantar response DTRs not listed neurologically skin color pigment normal hair growth absent nails somewhat criptotic the hallux nails bilateral with yellowing and thickening discoloration the nails also apply nail polish recently. Orthopedic biomechanical exam rectal for mild digital contractures no signs of fracture no osseous abnormality no secondary infections  at this time no open wounds or ulcers noted       Assessment & Plan:  Assessment onychomycosis ingrowing criptotic nails hallux through fourth digits bilateral painful tender and symptomatic these nails are debrided at this time future palliative care and as-needed basis lungs is pain or symptomology mycotic nails T., will initiate topical antifungal therapy utilizing keratin prescription for keratin is [facility] patient will receive her medications female start applying daily as instructed for 12 months reappointed in 3 months for continued palliative care in the future as needed next progress Harriet Masson DPM

## 2014-06-28 NOTE — Patient Instructions (Signed)

## 2014-07-01 ENCOUNTER — Encounter: Payer: Self-pay | Admitting: Nurse Practitioner

## 2014-07-01 ENCOUNTER — Encounter: Payer: Self-pay | Admitting: *Deleted

## 2014-07-01 ENCOUNTER — Ambulatory Visit: Payer: Medicare Other | Admitting: Nurse Practitioner

## 2014-07-01 VITALS — BP 158/80 | HR 64 | Ht 62.5 in | Wt 171.0 lb

## 2014-07-01 DIAGNOSIS — G8929 Other chronic pain: Secondary | ICD-10-CM

## 2014-07-01 DIAGNOSIS — R1031 Right lower quadrant pain: Principal | ICD-10-CM

## 2014-07-01 DIAGNOSIS — R1032 Left lower quadrant pain: Secondary | ICD-10-CM

## 2014-07-01 NOTE — Progress Notes (Signed)
HPI :  Patient is a 78 year old female with multiple significant medical problems including, but not limited to, breast cancer, lung cancer status post lobectomy last year, history of bilateral pulmonary emboli status post recent IVC filter. She also has atrial fibrillation, chronic diastolic heart failure, HTN and thyroid disease. She is on multiple multiple medications. Patient was seen by her PCP earlier this month for lower abdominal pain and lower back pain. She had similar symptoms associated with a urinary tract infection one month prior so PCP repeated urine studies. Culture grew only 50,000 Group B strep. She was treated with Keflex. Patient's lower abdominal pain is intermittent, predominantly LLQ and has persisted despite antibiotics for UTI. No aggravating or alleviating factors. Bowels are moving well.. Pain not related to physical activity. Patient has chronic low back pain which is no worse than usual. Patient is not constipated, she has no blood in her stool. No fevers. No vaginal symptoms. She is s/p remote hysterectomy. Patient is here with her daughter who is very concerned about her mother. Patient doesn't usually complain of pain but has laid around in bed crying because of the pain. Over the last year patient has lost a significant amount of weight but daughter attributes that to several medical problems. She has regained some of the lost weight.  Past Medical History  Diagnosis Date  . Hyperlipidemia     a. Myalgias with multiple statins.  . Hypertension   . Adenocarcinoma of breast     a. s/p right simple and left modified radical mastectomy with chemotherapy and radiation in 2002.   Marland Kitchen GERD (gastroesophageal reflux disease)   . Diverticulosis   . Hx of adenomatous colonic polyps   . DVT (deep venous thrombosis) 09-30-11    a. Right lower leg 09/2011.  . Arthritis   . Hypothyroidism   . Dementia     a. sees Dr. Brett Fairy. b. on Aricept.  Marland Kitchen History of kidney stones   .  DDD (degenerative disc disease)     a. cervical and lumbar herniation and DJD s/p multiple diskectomies and other back surgeries from 2002-2008   . Asthma   . H/O vein stripping   . History of tobacco abuse     a. quit 25+ years ago. PFTs in 2/15 were relatively normal.   . Chronic diastolic CHF (congestive heart failure)     a. ETT-myoview (9/12) with 4' exercise, EF 68%, no ischemia or infarction. b. Echo (10/14) with EF 55-60%, mod diast dysfunction, normal RV size/fcn, mod pulm HTN.   . Venous insufficiency     a. H/o venous stasis ulceration.   Marland Kitchen PAF (paroxysmal atrial fibrillation)     a. Only episode that noted was in setting of right upper lobectomy in 2014. She was briefly on amiodarone but stopped it on her own. She is not coumadin. If recurrent AF, will need to consider.  . Non-small cell lung cancer     a. s/p right upper lobectomy in 2014.   . Pulmonary HTN     a. Moderate by echo 08/2013.  Marland Kitchen Cerebral infarct     a.  Remote anterior left frontal lobe infarct by CT head 01/2014.  . PE (pulmonary embolism)   . Alzheimer's disease    Family History  Problem Relation Age of Onset  . Diabetes Daughter   . Diabetes Daughter   . Heart disease Father   . Heart disease Brother   . Heart disease Sister   .  Colon cancer Daughter 107  . Colon polyps Daughter   . Stroke Mother   . Liver disease Daughter    History  Substance Use Topics  . Smoking status: Former Smoker -- 1.00 packs/day for 25 years    Types: Cigarettes    Start date: 06/04/1959    Quit date: 05/10/1984  . Smokeless tobacco: Never Used     Comment: quit 34 years ago  . Alcohol Use: No   Current Outpatient Prescriptions  Medication Sig Dispense Refill  . donepezil (ARICEPT) 10 MG tablet Take 1 tablet (10 mg total) by mouth at bedtime.  90 tablet  0  . furosemide (LASIX) 40 MG tablet Take 20 mg by mouth 2 (two) times daily. Pt is now taking 40 mg in the morning (05/10/14)      . levothyroxine (SYNTHROID,  LEVOTHROID) 137 MCG tablet Take 137 mcg by mouth daily before breakfast.      . morphine (MSIR) 30 MG tablet Take 30 mg by mouth 2 (two) times daily.       Marland Kitchen oxyCODONE-acetaminophen (PERCOCET) 10-325 MG per tablet Take 1 tablet by mouth every 4 (four) hours as needed (pain).      . potassium chloride SA (K-DUR,KLOR-CON) 20 MEQ tablet Take 20 mEq by mouth daily.      . Tavaborole (KERYDIN) 5 % SOLN Apply 1 drop each affected toenails once daily for 12 month  10 mL  6  . valsartan (DIOVAN) 320 MG tablet Take 1 tablet (320 mg total) by mouth daily.  30 tablet  6   No current facility-administered medications for this visit.   Allergies  Allergen Reactions  . Simvastatin Other (See Comments)    myalgia  . Statins Other (See Comments)    Myalgias with multiple statins   . Strawberry Swelling    Swelling ,rash  . Hydrocodone-Homatropine Nausea Only  . Nsaids Other (See Comments)    unknown  . Penicillins Rash    Review of Systems: All systems reviewed and negative except where noted in HPI.    Physical Exam: BP 158/80  Pulse 64  Ht 5' 2.5" (1.588 m)  Wt 171 lb (77.565 kg)  BMI 30.76 kg/m2 Constitutional: Pleasant,well-developed, white female in no acute distress. HEENT: Normocephalic and atraumatic. Conjunctivae are normal. No scleral icterus. Neck supple.  Cardiovascular: Normal rate, regular rhythm.  Pulmonary/chest: Effort normal and breath sounds normal. No wheezing, rales or rhonchi. Abdominal: Soft, nondistended, nontender. Bowel sounds active throughout. There are no masses palpable. No hepatomegaly. Extremities: no edema Lymphadenopathy: No cervical adenopathy noted. Neurological: Alert and oriented to person place and time. Skin: Skin is warm and dry. No rashes noted. Psychiatric: Normal mood and affect. Behavior is normal.   ASSESSMENT AND PLAN:  #48. 78 year old female with a several week history of lower abdominal pain, mainly left lower quadrant. She's been  treated for a UTI on two recent occasions without resolution of pain. Low-grade diverticulitis is possible. Other diagnostic considerations include musculoskeletal pain, gynecologic etiology. For further evaluation patient will be scheduled for CT scan of the abdomen and pelvis with contrast. Will call her with the results and further recommendations.  #2. Multiple, significant medical problems as listed above  #3  History of colon polyps. Last colonoscopy March 2010 with findings of mild sigmoid diverticulosis and a small ascending colon sessile polyp.  Colonoscopy recall letter was sent in January of this year the patient is high risk candidate for procedure. Daughter does not feel her mother could undergo  bowel prep anyway..  #4. vitamin B12 deficiency. Level only 177 in June, I don't see that it is being replaced but will defer to PCP

## 2014-07-01 NOTE — [facility] Oncology Navigator Note (Signed)
I called patient to check in.  Patient says she is doing fairly well.   She has been experiencing some abdominal pain and has an appointment with Tye Savoy this afternoon.  She denies any breathing difficulties.  We discussed her appointment with Dr. Marin Olp as her new oncologist and she was very pleased.  I reminded her of her follow up appointment with him on 07/18/14 and she wrote it down.  Patient denied any questions or concerns at this time.  I encouraged her to call me for any needs.

## 2014-07-01 NOTE — Patient Instructions (Signed)
  You have been scheduled for a CT scan of the abdomen and pelvis at [place] ([address]---this is in the same building as Press photographer).   You are scheduled on 07-02-2014 at 3:00 PM . You should arrive  At 2:45 PM prior to your appointment time for registration. Please follow the written instructions below on the day of your exam:  WARNING: IF YOU ARE ALLERGIC TO IODINE/X-RAY DYE, PLEASE NOTIFY RADIOLOGY IMMEDIATELY AT [phone]! YOU WILL BE GIVEN A 13 HOUR PREMEDICATION PREP.  1) Do not eat or drink anything after 11:00 am  (4 hours prior to your test) 2) You have been given 2 bottles of oral contrast to drink. The solution may taste better if refrigerated, but do NOT add ice or any other liquid to this solution. Shake well before drinking.    Drink 1 bottle of contrast @ 1:00 PM  (2 hours prior to your exam)  Drink 1 bottle of contrast @ 2:00 PM  (1 hour prior to your exam)  You may take any medications as prescribed with a small amount of water except for the following: Metformin, Glucophage, Glucovance, Avandamet, Riomet, Fortamet, Actoplus Met, Janumet, Glumetza or Metaglip. The above medications must be held the day of the exam AND 48 hours after the exam.  The purpose of you drinking the oral contrast is to aid in the visualization of your intestinal tract. The contrast solution may cause some diarrhea. Before your exam is started, you will be given a small amount of fluid to drink. Depending on your individual set of symptoms, you may also receive an intravenous injection of x-ray contrast/dye. Plan on being at [facility] for 30 minutes or long, depending on the type of exam you are having performed.  If you have any questions regarding your exam or if you need to reschedule, you may call the CT department at [phone] between the hours of 8:00 am and 5:00 pm,  Monday-Friday.  ________________________________________________________________________

## 2014-07-02 ENCOUNTER — Ambulatory Visit (INDEPENDENT_AMBULATORY_CARE_PROVIDER_SITE_OTHER)
Admission: RE | Admit: 2014-07-02 | Discharge: 2014-07-02 | Disposition: A | Discharge status: Home / Self Care | Payer: Medicare Other | Source: Ambulatory Visit | Attending: Nurse Practitioner | Admitting: Nurse Practitioner

## 2014-07-02 DIAGNOSIS — R1031 Right lower quadrant pain: Secondary | ICD-10-CM

## 2014-07-02 DIAGNOSIS — G8929 Other chronic pain: Secondary | ICD-10-CM

## 2014-07-02 DIAGNOSIS — R1032 Left lower quadrant pain: Secondary | ICD-10-CM | POA: Insufficient documentation

## 2014-07-02 MED ORDER — IOHEXOL 300 MG/ML  SOLN
100.0000 mL | Freq: Once | INTRAMUSCULAR | Status: AC | PRN
  Administered 2014-07-02: 100 mL via INTRAVENOUS

## 2014-07-02 NOTE — Progress Notes (Signed)
Reviewed and agree with management plan.  Mozell Hardacre T. Hollis Tuller, MD FACG 

## 2014-07-03 ENCOUNTER — Other Ambulatory Visit: Payer: Self-pay

## 2014-07-03 DIAGNOSIS — F0150 Vascular dementia without behavioral disturbance: Secondary | ICD-10-CM

## 2014-07-03 MED ORDER — DONEPEZIL HCL 10 MG PO TABS: 10.0000 mg | ORAL_TABLET | Freq: Every day | ORAL | Status: DC

## 2014-07-04 ENCOUNTER — Other Ambulatory Visit: Payer: Self-pay | Admitting: *Deleted

## 2014-07-04 DIAGNOSIS — R109 Unspecified abdominal pain: Secondary | ICD-10-CM

## 2014-07-15 ENCOUNTER — Telehealth: Payer: Self-pay | Admitting: Hematology & Oncology

## 2014-07-15 NOTE — Telephone Encounter (Signed)
[name] pt's daughter moved 8-20 doppler to 8-21, she is aware we moved 8-20 MD to 9-3

## 2014-07-18 ENCOUNTER — Other Ambulatory Visit: Payer: Medicare Other | Admitting: Lab

## 2014-07-18 ENCOUNTER — Ambulatory Visit: Payer: Medicare Other | Admitting: Hematology & Oncology

## 2014-07-18 ENCOUNTER — Ambulatory Visit (HOSPITAL_BASED_OUTPATIENT_CLINIC_OR_DEPARTMENT_OTHER): Payer: Medicare Other

## 2014-07-19 ENCOUNTER — Ambulatory Visit (HOSPITAL_BASED_OUTPATIENT_CLINIC_OR_DEPARTMENT_OTHER): Payer: Medicare Other

## 2014-07-23 ENCOUNTER — Ambulatory Visit (HOSPITAL_BASED_OUTPATIENT_CLINIC_OR_DEPARTMENT_OTHER)
Admission: RE | Admit: 2014-07-23 | Discharge: 2014-07-23 | Disposition: A | Discharge status: Home / Self Care | Payer: Medicare Other | Source: Ambulatory Visit | Attending: Hematology & Oncology | Admitting: Hematology & Oncology

## 2014-07-23 DIAGNOSIS — I82409 Acute embolism and thrombosis of unspecified deep veins of unspecified lower extremity: Secondary | ICD-10-CM | POA: Insufficient documentation

## 2014-07-23 DIAGNOSIS — I82402 Acute embolism and thrombosis of unspecified deep veins of left lower extremity: Secondary | ICD-10-CM

## 2014-07-24 ENCOUNTER — Encounter: Payer: Self-pay | Admitting: *Deleted

## 2014-07-29 ENCOUNTER — Other Ambulatory Visit: Payer: Self-pay

## 2014-07-29 DIAGNOSIS — E876 Hypokalemia: Secondary | ICD-10-CM

## 2014-07-29 DIAGNOSIS — R0600 Dyspnea, unspecified: Secondary | ICD-10-CM

## 2014-07-29 DIAGNOSIS — I272 Pulmonary hypertension, unspecified: Secondary | ICD-10-CM

## 2014-07-29 MED ORDER — VALSARTAN 320 MG PO TABS: 320.0000 mg | ORAL_TABLET | Freq: Every day | ORAL | Status: DC

## 2014-08-01 ENCOUNTER — Ambulatory Visit (HOSPITAL_BASED_OUTPATIENT_CLINIC_OR_DEPARTMENT_OTHER): Payer: Medicare Other | Admitting: Hematology & Oncology

## 2014-08-01 ENCOUNTER — Other Ambulatory Visit (HOSPITAL_BASED_OUTPATIENT_CLINIC_OR_DEPARTMENT_OTHER): Payer: Medicare Other | Admitting: Lab

## 2014-08-01 ENCOUNTER — Encounter: Payer: Self-pay | Admitting: Hematology & Oncology

## 2014-08-01 VITALS — BP 152/56 | HR 66 | Temp 98.8°F | Resp 14 | Ht 62.0 in | Wt 169.0 lb

## 2014-08-01 DIAGNOSIS — Z85118 Personal history of other malignant neoplasm of bronchus and lung: Secondary | ICD-10-CM

## 2014-08-01 DIAGNOSIS — C3491 Malignant neoplasm of unspecified part of right bronchus or lung: Secondary | ICD-10-CM

## 2014-08-01 DIAGNOSIS — Z853 Personal history of malignant neoplasm of breast: Secondary | ICD-10-CM

## 2014-08-01 DIAGNOSIS — I82402 Acute embolism and thrombosis of unspecified deep veins of left lower extremity: Secondary | ICD-10-CM

## 2014-08-01 DIAGNOSIS — R229 Localized swelling, mass and lump, unspecified: Secondary | ICD-10-CM

## 2014-08-01 LAB — CBC WITH DIFFERENTIAL (CANCER CENTER ONLY)
BASO#: 0 10*3/uL (ref 0.0–0.2)
BASO%: 0.2 % (ref 0.0–2.0)
EOS%: 1.6 % (ref 0.0–7.0)
Eosinophils Absolute: 0.1 10*3/uL (ref 0.0–0.5)
HCT: 38.1 % (ref 34.8–46.6)
HGB: 12.7 g/dL (ref 11.6–15.9)
LYMPH#: 1.8 10*3/uL (ref 0.9–3.3)
LYMPH%: 30.3 % (ref 14.0–48.0)
MCH: 29.6 pg (ref 26.0–34.0)
MCHC: 33.3 g/dL (ref 32.0–36.0)
MCV: 89 fL (ref 81–101)
MONO#: 0.5 10*3/uL (ref 0.1–0.9)
MONO%: 7.8 % (ref 0.0–13.0)
NEUT#: 3.5 10*3/uL (ref 1.5–6.5)
NEUT%: 60.1 % (ref 39.6–80.0)
PLATELETS: 226 10*3/uL (ref 145–400)
RBC: 4.29 10*6/uL (ref 3.70–5.32)
RDW: 13.1 % (ref 11.1–15.7)
WBC: 5.8 10*3/uL (ref 3.9–10.0)

## 2014-08-02 NOTE — Progress Notes (Signed)
Hematology and Oncology Follow Up Visit  Alexa [name] [id number] 10/05/1936 78 y.o. 08/02/2014   Principle Diagnosis:  Stage IB ( T2a N0M0) adenocarcinoma of the right lung Stage 1 (T1N0M0) ductal carcinoma of the LEFT breast -ER+/HER2 -. Stage 1 (T1N0M0) ER+/HER2- ductal ca of RIGHT breast Pulmonary embolism-diagnosed in April of 2015  Current Therapy:    Observation     Interim History:  Alexa [name] is back for followup  Saw her back in early July. Since then, she's been doing okay. She did have this abdominal pain. She underwent scans. She had a CT scan of the abdomen and pelvis in early August. This is pretty much unremarkable. There is no evidence of metastatic disease.  On her back, as the end of her laminectomy scar, there is a lump. She says this is getting larger. She has some pain associated with this. We will have to get a CT scan. We will then possibly do a biopsy.  She had Doppler of her legs in late August. There is no thromboembolic disease in the legs.  She does have a filter in place.  She's had no cough. She's had no weight loss weight gain. She's had no change in bowel or bladder habits. She's had no rashes. She is off anticoagulation. Again, this is why she has the IVC filter in place.  Medications: Current outpatient prescriptions:donepezil (ARICEPT) 10 MG tablet, Take 1 tablet (10 mg total) by mouth at bedtime., Disp: 90 tablet, Rfl: 1;  furosemide (LASIX) 40 MG tablet, Take 20 mg by mouth 2 (two) times daily. Pt is now taking 40 mg in the morning (05/10/14), Disp: , Rfl: ;  levothyroxine (SYNTHROID, LEVOTHROID) 137 MCG tablet, Take 137 mcg by mouth daily before breakfast., Disp: , Rfl:  morphine (MSIR) 30 MG tablet, Take 30 mg by mouth 2 (two) times daily. , Disp: , Rfl: ;  oxyCODONE-acetaminophen (PERCOCET) 10-325 MG per tablet, Take 1 tablet by mouth every 4 (four) hours as needed (pain)., Disp: , Rfl: ;  potassium chloride SA (K-DUR,KLOR-CON) 20 MEQ tablet,  Take 20 mEq by mouth daily., Disp: , Rfl: ;  Tavaborole (KERYDIN) 5 % SOLN, Apply 1 drop each affected toenails once daily for 12 month, Disp: 10 mL, Rfl: 6 valsartan (DIOVAN) 320 MG tablet, Take 1 tablet (320 mg total) by mouth daily., Disp: 30 tablet, Rfl: 3  Allergies:  Allergies  Allergen Reactions  . Simvastatin Other (See Comments)    myalgia  . Statins Other (See Comments)    Myalgias with multiple statins   . Strawberry Swelling    Swelling ,rash  . Hydrocodone-Homatropine Nausea Only  . Nsaids Other (See Comments)    unknown  . Penicillins Rash    Past Medical History, Surgical history, Social history, and Family History were reviewed and updated.  Review of Systems: As above  Physical Exam:  height is 5' 2" (1.575 m) and weight is 169 lb (76.658 kg). Her oral temperature is 98.8 F (37.1 C). Her blood pressure is 152/56 and her pulse is 66. Her respiration is 14.    Elderly white female. Head and neck exam shows no ocular or oral lesion. She has no palpable cervical or supraclavicular nodes. Lungs are with some slight decrease at the bases. Cardiac exam regular in with a 1/6 systolic ejection murmur. Breast exam showed bowel mastectomies. No chest wall nodules noted. Is no bilateral axillary adenopathy. Abdomen is soft. She has good bowel sounds. There is no palpable dullness. There  is no palpable liver or spleen tip. Back exam shows at the proximal end of her laminectomy scar, a subcutaneous nodule. This is on the left side of the spine. It is firm. It is slightly mobile. Upon measures about 1 -1.5 cm. It is slightly tender to touch. No other abnormalities noted on back exam. The laminectomy scar is well-healed. Extremities shows no clubbing cyanosis or edema. No obvious venous cords noted in the left leg. Skin exam no rashes. Neurological exam shows no focal neurological deficits.   Lab Results  Component Value Date   WBC 5.8 08/01/2014   HGB 12.7 08/01/2014   HCT 38.1  08/01/2014   MCV 89 08/01/2014   PLT 226 08/01/2014     Chemistry      Component Value Date/Time   NA 144 06/03/2014 0906   NA 145 02/14/2014 1123   K 3.8 06/03/2014 0906   K 3.7 02/14/2014 1123   CL 106 06/03/2014 0906   CO2 29 06/03/2014 0906   CO2 28 02/14/2014 1123   BUN 20 06/03/2014 0906   BUN 19.7 02/14/2014 1123   CREATININE 1.08 06/03/2014 0906   CREATININE 1.0 02/14/2014 1123      Component Value Date/Time   CALCIUM 9.0 06/03/2014 0906   CALCIUM 9.6 02/14/2014 1123   ALKPHOS 90 06/03/2014 0906   ALKPHOS 104 02/14/2014 1123   AST 14 06/03/2014 0906   AST 22 02/14/2014 1123   ALT 8 06/03/2014 0906   ALT 15 02/14/2014 1123   BILITOT 0.6 06/03/2014 0906   BILITOT 1.04 02/14/2014 1123         Impression and Plan: Alexa [name] is a 78 year old female. She's had multiple malignancies. So far, there is no evidence of recurrence of the lung cancer or breast cancer.  I think we can just watch her for right now. There really is now much that we have to do.  I will plan to see her back in probably 3 or 4 months.  Volanda Napoleon, MD 9/4/20157:24 AM

## 2014-08-06 LAB — PROTEIN C ACTIVITY: PROTEIN C ACTIVITY: 122 % (ref 75–133)

## 2014-08-06 LAB — D-DIMER, QUANTITATIVE (NOT AT ARMC): D-Dimer, Quant: 2.29 ug/mL-FEU — ABNORMAL HIGH (ref 0.00–0.48)

## 2014-08-06 LAB — PROTEIN S ACTIVITY: PROTEIN S ACTIVITY: 74 % (ref 69–129)

## 2014-08-06 LAB — PROTEIN S, TOTAL: Protein S Total: 70 % (ref 60–150)

## 2014-08-06 LAB — PROTEIN C, TOTAL: PROTEIN C, TOTAL: 78 % (ref 72–160)

## 2014-08-07 ENCOUNTER — Encounter: Payer: Self-pay | Admitting: Gastroenterology

## 2014-08-07 ENCOUNTER — Telehealth: Payer: Self-pay | Admitting: Family Medicine

## 2014-08-07 ENCOUNTER — Encounter: Payer: Self-pay | Admitting: *Deleted

## 2014-08-07 NOTE — Telephone Encounter (Signed)
Pt is scheduled for ct scan w/ contract and cmp is needed, dr Marin Olp has put order in epic, is it ok for pt to get that done here.? nikki states pls call pt when approved. Need done asap

## 2014-08-07 NOTE — Progress Notes (Signed)
Called pt's PCP to set pt up with lab draw for tomorrow in order to obtain the CMP that is needed for CT contrast. PCP secretary stated she would call the pt with the appt time. Informed pt's daughter of this information. Informed Imaging of this information. Imaging is going to call pt with new appt.

## 2014-08-07 NOTE — Telephone Encounter (Signed)
Per Dr. Sarajane Jews, okay for pt to have labs drawn here, just need to change the ordering provider.

## 2014-08-08 ENCOUNTER — Other Ambulatory Visit (INDEPENDENT_AMBULATORY_CARE_PROVIDER_SITE_OTHER): Payer: Medicare Other

## 2014-08-08 ENCOUNTER — Ambulatory Visit (HOSPITAL_BASED_OUTPATIENT_CLINIC_OR_DEPARTMENT_OTHER): Payer: Medicare Other

## 2014-08-08 DIAGNOSIS — C3491 Malignant neoplasm of unspecified part of right bronchus or lung: Secondary | ICD-10-CM

## 2014-08-08 DIAGNOSIS — C3490 Malignant neoplasm of unspecified part of unspecified bronchus or lung: Secondary | ICD-10-CM

## 2014-08-08 DIAGNOSIS — R229 Localized swelling, mass and lump, unspecified: Secondary | ICD-10-CM

## 2014-08-08 LAB — CBC WITH DIFFERENTIAL/PLATELET
BASOS ABS: 0 10*3/uL (ref 0.0–0.1)
Basophils Relative: 0.5 % (ref 0.0–3.0)
EOS ABS: 0.1 10*3/uL (ref 0.0–0.7)
Eosinophils Relative: 0.8 % (ref 0.0–5.0)
HCT: 37.2 % (ref 36.0–46.0)
Hemoglobin: 12.3 g/dL (ref 12.0–15.0)
LYMPHS PCT: 14.9 % (ref 12.0–46.0)
Lymphs Abs: 1.1 10*3/uL (ref 0.7–4.0)
MCHC: 33.1 g/dL (ref 30.0–36.0)
MCV: 87.5 fl (ref 78.0–100.0)
Monocytes Absolute: 0.5 10*3/uL (ref 0.1–1.0)
Monocytes Relative: 6.8 % (ref 3.0–12.0)
Neutro Abs: 5.4 10*3/uL (ref 1.4–7.7)
Neutrophils Relative %: 77 % (ref 43.0–77.0)
Platelets: 233 10*3/uL (ref 150.0–400.0)
RBC: 4.25 Mil/uL (ref 3.87–5.11)
RDW: 13.2 % (ref 11.5–15.5)
WBC: 7.1 10*3/uL (ref 4.0–10.5)

## 2014-08-08 LAB — COMPREHENSIVE METABOLIC PANEL
ALT: 63 U/L — AB (ref 0–35)
AST: 137 U/L — AB (ref 0–37)
Albumin: 3.6 g/dL (ref 3.5–5.2)
Alkaline Phosphatase: 147 U/L — ABNORMAL HIGH (ref 39–117)
BUN: 16 mg/dL (ref 6–23)
CHLORIDE: 107 meq/L (ref 96–112)
CO2: 30 mEq/L (ref 19–32)
CREATININE: 1 mg/dL (ref 0.4–1.2)
Calcium: 9 mg/dL (ref 8.4–10.5)
GFR: 60.01 mL/min (ref >60.00)
Glucose, Bld: 107 mg/dL — ABNORMAL HIGH (ref 70–99)
Potassium: 4.8 mEq/L (ref 3.5–5.1)
Sodium: 143 mEq/L (ref 135–145)
Total Bilirubin: 1.2 mg/dL (ref 0.2–1.2)
Total Protein: 7.3 g/dL (ref 6.0–8.3)

## 2014-08-08 NOTE — Telephone Encounter (Signed)
Pt had labs done today.

## 2014-08-22 ENCOUNTER — Ambulatory Visit: Payer: Medicare Other | Admitting: Emergency Medicine

## 2014-08-24 ENCOUNTER — Encounter: Payer: Self-pay | Admitting: Gastroenterology

## 2014-08-26 ENCOUNTER — Ambulatory Visit (HOSPITAL_BASED_OUTPATIENT_CLINIC_OR_DEPARTMENT_OTHER): Admission: RE | Admit: 2014-08-26 | Payer: Medicare Other | Source: Ambulatory Visit

## 2014-08-26 ENCOUNTER — Encounter: Payer: Medicare Other | Admitting: Family Medicine

## 2014-09-09 ENCOUNTER — Encounter: Payer: Self-pay | Admitting: Family Medicine

## 2014-09-09 ENCOUNTER — Ambulatory Visit (INDEPENDENT_AMBULATORY_CARE_PROVIDER_SITE_OTHER): Payer: Medicare Other | Admitting: Family Medicine

## 2014-09-09 ENCOUNTER — Ambulatory Visit: Payer: Medicare Other | Admitting: Family Medicine

## 2014-09-09 VITALS — BP 159/74 | HR 75 | Temp 97.9°F | Ht 62.0 in | Wt 166.0 lb

## 2014-09-09 DIAGNOSIS — N390 Urinary tract infection, site not specified: Secondary | ICD-10-CM

## 2014-09-09 LAB — POCT URINALYSIS DIPSTICK
BILIRUBIN UA: NEGATIVE
Blood, UA: NEGATIVE
GLUCOSE UA: NEGATIVE
PH UA: 6
Spec Grav, UA: >=1.03
Urobilinogen, UA: 0.2

## 2014-09-09 MED ORDER — SULFAMETHOXAZOLE-TMP DS 800-160 MG PO TABS: 1.0000 | ORAL_TABLET | Freq: Two times a day (BID) | ORAL | Status: DC

## 2014-09-09 NOTE — Addendum Note (Signed)
Addended by: Aggie Hacker A on: 09/09/2014 05:15 PM   Modules accepted: Orders

## 2014-09-09 NOTE — Progress Notes (Signed)
   Subjective:    Patient ID: Alexa [name], female    DOB: [birth year], 78 y.o.   MRN: [id number]  HPI Here for 3 days of bilateral lower abdominal pain. She has no other sx, no urinary urgency or burning, no change in BMs, no nausea or fever. She had a UTI in July which grew Group B Strep, and she was treated with Cipro. She is not sure if this was ever cleared or not.    Review of Systems  Constitutional: Negative.   Respiratory: Negative.   Cardiovascular: Negative.   Gastrointestinal: Positive for abdominal pain. Negative for nausea, vomiting, diarrhea, constipation, blood in stool, abdominal distention and rectal pain.  Genitourinary: Negative.        Objective:   Physical Exam  Constitutional: She appears well-developed and well-nourished. No distress.  Cardiovascular: Normal rate, regular rhythm, normal heart sounds and intact distal pulses.   Pulmonary/Chest: Effort normal and breath sounds normal.  Abdominal: Soft. Bowel sounds are normal. She exhibits no distension and no mass. There is no tenderness. There is no rebound and no guarding.          Assessment & Plan:  Treat with Bactrim DS. Drink plenty of water. Await culture results.

## 2014-09-09 NOTE — Progress Notes (Signed)
Pre visit review using our clinic review tool, if applicable. No additional management support is needed unless otherwise documented below in the visit note. 

## 2014-09-10 ENCOUNTER — Ambulatory Visit: Payer: Medicare Other | Admitting: Family Medicine

## 2014-09-10 LAB — URINE CULTURE
Colony Count: NO GROWTH
Organism ID, Bacteria: NO GROWTH

## 2014-09-13 ENCOUNTER — Ambulatory Visit (INDEPENDENT_AMBULATORY_CARE_PROVIDER_SITE_OTHER): Payer: Medicare Other | Admitting: Emergency Medicine

## 2014-09-13 ENCOUNTER — Encounter: Payer: Self-pay | Admitting: Emergency Medicine

## 2014-09-13 ENCOUNTER — Other Ambulatory Visit: Payer: Self-pay

## 2014-09-13 VITALS — BP 155/86 | HR 72 | Temp 98.6°F | Ht 65.0 in | Wt 170.4 lb

## 2014-09-13 DIAGNOSIS — C3491 Malignant neoplasm of unspecified part of right bronchus or lung: Secondary | ICD-10-CM

## 2014-09-13 DIAGNOSIS — I2699 Other pulmonary embolism without acute cor pulmonale: Secondary | ICD-10-CM

## 2014-09-13 NOTE — Patient Instructions (Addendum)
We will not change any of your medications at this time.  Walking oximetry today  Follow with Dr Lamonte Sakai in 6 months or sooner if you have any problems

## 2014-09-13 NOTE — Assessment & Plan Note (Signed)
With IVC filter but did not complete her anti coag regimen due to SDH. At this point she is doing well. I believe the risk benefit favors holding anticoagulation. If she has another DVT then we would have to reconsider.

## 2014-09-13 NOTE — Progress Notes (Signed)
   Subjective:    Patient ID: Alexa [name], female    DOB: January 05, 1936, 78 y.o.   MRN: [id number]  \ Shortness of Breath Associated symptoms include leg swelling. Pertinent negatives include no ear pain, fever, headaches, rash, rhinorrhea, sore throat, vomiting or wheezing.   78 yo woman, former smoker, remote breast CA, s/p RUL lobectomy for 1B adenoCA 6/'14, HTN with diastolic dysfxn. She has had her diuretics adjusted to optimize volume status. Her PFT in 6/'14 (pre-op) did not show significant obstruction. She is not on BD's. She describes exertional SOB, somewhat better since her cardiac meds have been adjusted and she has been recovering from surgery. No cough, no wheeze.   ROV 03/21/14 -- follows up for multifactorial dyspnea. She unfortunately sustained a large PE, NSTEMI, SAH on recent admission 3/30-4/6. She was not anticoagulated and an IVC filter was placed. Repeat PFT 01/10/14 showed no significant obstruction beyond some curve to the flow volume loop. She was in the ED 4/22 with dyspnea. Her family had seen cyanosis on her fingers.   ROV 09/13/14 -- follow up visit for dyspnea with hx of PE, CAD and NSTEMI, IVCFilter. She has exertional SOB. She has a UTI and some abdominal pain. She has not had any more issues with DVT or PE, she has not had any focal neuro sx.    Review of Systems  Constitutional: Negative for fever and unexpected weight change.  HENT: Negative for congestion, dental problem, ear pain, nosebleeds, postnasal drip, rhinorrhea, sinus pressure, sneezing, sore throat and trouble swallowing.   Eyes: Negative for redness and itching.  Respiratory: Positive for shortness of breath. Negative for cough, chest tightness and wheezing.   Cardiovascular: Positive for leg swelling. Negative for palpitations.  Gastrointestinal: Negative for nausea and vomiting.  Genitourinary: Negative for dysuria.  Musculoskeletal: Negative for joint swelling.  Skin: Negative for rash.   Neurological: Negative for headaches.  Hematological: Bruises/bleeds easily.  Psychiatric/Behavioral: Negative for dysphoric mood. The patient is not nervous/anxious.        Objective:   Physical Exam Filed Vitals:   09/13/14 1644  BP: 155/86  Pulse: 72  Temp: 98.6 F (37 C)  TempSrc: Oral  Height: 5\' 5"  (1.651 m)  Weight: 170 lb 6.4 oz (77.293 kg)  SpO2: 98%   Gen: Pleasant, well-nourished, in no distress,  normal affect  ENT: No lesions,  mouth clear,  oropharynx clear, no postnasal drip  Neck: No JVD, no TMG, no carotid bruits  Lungs: No use of accessory muscles, no dullness to percussion, clear without rales or rhonchi  Cardiovascular: RRR, heart sounds normal, no murmur or gallops, no LE edema  Musculoskeletal: No deformities, no cyanosis or clubbing  Neuro: alert, non focal  Skin: Warm, no lesions or rashes      Assessment & Plan:  Acute pulmonary embolism With IVC filter but did not complete her anti coag regimen due to SDH. At this point she is doing well. I believe the risk benefit favors holding anticoagulation. If she has another DVT then we would have to reconsider.   Adenocarcinoma of right lung Stable. Following with Dr Marin Olp.

## 2014-09-13 NOTE — Assessment & Plan Note (Signed)
Stable. Following with Dr Marin Olp.

## 2014-09-16 ENCOUNTER — Encounter: Payer: Self-pay | Admitting: *Deleted

## 2014-09-17 ENCOUNTER — Ambulatory Visit (INDEPENDENT_AMBULATORY_CARE_PROVIDER_SITE_OTHER): Payer: Medicare Other | Admitting: Cardiology

## 2014-09-17 ENCOUNTER — Encounter: Payer: Self-pay | Admitting: Cardiology

## 2014-09-17 VITALS — BP 142/74 | HR 67 | Ht 65.0 in | Wt 167.0 lb

## 2014-09-17 DIAGNOSIS — I519 Heart disease, unspecified: Secondary | ICD-10-CM

## 2014-09-17 DIAGNOSIS — I480 Paroxysmal atrial fibrillation: Secondary | ICD-10-CM

## 2014-09-17 DIAGNOSIS — I5189 Other ill-defined heart diseases: Secondary | ICD-10-CM

## 2014-09-17 DIAGNOSIS — I5032 Chronic diastolic (congestive) heart failure: Secondary | ICD-10-CM

## 2014-09-17 IMAGING — CT CT ABD-PELV W/ CM
2 of 5 series · 17 of 46 positions shown, 19 images · IV contrast (Omnipaque 300)
Comparison: CT chest of 02/14/2014 and PET-CT of 04/09/2013

CLINICAL DATA: Left lower quadrant pain for several weeks, history
of partial right lung resection for lung carcinoma, also prior
history of breast carcinoma

EXAM:
CT ABDOMEN AND PELVIS WITH CONTRAST
TECHNIQUE: Multidetector CT imaging of the abdomen and pelvis was performed
using the standard protocol following bolus administration of
intravenous contrast.
CONTRAST:  100mL OMNIPAQUE IOHEXOL 300 MG/ML  SOLN

[Series 2: abd/ pel 5mm · axial · 0.70mm/px · z∈[-368,+56]mm · 14 of 95 slices shown, 16 images]
[im 5/95  soft-tissue]
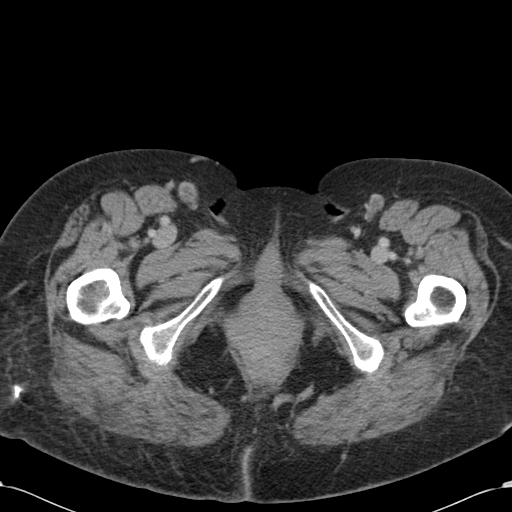
[im 5/95  bone]
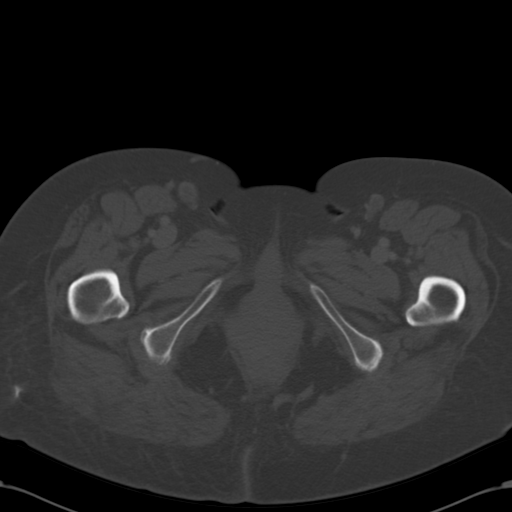
[im 15/95  soft-tissue]
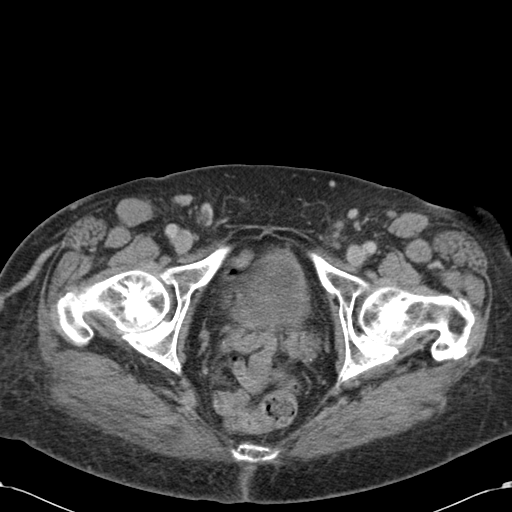
[im 19/95  soft-tissue]
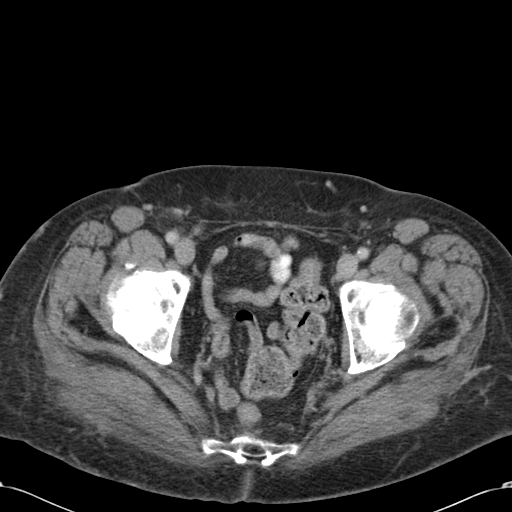
[im 24/95  soft-tissue]
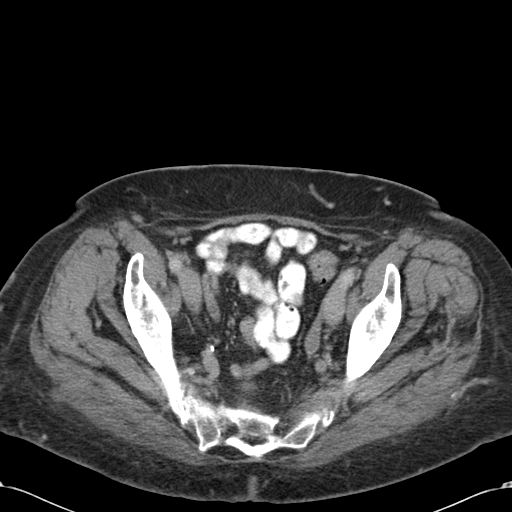
[im 33/95  soft-tissue]
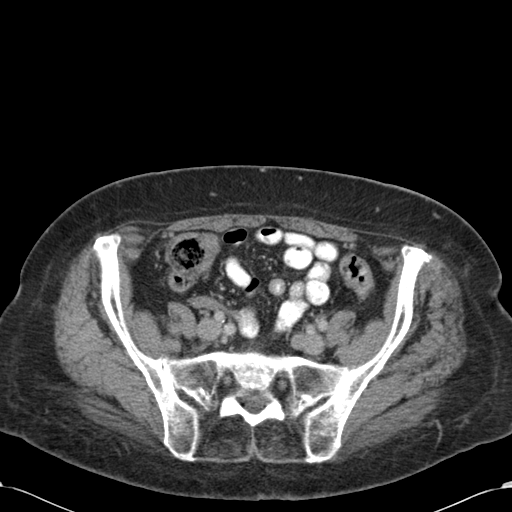
[im 38/95  soft-tissue]
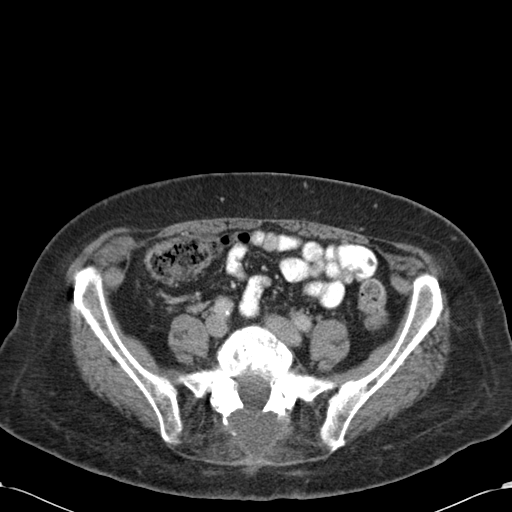
[im 43/95  soft-tissue]
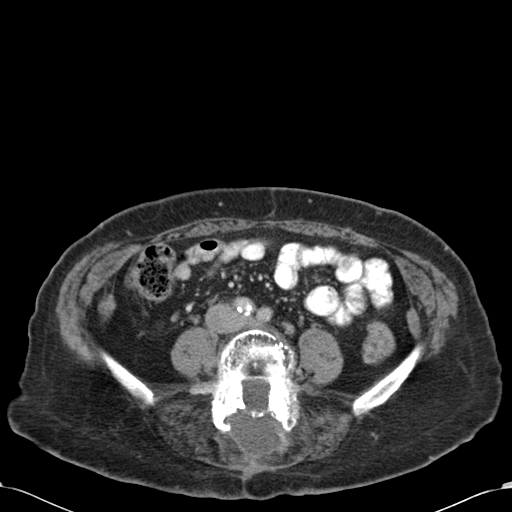
[im 52/95  soft-tissue]
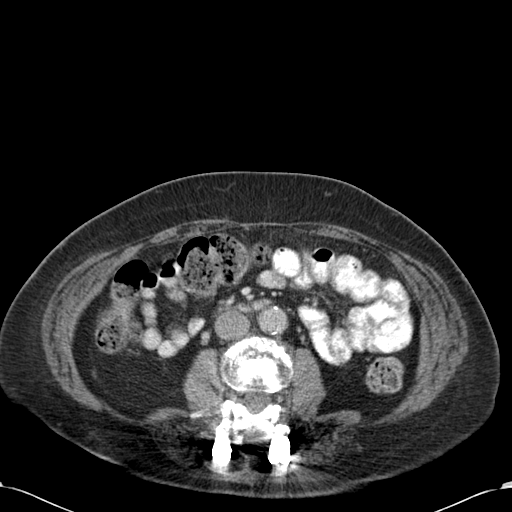
[im 57/95  soft-tissue]
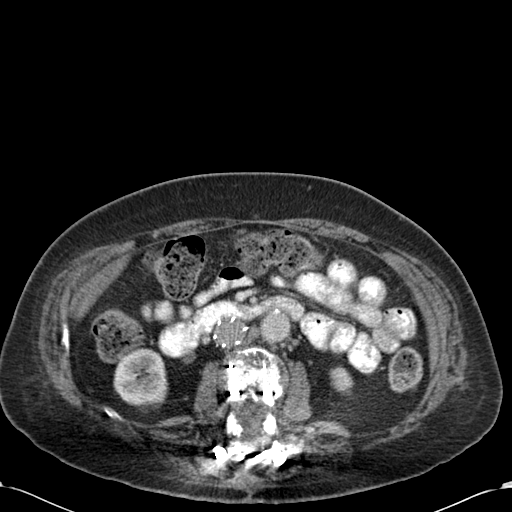
[im 57/95  bone]
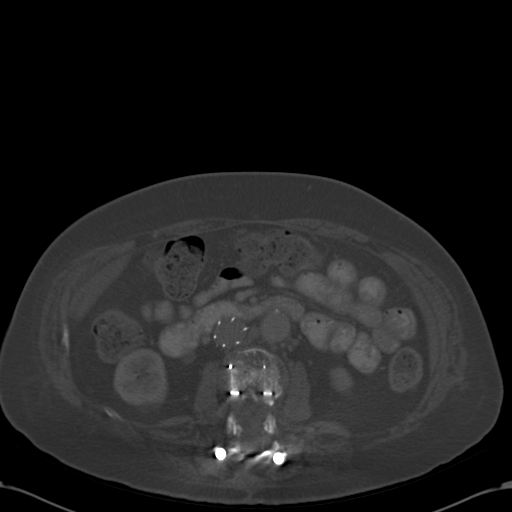
[im 62/95  soft-tissue]
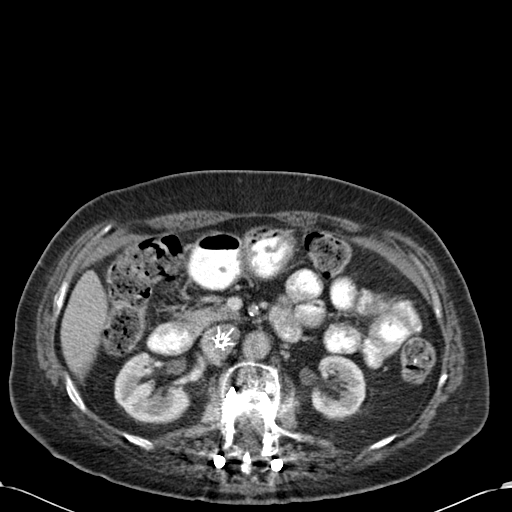
[im 71/95  soft-tissue]
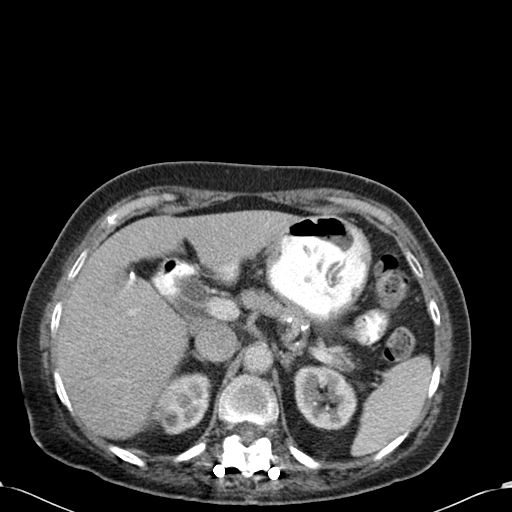
[im 76/95  soft-tissue]
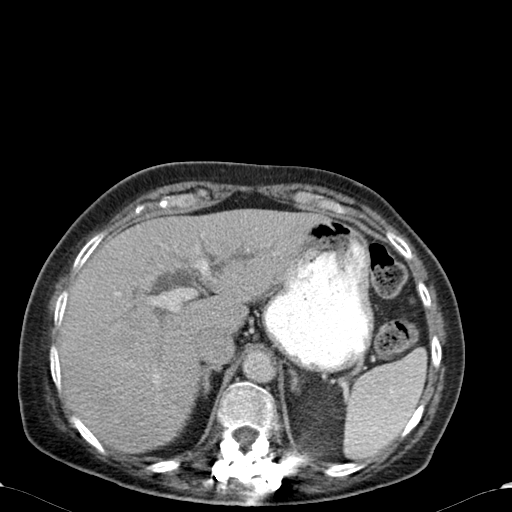
[im 80/95  soft-tissue]
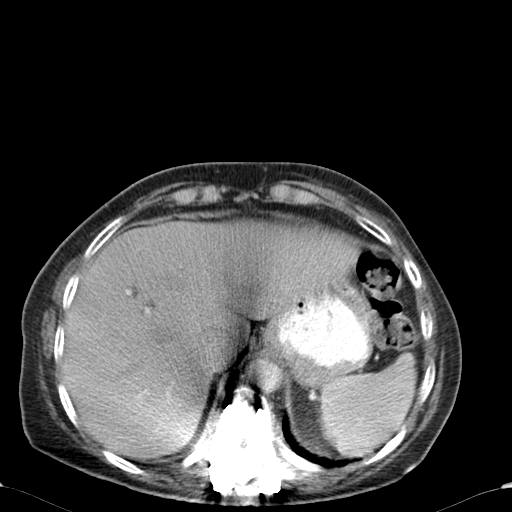
[im 90/95  soft-tissue]
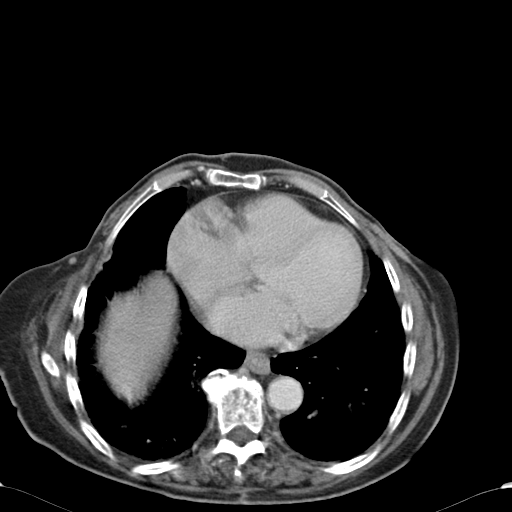

[Series 602: <mpr range> · coronal · 0.95mm/px · 3 of 125 slices shown]
[im 42/125  soft-tissue]
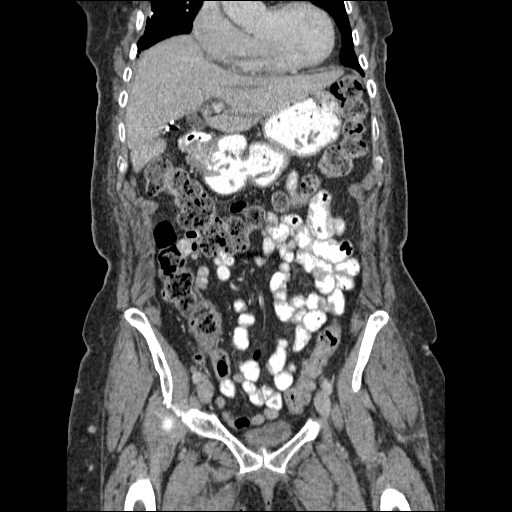
[im 56/125  soft-tissue]
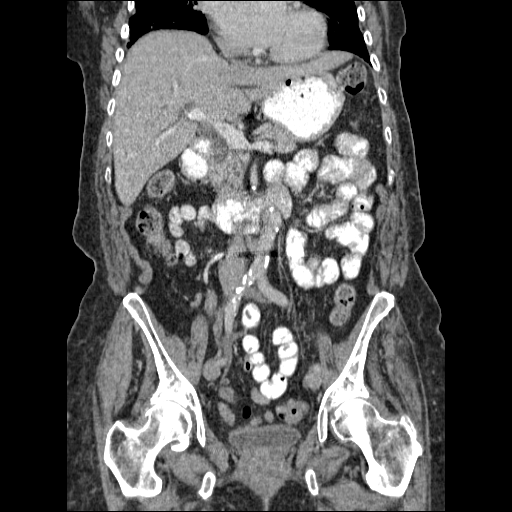
[im 69/125  soft-tissue]
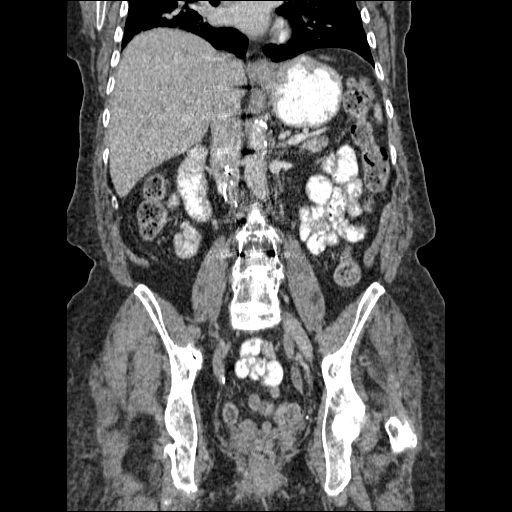

[17 of 46 positions shown; findings below may reference images not displayed]

FINDINGS: Linear scarring is noted at the anterior right lung base. No pleural
effusion is seen. The liver enhances with no evidence of metastatic
involvement. Surgical clips are present from prior cholecystectomy.
The common bile duct is prominent, most likely normal post
cholecystectomy. The pancreas is normal in size and the pancreatic
duct is not dilated. The adrenal glands and spleen are unremarkable.
The stomach is filled with contrast and food debris with no
abnormality noted. The kidneys enhance with no calculus or mass and
on delayed images, the pelvocaliceal systems are unremarkable. The
abdominal aorta is normal in caliber with moderate atheromatous
change present. An IVC filter is noted. No adenopathy is seen.

The urinary bladder is not well distended but no significant
abnormality is seen. The uterus has previously been resected. No
adnexal lesion is seen and no fluid is noted within the pelvis.
There are a few scattered rectosigmoid colon diverticula present but
no diverticulitis is seen. No colonic edema is seen. The terminal
ileum is unremarkable. Hardware for posterior fusion from T10 the L4
is noted with prior fusion of L5-S1.
IMPRESSION: 1. There are a few scattered rectosigmoid colon diverticula but no
diverticulitis is seen. No mucosal edema of the colon is noted.
2. Hardware for fusion from T10-L4.

## 2014-09-17 NOTE — Patient Instructions (Signed)
Use graded compression stockings to help the swelling in your feet and legs. Put them on in the morning and take them off when you go to bed at night.  Your physician wants you to follow-up in: 6 months with Dr Aundra Dubin. (April 2016). You will receive a reminder letter in the mail two months in advance. If you don't receive a letter, please call our office to schedule the follow-up appointment.

## 2014-09-18 NOTE — Progress Notes (Signed)
Patient ID: Alexa [name], female   DOB: 18-Jan-1936, 78 y.o.   MRN: [id number]  Subjective:    Patient ID: Alexa [name], female    DOB: 08/14/1936, 78 y.o.   MRN: [id number] PCP: Dr. Sarajane Jews  78 yo with history of diastolic CHF, dementia, paroxysmal atrial fibrillation, DVT/PE, and lung cancer presents for cardiology followup.  She had paroxysmal atrial fibrillation in the setting of her right upper lobectomy in 2014.  She was on amiodarone afterwards but stopped it and is not sure why.    On 02/25/14, she was admitted with dyspnea and headache.  She was found to have a submassive PE with RV strain but also a traumatic subarachnoid hemorrhage from a fall.  TnI went up to 1.0.  It was decided not to anticoagulate her given the [facility].  Echo during this admission showed EF 55-60% with severely dilated RV and PASP 61 mmHg.  She received an IVC filter. She had a repeat echo in 7/15 with EF 27-78%, PA systolic pressure 59 mmHg, moderate TR, normal RV size and systolic function.  She had recent visit with Dr Lamonte Sakai, oxygen saturation was ok.  They decided to hold off on anticoagulation.   Weight is down 2 lbs.  She does not get short of breath walking on flat ground.  Mild dyspnea with steps.  She walks in stores with no difficulty.  No chest pain, lightheadedness, tachypalpitations, orthopnea/PND.    Labs (9/12): K 4, creatinine 0.9, BNP 82, LDL 163, HDL 53, TGs 80 Labs (11/12): K 3.8, creatinine 0.91 Labs (12/14): K 3.8, creatinine 1.2 Labs (6/15): K 4.3, creatinine 1.3, TSH < 0.04, HCT 37.9 Labs (9/15): K 4.8, creatinine 1.0, HCT 37.2, AST/ALT elevated, HCT 37.2  PMH: 1. hypertension 2. Hyperlipidemia: Myalgias with multiple statins.  3. hypothyroidism 4. GERD 5. history of tobacco abuse; quit 25+ years ago.  PFTs in 2/15 were relatively normal.  5. Breast adenocarcinoma s/p right simple and left modified radical mastectomy with chemotherapy and radiation in 2002.  6. cervical and lumbar  herniation and DJD s/p multiple diskectomies and other back surgeries from 2002-2008 - chronic back pain being followed at the pain clinic 7. Diastolic CHF: Echo (2/42) with EF 60-65%, mild MR, moderate TR, PA systolic pressure 48 mmHg.  ETT-myoview (9/12) with 4' exercise, EF 68%, no ischemia or infarction.  Echo (101/4) with EF 55-60%, moderate diastolic dysfunction, PA systolic pressure 35-36 mmHg, normal RV size and systolic function.  Echo (3/15) with EF 55-60%, severely dilated RV with PA systolic pressure 61 mmHg, moderate to severe TR, dilated IVC.  8. Venous insufficiency with venous stasis ulceration.  9. VTE: DVT right lower leg 11/12.  Submassive PE 3/15.   10. Paroxysmal atrial fibrillation: Only episode that noted was in setting of right upper lobectomy in 2014.  She was briefly on amiodarone but stopped it on her own.  She is not coumadin.  11. Non-small cell lung cancer: s/p right upper lobectomy in 2014.  12. Dementia: On Aricept.  FHx: Mother died of stroke at age 20. Father died of heart attack at age 61. Sister had breast cancer.  SH:  Prior smoker. Lives in [place] with family.   ROS: All systems reviewed and negative except as per HPI.   Current Outpatient Prescriptions  Medication Sig Dispense Refill  . donepezil (ARICEPT) 10 MG tablet Take 1 tablet (10 mg total) by mouth at bedtime.  90 tablet  1  . furosemide (LASIX) 40 MG tablet Take  40 mg by mouth daily.       Marland Kitchen levothyroxine (SYNTHROID, LEVOTHROID) 137 MCG tablet Take 137 mcg by mouth daily before breakfast.      . morphine (MSIR) 30 MG tablet Take 30 mg by mouth 2 (two) times daily.       Marland Kitchen oxyCODONE-acetaminophen (PERCOCET) 10-325 MG per tablet Take 1 tablet by mouth every 4 (four) hours as needed (pain).      . potassium chloride SA (K-DUR,KLOR-CON) 20 MEQ tablet Take 20 mEq by mouth daily.      Marland Kitchen sulfamethoxazole-trimethoprim (BACTRIM DS) 800-160 MG per tablet Take 1 tablet by mouth 2 (two) times daily.   14 tablet  0  . Tavaborole (KERYDIN) 5 % SOLN Apply 1 drop each affected toenails once daily for 12 month  10 mL  6  . temazepam (RESTORIL) 30 MG capsule Take 30 mg by mouth daily.      . valsartan (DIOVAN) 320 MG tablet Take 1 tablet (320 mg total) by mouth daily.  30 tablet  3   No current facility-administered medications for this visit.    BP 142/74  Pulse 67  Ht 5\' 5"  (1.651 m)  Wt 167 lb (75.751 kg)  BMI 27.79 kg/m2 General: NAD, overweight Neck: JVP 7 cm, no thyromegaly or thyroid nodule.  Lungs: Clear to auscultation bilaterally with normal respiratory effort. CV: Nondisplaced PMI.  Heart regular S1/S2, no S3/S4, no murmur.  1+ edema to knees bilaterally. Bilateral lower leg venous varicosities.  No carotid bruit.   Abdomen: Soft, nontender, no hepatosplenomegaly, no distention.  Neurologic: Alert and oriented x 3.  Psych: Anxious Extremities: No clubbing or cyanosis.   Assessment/Plan: 1. Exertional dyspnea: NYHA class II symptoms, relatively stable.  Volume status looks ok.  Last echo in 7/15 showed moderate pulmonary hypertension but the RV was normal in size and systolic function (improved).  - Continue current Lasix, wear compression stockings for lower extremity edema.    2. Atrial fibrillation: 1 documented episode post-op lobectomy.  No tachypalpitations.  She is no longer taking amiodarone.  3. HTN: BP controlled on valsartan.  4. PE: PE in 3/15 was not triggered by anything in particular and was submassive.  She also fell and had a traumatic subarachnoid hemorrhage.  Therefore, she had an IVC filter placed.  - She saw Dr Lamonte Sakai recently and they decided to hold off anticoagulation unless there is a recurrent DVT.  - Repeat echo in 7/15 showed improvement in RV size and function.  Given improved RV appearance and minimal symptoms, would hold off on RHC at this point.  If dyspnea recurs, will need workup for chronic thromboembolic pulmonary hypertension.   Loralie Champagne 09/18/2014

## 2014-09-23 ENCOUNTER — Encounter: Payer: Self-pay | Admitting: Family Medicine

## 2014-09-23 ENCOUNTER — Ambulatory Visit (INDEPENDENT_AMBULATORY_CARE_PROVIDER_SITE_OTHER): Payer: Medicare Other | Admitting: Family Medicine

## 2014-09-23 VITALS — BP 156/73 | HR 67 | Temp 98.4°F | Ht 65.0 in | Wt 170.0 lb

## 2014-09-23 DIAGNOSIS — N390 Urinary tract infection, site not specified: Secondary | ICD-10-CM

## 2014-09-23 DIAGNOSIS — R319 Hematuria, unspecified: Secondary | ICD-10-CM

## 2014-09-23 LAB — POCT URINALYSIS DIPSTICK
BILIRUBIN UA: NEGATIVE
GLUCOSE UA: NEGATIVE
Ketones, UA: NEGATIVE
Nitrite, UA: NEGATIVE
Protein, UA: NEGATIVE
Spec Grav, UA: 1.025
Urobilinogen, UA: 0.2
pH, UA: 6

## 2014-09-23 MED ORDER — SULFAMETHOXAZOLE-TMP DS 800-160 MG PO TABS: 1.0000 | ORAL_TABLET | Freq: Two times a day (BID) | ORAL | Status: DC

## 2014-09-23 NOTE — Progress Notes (Signed)
   Subjective:    Patient ID: Alexa [name], female    DOB: [birth year], 78 y.o.   MRN: [id number]  HPI Here to follow up a UTI. She was here 2 weeks ago with lower abdominal pain and bacteriuria. She took a 7 day course of Bactrim DS and she felt better. The pains are much improved but she still has some mild urgency. We attempted a culture on the last sample but nothing grew in the lab.    Review of Systems  Constitutional: Negative.   Gastrointestinal: Positive for abdominal pain. Negative for nausea, vomiting, diarrhea, constipation, blood in stool and abdominal distention.  Genitourinary: Positive for urgency. Negative for dysuria, frequency, hematuria and flank pain.       Objective:   Physical Exam  Constitutional: She appears well-developed and well-nourished.  Abdominal: Soft. Bowel sounds are normal. She exhibits no distension and no mass. There is no tenderness. There is no rebound and no guarding.          Assessment & Plan:  Partially treated UTI. Treat with an additional 14 days of Bactrim DS and then recheck a week after this is finished.

## 2014-09-23 NOTE — Addendum Note (Signed)
Addended by: Aggie Hacker A on: 09/23/2014 03:54 PM   Modules accepted: Orders

## 2014-09-23 NOTE — Progress Notes (Signed)
Pre visit review using our clinic review tool, if applicable. No additional management support is needed unless otherwise documented below in the visit note. 

## 2014-09-24 ENCOUNTER — Encounter: Payer: Medicare Other | Admitting: Podiatry

## 2014-09-24 NOTE — Progress Notes (Signed)
   Subjective:    Patient ID: Alexa [name], female    DOB: August 10, 1936, 78 y.o.   MRN: [id number]  HPI  Pt presents for nail debridement   Review of Systems     Objective:   Physical Exam        Assessment & Plan:

## 2014-10-01 ENCOUNTER — Ambulatory Visit: Payer: Medicare Other

## 2014-10-02 ENCOUNTER — Other Ambulatory Visit: Payer: Self-pay | Admitting: *Deleted

## 2014-10-02 DIAGNOSIS — C3491 Malignant neoplasm of unspecified part of right bronchus or lung: Secondary | ICD-10-CM

## 2014-10-03 ENCOUNTER — Ambulatory Visit (HOSPITAL_BASED_OUTPATIENT_CLINIC_OR_DEPARTMENT_OTHER): Payer: Medicare Other | Admitting: Hematology & Oncology

## 2014-10-03 ENCOUNTER — Other Ambulatory Visit (HOSPITAL_BASED_OUTPATIENT_CLINIC_OR_DEPARTMENT_OTHER): Payer: Medicare Other | Admitting: Lab

## 2014-10-03 ENCOUNTER — Encounter: Payer: Self-pay | Admitting: Hematology & Oncology

## 2014-10-03 VITALS — BP 141/72 | HR 65 | Temp 98.0°F | Resp 14 | Ht 65.0 in | Wt 169.0 lb

## 2014-10-03 DIAGNOSIS — Z853 Personal history of malignant neoplasm of breast: Secondary | ICD-10-CM

## 2014-10-03 DIAGNOSIS — C3491 Malignant neoplasm of unspecified part of right bronchus or lung: Secondary | ICD-10-CM

## 2014-10-03 DIAGNOSIS — Z85118 Personal history of other malignant neoplasm of bronchus and lung: Secondary | ICD-10-CM

## 2014-10-03 LAB — CBC WITH DIFFERENTIAL (CANCER CENTER ONLY)
BASO#: 0 10*3/uL (ref 0.0–0.2)
BASO%: 0.2 % (ref 0.0–2.0)
EOS%: 2.8 % (ref 0.0–7.0)
Eosinophils Absolute: 0.1 10*3/uL (ref 0.0–0.5)
HCT: 36.7 % (ref 34.8–46.6)
HGB: 12.2 g/dL (ref 11.6–15.9)
LYMPH#: 1.3 10*3/uL (ref 0.9–3.3)
LYMPH%: 28.1 % (ref 14.0–48.0)
MCH: 29.2 pg (ref 26.0–34.0)
MCHC: 33.2 g/dL (ref 32.0–36.0)
MCV: 88 fL (ref 81–101)
MONO#: 0.4 10*3/uL (ref 0.1–0.9)
MONO%: 8.6 % (ref 0.0–13.0)
NEUT#: 2.8 10*3/uL (ref 1.5–6.5)
NEUT%: 60.3 % (ref 39.6–80.0)
Platelets: 281 10*3/uL (ref 145–400)
RBC: 4.18 10*6/uL (ref 3.70–5.32)
RDW: 13.2 % (ref 11.1–15.7)
WBC: 4.7 10*3/uL (ref 3.9–10.0)

## 2014-10-03 LAB — CMP (CANCER CENTER ONLY)
ALBUMIN: 3.5 g/dL (ref 3.3–5.5)
ALK PHOS: 108 U/L — AB (ref 26–84)
ALT(SGPT): 11 U/L (ref 10–47)
AST: 14 U/L (ref 11–38)
BUN, Bld: 19 mg/dL (ref 7–22)
CO2: 27 meq/L (ref 18–33)
Calcium: 8.9 mg/dL (ref 8.0–10.3)
Chloride: 100 mEq/L (ref 98–108)
Creat: 1.4 mg/dl — ABNORMAL HIGH (ref 0.6–1.2)
GLUCOSE: 87 mg/dL (ref 73–118)
POTASSIUM: 3.5 meq/L (ref 3.3–4.7)
Sodium: 140 mEq/L (ref 128–145)
TOTAL PROTEIN: 7.5 g/dL (ref 6.4–8.1)
Total Bilirubin: 0.6 mg/dl (ref 0.20–1.60)

## 2014-10-03 NOTE — Progress Notes (Signed)
Hematology and Oncology Follow Up Visit  Alexa [name] [id number] 12-14-1935 78 y.o. 10/03/2014   Principle Diagnosis:  Stage IB ( T2a N0M0) adenocarcinoma of the right lung Stage 1 (T1N0M0) ductal carcinoma of the LEFT breast -ER+/HER2 -. Stage 1 (T1N0M0) ER+/HER2- ductal ca of RIGHT breast Pulmonary embolism-diagnosed in April of 2015  Current Therapy:    Observation     Interim History:  Ms.  [name] is back for followup  Saw her back in early July. Since then, she's been doing okay. She did have this abdominal pain. She underwent scans. She had a CT scan of the abdomen and pelvis in early August. This is pretty much unremarkable. There is no evidence of metastatic disease.  She had Doppler of her legs in late August. There is no thromboembolic disease in the legs.  She does have a filter in place.  She's had no cough. She's had no weight loss weight gain. She's had no change in bowel or bladder habits. She's had no rashes. She is off anticoagulation. Again, this is why she has the IVC filter in place.  Medications: Current outpatient prescriptions: donepezil (ARICEPT) 10 MG tablet, Take 1 tablet (10 mg total) by mouth at bedtime., Disp: 90 tablet, Rfl: 1;  furosemide (LASIX) 40 MG tablet, Take 40 mg by mouth daily. , Disp: , Rfl: ;  levothyroxine (SYNTHROID, LEVOTHROID) 137 MCG tablet, Take 137 mcg by mouth daily before breakfast., Disp: , Rfl: ;  morphine (MSIR) 30 MG tablet, Take 30 mg by mouth 2 (two) times daily. , Disp: , Rfl:  oxyCODONE-acetaminophen (PERCOCET) 10-325 MG per tablet, Take 1 tablet by mouth every 4 (four) hours as needed (pain)., Disp: , Rfl: ;  potassium chloride SA (K-DUR,KLOR-CON) 20 MEQ tablet, Take 20 mEq by mouth daily., Disp: , Rfl:  sulfamethoxazole-trimethoprim (BACTRIM DS) 800-160 MG per tablet, Take 1 tablet by mouth 2 (two) times daily. (Patient taking differently: Take 1 tablet by mouth 2 (two) times daily. D/C 10-05-14), Disp: 28 tablet, Rfl: 0;   Tavaborole (KERYDIN) 5 % SOLN, Apply 1 drop each affected toenails once daily for 12 month, Disp: 10 mL, Rfl: 6;  temazepam (RESTORIL) 30 MG capsule, Take 30 mg by mouth daily., Disp: , Rfl:  valsartan (DIOVAN) 320 MG tablet, Take 1 tablet (320 mg total) by mouth daily., Disp: 30 tablet, Rfl: 3  Allergies:  Allergies  Allergen Reactions  . Simvastatin Other (See Comments)    myalgia  . Statins Other (See Comments)    Myalgias with multiple statins   . Strawberry Swelling    Swelling ,rash  . Hydrocodone-Homatropine Nausea Only  . Nsaids Other (See Comments)    unknown  . Penicillins Rash    Past Medical History, Surgical history, Social history, and Family History were reviewed and updated.  Review of Systems: As above  Physical Exam:  height is $RemoveB'5\' 5"'OCelmveM$  (1.651 m) and weight is 169 lb (76.658 kg). Her oral temperature is 98 F (36.7 C). Her blood pressure is 141/72 and her pulse is 65. Her respiration is 14.    Elderly white female. Head and neck exam shows no ocular or oral lesion. She has no palpable cervical or supraclavicular nodes. Lungs are with some slight decrease at the bases. Cardiac exam regular in with a 1/6 systolic ejection murmur. Breast exam showed bilateral mastectomies. No chest wall nodules noted. Is no bilateral axillary adenopathy. Abdomen is soft. She has good bowel sounds. There is no palpable dullness. There is no palpable  liver or spleen tip. Back exam shows at the proximal end of her laminectomy scar, a subcutaneous nodule. This is on the left side of the spine. It is firm. It is slightly mobile. Upon measures about 1 -1.5 cm. It is slightly tender to touch. No other abnormalities noted on back exam. The laminectomy scar is well-healed. Extremities shows no clubbing cyanosis or edema. No obvious venous cords noted in the left leg. Skin exam no rashes. Neurological exam shows no focal neurological deficits.   Lab Results  Component Value Date   WBC 4.7  10/03/2014   HGB 12.2 10/03/2014   HCT 36.7 10/03/2014   MCV 88 10/03/2014   PLT 281 10/03/2014     Chemistry      Component Value Date/Time   NA 140 10/03/2014 0844   NA 143 08/08/2014 1107   NA 145 02/14/2014 1123   K 3.5 10/03/2014 0844   K 4.8 08/08/2014 1107   K 3.7 02/14/2014 1123   CL 100 10/03/2014 0844   CL 107 08/08/2014 1107   CO2 27 10/03/2014 0844   CO2 30 08/08/2014 1107   CO2 28 02/14/2014 1123   BUN 19 10/03/2014 0844   BUN 16 08/08/2014 1107   BUN 19.7 02/14/2014 1123   CREATININE 1.4* 10/03/2014 0844   CREATININE 1.0 08/08/2014 1107   CREATININE 1.0 02/14/2014 1123      Component Value Date/Time   CALCIUM 8.9 10/03/2014 0844   CALCIUM 9.0 08/08/2014 1107   CALCIUM 9.6 02/14/2014 1123   ALKPHOS 108* 10/03/2014 0844   ALKPHOS 147* 08/08/2014 1107   ALKPHOS 104 02/14/2014 1123   AST 14 10/03/2014 0844   AST 137* 08/08/2014 1107   AST 22 02/14/2014 1123   ALT 11 10/03/2014 0844   ALT 63* 08/08/2014 1107   ALT 15 02/14/2014 1123   BILITOT 0.60 10/03/2014 0844   BILITOT 1.2 08/08/2014 1107   BILITOT 1.04 02/14/2014 1123         Impression and Plan: Alexa [name] is a 78 year old female. She's had multiple malignancies. So far, there is no evidence of recurrence of the lung cancer or breast cancer.  I think we can just watch her for right now. There really is now much that we have to do.I am glad that her CAT scans were negative. I'm glad that her Doppler does not show any thrombus.  Her liver function tests appear to have normalized. Not sure as to why they were abnormal a few months ago.  I will plan to see her back in probably 3 or 4 months.  Volanda Napoleon, MD 11/5/20155:49 PM

## 2014-10-04 ENCOUNTER — Other Ambulatory Visit: Payer: Self-pay | Admitting: Family Medicine

## 2014-10-04 LAB — D-DIMER, QUANTITATIVE (NOT AT ARMC): D-Dimer, Quant: 1.38 ug/mL-FEU — ABNORMAL HIGH (ref 0.00–0.48)

## 2014-10-07 NOTE — Telephone Encounter (Signed)
Call in #[phone] with 5 rf 

## 2014-10-08 ENCOUNTER — Ambulatory Visit: Payer: Medicare Other | Admitting: Neurology

## 2014-10-11 ENCOUNTER — Telehealth: Payer: Self-pay | Admitting: Gastroenterology

## 2014-10-11 NOTE — Telephone Encounter (Signed)
Patient with continued LLQ pain.  CT showed in August diverticulosis not diverticulitis. She has been treated for a UTI by her primary and just finished the antibiotics.  She was having terrible pain last night that had her in "tears". I have reviewed the results of the CT from August with the daughter and Tye Savoy RNP's recommendations.  She is given an appt for 10/15/14 2:30 with Tye Savoy RNP to further discuss colonoscopy, but she is advised today to contact her primary care about the lack of improvement in pain with UTI treatment.

## 2014-10-11 NOTE — Telephone Encounter (Signed)
Left message for patient to call back  

## 2014-10-14 ENCOUNTER — Ambulatory Visit (INDEPENDENT_AMBULATORY_CARE_PROVIDER_SITE_OTHER): Payer: Medicare Other | Admitting: Family Medicine

## 2014-10-14 ENCOUNTER — Other Ambulatory Visit: Payer: Medicare Other

## 2014-10-14 DIAGNOSIS — N390 Urinary tract infection, site not specified: Secondary | ICD-10-CM

## 2014-10-14 MED ORDER — CIPROFLOXACIN HCL 500 MG PO TABS: 500.0000 mg | ORAL_TABLET | Freq: Two times a day (BID) | ORAL | Status: DC

## 2014-10-14 NOTE — Progress Notes (Signed)
   Subjective:    Patient ID: Alexa [name], female    DOB: [birth year], 78 y.o.   MRN: [id number]  HPI She arrived late so there was no exam. She has UTI sx, so we will treat with Cipro and get a culture.    Review of Systems  Constitutional: Negative.        Objective:   Physical Exam  Constitutional: She appears well-developed and well-nourished.          Assessment & Plan:  Treat with Cipro.

## 2014-10-15 ENCOUNTER — Ambulatory Visit (INDEPENDENT_AMBULATORY_CARE_PROVIDER_SITE_OTHER): Payer: Medicare Other | Admitting: Nurse Practitioner

## 2014-10-15 ENCOUNTER — Encounter: Payer: Self-pay | Admitting: Nurse Practitioner

## 2014-10-15 VITALS — BP 130/84 | HR 64 | Ht 63.0 in | Wt 171.0 lb

## 2014-10-15 DIAGNOSIS — R112 Nausea with vomiting, unspecified: Secondary | ICD-10-CM

## 2014-10-15 MED ORDER — PROMETHAZINE HCL 12.5 MG PO TABS: 12.5000 mg | ORAL_TABLET | Freq: Two times a day (BID) | ORAL | Status: DC | PRN

## 2014-10-15 MED ORDER — DICYCLOMINE HCL 10 MG PO CAPS: 10.0000 mg | ORAL_CAPSULE | Freq: Two times a day (BID) | ORAL | Status: DC | PRN

## 2014-10-15 NOTE — Patient Instructions (Signed)
We have sent the following medications to your pharmacy for you to pick up at your convenience:  Phenergan  As discussed with Nevin Bloodgood, keep a diary of your pain so we can try to spot some triggers.  If your nausea and vomiting persist, please call Nevin Bloodgood to let her know.

## 2014-10-15 NOTE — Progress Notes (Signed)
     History of Present Illness:   Patient is a 78 year old female known to Dr. Fuller Plan. She has multiple medical problems including a history of breast and lung cancer. She is s/p IVC filter for history of PE. Please refer to my last office. I saw her early August for left lower quadrant pain. CT scan of the abdomen and pelvis was obtained and nondiagnostic.She is back with daughters, abdominal pain remains a problems. It migrates around the abdomen, can awake her from sleep but daughter knows of several occasions when pain occurred after mother had been working around house / in the yard. Pain not really related to eating, not necessarily related to or better after defecation. She saw PCP recently and urine still looks suspicious, despite several courses of antibiotics. Her weight is stable. Over the last 3-4 days she has been vomiting though none today. She has BMs, almost each meal but not having diarrhea.  Last colonoscopy March 2010 done for surveillance of polyps. Findings included mild diverticulosis of the sigmoid. A sessile polyp was removed from the ascending colon, exam otherwise normal. Polyp compatible with tubular adenoma.  Current Medications, Allergies, Past Medical History, Past Surgical History, Family History and Social History were reviewed in [employer] record.  Physical Exam: General: Pleasant, well developed , white female in no acute distress Head: Normocephalic and atraumatic Eyes:  sclerae anicteric, conjunctiva pink  Ears: Normal auditory acuity Lungs: Clear throughout to auscultation Heart: Regular rate and rhythm Abdomen: Soft, non distended, non-tender. No masses, no hepatomegaly. Normal bowel sounds Musculoskeletal: Symmetrical with no gross deformities  Extremities: No edema  Neurological: Alert oriented x 4, grossly nonfocal Psychological:  Alert and cooperative. Normal mood and affect  Assessment and Recommendations:  84. 78 year old  female with lower abdominal pain, initially it was LLQ but now migrates around lower abdomen. CTscan negative. No distinctt bowel habit changes. She has been battling UTIs but pain not really characteristic. IBS? Neuropathic? Musculoskeletal?  Abdominal exam not really concerning, she looks okay. Patient and family agreeable to trying bowel anti-spasmodic. Colonoscopies done for evaluation of pain are usually low yield though she is due for one anyway. Will see how she does with dicyclomine, family nor patient eager to proceed with colonoscopy right now.   2.  History of adenomatous colon polyps. She received a recall letter in January of this year. Currently patient and family feel she is not strong enough to undergo a colonoscopy.  3. Nausea and vomiting, new. Viral? Result of abdominal pain?  No vomiting today. If this persists family will call so we can proceed with workup. Daughter has given her 25mg  of Phenergan which helps but I would rather her take 12.5mg  because of sedating properties of medication.   2. Recurrent urinary tract infections,  PCP managing

## 2014-10-16 DIAGNOSIS — R109 Unspecified abdominal pain: Secondary | ICD-10-CM | POA: Insufficient documentation

## 2014-10-16 LAB — URINE CULTURE

## 2014-10-16 NOTE — Progress Notes (Signed)
Reviewed and agree with management plan.  Hakop Humbarger T. Darrin Apodaca, MD FACG 

## 2014-10-22 ENCOUNTER — Encounter: Payer: Self-pay | Admitting: *Deleted

## 2014-10-22 NOTE — [facility] Oncology Navigator Note (Signed)
I called patient to check in.  Patient at [facility] with her daughter who had surgery yesterday.  I spoke to her daughter who is her caregiver who reports that her altzheimer's disease is progressing.  She has no concerns related to her lung cancer but she has been having problems with abdominal pain and she is being treated for a UTI.  She asked if there was any assistance available to help with her medications.  I told her I would inquire about resources and let her know.  She had no other questions or concerns at this time.  We discussed that patient is scheduled for follow up visit at [facility] in March.

## 2014-10-28 ENCOUNTER — Other Ambulatory Visit: Payer: Self-pay | Admitting: Cardiovascular Disease

## 2014-10-30 ENCOUNTER — Ambulatory Visit (INDEPENDENT_AMBULATORY_CARE_PROVIDER_SITE_OTHER): Payer: Medicare Other | Admitting: Neurology

## 2014-10-30 ENCOUNTER — Ambulatory Visit: Payer: Medicare Other | Admitting: Neurology

## 2014-10-30 ENCOUNTER — Encounter: Payer: Self-pay | Admitting: Neurology

## 2014-10-30 VITALS — BP 110/60 | HR 62 | Temp 97.1°F | Ht 62.5 in | Wt 173.0 lb

## 2014-10-30 DIAGNOSIS — F0390 Unspecified dementia without behavioral disturbance: Secondary | ICD-10-CM

## 2014-10-30 NOTE — Progress Notes (Signed)
Reason for visit: Memory disorder  Alexa [name] is an 79 y.o. female  History of present illness:  Alexa [name] is a 78 year old white female with a history of a progressive dementing illness. The patient had a significant decline in her cognitive capacity in the spring of 2015 following a fall associated with head trauma and subarachnoid hemorrhage. The patient also had a DVT, and a pulmonary embolism around that time. The patient has returned to her usual baseline this point. The patient lives with 2 of her daughters. The patient has supervision 24/7. The patient does require some assistance with dressing at times. She is independent with bathing, and she can feed herself. The patient does not wander. At times, the may be some low-grade agitation. The patient is on Aricept, and she is tolerating the medication well. She returns for routine reevaluation.  Past Medical History  Diagnosis Date  . Hyperlipidemia     a. Myalgias with multiple statins.  . Hypertension   . Adenocarcinoma of breast     a. s/p right simple and left modified radical mastectomy with chemotherapy and radiation in 2002.   Marland Kitchen GERD (gastroesophageal reflux disease)   . Diverticulosis   . Hx of adenomatous colonic polyps   . DVT (deep venous thrombosis) 09-30-11    a. Right lower leg 09/2011.  . Arthritis   . Hypothyroidism   . Dementia     a. sees Dr. Brett Fairy. b. on Aricept.  Marland Kitchen History of kidney stones   . DDD (degenerative disc disease)     a. cervical and lumbar herniation and DJD s/p multiple diskectomies and other back surgeries from 2002-2008   . Asthma   . H/O vein stripping   . History of tobacco abuse     a. quit 25+ years ago. PFTs in 2/15 were relatively normal.   . Chronic diastolic CHF (congestive heart failure)     a. ETT-myoview (9/12) with 4' exercise, EF 68%, no ischemia or infarction. b. Echo (10/14) with EF 55-60%, mod diast dysfunction, normal RV size/fcn, mod pulm HTN.   . Venous  insufficiency     a. H/o venous stasis ulceration.   Marland Kitchen PAF (paroxysmal atrial fibrillation)     a. Only episode that noted was in setting of right upper lobectomy in 2014. She was briefly on amiodarone but stopped it on her own. She is not coumadin. If recurrent AF, will need to consider.  . Non-small cell lung cancer     a. s/p right upper lobectomy in 2014.   . Pulmonary HTN     a. Moderate by echo 08/2013.  Marland Kitchen Cerebral infarct     a.  Remote anterior left frontal lobe infarct by CT head 01/2014.  . PE (pulmonary embolism)   . Alzheimer's disease     Past Surgical History  Procedure Laterality Date  . Cholecystectomy  2007  . Breast surgery  2002    BL mastectomy  . Lumbar laminectomy      x3  . Rotator cuff repair Right 10  . Cervical spine surgery    . Hip surgery    . Spine surgery    . Abdominal hysterectomy    . Fiberoptic bronchoscopy with electromagnetic  10/07/2010  . Video bronchoscopy N/A 05/14/2013    Procedure: VIDEO BRONCHOSCOPY;  Surgeon: Grace Isaac, MD;  Location: [facility];  Service: Thoracic;  Laterality: N/A;  . Video assisted thoracoscopy (vats)/ lobectomy Right 05/14/2013    Procedure: VIDEO ASSISTED  THORACOSCOPY (VATS)/ LOBECTOMY;  Surgeon: Grace Isaac, MD;  Location: [place];  Service: Thoracic;  Laterality: Right;  . Colonoscopy  02-18-09    per Dr. Fuller Plan, adenomatous polyp, repeat in 5 yrs   . Neck-acdf    . Varicose vein surgery    . Hemorrhoid surgery  1985  . Kidney stone surgery  1987  . Vena cava filter placement  01/2014  . Lobectomy      Family History  Problem Relation Age of Onset  . Diabetes Daughter   . Diabetes Daughter   . Heart disease Father   . Heart disease Brother   . Dementia Brother   . Heart disease Sister   . Cancer Sister     breast  . Colon cancer Daughter 27  . Colon polyps Daughter   . Stroke Mother   . Liver disease Daughter   . Heart attack    . Cancer Sister     breast    Social history:  reports that  she quit smoking about 30 years ago. Her smoking use included Cigarettes. She started smoking about 55 years ago. She has a 25 pack-year smoking history. She has never used smokeless tobacco. She reports that she does not drink alcohol or use illicit drugs.    Allergies  Allergen Reactions  . Simvastatin Other (See Comments)    myalgia  . Statins Other (See Comments)    Myalgias with multiple statins   . Strawberry Swelling    Swelling ,rash  . Hydrocodone-Homatropine Nausea Only  . Nsaids Other (See Comments)    unknown  . Penicillins Rash    Medications:  Current Outpatient Prescriptions on File Prior to Visit  Medication Sig Dispense Refill  . ciprofloxacin (CIPRO) 500 MG tablet Take 1 tablet (500 mg total) by mouth 2 (two) times daily. 20 tablet 0  . dicyclomine (BENTYL) 10 MG capsule Take 1 capsule (10 mg total) by mouth 2 (two) times daily as needed for spasms. 60 capsule 0  . donepezil (ARICEPT) 10 MG tablet Take 1 tablet (10 mg total) by mouth at bedtime. 90 tablet 1  . furosemide (LASIX) 40 MG tablet TAKE 1/2 TABLET BY MOUTH TWICE A DAY 45 tablet 3  . levothyroxine (SYNTHROID, LEVOTHROID) 137 MCG tablet Take 137 mcg by mouth daily before breakfast.    . oxyCODONE-acetaminophen (PERCOCET) 10-325 MG per tablet Take 1 tablet by mouth every 4 (four) hours as needed (pain).    . potassium chloride SA (K-DUR,KLOR-CON) 20 MEQ tablet Take 20 mEq by mouth daily.    . promethazine (PHENERGAN) 12.5 MG tablet Take 1 tablet (12.5 mg total) by mouth 2 (two) times daily as needed for nausea or vomiting. 30 tablet 0  . sulfamethoxazole-trimethoprim (BACTRIM DS) 800-160 MG per tablet Take 1 tablet by mouth 2 (two) times daily. (Patient taking differently: Take 1 tablet by mouth 2 (two) times daily. D/C 10-05-14) 28 tablet 0  . Tavaborole (KERYDIN) 5 % SOLN Apply 1 drop each affected toenails once daily for 12 month 10 mL 6  . temazepam (RESTORIL) 30 MG capsule TAKE ONE CAPSULE BY MOUTH AT  BEDTIME AS NEEDED FOR SLEEP 30 capsule 5  . TRAVATAN Z 0.004 % SOLN ophthalmic solution     . valsartan (DIOVAN) 320 MG tablet Take 1 tablet (320 mg total) by mouth daily. 30 tablet 3   No current facility-administered medications on file prior to visit.    ROS:  Out of a complete 14 system review of  symptoms, the patient complains only of the following symptoms, and all other reviewed systems are negative.  Appetite change Eye redness Runny nose Leg swelling Abdominal pain, nausea Insomnia, daytime sleepiness Frequent urinary tract infections Back pain, multiple back surgeries Laceration, right leg Agitation, confusion, anxiety  Blood pressure 110/60, pulse 62, temperature 97.1 F (36.2 C), temperature source Oral, height 5' 2.5" (1.588 m), weight 173 lb (78.472 kg).  Physical Exam  General: The patient is alert and cooperative at the time of the examination.  Skin: 2+ edema below the knees is noted bilaterally.   Neurologic Exam  Mental status: The Mini-Mental Status Examination done today shows a total score of 11/30.  Cranial nerves: Facial symmetry is present. Speech is normal, no aphasia or dysarthria is noted. Extraocular movements are full. Visual fields are full.  Motor: The patient has good strength in all 4 extremities.  Sensory examination: Soft touch sensation on the face, arms, and legs is symmetric.  Coordination: The patient has good finger-nose-finger and heel-to-shin bilaterally. Mild apraxia with the use of this extremities is noted.  Gait and station: The patient has a normal gait. Tandem gait is unsteady. Romberg is negative. No drift is seen.  Reflexes: Deep tendon reflexes are symmetric.    CT head 03/20/14:  IMPRESSION: No acute intracranial abnormalities. Chronic atrophy and small vessel ischemic changes. 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   Assessment/Plan:  1. Progressive dementia  2. Chronic low back pain  The patient is on  chronic opiate medications, and she is having frequent urinary tract infections. The patient will go on vitamin C supplementation 500 mg 3 times daily, and cranberry tablets. The patient will continue the donepezil. She will follow-up in about 6 months. The patient seems to have stabilized following the head trauma in early 2015.  Jill Alexanders MD 10/30/2014 9:04 PM  [facility] [address] [place], [place] [address]  Phone [phone] Fax [phone]

## 2014-10-30 NOTE — Patient Instructions (Signed)
Alzheimer Disease Caregiver Guide Alzheimer disease is an illness that affects a person's brain. It causes a person to lose the ability to remember things and make good decisions. As the disease progresses, the person is unable to take care of himself or herself and needs more and more help to do simple tasks. Taking care of someone with Alzheimer disease can be very challenging and overwhelming.  MEMORY LOSS AND CONFUSION Memory loss and confusion is mild in the beginning stages of the disease. Both of these problems become more severe as the disease progresses. Eventually, the person will not recognize places or even close family members and friends.   Stay calm.  Respond with a short explanation. Long explanations can be overwhelming and confusing.  Avoid corrections that sound like scolding.  Try not to take it personally, even if the person forgets your name. BEHAVIOR CHANGES Behavior changes are part of the disease. The person may develop depression, anxiety, anger, hallucinations, or other behavior changes. These changes can come on suddenly and may be in response to pain, infection, changes in the environment (temperature, noise), overstimulation, or feeling lost or scared.   Try not to take behavior changes personally.  Remain calm and patient.  Do not argue or try to convince the person about a specific point. This will only make him or her more agitated.  Know that the behavior changes are part of the disease process and try to work through it. TIPS TO REDUCE FRUSTRATION  Schedule wisely by making appointments and doing daily tasks, like bathing and dressing, when the person is at his or her best.  Take your time. Simple tasks may take a lot longer, so be sure to allow for plenty of time.  Limit choices. Too many choices can be overwhelming and stressful for the person.  Involve the person in what you are doing.  Stick to a routine.  Avoid new or crowded situations, if  possible.  Use simple words, short sentences, and a calm voice. Only give one direction at a time.  Buy clothes and shoes that are easy to put on and take off.  Let people help if they offer. HOME SAFETY Keeping the home safe is very important to reduce the risk of falls and injuries.   Keep floors clear of clutter. Remove rugs, magazine racks, and floor lamps.  Keep hallways well lit.  Put a handrail and nonslip mat in the bathtub or shower.  Put childproof locks on cabinets with dangerous items, such as medicine, alcohol, guns, toxic cleaning items, sharp tools or utensils, matches, or lighters.  Place locks on doors where the person cannot easily see or reach them. This helps ensure that the person cannot wander out of the house and get lost.  Be prepared for emergencies. Keep a list of emergency phone numbers and addresses in a convenient area. PLANS FOR THE FUTURE  Do not put off talking about finances.  Talk about money management. People with Alzheimer disease have trouble managing their money as the disease gets worse.  Get help from professional advisors regarding financial and legal matters.  Do not put off talking about future care.  Choose a power of attorney. This is someone who can make decisions for the person with Alzheimer disease when he or she is no longer able to do so.  Talk about driving and when it is the right time to stop. The person's health care provider can help give advice on this matter.  Talk about  the person's living situation. If he or she lives alone, you need to make sure he or she is safe. Some people need extra help at home, and others need more care at a nursing home or care center. SUPPORT GROUPS Joining a support group can be very helpful for caregivers of people with Alzheimer disease. Some advantages to being part of a support group include:   Getting strategies to manage stress.  Sharing experiences with others.  Receiving  emotional comfort and support.  Learning new caregiving skills as the disease progresses.  Knowing what community resources are available and taking advantage of them. SEEK MEDICAL CARE IF:  The person has a fever.  The person has a sudden change in behavior that does not improve with calming strategies.  The person is unable to manage in his or her current living situation.  The person threatens you or anyone else, including himself or herself.  You are no longer able to care for the person. Document Released: 07/27/2004 Document Revised: 04/01/2014 Document Reviewed: 12/22/2011 [facility] Patient Information 2015 [place], [place]. This information is not intended to replace advice given to you by your health care provider. Make sure you discuss any questions you have with your health care provider.

## 2014-10-31 ENCOUNTER — Ambulatory Visit: Payer: Medicare Other | Admitting: Neurology

## 2014-11-03 ENCOUNTER — Other Ambulatory Visit: Payer: Self-pay | Admitting: Family Medicine

## 2014-11-04 ENCOUNTER — Ambulatory Visit: Payer: Medicare Other | Admitting: Neurology

## 2014-11-05 ENCOUNTER — Ambulatory Visit: Payer: Medicare Other | Admitting: Podiatry

## 2014-11-06 ENCOUNTER — Ambulatory Visit (INDEPENDENT_AMBULATORY_CARE_PROVIDER_SITE_OTHER): Payer: Medicare Other | Admitting: Podiatry

## 2014-11-06 ENCOUNTER — Encounter: Payer: Self-pay | Admitting: Podiatry

## 2014-11-06 VITALS — BP 150/72 | HR 71 | Resp 18

## 2014-11-06 DIAGNOSIS — M79676 Pain in unspecified toe(s): Secondary | ICD-10-CM

## 2014-11-06 DIAGNOSIS — B351 Tinea unguium: Secondary | ICD-10-CM

## 2014-11-07 NOTE — Progress Notes (Signed)
Patient ID: Alexa [name], female   DOB: 06-26-1936, 78 y.o.   MRN: [id number]  Subjective: 78 year old female presents the office today with her daughter for painful elongated toenails. Patient daughter states that she is unable to trim them herself and the daughter does not feel comfortable doing them. She denies any recent redness or drainage from the nail sites. The patient does have the nails are painful particularly with shoe gear and pressure to them. No other complaints at this time.  Objective: Awake, alert, NAD  DP/PT pulses palpable bilaterally, CRT less than 3 seconds  Protective sensation slightly decreased with Simms Weinstein monofilament, vibratory sensation intact, Achilles tendon reflex intact  Nails hypertrophic, dystrophic, elongated, brittle, discolored 10. No swelling erythema or drainage from the nail sites.  There is mild varicosities of bilateral feet.  No open lesions or pre-ulcer lesions.  No pain with calf compression, swelling, warmth, erythema.   Assessment: 78 year old female with symptomatic onychomycosis   Plan: -Treatment options were discussed including alternatives, risks, complications  -Nails are sharply debrided 10 without complications.  -Discussed the importance of daily foot inspection  -Follow-up in 3 months or sooner if any problems should arise. In the meantime, call the office with any questions, concerns, change in symptoms.

## 2014-11-18 ENCOUNTER — Other Ambulatory Visit: Payer: Self-pay | Admitting: Cardiovascular Disease

## 2014-11-19 ENCOUNTER — Ambulatory Visit: Payer: Medicare Other | Admitting: Family Medicine

## 2014-12-12 ENCOUNTER — Encounter (HOSPITAL_COMMUNITY): Payer: Self-pay | Admitting: Emergency Medicine

## 2014-12-12 DIAGNOSIS — G8929 Other chronic pain: Secondary | ICD-10-CM | POA: Diagnosis present

## 2014-12-12 DIAGNOSIS — Z87442 Personal history of urinary calculi: Secondary | ICD-10-CM

## 2014-12-12 DIAGNOSIS — Z87891 Personal history of nicotine dependence: Secondary | ICD-10-CM

## 2014-12-12 DIAGNOSIS — Z853 Personal history of malignant neoplasm of breast: Secondary | ICD-10-CM

## 2014-12-12 DIAGNOSIS — J189 Pneumonia, unspecified organism: Secondary | ICD-10-CM | POA: Diagnosis not present

## 2014-12-12 DIAGNOSIS — E039 Hypothyroidism, unspecified: Secondary | ICD-10-CM | POA: Diagnosis present

## 2014-12-12 DIAGNOSIS — I10 Essential (primary) hypertension: Secondary | ICD-10-CM | POA: Diagnosis present

## 2014-12-12 DIAGNOSIS — J45909 Unspecified asthma, uncomplicated: Secondary | ICD-10-CM | POA: Diagnosis present

## 2014-12-12 DIAGNOSIS — F0280 Dementia in other diseases classified elsewhere without behavioral disturbance: Secondary | ICD-10-CM | POA: Diagnosis present

## 2014-12-12 DIAGNOSIS — Z86711 Personal history of pulmonary embolism: Secondary | ICD-10-CM

## 2014-12-12 DIAGNOSIS — E785 Hyperlipidemia, unspecified: Secondary | ICD-10-CM | POA: Diagnosis present

## 2014-12-12 DIAGNOSIS — R1084 Generalized abdominal pain: Secondary | ICD-10-CM | POA: Diagnosis not present

## 2014-12-12 DIAGNOSIS — I272 Other secondary pulmonary hypertension: Secondary | ICD-10-CM | POA: Diagnosis present

## 2014-12-12 DIAGNOSIS — I480 Paroxysmal atrial fibrillation: Secondary | ICD-10-CM | POA: Diagnosis present

## 2014-12-12 DIAGNOSIS — Z8673 Personal history of transient ischemic attack (TIA), and cerebral infarction without residual deficits: Secondary | ICD-10-CM

## 2014-12-12 DIAGNOSIS — K219 Gastro-esophageal reflux disease without esophagitis: Secondary | ICD-10-CM | POA: Diagnosis present

## 2014-12-12 DIAGNOSIS — M1990 Unspecified osteoarthritis, unspecified site: Secondary | ICD-10-CM | POA: Diagnosis present

## 2014-12-12 DIAGNOSIS — Z85118 Personal history of other malignant neoplasm of bronchus and lung: Secondary | ICD-10-CM

## 2014-12-12 DIAGNOSIS — G309 Alzheimer's disease, unspecified: Secondary | ICD-10-CM | POA: Diagnosis present

## 2014-12-12 DIAGNOSIS — J9601 Acute respiratory failure with hypoxia: Secondary | ICD-10-CM | POA: Diagnosis present

## 2014-12-12 DIAGNOSIS — R1032 Left lower quadrant pain: Secondary | ICD-10-CM | POA: Diagnosis present

## 2014-12-12 DIAGNOSIS — I5032 Chronic diastolic (congestive) heart failure: Secondary | ICD-10-CM | POA: Diagnosis present

## 2014-12-12 DIAGNOSIS — Z86010 Personal history of colonic polyps: Secondary | ICD-10-CM

## 2014-12-12 LAB — CBC WITH DIFFERENTIAL/PLATELET
Basophils Absolute: 0 10*3/uL (ref 0.0–0.1)
Basophils Relative: 0 % (ref 0–1)
EOS ABS: 0.2 10*3/uL (ref 0.0–0.7)
Eosinophils Relative: 2 % (ref 0–5)
HCT: 39.8 % (ref 36.0–46.0)
Hemoglobin: 13.4 g/dL (ref 12.0–15.0)
LYMPHS ABS: 1.6 10*3/uL (ref 0.7–4.0)
Lymphocytes Relative: 21 % (ref 12–46)
MCH: 28.6 pg (ref 26.0–34.0)
MCHC: 33.7 g/dL (ref 30.0–36.0)
MCV: 85 fL (ref 78.0–100.0)
Monocytes Absolute: 0.6 10*3/uL (ref 0.1–1.0)
Monocytes Relative: 8 % (ref 3–12)
NEUTROS PCT: 69 % (ref 43–77)
Neutro Abs: 5.3 10*3/uL (ref 1.7–7.7)
PLATELETS: 251 10*3/uL (ref 150–400)
RBC: 4.68 MIL/uL (ref 3.87–5.11)
RDW: 14.1 % (ref 11.5–15.5)
WBC: 7.7 10*3/uL (ref 4.0–10.5)

## 2014-12-12 LAB — I-STAT CHEM 8, ED
BUN: 27 mg/dL — ABNORMAL HIGH (ref 6–23)
Calcium, Ion: 1.16 mmol/L (ref 1.13–1.30)
Chloride: 101 mEq/L (ref 96–112)
Creatinine, Ser: 1.1 mg/dL (ref 0.50–1.10)
GLUCOSE: 139 mg/dL — AB (ref 70–99)
HCT: 43 % (ref 36.0–46.0)
Hemoglobin: 14.6 g/dL (ref 12.0–15.0)
POTASSIUM: 3.6 mmol/L (ref 3.5–5.1)
Sodium: 143 mmol/L (ref 135–145)
TCO2: 25 mmol/L (ref 0–100)

## 2014-12-12 NOTE — ED Notes (Signed)
Pt c/o abd pain, onset earlier tonight.  Has been having pain off and on for several weeks.  No nausea, vomiting or diarrhea.  Last BM today

## 2014-12-13 ENCOUNTER — Encounter (HOSPITAL_COMMUNITY): Payer: Self-pay | Admitting: Radiology

## 2014-12-13 ENCOUNTER — Inpatient Hospital Stay (HOSPITAL_COMMUNITY)
Admission: EM | Admit: 2014-12-13 | Discharge: 2014-12-14 | DRG: 193 | Disposition: A | Discharge status: Home / Self Care | Payer: Medicare Other | Attending: Internal Medicine | Admitting: Internal Medicine

## 2014-12-13 ENCOUNTER — Emergency Department (HOSPITAL_COMMUNITY): Payer: Medicare Other

## 2014-12-13 DIAGNOSIS — N3000 Acute cystitis without hematuria: Secondary | ICD-10-CM

## 2014-12-13 DIAGNOSIS — Z8673 Personal history of transient ischemic attack (TIA), and cerebral infarction without residual deficits: Secondary | ICD-10-CM | POA: Diagnosis not present

## 2014-12-13 DIAGNOSIS — Z85118 Personal history of other malignant neoplasm of bronchus and lung: Secondary | ICD-10-CM | POA: Diagnosis not present

## 2014-12-13 DIAGNOSIS — I10 Essential (primary) hypertension: Secondary | ICD-10-CM | POA: Diagnosis present

## 2014-12-13 DIAGNOSIS — Z87442 Personal history of urinary calculi: Secondary | ICD-10-CM | POA: Diagnosis not present

## 2014-12-13 DIAGNOSIS — Z87891 Personal history of nicotine dependence: Secondary | ICD-10-CM | POA: Diagnosis not present

## 2014-12-13 DIAGNOSIS — Z86010 Personal history of colonic polyps: Secondary | ICD-10-CM | POA: Diagnosis not present

## 2014-12-13 DIAGNOSIS — I5032 Chronic diastolic (congestive) heart failure: Secondary | ICD-10-CM | POA: Diagnosis present

## 2014-12-13 DIAGNOSIS — F0390 Unspecified dementia without behavioral disturbance: Secondary | ICD-10-CM | POA: Diagnosis present

## 2014-12-13 DIAGNOSIS — E039 Hypothyroidism, unspecified: Secondary | ICD-10-CM | POA: Diagnosis present

## 2014-12-13 DIAGNOSIS — J45909 Unspecified asthma, uncomplicated: Secondary | ICD-10-CM | POA: Diagnosis present

## 2014-12-13 DIAGNOSIS — R109 Unspecified abdominal pain: Secondary | ICD-10-CM | POA: Diagnosis present

## 2014-12-13 DIAGNOSIS — R1084 Generalized abdominal pain: Secondary | ICD-10-CM | POA: Diagnosis present

## 2014-12-13 DIAGNOSIS — Z853 Personal history of malignant neoplasm of breast: Secondary | ICD-10-CM | POA: Diagnosis not present

## 2014-12-13 DIAGNOSIS — R0602 Shortness of breath: Secondary | ICD-10-CM | POA: Diagnosis present

## 2014-12-13 DIAGNOSIS — M1990 Unspecified osteoarthritis, unspecified site: Secondary | ICD-10-CM | POA: Diagnosis present

## 2014-12-13 DIAGNOSIS — F0280 Dementia in other diseases classified elsewhere without behavioral disturbance: Secondary | ICD-10-CM | POA: Diagnosis present

## 2014-12-13 DIAGNOSIS — E785 Hyperlipidemia, unspecified: Secondary | ICD-10-CM | POA: Diagnosis present

## 2014-12-13 DIAGNOSIS — I272 Other secondary pulmonary hypertension: Secondary | ICD-10-CM | POA: Diagnosis present

## 2014-12-13 DIAGNOSIS — K219 Gastro-esophageal reflux disease without esophagitis: Secondary | ICD-10-CM | POA: Diagnosis present

## 2014-12-13 DIAGNOSIS — J189 Pneumonia, unspecified organism: Principal | ICD-10-CM | POA: Diagnosis present

## 2014-12-13 DIAGNOSIS — I480 Paroxysmal atrial fibrillation: Secondary | ICD-10-CM | POA: Diagnosis present

## 2014-12-13 DIAGNOSIS — G8929 Other chronic pain: Secondary | ICD-10-CM | POA: Diagnosis present

## 2014-12-13 DIAGNOSIS — J9601 Acute respiratory failure with hypoxia: Secondary | ICD-10-CM | POA: Diagnosis present

## 2014-12-13 DIAGNOSIS — R1032 Left lower quadrant pain: Secondary | ICD-10-CM | POA: Diagnosis present

## 2014-12-13 DIAGNOSIS — G309 Alzheimer's disease, unspecified: Secondary | ICD-10-CM | POA: Diagnosis present

## 2014-12-13 DIAGNOSIS — Z86711 Personal history of pulmonary embolism: Secondary | ICD-10-CM | POA: Diagnosis not present

## 2014-12-13 HISTORY — DX: Low back pain: M54.5

## 2014-12-13 HISTORY — DX: Alzheimer's disease, unspecified: G30.9

## 2014-12-13 HISTORY — DX: Calculus of kidney: N20.0

## 2014-12-13 HISTORY — DX: Pneumonia, unspecified organism: J18.9

## 2014-12-13 HISTORY — DX: Dementia in other diseases classified elsewhere, unspecified severity, without behavioral disturbance, psychotic disturbance, mood disturbance, and anxiety: F02.80

## 2014-12-13 HISTORY — DX: Low back pain, unspecified: M54.50

## 2014-12-13 HISTORY — DX: Other chronic pain: G89.29

## 2014-12-13 LAB — COMPREHENSIVE METABOLIC PANEL
ALT: 49 U/L — AB (ref 0–35)
ALT: 82 U/L — ABNORMAL HIGH (ref 0–35)
ANION GAP: 8 (ref 5–15)
AST: 142 U/L — ABNORMAL HIGH (ref 0–37)
AST: 132 U/L — ABNORMAL HIGH (ref 0–37)
Albumin: 3.5 g/dL (ref 3.5–5.2)
Albumin: 3.1 g/dL — ABNORMAL LOW (ref 3.5–5.2)
Alkaline Phosphatase: 160 U/L — ABNORMAL HIGH (ref 39–117)
Alkaline Phosphatase: 147 U/L — ABNORMAL HIGH (ref 39–117)
Anion gap: 13 (ref 5–15)
BILIRUBIN TOTAL: 1.1 mg/dL (ref 0.3–1.2)
BILIRUBIN TOTAL: 1.2 mg/dL (ref 0.3–1.2)
BUN: 21 mg/dL (ref 6–23)
BUN: 18 mg/dL (ref 6–23)
CALCIUM: 8.7 mg/dL (ref 8.4–10.5)
CALCIUM: 9 mg/dL (ref 8.4–10.5)
CHLORIDE: 103 meq/L (ref 96–112)
CO2: 29 mmol/L (ref 19–32)
CO2: 25 mmol/L (ref 19–32)
Chloride: 103 mEq/L (ref 96–112)
Creatinine, Ser: 1.19 mg/dL — ABNORMAL HIGH (ref 0.50–1.10)
Creatinine, Ser: 1.11 mg/dL — ABNORMAL HIGH (ref 0.50–1.10)
GFR calc non Af Amer: 47 mL/min — ABNORMAL LOW (ref >90)
GFR, EST AFRICAN AMERICAN: 50 mL/min — AB (ref >90)
GFR, EST AFRICAN AMERICAN: 54 mL/min — AB (ref >90)
GFR, EST NON AFRICAN AMERICAN: 43 mL/min — AB (ref >90)
GLUCOSE: 103 mg/dL — AB (ref 70–99)
Glucose, Bld: 215 mg/dL — ABNORMAL HIGH (ref 70–99)
Potassium: 3.6 mmol/L (ref 3.5–5.1)
Potassium: 3.4 mmol/L — ABNORMAL LOW (ref 3.5–5.1)
SODIUM: 141 mmol/L (ref 135–145)
Sodium: 140 mmol/L (ref 135–145)
Total Protein: 6.7 g/dL (ref 6.0–8.3)
Total Protein: 6.5 g/dL (ref 6.0–8.3)

## 2014-12-13 LAB — URINE MICROSCOPIC-ADD ON

## 2014-12-13 LAB — URINALYSIS, ROUTINE W REFLEX MICROSCOPIC
GLUCOSE, UA: NEGATIVE mg/dL
Hgb urine dipstick: NEGATIVE
KETONES UR: NEGATIVE mg/dL
Nitrite: NEGATIVE
Protein, ur: NEGATIVE mg/dL
Specific Gravity, Urine: 1.025 (ref 1.005–1.030)
Urobilinogen, UA: 1 mg/dL (ref 0.0–1.0)
pH: 5 (ref 5.0–8.0)

## 2014-12-13 LAB — CBC WITH DIFFERENTIAL/PLATELET
Basophils Absolute: 0 10*3/uL (ref 0.0–0.1)
Basophils Relative: 0 % (ref 0–1)
Eosinophils Absolute: 0 10*3/uL (ref 0.0–0.7)
Eosinophils Relative: 0 % (ref 0–5)
HCT: 35.9 % — ABNORMAL LOW (ref 36.0–46.0)
Hemoglobin: 11.7 g/dL — ABNORMAL LOW (ref 12.0–15.0)
LYMPHS ABS: 0.3 10*3/uL — AB (ref 0.7–4.0)
Lymphocytes Relative: 3 % — ABNORMAL LOW (ref 12–46)
MCH: 28.3 pg (ref 26.0–34.0)
MCHC: 32.6 g/dL (ref 30.0–36.0)
MCV: 86.7 fL (ref 78.0–100.0)
MONOS PCT: 1 % — AB (ref 3–12)
Monocytes Absolute: 0.1 10*3/uL (ref 0.1–1.0)
NEUTROS PCT: 96 % — AB (ref 43–77)
Neutro Abs: 7.7 10*3/uL (ref 1.7–7.7)
PLATELETS: 195 10*3/uL (ref 150–400)
RBC: 4.14 MIL/uL (ref 3.87–5.11)
RDW: 14.2 % (ref 11.5–15.5)
WBC: 8 10*3/uL (ref 4.0–10.5)

## 2014-12-13 LAB — I-STAT CG4 LACTIC ACID, ED: Lactic Acid, Venous: 1.07 mmol/L (ref 0.5–2.2)

## 2014-12-13 LAB — BRAIN NATRIURETIC PEPTIDE: B Natriuretic Peptide: 568.1 pg/mL — ABNORMAL HIGH (ref 0.0–100.0)

## 2014-12-13 LAB — LIPASE, BLOOD
LIPASE: 46 U/L (ref 11–59)
Lipase: 36 U/L (ref 11–59)

## 2014-12-13 LAB — I-STAT TROPONIN, ED: TROPONIN I, POC: 0.01 ng/mL (ref 0.00–0.08)

## 2014-12-13 MED ORDER — PANTOPRAZOLE SODIUM 40 MG PO TBEC
40.0000 mg | DELAYED_RELEASE_TABLET | Freq: Every day | ORAL | Status: DC
  Administered 2014-12-13 – 2014-12-14 (×2): 40 mg via ORAL
  Filled 2014-12-13 (×2): qty 1

## 2014-12-13 MED ORDER — FUROSEMIDE 20 MG PO TABS
20.0000 mg | ORAL_TABLET | Freq: Two times a day (BID) | ORAL | Status: DC
  Administered 2014-12-13: 20 mg via ORAL
  Filled 2014-12-13: qty 1

## 2014-12-13 MED ORDER — PREDNISONE 20 MG PO TABS
60.0000 mg | ORAL_TABLET | Freq: Once | ORAL | Status: AC
  Administered 2014-12-13: 60 mg via ORAL
  Filled 2014-12-13: qty 3

## 2014-12-13 MED ORDER — DEXTROSE 5 % IV SOLN
500.0000 mg | INTRAVENOUS | Status: DC
  Administered 2014-12-14: 500 mg via INTRAVENOUS
  Filled 2014-12-13: qty 500

## 2014-12-13 MED ORDER — OXYCODONE-ACETAMINOPHEN 5-325 MG PO TABS
1.0000 | ORAL_TABLET | ORAL | Status: DC | PRN
  Administered 2014-12-13 – 2014-12-14 (×2): 1 via ORAL
  Filled 2014-12-13 (×2): qty 1

## 2014-12-13 MED ORDER — SODIUM CHLORIDE 0.9 % IV BOLUS (SEPSIS)
1000.0000 mL | Freq: Once | INTRAVENOUS | Status: AC
  Administered 2014-12-13: 1000 mL via INTRAVENOUS

## 2014-12-13 MED ORDER — ACETAMINOPHEN 650 MG RE SUPP: 650.0000 mg | Freq: Four times a day (QID) | RECTAL | Status: DC | PRN

## 2014-12-13 MED ORDER — ONDANSETRON HCL 4 MG/2ML IJ SOLN: 4.0000 mg | Freq: Four times a day (QID) | INTRAMUSCULAR | Status: DC | PRN

## 2014-12-13 MED ORDER — LATANOPROST 0.005 % OP SOLN
1.0000 [drp] | Freq: Every day | OPHTHALMIC | Status: DC
  Filled 2014-12-13: qty 2.5

## 2014-12-13 MED ORDER — ONDANSETRON HCL 4 MG PO TABS: 4.0000 mg | ORAL_TABLET | Freq: Four times a day (QID) | ORAL | Status: DC | PRN

## 2014-12-13 MED ORDER — FUROSEMIDE 20 MG PO TABS
20.0000 mg | ORAL_TABLET | Freq: Two times a day (BID) | ORAL | Status: DC
  Administered 2014-12-13 – 2014-12-14 (×2): 20 mg via ORAL
  Filled 2014-12-13 (×4): qty 1

## 2014-12-13 MED ORDER — LEVOTHYROXINE SODIUM 137 MCG PO TABS
137.0000 ug | ORAL_TABLET | Freq: Every day | ORAL | Status: DC
  Administered 2014-12-13 – 2014-12-14 (×2): 137 ug via ORAL
  Filled 2014-12-13 (×3): qty 1

## 2014-12-13 MED ORDER — ENSURE COMPLETE PO LIQD: 237.0000 mL | Freq: Two times a day (BID) | ORAL | Status: DC

## 2014-12-13 MED ORDER — POTASSIUM CHLORIDE CRYS ER 20 MEQ PO TBCR
20.0000 meq | EXTENDED_RELEASE_TABLET | Freq: Every day | ORAL | Status: DC
  Administered 2014-12-13 – 2014-12-14 (×2): 20 meq via ORAL
  Filled 2014-12-13 (×2): qty 1

## 2014-12-13 MED ORDER — IRBESARTAN 300 MG PO TABS
300.0000 mg | ORAL_TABLET | Freq: Every day | ORAL | Status: DC
Start: 2014-12-13 — End: 2014-12-14
  Administered 2014-12-14: 300 mg via ORAL
  Filled 2014-12-13 (×2): qty 1

## 2014-12-13 MED ORDER — DONEPEZIL HCL 10 MG PO TABS
10.0000 mg | ORAL_TABLET | Freq: Every day | ORAL | Status: DC
  Administered 2014-12-13: 10 mg via ORAL
  Filled 2014-12-13 (×2): qty 1

## 2014-12-13 MED ORDER — IPRATROPIUM-ALBUTEROL 0.5-2.5 (3) MG/3ML IN SOLN: 3.0000 mL | Freq: Four times a day (QID) | RESPIRATORY_TRACT | Status: DC | PRN

## 2014-12-13 MED ORDER — OXYCODONE-ACETAMINOPHEN 10-325 MG PO TABS: 1.0000 | ORAL_TABLET | ORAL | Status: DC | PRN

## 2014-12-13 MED ORDER — DEXTROSE 5 % IV SOLN
1.0000 g | INTRAVENOUS | Status: DC
  Administered 2014-12-14: 1 g via INTRAVENOUS
  Filled 2014-12-13: qty 10

## 2014-12-13 MED ORDER — TEMAZEPAM 15 MG PO CAPS: 30.0000 mg | ORAL_CAPSULE | Freq: Every evening | ORAL | Status: DC | PRN

## 2014-12-13 MED ORDER — MORPHINE SULFATE 2 MG/ML IJ SOLN
1.0000 mg | INTRAMUSCULAR | Status: DC | PRN
  Administered 2014-12-13: 1 mg via INTRAVENOUS
  Filled 2014-12-13: qty 1

## 2014-12-13 MED ORDER — DICYCLOMINE HCL 10 MG PO CAPS
10.0000 mg | ORAL_CAPSULE | Freq: Two times a day (BID) | ORAL | Status: DC | PRN
  Administered 2014-12-13: 10 mg via ORAL
  Filled 2014-12-13: qty 1

## 2014-12-13 MED ORDER — ACETAMINOPHEN 325 MG PO TABS: 650.0000 mg | ORAL_TABLET | Freq: Four times a day (QID) | ORAL | Status: DC | PRN

## 2014-12-13 MED ORDER — ENOXAPARIN SODIUM 40 MG/0.4ML ~~LOC~~ SOLN
40.0000 mg | SUBCUTANEOUS | Status: DC
  Administered 2014-12-13: 40 mg via SUBCUTANEOUS
  Filled 2014-12-13 (×3): qty 0.4

## 2014-12-13 MED ORDER — IOHEXOL 350 MG/ML SOLN
100.0000 mL | Freq: Once | INTRAVENOUS | Status: AC | PRN
  Administered 2014-12-13: 100 mL via INTRAVENOUS

## 2014-12-13 MED ORDER — MORPHINE SULFATE ER 30 MG PO TBCR
30.0000 mg | EXTENDED_RELEASE_TABLET | Freq: Two times a day (BID) | ORAL | Status: DC
  Administered 2014-12-13 – 2014-12-14 (×2): 30 mg via ORAL
  Filled 2014-12-13 (×2): qty 2

## 2014-12-13 MED ORDER — AZITHROMYCIN 250 MG PO TABS
500.0000 mg | ORAL_TABLET | Freq: Once | ORAL | Status: AC
  Administered 2014-12-13: 500 mg via ORAL
  Filled 2014-12-13: qty 2

## 2014-12-13 MED ORDER — IPRATROPIUM-ALBUTEROL 0.5-2.5 (3) MG/3ML IN SOLN
3.0000 mL | Freq: Three times a day (TID) | RESPIRATORY_TRACT | Status: DC
  Administered 2014-12-13 (×2): 3 mL via RESPIRATORY_TRACT
  Filled 2014-12-13 (×2): qty 3

## 2014-12-13 MED ORDER — ALBUTEROL (5 MG/ML) CONTINUOUS INHALATION SOLN
10.0000 mg/h | INHALATION_SOLUTION | RESPIRATORY_TRACT | Status: DC
  Administered 2014-12-13: 10 mg/h via RESPIRATORY_TRACT
  Filled 2014-12-13: qty 20

## 2014-12-13 MED ORDER — SODIUM CHLORIDE 0.9 % IJ SOLN
3.0000 mL | Freq: Two times a day (BID) | INTRAMUSCULAR | Status: DC
  Administered 2014-12-13: 3 mL via INTRAVENOUS

## 2014-12-13 MED ORDER — CEFTRIAXONE SODIUM 2 G IJ SOLR
2.0000 g | Freq: Once | INTRAMUSCULAR | Status: AC
  Administered 2014-12-13: 2 g via INTRAVENOUS
  Filled 2014-12-13: qty 2

## 2014-12-13 MED ORDER — OXYCODONE HCL 5 MG PO TABS
5.0000 mg | ORAL_TABLET | ORAL | Status: DC | PRN
  Administered 2014-12-13 – 2014-12-14 (×2): 5 mg via ORAL
  Filled 2014-12-13 (×2): qty 1

## 2014-12-13 NOTE — H&P (Signed)
[facility] History and Physical  Alexa [name] YCX:[id number] DOB: 07-13-1936 DOA: 12/13/2014  Referring physician: ER physician. PCP: Laurey Morale, MD   Chief Complaint: Abdominal pain.  HPI: Alexa [name] is a 79 y.o. female with a history of dementia, diastolic CHF last EF measured was 60-65% in July 2015, paroxysmal atrial fibrillation, history of PE status post IVC filter placement secondary to a history of subarachnoid hemorrhage, hypertension, hypothyroidism was brought to the ER after patient was having abdominal pain which has worsened from her usual. As per patient's daughter who provided the history patient has chronic abdominal pain but last evening acutely worsened and was brought to the ER. CT abdomen and pelvis done which does not show anything acute abdomen. Due to some infiltrates in the right lower lobe of the lung. While waiting in the ER patient became acutely short of breath and was placed on nebulizer and since CAT scan done for the abdomen was showing possible pneumonia patient has been placed on antibiotics. Patient has advanced dementia and does not contribute much to the history. But as per the daughter patient did not have any chest pain nausea vomiting diarrhea. Abdominal pain is mostly in the epigastric area.   Review of Systems: As presented in the history of presenting illness, rest negative.  Past Medical History  Diagnosis Date  . Hyperlipidemia     a. Myalgias with multiple statins.  . Hypertension   . Adenocarcinoma of breast     a. s/p right simple and left modified radical mastectomy with chemotherapy and radiation in 2002.   Marland Kitchen GERD (gastroesophageal reflux disease)   . Diverticulosis   . Hx of adenomatous colonic polyps   . DVT (deep venous thrombosis) 09-30-11    a. Right lower leg 09/2011.  . Arthritis   . Hypothyroidism   . Dementia     a. sees Dr. Brett Fairy. b. on Aricept.  Marland Kitchen History of kidney stones   . DDD (degenerative disc  disease)     a. cervical and lumbar herniation and DJD s/p multiple diskectomies and other back surgeries from 2002-2008   . Asthma   . H/O vein stripping   . History of tobacco abuse     a. quit 25+ years ago. PFTs in 2/15 were relatively normal.   . Chronic diastolic CHF (congestive heart failure)     a. ETT-myoview (9/12) with 4' exercise, EF 68%, no ischemia or infarction. b. Echo (10/14) with EF 55-60%, mod diast dysfunction, normal RV size/fcn, mod pulm HTN.   . Venous insufficiency     a. H/o venous stasis ulceration.   Marland Kitchen PAF (paroxysmal atrial fibrillation)     a. Only episode that noted was in setting of right upper lobectomy in 2014. She was briefly on amiodarone but stopped it on her own. She is not coumadin. If recurrent AF, will need to consider.  . Non-small cell lung cancer     a. s/p right upper lobectomy in 2014.   . Pulmonary HTN     a. Moderate by echo 08/2013.  Marland Kitchen Cerebral infarct     a.  Remote anterior left frontal lobe infarct by CT head 01/2014.  . PE (pulmonary embolism)   . Alzheimer's disease    Past Surgical History  Procedure Laterality Date  . Cholecystectomy  2007  . Breast surgery  2002    BL mastectomy  . Lumbar laminectomy      x3  . Rotator cuff repair Right 10  .  Cervical spine surgery    . Hip surgery    . Spine surgery    . Abdominal hysterectomy    . Fiberoptic bronchoscopy with electromagnetic  10/07/2010  . Video bronchoscopy N/A 05/14/2013    Procedure: VIDEO BRONCHOSCOPY;  Surgeon: Grace Isaac, MD;  Location: [facility] OR;  Service: Thoracic;  Laterality: N/A;  . Video assisted thoracoscopy (vats)/ lobectomy Right 05/14/2013    Procedure: VIDEO ASSISTED THORACOSCOPY (VATS)/ LOBECTOMY;  Surgeon: Grace Isaac, MD;  Location: [facility];  Service: Thoracic;  Laterality: Right;  . Colonoscopy  02-18-09    per Dr. Fuller Plan, adenomatous polyp, repeat in 5 yrs   . Neck-acdf    . Varicose vein surgery    . Hemorrhoid surgery  1985  . Kidney stone  surgery  1987  . Vena cava filter placement  01/2014  . Lobectomy     Social History:  reports that she quit smoking about 30 years ago. Her smoking use included Cigarettes. She started smoking about 55 years ago. She has a 25 pack-year smoking history. She has never used smokeless tobacco. She reports that she does not drink alcohol or use illicit drugs. Where does patient live home. Can patient participate in ADLs? No.  Allergies  Allergen Reactions  . Simvastatin Other (See Comments)    myalgia  . Statins Other (See Comments)    Myalgias with multiple statins   . Strawberry Swelling    Swelling ,rash  . Hydrocodone-Homatropine Nausea Only  . Nsaids Other (See Comments)    unknown  . Penicillins Rash    Family History:  Family History  Problem Relation Age of Onset  . Diabetes Daughter   . Diabetes Daughter   . Heart disease Father   . Heart disease Brother   . Dementia Brother   . Heart disease Sister   . Cancer Sister     breast  . Colon cancer Daughter 5  . Colon polyps Daughter   . Stroke Mother   . Liver disease Daughter   . Heart attack    . Cancer Sister     breast      Prior to Admission medications   Medication Sig Start Date End Date Taking? Authorizing Provider  dicyclomine (BENTYL) 10 MG capsule Take 1 capsule (10 mg total) by mouth 2 (two) times daily as needed for spasms. 10/15/14  Yes Willia Craze, NP  donepezil (ARICEPT) 10 MG tablet Take 1 tablet (10 mg total) by mouth at bedtime. 07/03/14  Yes Carmen Dohmeier, MD  esomeprazole (NEXIUM) 40 MG capsule Take 40 mg by mouth daily at 12 noon.   Yes Historical Provider, MD  furosemide (LASIX) 40 MG tablet TAKE 1/2 TABLET BY MOUTH TWICE A DAY 10/30/14  Yes Burnell Blanks, MD  levothyroxine (SYNTHROID, LEVOTHROID) 137 MCG tablet Take 137 mcg by mouth daily before breakfast.   Yes Historical Provider, MD  morphine (MS CONTIN) 30 MG 12 hr tablet Take 30 mg by mouth every 12 (twelve) hours. 10/22/14   Yes Historical Provider, MD  oxyCODONE-acetaminophen (PERCOCET) 10-325 MG per tablet Take 1 tablet by mouth every 4 (four) hours as needed (pain). 03/04/14  Yes Adeline C Viyuoh, MD  potassium chloride SA (K-DUR,KLOR-CON) 20 MEQ tablet Take 20 mEq by mouth daily. 02/13/14  Yes Larey Dresser, MD  temazepam (RESTORIL) 30 MG capsule TAKE ONE CAPSULE BY MOUTH AT BEDTIME AS NEEDED FOR SLEEP 10/07/14  Yes Laurey Morale, MD  TRAVATAN Z 0.004 % SOLN ophthalmic  solution  10/09/14  Yes Historical Provider, MD  valsartan (DIOVAN) 320 MG tablet TAKE 1 TABLET BY MOUTH EVERY DAY 11/19/14  Yes Larey Dresser, MD  ciprofloxacin (CIPRO) 500 MG tablet Take 1 tablet (500 mg total) by mouth 2 (two) times daily. Patient not taking: Reported on 12/13/2014 10/14/14   Laurey Morale, MD  promethazine (PHENERGAN) 12.5 MG tablet Take 1 tablet (12.5 mg total) by mouth 2 (two) times daily as needed for nausea or vomiting. Patient not taking: Reported on 12/13/2014 10/15/14   Willia Craze, NP  sulfamethoxazole-trimethoprim (BACTRIM DS) 800-160 MG per tablet Take 1 tablet by mouth 2 (two) times daily. Patient not taking: Reported on 12/13/2014 09/23/14   Laurey Morale, MD  [facility]) 5 % SOLN Apply 1 drop each affected toenails once daily for 12 month Patient not taking: Reported on 12/13/2014 06/28/14   Harriet Masson, DPM    Physical Exam: Filed Vitals:   12/13/14 0430 12/13/14 0445 12/13/14 0617 12/13/14 0630  BP: 166/67 161/70 147/49 120/43  Pulse: 123 107 109 95  Temp:      TempSrc:      Resp:  26 23 25   Height:      Weight:      SpO2: 92% 94% 93% 100%     General:  Moderately built and nourished.  Eyes: Anicteric. No pallor.  ENT: No discharge from the ears eyes nose or mouth.  Neck: No mass felt.  Cardiovascular: S1 and S2 heard.   Respiratory: No rhonchi or crepitations.  Abdomen: Soft nontender bowel sounds present. No guarding or rigidity.  Skin: Chronic skin changes in the lower  extremities.  Musculoskeletal: Mild edema.  Psychiatric: Patient has dementia.  Neurologic: Alert awake oriented to her name. Follows commands and moves all extremities.  Labs on Admission:  Basic Metabolic Panel:  Recent Labs Lab 12/12/14 2236 12/13/14 0054  NA 143 140  K 3.6 3.6  CL 101 103  CO2  --  29  GLUCOSE 139* 103*  BUN 27* 21  CREATININE 1.10 1.19*  CALCIUM  --  9.0   Liver Function Tests:  Recent Labs Lab 12/13/14 0054  AST 142*  ALT 49*  ALKPHOS 160*  BILITOT 1.1  PROT 6.7  ALBUMIN 3.5    Recent Labs Lab 12/13/14 0054  LIPASE 46   No results for input(s): AMMONIA in the last 168 hours. CBC:  Recent Labs Lab 12/12/14 2221 12/12/14 2236  WBC 7.7  --   NEUTROABS 5.3  --   HGB 13.4 14.6  HCT 39.8 43.0  MCV 85.0  --   PLT 251  --    Cardiac Enzymes: No results for input(s): CKTOTAL, CKMB, CKMBINDEX, TROPONINI in the last 168 hours.  BNP (last 3 results)  Recent Labs  02/13/14 1532  PROBNP 212.0*   CBG: No results for input(s): GLUCAP in the last 168 hours.  Radiological Exams on Admission: Ct Cta Abd/pel W/cm &/or W/o Cm  12/13/2014   CLINICAL DATA:  Acute onset of generalized abdominal pain. Initial encounter.  EXAM: CTA ABDOMEN AND PELVIS wITHOUT AND WITH CONTRAST  TECHNIQUE: Multidetector CT imaging of the abdomen and pelvis was performed using the standard protocol during bolus administration of intravenous contrast. Multiplanar reconstructed images and MIPs were obtained and reviewed to evaluate the vascular anatomy.  CONTRAST:  127mL OMNIPAQUE IOHEXOL 350 MG/ML SOLN  COMPARISON:  CT of the abdomen and pelvis performed 07/02/2014  FINDINGS: Right basilar airspace opacity is noted, with diffuse  interstitial prominence. Apparent intraluminal material at the distal esophagus is likely transient in nature.  There is no evidence of aortic dissection. There is no evidence of aneurysmal dilatation. Minimal ectasia is noted at the infrarenal  abdominal aorta, still normal in caliber. Scattered mild mural thrombus is seen along the distal descending thoracic aorta and proximal abdominal aorta, without evidence of luminal narrowing.  Scattered calcification is noted along the abdominal aorta, and at the origins of the celiac trunk, superior mesenteric artery and left renal artery. The inferior mesenteric artery is difficult to fully assess but appears grossly patent. The celiac trunk, superior mesenteric artery and bilateral renal arteries are patent, with additional calcification seen along the course of the superior mesenteric artery but no significant luminal narrowing.  An IVC filter is noted in expected position. The IVC is grossly unremarkable.  Prominence of the hepatic biliary ducts is within normal limits status post cholecystectomy. Clips are noted along the gallbladder fossa. The liver and spleen are otherwise unremarkable. The pancreas and adrenal glands are unremarkable.  Mild bilateral renal pelvicaliectasis remains within normal limits. There is no definite evidence of distal obstruction. The kidneys are mildly atrophic. Mild nonspecific perinephric stranding is noted bilaterally. There is no evidence of hydronephrosis. No renal or ureteral stones are seen.  The small bowel is unremarkable in appearance. The stomach is within normal limits. No acute vascular abnormalities are seen.  The appendix is normal in caliber, without evidence for appendicitis. Minimal diverticulosis is noted along the distal descending and proximal sigmoid colon, without evidence of diverticulitis.  The bladder is moderately distended and grossly unremarkable. The patient is status post hysterectomy. No suspicious adnexal masses are seen. The ovaries appear relatively symmetric. A small amount of free fluid in the pelvis is likely physiologic in nature. No inguinal lymphadenopathy is seen.  No acute osseous abnormalities are identified.  Review of the MIP images  confirms the above findings.  IMPRESSION: 1. No evidence of aortic dissection. No evidence of aneurysmal dilatation. 2. Scattered mild mural thrombus along the distal descending thoracic aorta and proximal abdominal aorta, without evidence of luminal narrowing. Scattered calcific atherosclerotic disease seen along the abdominal aorta and its branches; the branches of the abdominal aorta remain patent. 3. Right basilar airspace opacity, with diffuse interstitial prominence. This raises concern for pneumonia or asymmetric pulmonary edema. 4. Apparent intraluminal material at the distal esophagus is likely transient, though depending on the degree of clinical concern for a small mass, endoscopy could be considered for further evaluation. 5. Minimal diverticulosis along the distal descending and proximal sigmoid colon.   Electronically Signed   By: Garald Balding M.D.   On: 12/13/2014 04:03    EKG: Independently reviewed. Sinus tachycardia.  Assessment/Plan Active Problems:   CAP (community acquired pneumonia)   1. Abdominal pain - CT scan does not show anything acute. LFTs are mildly elevated. At this time will closely observe and have placed patient on full liquid diet. Continue pain relief medications. UA shows possible UTI. Follow urine culture. Patient on empiric antibiotics. 2. Shortness of breath - presently improved. Possible pneumonia versus CHF. Patient has been placed on antibiotics. Check BNP. Patient is also on home dose of Lasix to be continued and possible intake output. Patient's last EF measured in July 2015 was 60-65%. 3. Hypertension - continue present medication. 4. Hypothyroidism - continue present medication. 5. Dementia - continue present medication. 6. History of PE and DVT status post IVC filter placement and was not on antibiotic  correction secondary to traumatic subarachnoid hemorrhage.  Palliative team consult has been requested for goals of care.   DVT Prophylaxis  Lovenox.  Code Status: Full code.  Family Communication: Patient's daughter at the bedside.  Disposition Plan: Admit to inpatient.    Deshaun Schou N. [facility] Pager [phone].  If 7PM-7AM, please contact night-coverage [email or web address] Password [facility] 12/13/2014, 6:54 AM

## 2014-12-13 NOTE — ED Notes (Signed)
Pt having difficulty maintaining arm at a position to run fluids.  Attempting blankets to keep arm stable.  Family encouraging straightening as well.

## 2014-12-13 NOTE — ED Notes (Signed)
Family is adamant about getting patient up to bedside commode, also they are yelling for urinating assistance while patient is currently on the bedpan. Once again, explained that the patient does not follow commands, and demonstrated limitations with a second person to assist. Patient does not move limbs in efforts to sit on bedside commode, and holds herself as deadweight. Discussed with the family that the only alternative to bedpan is foley insertion, but use of bedside commode is inappropriate at this time. Allowed patient to sit on bedside commode for minimal activity and change of position. Family still at the bedside.

## 2014-12-13 NOTE — ED Notes (Signed)
An EKG was ordered by Dr. Claudine Mouton.

## 2014-12-13 NOTE — Progress Notes (Signed)
ANTIBIOTIC CONSULT NOTE - INITIAL  Pharmacy Consult for Ceftriaxone Indication: CAP  Allergies  Allergen Reactions  . Simvastatin Other (See Comments)    myalgia  . Statins Other (See Comments)    Myalgias with multiple statins   . Strawberry Swelling    Swelling ,rash  . Hydrocodone-Homatropine Nausea Only  . Nsaids Other (See Comments)    unknown  . Penicillins Rash    Patient Measurements: Height: 5\' 4"  (162.6 cm) Weight: 175 lb (79.379 kg) IBW/kg (Calculated) : 54.7  Vital Signs: Temp: 99.9 F (37.7 C) (01/15 0325) Temp Source: Oral (01/15 0325) BP: 107/38 mmHg (01/15 1215) Pulse Rate: 76 (01/15 1215) Intake/Output from previous day: 01/14 0701 - 01/15 0700 In: 1050 [I.V.:1050] Out: 2 [Urine:1; Stool:1] Intake/Output from this shift:    Labs:  Recent Labs  12/12/14 2221 12/12/14 2236 12/13/14 0054  WBC 7.7  --   --   HGB 13.4 14.6  --   PLT 251  --   --   CREATININE  --  1.10 1.19*   Estimated Creatinine Clearance: 41 mL/min (by C-G formula based on Cr of 1.19). No results for input(s): VANCOTROUGH, VANCOPEAK, VANCORANDOM, GENTTROUGH, GENTPEAK, GENTRANDOM, TOBRATROUGH, TOBRAPEAK, TOBRARND, AMIKACINPEAK, AMIKACINTROU, AMIKACIN in the last 72 hours.   Microbiology: No results found for this or any previous visit (from the past 720 hour(s)).  Medical History: Past Medical History  Diagnosis Date  . Hyperlipidemia     a. Myalgias with multiple statins.  . Hypertension   . Adenocarcinoma of breast     a. s/p right simple and left modified radical mastectomy with chemotherapy and radiation in 2002.   Marland Kitchen GERD (gastroesophageal reflux disease)   . Diverticulosis   . Hx of adenomatous colonic polyps   . DVT (deep venous thrombosis) 09-30-11    a. Right lower leg 09/2011.  . Arthritis   . Hypothyroidism   . Dementia     a. sees Dr. Brett Fairy. b. on Aricept.  Marland Kitchen History of kidney stones   . DDD (degenerative disc disease)     a. cervical and lumbar  herniation and DJD s/p multiple diskectomies and other back surgeries from 2002-2008   . Asthma   . H/O vein stripping   . History of tobacco abuse     a. quit 25+ years ago. PFTs in 2/15 were relatively normal.   . Chronic diastolic CHF (congestive heart failure)     a. ETT-myoview (9/12) with 4' exercise, EF 68%, no ischemia or infarction. b. Echo (10/14) with EF 55-60%, mod diast dysfunction, normal RV size/fcn, mod pulm HTN.   . Venous insufficiency     a. H/o venous stasis ulceration.   Marland Kitchen PAF (paroxysmal atrial fibrillation)     a. Only episode that noted was in setting of right upper lobectomy in 2014. She was briefly on amiodarone but stopped it on her own. She is not coumadin. If recurrent AF, will need to consider.  . Non-small cell lung cancer     a. s/p right upper lobectomy in 2014.   . Pulmonary HTN     a. Moderate by echo 08/2013.  Marland Kitchen Cerebral infarct     a.  Remote anterior left frontal lobe infarct by CT head 01/2014.  . PE (pulmonary embolism)   . Alzheimer's disease     Medications:  Scheduled:  . donepezil  10 mg Oral QHS  . enoxaparin (LOVENOX) injection  40 mg Subcutaneous Q24H  . furosemide  20 mg Oral BID  .  ipratropium-albuterol  3 mL Nebulization TID  . irbesartan  300 mg Oral Daily  . latanoprost  1 drop Both Eyes QHS  . [START ON 12/14/2014] levothyroxine  137 mcg Oral QAC breakfast  . morphine  30 mg Oral Q12H  . pantoprazole  40 mg Oral Daily  . potassium chloride SA  20 mEq Oral Daily  . sodium chloride  3 mL Intravenous Q12H   Infusions:  . [START ON 12/14/2014] cefTRIAXone (ROCEPHIN)  IV     Assessment: 11 YOF w/ PMH of demontia, dvt, PE, s/p IVC filter presenting to [facility] on 12/13/14 c/o intermittent lower abdominal pain.  She has been diagnosed with UTI multiples in the last 3 months.  Pharmacy has been consulted to dose ceftriaxone in the setting of CAP.  WBC wnl 7.7, afebrile, HR 76, RR 20.   UA: few bacteria, neg nitriate, Mod LE.    Plan:   - Ceftriaxone 1 g IV daily - Follow up cultures - Monitor for clinical efficacy - No renal adjustment required for ceftriaxone.  Will sign off consult.    Hassie Bruce, Pharm. D. Clinical Pharmacy Resident Pager: [phone] Ph: [phone] 12/13/2014 1:53 PM

## 2014-12-13 NOTE — ED Notes (Signed)
CT called to st pt is unable to sit still for scan.  This RN stood in room during scan in order to complete.  Able to get scan with assistance.

## 2014-12-13 NOTE — ED Notes (Signed)
Returned patient back to bed with 2nd staff assist, reapplied breathing treatment.  Discussed methods appropriate to dementia/alzheimers patients that do not create excitement or frustration. Family is tearful, participates in conversation. Verbalizes situations that cannot be explained to dementia patient and encouraged her not to get frustrated.

## 2014-12-13 NOTE — ED Provider Notes (Addendum)
CSN: [id number]     Arrival date & time 12/12/14  2124 History  This chart was scribed for Everlene Balls, MD by Hilda Lias, ED Scribe. This patient was seen in room B18C/B18C and the patient's care was started at 12:27 AM.    Chief Complaint  Patient presents with  . Abdominal Pain     The history is provided by a relative. No language interpreter was used.    HPI Comments: Alexa [name] is a 79 y.o. female with Dementia, DVT, PE s/p IVC filter, not on [facility],  who presents to the Emergency Department complaining of intermittent lower abdominal pain that has been present for a few months. Her daughter states that she was crying and in visible discomfort because her abdominal pain was so severe earlier tonight. Her daughter notes that she has had an appetite change and does not want to eat much since the onset of her abdominal pain occurred. Her daughter denies diarrhea, vomiting, or nausea or fevers.  She was diagnosed with UTI multiple times in the last 68months, treated with abx without significant relief.     Past Medical History  Diagnosis Date  . Hyperlipidemia     a. Myalgias with multiple statins.  . Hypertension   . Adenocarcinoma of breast     a. s/p right simple and left modified radical mastectomy with chemotherapy and radiation in 2002.   Marland Kitchen GERD (gastroesophageal reflux disease)   . Diverticulosis   . Hx of adenomatous colonic polyps   . DVT (deep venous thrombosis) 09-30-11    a. Right lower leg 09/2011.  . Arthritis   . Hypothyroidism   . Dementia     a. sees Dr. Brett Fairy. b. on Aricept.  Marland Kitchen History of kidney stones   . DDD (degenerative disc disease)     a. cervical and lumbar herniation and DJD s/p multiple diskectomies and other back surgeries from 2002-2008   . Asthma   . H/O vein stripping   . History of tobacco abuse     a. quit 25+ years ago. PFTs in 2/15 were relatively normal.   . Chronic diastolic CHF (congestive heart failure)     a. ETT-myoview (9/12)  with 4' exercise, EF 68%, no ischemia or infarction. b. Echo (10/14) with EF 55-60%, mod diast dysfunction, normal RV size/fcn, mod pulm HTN.   . Venous insufficiency     a. H/o venous stasis ulceration.   Marland Kitchen PAF (paroxysmal atrial fibrillation)     a. Only episode that noted was in setting of right upper lobectomy in 2014. She was briefly on amiodarone but stopped it on her own. She is not coumadin. If recurrent AF, will need to consider.  . Non-small cell lung cancer     a. s/p right upper lobectomy in 2014.   . Pulmonary HTN     a. Moderate by echo 08/2013.  Marland Kitchen Cerebral infarct     a.  Remote anterior left frontal lobe infarct by CT head 01/2014.  . PE (pulmonary embolism)   . Alzheimer's disease    Past Surgical History  Procedure Laterality Date  . Cholecystectomy  2007  . Breast surgery  2002    BL mastectomy  . Lumbar laminectomy      x3  . Rotator cuff repair Right 10  . Cervical spine surgery    . Hip surgery    . Spine surgery    . Abdominal hysterectomy    . Fiberoptic bronchoscopy with electromagnetic  10/07/2010  .  Video bronchoscopy N/A 05/14/2013    Procedure: VIDEO BRONCHOSCOPY;  Surgeon: Grace Isaac, MD;  Location: [facility] OR;  Service: Thoracic;  Laterality: N/A;  . Video assisted thoracoscopy (vats)/ lobectomy Right 05/14/2013    Procedure: VIDEO ASSISTED THORACOSCOPY (VATS)/ LOBECTOMY;  Surgeon: Grace Isaac, MD;  Location: [facility];  Service: Thoracic;  Laterality: Right;  . Colonoscopy  02-18-09    per Dr. Fuller Plan, adenomatous polyp, repeat in 5 yrs   . Neck-acdf    . Varicose vein surgery    . Hemorrhoid surgery  1985  . Kidney stone surgery  1987  . Vena cava filter placement  01/2014  . Lobectomy     Family History  Problem Relation Age of Onset  . Diabetes Daughter   . Diabetes Daughter   . Heart disease Father   . Heart disease Brother   . Dementia Brother   . Heart disease Sister   . Cancer Sister     breast  . Colon cancer Daughter 36  . Colon  polyps Daughter   . Stroke Mother   . Liver disease Daughter   . Heart attack    . Cancer Sister     breast   History  Substance Use Topics  . Smoking status: Former Smoker -- 1.00 packs/day for 25 years    Types: Cigarettes    Start date: 06/04/1959    Quit date: 05/10/1984  . Smokeless tobacco: Never Used     Comment: quit 34 years ago  . Alcohol Use: No   OB History    No data available     Review of Systems  Unable to perform ROS: Dementia      Allergies  Simvastatin; Statins; Strawberry; Hydrocodone-homatropine; Nsaids; and Penicillins  Home Medications   Prior to Admission medications   Medication Sig Start Date End Date Taking? Authorizing Provider  ciprofloxacin (CIPRO) 500 MG tablet Take 1 tablet (500 mg total) by mouth 2 (two) times daily. 10/14/14   Laurey Morale, MD  dicyclomine (BENTYL) 10 MG capsule Take 1 capsule (10 mg total) by mouth 2 (two) times daily as needed for spasms. 10/15/14   Willia Craze, NP  donepezil (ARICEPT) 10 MG tablet Take 1 tablet (10 mg total) by mouth at bedtime. 07/03/14   Asencion Partridge Dohmeier, MD  furosemide (LASIX) 40 MG tablet TAKE 1/2 TABLET BY MOUTH TWICE A DAY 10/30/14   Burnell Blanks, MD  levothyroxine (SYNTHROID, LEVOTHROID) 137 MCG tablet Take 137 mcg by mouth daily before breakfast.    Historical Provider, MD  morphine (MS CONTIN) 30 MG 12 hr tablet Take 30 mg by mouth every 12 (twelve) hours. 10/22/14   Historical Provider, MD  oxyCODONE-acetaminophen (PERCOCET) 10-325 MG per tablet Take 1 tablet by mouth every 4 (four) hours as needed (pain). 03/04/14   Sheila Oats, MD  potassium chloride SA (K-DUR,KLOR-CON) 20 MEQ tablet Take 20 mEq by mouth daily. 02/13/14   Larey Dresser, MD  promethazine (PHENERGAN) 12.5 MG tablet Take 1 tablet (12.5 mg total) by mouth 2 (two) times daily as needed for nausea or vomiting. 10/15/14   Willia Craze, NP  sulfamethoxazole-trimethoprim (BACTRIM DS) 800-160 MG per tablet Take 1  tablet by mouth 2 (two) times daily. Patient taking differently: Take 1 tablet by mouth 2 (two) times daily. D/C 10-05-14 09/23/14   Laurey Morale, MD  Tavaborole [facility]) 5 % SOLN Apply 1 drop each affected toenails once daily for 12 month 06/28/14   Richard  Sikora, DPM  temazepam (RESTORIL) 30 MG capsule TAKE ONE CAPSULE BY MOUTH AT BEDTIME AS NEEDED FOR SLEEP 10/07/14   Laurey Morale, MD  TRAVATAN Z 0.004 % SOLN ophthalmic solution  10/09/14   Historical Provider, MD  valsartan (DIOVAN) 320 MG tablet TAKE 1 TABLET BY MOUTH EVERY DAY 11/19/14   Larey Dresser, MD   BP 156/59 mmHg  Pulse 84  Temp(Src) 98 F (36.7 C) (Oral)  Resp 19  Ht 5\' 4"  (1.626 m)  Wt 175 lb (79.379 kg)  BMI 30.02 kg/m2  SpO2 99% Physical Exam  Constitutional: She is oriented to person, place, and time. She appears well-developed and well-nourished. No distress.  HENT:  Head: Normocephalic and atraumatic.  Eyes: Conjunctivae and EOM are normal. Pupils are equal, round, and reactive to light. No scleral icterus.  Neck: Normal range of motion. Neck supple. No JVD present. No tracheal deviation present. No thyromegaly present.  Cardiovascular: Normal rate, regular rhythm and normal heart sounds.  Exam reveals no gallop and no friction rub.   No murmur heard. Pulmonary/Chest: Effort normal and breath sounds normal. No respiratory distress. She has no wheezes. She exhibits no tenderness.  Abdominal: Soft. Bowel sounds are normal. She exhibits no distension and no mass. There is no tenderness. There is no rebound and no guarding.  Musculoskeletal: Normal range of motion. She exhibits no edema or tenderness.  2+ edema in both legs  Lymphadenopathy:    She has no cervical adenopathy.  Neurological: She is alert and oriented to person, place, and time.  Skin: Skin is warm and dry. Rash noted. There is erythema. No pallor.  3 cm linear errythematous rash in LLQ  Nursing note and vitals reviewed.   ED Course  Procedures  (including critical care time)  DIAGNOSTIC STUDIES: Oxygen Saturation is 99% on RA, normal by my interpretation.    COORDINATION OF CARE: 12:28 AM Discussed treatment plan with pt at bedside and pt agreed to plan.   Labs Review Labs Reviewed  URINALYSIS, ROUTINE W REFLEX MICROSCOPIC - Abnormal; Notable for the following:    Color, Urine AMBER (*)    APPearance CLOUDY (*)    Bilirubin Urine SMALL (*)    Leukocytes, UA MODERATE (*)    All other components within normal limits  COMPREHENSIVE METABOLIC PANEL - Abnormal; Notable for the following:    Glucose, Bld 103 (*)    Creatinine, Ser 1.19 (*)    AST 142 (*)    ALT 49 (*)    Alkaline Phosphatase 160 (*)    GFR calc non Af Amer 43 (*)    GFR calc Af Amer 50 (*)    All other components within normal limits  URINE MICROSCOPIC-ADD ON - Abnormal; Notable for the following:    Bacteria, UA FEW (*)    Casts HYALINE CASTS (*)    All other components within normal limits  I-STAT CHEM 8, ED - Abnormal; Notable for the following:    BUN 27 (*)    Glucose, Bld 139 (*)    All other components within normal limits  URINE CULTURE  CULTURE, BLOOD (ROUTINE X 2)  CULTURE, BLOOD (ROUTINE X 2)  CBC WITH DIFFERENTIAL  LIPASE, BLOOD  I-STAT CG4 LACTIC ACID, ED  I-STAT TROPOININ, ED    Imaging Review Ct Cta Abd/pel W/cm &/or W/o Cm  12/13/2014   CLINICAL DATA:  Acute onset of generalized abdominal pain. Initial encounter.  EXAM: CTA ABDOMEN AND PELVIS wITHOUT AND WITH CONTRAST  TECHNIQUE: Multidetector CT imaging of the abdomen and pelvis was performed using the standard protocol during bolus administration of intravenous contrast. Multiplanar reconstructed images and MIPs were obtained and reviewed to evaluate the vascular anatomy.  CONTRAST:  175mL OMNIPAQUE IOHEXOL 350 MG/ML SOLN  COMPARISON:  CT of the abdomen and pelvis performed 07/02/2014  FINDINGS: Right basilar airspace opacity is noted, with diffuse interstitial prominence. Apparent  intraluminal material at the distal esophagus is likely transient in nature.  There is no evidence of aortic dissection. There is no evidence of aneurysmal dilatation. Minimal ectasia is noted at the infrarenal abdominal aorta, still normal in caliber. Scattered mild mural thrombus is seen along the distal descending thoracic aorta and proximal abdominal aorta, without evidence of luminal narrowing.  Scattered calcification is noted along the abdominal aorta, and at the origins of the celiac trunk, superior mesenteric artery and left renal artery. The inferior mesenteric artery is difficult to fully assess but appears grossly patent. The celiac trunk, superior mesenteric artery and bilateral renal arteries are patent, with additional calcification seen along the course of the superior mesenteric artery but no significant luminal narrowing.  An IVC filter is noted in expected position. The IVC is grossly unremarkable.  Prominence of the hepatic biliary ducts is within normal limits status post cholecystectomy. Clips are noted along the gallbladder fossa. The liver and spleen are otherwise unremarkable. The pancreas and adrenal glands are unremarkable.  Mild bilateral renal pelvicaliectasis remains within normal limits. There is no definite evidence of distal obstruction. The kidneys are mildly atrophic. Mild nonspecific perinephric stranding is noted bilaterally. There is no evidence of hydronephrosis. No renal or ureteral stones are seen.  The small bowel is unremarkable in appearance. The stomach is within normal limits. No acute vascular abnormalities are seen.  The appendix is normal in caliber, without evidence for appendicitis. Minimal diverticulosis is noted along the distal descending and proximal sigmoid colon, without evidence of diverticulitis.  The bladder is moderately distended and grossly unremarkable. The patient is status post hysterectomy. No suspicious adnexal masses are seen. The ovaries appear  relatively symmetric. A small amount of free fluid in the pelvis is likely physiologic in nature. No inguinal lymphadenopathy is seen.  No acute osseous abnormalities are identified.  Review of the MIP images confirms the above findings.  IMPRESSION: 1. No evidence of aortic dissection. No evidence of aneurysmal dilatation. 2. Scattered mild mural thrombus along the distal descending thoracic aorta and proximal abdominal aorta, without evidence of luminal narrowing. Scattered calcific atherosclerotic disease seen along the abdominal aorta and its branches; the branches of the abdominal aorta remain patent. 3. Right basilar airspace opacity, with diffuse interstitial prominence. This raises concern for pneumonia or asymmetric pulmonary edema. 4. Apparent intraluminal material at the distal esophagus is likely transient, though depending on the degree of clinical concern for a small mass, endoscopy could be considered for further evaluation. 5. Minimal diverticulosis along the distal descending and proximal sigmoid colon.   Electronically Signed   By: Garald Balding M.D.   On: 12/13/2014 04:03     EKG Interpretation   Date/Time:  Friday December 13 2014 04:33:14 EST Ventricular Rate:  117 PR Interval:  176 QRS Duration: 79 QT Interval:  340 QTC Calculation: 474 R Axis:   24 Text Interpretation:  Sinus tachycardia Borderline repolarization  abnormality Confirmed by Glynn Octave [phone]) on 12/13/2014  4:41:24 AM      MDM   Final diagnoses:  None    Patient presents  to emergency department for abdominal pain, this has been intermittent for 3 months. She has a history of DVT, PE not on any anticoagulation. Patient could have a venous thrombosis causing some mesenteric ischemia. Will evaluate with CT scan. Urinalysis does show possible UTI, will treat accordingly.  CT shows mural thrombus of aorta but no evidence of AMI.  It catches a RLL pneumonia.  Upon repeat evaluation, patient became  tachycardic, mild hypoxia 92% RA, and wheezing.  This may be due to the IV contrast given vs. Her pnuemonia.  I still do not have a good cause of her lower abdominal pain. Urinalysis does reveal a UTI, however. Family states this has been treated multiple times in the past. This was discussed with [facility] hospitalist to admit the patient to telemetry unit for further diagnostics.  I personally performed the services described in this documentation, which was scribed in my presence. The recorded information has been reviewed and is accurate.    Everlene Balls, MD 12/13/14 [place], MD 12/13/14 [phone]

## 2014-12-13 NOTE — ED Notes (Signed)
Returned patient back to bed from bedside commode with 2 person assist. Patient is unable to follow commands effectively to safely use bedside commode at this time. Explained to family at the bedside,they acknowledge.

## 2014-12-13 NOTE — ED Notes (Signed)
Pt taken to radiology

## 2014-12-13 NOTE — ED Notes (Signed)
Pt placed on bedpan

## 2014-12-13 NOTE — ED Notes (Signed)
Dr. Chyrl Civatte, hospitalist, at the bedside.

## 2014-12-13 NOTE — ED Notes (Signed)
Introduced self to pt and family. Pt placed on bedpan, but unable to urinate. Hospitalist at bedside now. Denies pain. Pt comfortable.

## 2014-12-13 NOTE — Progress Notes (Signed)
Patient ID: Alexa [name]  female  HDQ:[id number]    DOB: [birth year]    DOA: 12/13/2014  PCP: Laurey Morale, MD   Brief history of present illness Alexa [name] is a 78 y.o. female with a history of dementia, diastolic CHF last EF measured was 60-65% in July 2015, paroxysmal atrial fibrillation, history of PE status post IVC filter placement secondary to a history of subarachnoid hemorrhage, hypertension, hypothyroidism was brought to the ER after patient was having abdominal pain which has worsened from her usual. As per patient's daughter who provided the history patient has chronic abdominal pain but last evening acutely worsened and was brought to the ER. CT abdomen and pelvis done which does not show anything acute abdomen but showed infiltrates in the right lower lobe of the lung. While waiting in the ER patient became acutely short of breath and was placed on nebulizer and since CAT scan done for the abdomen was showing possible pneumonia, patient has been placed on antibiotics. Patient has advanced dementia and does not contribute much to the history. But as per the daughter patient did not have any chest pain nausea vomiting diarrhea. Abdominal pain is mostly in the epigastric area.   Assessment/Plan: Principal Problem:  Acute hypoxic respiratory failure with CAP (community acquired pneumonia) - Incidentally seen on CT scan of the abdomen and pelvis - Continue IV Zithromax, Rocephin, scheduled nebs, O2  Active Problems:   Hypothyroidism -Continue Synthroid     Essential hypertension -Currently stable, continue outpatient meds, Dilantin     Dementia - Continue Aricept    Abdominal pain; unclear etiology, acute on chronic :  - CT abdomen and pelvis did not show any acute pathology, follows Dr. Hilarie Fredrickson outpatient.  - Per daughter, patient has had extensive testing in the past, no colonoscopy due to her age per GI. - Continue Bentyl, pain control, PPI, needs good bowel regimen  with long-acting pain medication MS Contin and short-acting narcotics  History of prior PE, DVT, status post IVC filter  - Patient is not on any anticoagulations secondary to traumatic  SAH  DVT Prophylaxis:  Code Status:  Family Communication:Discussed with patient's daughter at the bedside   Disposition:  Consultants:  None   Procedures:  None   Antibiotics:  IV Zithromax, Rocephin    Subjective: Patient seen and examined, denied any specific complaints at the time of my encounter except mild abdominal pain in the left lower quadrant, daughter at the bedside   Objective: Weight change:   Intake/Output Summary (Last 24 hours) at 12/13/14 1237 Last data filed at 12/13/14 1194  Gross per 24 hour  Intake   1050 ml  Output      2 ml  Net   1048 ml   Blood pressure 107/40, pulse 73, temperature 99.9 F (37.7 C), temperature source Oral, resp. rate 22, height 5\' 4"  (1.626 m), weight 79.379 kg (175 lb), SpO2 94 %.  Physical Exam: General: Alert and awake, oriented  to self, NAD CVS: S1-S2 clear, no murmur rubs or gallops Chest: clear to auscultation bilaterally, no wheezing, rales or rhonchi Abdomen: soft , mild TTP in the left lower quadrant , nondistended, normal bowel sounds  Extremities: no cyanosis, clubbing or edema noted bilaterally Neuro: Cranial nerves II-XII intact, no focal neurological deficits  Lab Results: Basic Metabolic Panel:  Recent Labs Lab 12/12/14 2236 12/13/14 0054  NA 143 140  K 3.6 3.6  CL 101 103  CO2  --  29  GLUCOSE  139* 103*  BUN 27* 21  CREATININE 1.10 1.19*  CALCIUM  --  9.0   Liver Function Tests:  Recent Labs Lab 12/13/14 0054  AST 142*  ALT 49*  ALKPHOS 160*  BILITOT 1.1  PROT 6.7  ALBUMIN 3.5    Recent Labs Lab 12/13/14 0054  LIPASE 46   No results for input(s): AMMONIA in the last 168 hours. CBC:  Recent Labs Lab 12/12/14 2221 12/12/14 2236  WBC 7.7  --   NEUTROABS 5.3  --   HGB 13.4 14.6  HCT  39.8 43.0  MCV 85.0  --   PLT 251  --    Cardiac Enzymes: No results for input(s): CKTOTAL, CKMB, CKMBINDEX, TROPONINI in the last 168 hours. BNP: Invalid input(s): POCBNP CBG: No results for input(s): GLUCAP in the last 168 hours.   Micro Results: No results found for this or any previous visit (from the past 240 hour(s)).  Studies/Results: Ct Cta Abd/pel W/cm &/or W/o Cm  12/13/2014   CLINICAL DATA:  Acute onset of generalized abdominal pain. Initial encounter.  EXAM: CTA ABDOMEN AND PELVIS wITHOUT AND WITH CONTRAST  TECHNIQUE: Multidetector CT imaging of the abdomen and pelvis was performed using the standard protocol during bolus administration of intravenous contrast. Multiplanar reconstructed images and MIPs were obtained and reviewed to evaluate the vascular anatomy.  CONTRAST:  192mL OMNIPAQUE IOHEXOL 350 MG/ML SOLN  COMPARISON:  CT of the abdomen and pelvis performed 07/02/2014  FINDINGS: Right basilar airspace opacity is noted, with diffuse interstitial prominence. Apparent intraluminal material at the distal esophagus is likely transient in nature.  There is no evidence of aortic dissection. There is no evidence of aneurysmal dilatation. Minimal ectasia is noted at the infrarenal abdominal aorta, still normal in caliber. Scattered mild mural thrombus is seen along the distal descending thoracic aorta and proximal abdominal aorta, without evidence of luminal narrowing.  Scattered calcification is noted along the abdominal aorta, and at the origins of the celiac trunk, superior mesenteric artery and left renal artery. The inferior mesenteric artery is difficult to fully assess but appears grossly patent. The celiac trunk, superior mesenteric artery and bilateral renal arteries are patent, with additional calcification seen along the course of the superior mesenteric artery but no significant luminal narrowing.  An IVC filter is noted in expected position. The IVC is grossly unremarkable.   Prominence of the hepatic biliary ducts is within normal limits status post cholecystectomy. Clips are noted along the gallbladder fossa. The liver and spleen are otherwise unremarkable. The pancreas and adrenal glands are unremarkable.  Mild bilateral renal pelvicaliectasis remains within normal limits. There is no definite evidence of distal obstruction. The kidneys are mildly atrophic. Mild nonspecific perinephric stranding is noted bilaterally. There is no evidence of hydronephrosis. No renal or ureteral stones are seen.  The small bowel is unremarkable in appearance. The stomach is within normal limits. No acute vascular abnormalities are seen.  The appendix is normal in caliber, without evidence for appendicitis. Minimal diverticulosis is noted along the distal descending and proximal sigmoid colon, without evidence of diverticulitis.  The bladder is moderately distended and grossly unremarkable. The patient is status post hysterectomy. No suspicious adnexal masses are seen. The ovaries appear relatively symmetric. A small amount of free fluid in the pelvis is likely physiologic in nature. No inguinal lymphadenopathy is seen.  No acute osseous abnormalities are identified.  Review of the MIP images confirms the above findings.  IMPRESSION: 1. No evidence of aortic dissection. No evidence  of aneurysmal dilatation. 2. Scattered mild mural thrombus along the distal descending thoracic aorta and proximal abdominal aorta, without evidence of luminal narrowing. Scattered calcific atherosclerotic disease seen along the abdominal aorta and its branches; the branches of the abdominal aorta remain patent. 3. Right basilar airspace opacity, with diffuse interstitial prominence. This raises concern for pneumonia or asymmetric pulmonary edema. 4. Apparent intraluminal material at the distal esophagus is likely transient, though depending on the degree of clinical concern for a small mass, endoscopy could be considered for  further evaluation. 5. Minimal diverticulosis along the distal descending and proximal sigmoid colon.   Electronically Signed   By: Garald Balding M.D.   On: 12/13/2014 04:03    Medications: Scheduled Meds:     LOS: 0 days   RAI,RIPUDEEP M.D. [facility] 12/13/2014, 12:37 PM Pager: [phone]  If 7PM-7AM, please contact night-coverage [email or web address] Password [email or web address]

## 2014-12-14 DIAGNOSIS — I10 Essential (primary) hypertension: Secondary | ICD-10-CM

## 2014-12-14 LAB — BASIC METABOLIC PANEL
ANION GAP: 8 (ref 5–15)
BUN: 18 mg/dL (ref 6–23)
CHLORIDE: 108 meq/L (ref 96–112)
CO2: 26 mmol/L (ref 19–32)
Calcium: 8.6 mg/dL (ref 8.4–10.5)
Creatinine, Ser: 0.95 mg/dL (ref 0.50–1.10)
GFR calc non Af Amer: 57 mL/min — ABNORMAL LOW (ref >90)
GFR, EST AFRICAN AMERICAN: 66 mL/min — AB (ref >90)
GLUCOSE: 102 mg/dL — AB (ref 70–99)
POTASSIUM: 3.7 mmol/L (ref 3.5–5.1)
SODIUM: 142 mmol/L (ref 135–145)

## 2014-12-14 LAB — CBC
HEMATOCRIT: 32.7 % — AB (ref 36.0–46.0)
HEMOGLOBIN: 10.9 g/dL — AB (ref 12.0–15.0)
MCH: 28.6 pg (ref 26.0–34.0)
MCHC: 33.3 g/dL (ref 30.0–36.0)
MCV: 85.8 fL (ref 78.0–100.0)
Platelets: 175 10*3/uL (ref 150–400)
RBC: 3.81 MIL/uL — ABNORMAL LOW (ref 3.87–5.11)
RDW: 14.4 % (ref 11.5–15.5)
WBC: 8.7 10*3/uL (ref 4.0–10.5)

## 2014-12-14 MED ORDER — POLYETHYLENE GLYCOL 3350 17 G PO PACK: 17.0000 g | PACK | Freq: Every day | ORAL | Status: DC | PRN

## 2014-12-14 MED ORDER — SACCHAROMYCES BOULARDII 250 MG PO CAPS: 250.0000 mg | ORAL_CAPSULE | Freq: Two times a day (BID) | ORAL | Status: DC

## 2014-12-14 MED ORDER — LEVOFLOXACIN 750 MG PO TABS: 750.0000 mg | ORAL_TABLET | Freq: Every day | ORAL | Status: DC

## 2014-12-14 MED ORDER — PROMETHAZINE HCL 12.5 MG PO TABS: 12.5000 mg | ORAL_TABLET | Freq: Four times a day (QID) | ORAL | Status: DC | PRN

## 2014-12-14 NOTE — Discharge Summary (Signed)
Physician Discharge Summary  Patient ID: Alexa [name] MRN: [id number] DOB/AGE: [birth year] 77 y.o.  Admit date: 12/13/2014 Discharge date: 12/14/2014  Primary Care Physician:  Laurey Morale, MD  Discharge Diagnoses:    Acute respiratory failure with hypoxia secondary to pneumonia  . CAP (community acquired pneumonia) . chronic Abdominal pain . SOB (shortness of breath) . Dementia . Essential hypertension . Hypothyroidism  Consults:  None   Recommendations for Outpatient Follow-up:  Patient was recommended to follow-up with her gastroenterologist, Dr. Hilarie Fredrickson for chronic abdominal pain workup   DIET: Heart healthy  Allergies:   Allergies  Allergen Reactions  . Simvastatin Other (See Comments)    myalgia  . Statins Other (See Comments)    Myalgias with multiple statins   . Strawberry Swelling    Swelling ,rash  . Hydrocodone-Homatropine Nausea Only  . Nsaids Other (See Comments)    unknown  . Penicillins Rash     Discharge Medications:   Medication List    STOP taking these medications        ciprofloxacin 500 MG tablet  Commonly known as:  CIPRO     sulfamethoxazole-trimethoprim 800-160 MG per tablet  Commonly known as:  BACTRIM DS     Tavaborole 5 % Soln  Commonly known as:  KERYDIN      TAKE these medications        dicyclomine 10 MG capsule  Commonly known as:  BENTYL  Take 1 capsule (10 mg total) by mouth 2 (two) times daily as needed for spasms.     donepezil 10 MG tablet  Commonly known as:  ARICEPT  Take 1 tablet (10 mg total) by mouth at bedtime.     esomeprazole 40 MG capsule  Commonly known as:  NEXIUM  Take 40 mg by mouth daily at 12 noon.     furosemide 40 MG tablet  Commonly known as:  LASIX  TAKE 1/2 TABLET BY MOUTH TWICE A DAY     levofloxacin 750 MG tablet  Commonly known as:  LEVAQUIN  Take 1 tablet (750 mg total) by mouth daily. X 5 days  Start taking on:  12/15/2014     levothyroxine 137 MCG tablet  Commonly known  as:  SYNTHROID, LEVOTHROID  Take 137 mcg by mouth daily before breakfast.     morphine 30 MG 12 hr tablet  Commonly known as:  MS CONTIN  Take 30 mg by mouth every 12 (twelve) hours.     oxyCODONE-acetaminophen 10-325 MG per tablet  Commonly known as:  PERCOCET  Take 1 tablet by mouth every 4 (four) hours as needed (pain).     polyethylene glycol packet  Commonly known as:  MIRALAX  Take 17 g by mouth daily as needed for moderate constipation (also available OTC).     potassium chloride SA 20 MEQ tablet  Commonly known as:  K-DUR,KLOR-CON  Take 20 mEq by mouth daily.     promethazine 12.5 MG tablet  Commonly known as:  PHENERGAN  Take 1 tablet (12.5 mg total) by mouth every 6 (six) hours as needed for nausea or vomiting.     saccharomyces boulardii 250 MG capsule  Commonly known as:  FLORASTOR  Take 1 capsule (250 mg total) by mouth 2 (two) times daily. Probiotic while you are on antibiotics.     temazepam 30 MG capsule  Commonly known as:  RESTORIL  TAKE ONE CAPSULE BY MOUTH AT BEDTIME AS NEEDED FOR SLEEP     TRAVATAN Z 0.004 %  Soln ophthalmic solution  Generic drug:  Travoprost (BAK Free)     valsartan 320 MG tablet  Commonly known as:  DIOVAN  TAKE 1 TABLET BY MOUTH EVERY DAY         Brief H and P: For complete details please refer to admission H and P, but in briefDonna A [name] is a 78 y.o. female with a history of dementia, diastolic CHF last EF measured was 60-65% in July 2015, paroxysmal atrial fibrillation, history of PE status post IVC filter placement secondary to a history of subarachnoid hemorrhage, hypertension, hypothyroidism was brought to the ER after patient was having abdominal pain which has worsened from her usual. As per patient's daughter who provided the history patient has chronic abdominal pain but last evening acutely worsened and was brought to the ER. CT abdomen and pelvis done which does not show anything acute abdomen but showed infiltrates  in the right lower lobe of the lung. While waiting in the ER patient became acutely short of breath and was placed on nebulizer and since CAT scan done for the abdomen was showing possible pneumonia, patient has been placed on antibiotics. Patient has advanced dementia and does not contribute much to the history. But as per the daughter patient did not have any chest pain nausea vomiting diarrhea. Abdominal pain is mostly in the epigastric area.   Hospital Course:   Acute hypoxic respiratory failure with CAP (community acquired pneumonia) Community-acquired pneumonia was incidentally seen on the CT scan of the abdomen and pelvis which was done for her chronic abdominal pain issues. She was placed on IV Zithromax, Rocephin, scheduled bronchodilators and O2 supplementation. Patient has improved significantly, ambulating.  Patient is requesting to go home, per her daughter, her dementia gets worse during hospitalization. She was discharged on oral Levaquin to complete the full course of Commit acquired pneumonia.   Hypothyroidism -Continue Synthroid   Essential hypertension -Currently stable, continue outpatient meds, Dilantin   Dementia - Continue Aricept   Abdominal pain; unclear etiology, acute on chronic :  - CT abdomen and pelvis did not show any acute pathology, follows Dr. Hilarie Fredrickson outpatient.  - Per daughter, patient has had extensive testing in the past, no colonoscopy due to her age per GI. - Continue Bentyl, pain control, PPI, needs good bowel regimen with long-acting pain medication MS Contin and short-acting narcotics  History of prior PE, DVT, status post IVC filter  - Patient is not on any anticoagulations secondary to traumatic SAH   Day of Discharge BP 146/58 mmHg  Pulse 64  Temp(Src) 98.2 F (36.8 C) (Oral)  Resp 17  Ht 5\' 4"  (1.626 m)  Wt 79.7 kg (175 lb 11.3 oz)  BMI 30.15 kg/m2  SpO2 96%  Physical Exam: General: Alert and awake oriented x3 not in any  acute distress. HEENT: anicteric sclera, pupils reactive to light and accommodation CVS: S1-S2 clear no murmur rubs or gallops Chest: clear to auscultation bilaterally, no wheezing rales or rhonchi Abdomen: soft nontender, nondistended, normal bowel sounds Extremities: no cyanosis, clubbing or edema noted bilaterally Neuro: Cranial nerves II-XII intact, no focal neurological deficits   The results of significant diagnostics from this hospitalization (including imaging, microbiology, ancillary and laboratory) are listed below for reference.    LAB RESULTS: Basic Metabolic Panel:  Recent Labs Lab 12/13/14 1435 12/14/14 0410  NA 141 142  K 3.4* 3.7  CL 103 108  CO2 25 26  GLUCOSE 215* 102*  BUN 18 18  CREATININE 1.11* 0.95  CALCIUM 8.7 8.6   Liver Function Tests:  Recent Labs Lab 12/13/14 0054 12/13/14 1435  AST 142* 132*  ALT 49* 82*  ALKPHOS 160* 147*  BILITOT 1.1 1.2  PROT 6.7 6.5  ALBUMIN 3.5 3.1*    Recent Labs Lab 12/13/14 0054 12/13/14 1435  LIPASE 46 36   No results for input(s): AMMONIA in the last 168 hours. CBC:  Recent Labs Lab 12/13/14 1435 12/14/14 0410  WBC 8.0 8.7  NEUTROABS 7.7  --   HGB 11.7* 10.9*  HCT 35.9* 32.7*  MCV 86.7 85.8  PLT 195 175   Cardiac Enzymes: No results for input(s): CKTOTAL, CKMB, CKMBINDEX, TROPONINI in the last 168 hours. BNP: Invalid input(s): POCBNP CBG: No results for input(s): GLUCAP in the last 168 hours.  Significant Diagnostic Studies:  Ct Cta Abd/pel W/cm &/or W/o Cm  12/13/2014   CLINICAL DATA:  Acute onset of generalized abdominal pain. Initial encounter.  EXAM: CTA ABDOMEN AND PELVIS wITHOUT AND WITH CONTRAST  TECHNIQUE: Multidetector CT imaging of the abdomen and pelvis was performed using the standard protocol during bolus administration of intravenous contrast. Multiplanar reconstructed images and MIPs were obtained and reviewed to evaluate the vascular anatomy.  CONTRAST:  17mL OMNIPAQUE IOHEXOL  350 MG/ML SOLN  COMPARISON:  CT of the abdomen and pelvis performed 07/02/2014  FINDINGS: Right basilar airspace opacity is noted, with diffuse interstitial prominence. Apparent intraluminal material at the distal esophagus is likely transient in nature.  There is no evidence of aortic dissection. There is no evidence of aneurysmal dilatation. Minimal ectasia is noted at the infrarenal abdominal aorta, still normal in caliber. Scattered mild mural thrombus is seen along the distal descending thoracic aorta and proximal abdominal aorta, without evidence of luminal narrowing.  Scattered calcification is noted along the abdominal aorta, and at the origins of the celiac trunk, superior mesenteric artery and left renal artery. The inferior mesenteric artery is difficult to fully assess but appears grossly patent. The celiac trunk, superior mesenteric artery and bilateral renal arteries are patent, with additional calcification seen along the course of the superior mesenteric artery but no significant luminal narrowing.  An IVC filter is noted in expected position. The IVC is grossly unremarkable.  Prominence of the hepatic biliary ducts is within normal limits status post cholecystectomy. Clips are noted along the gallbladder fossa. The liver and spleen are otherwise unremarkable. The pancreas and adrenal glands are unremarkable.  Mild bilateral renal pelvicaliectasis remains within normal limits. There is no definite evidence of distal obstruction. The kidneys are mildly atrophic. Mild nonspecific perinephric stranding is noted bilaterally. There is no evidence of hydronephrosis. No renal or ureteral stones are seen.  The small bowel is unremarkable in appearance. The stomach is within normal limits. No acute vascular abnormalities are seen.  The appendix is normal in caliber, without evidence for appendicitis. Minimal diverticulosis is noted along the distal descending and proximal sigmoid colon, without evidence of  diverticulitis.  The bladder is moderately distended and grossly unremarkable. The patient is status post hysterectomy. No suspicious adnexal masses are seen. The ovaries appear relatively symmetric. A small amount of free fluid in the pelvis is likely physiologic in nature. No inguinal lymphadenopathy is seen.  No acute osseous abnormalities are identified.  Review of the MIP images confirms the above findings.  IMPRESSION: 1. No evidence of aortic dissection. No evidence of aneurysmal dilatation. 2. Scattered mild mural thrombus along the distal descending thoracic aorta and proximal abdominal aorta, without evidence of luminal narrowing.  Scattered calcific atherosclerotic disease seen along the abdominal aorta and its branches; the branches of the abdominal aorta remain patent. 3. Right basilar airspace opacity, with diffuse interstitial prominence. This raises concern for pneumonia or asymmetric pulmonary edema. 4. Apparent intraluminal material at the distal esophagus is likely transient, though depending on the degree of clinical concern for a small mass, endoscopy could be considered for further evaluation. 5. Minimal diverticulosis along the distal descending and proximal sigmoid colon.   Electronically Signed   By: Garald Balding M.D.   On: 12/13/2014 04:03    2D ECHO:   Disposition and Follow-up:     Discharge Instructions    Diet - low sodium heart healthy    Complete by:  As directed      Increase activity slowly    Complete by:  As directed             DISPOSITION: Home   DISCHARGE FOLLOW-UP Follow-up Information    Follow up with FRY,STEPHEN A, MD. Schedule an appointment as soon as possible for a visit in 10 days.   Specialty:  Family Medicine   Why:  for hospital follow-up   Contact information:   [address] [place] [address] [phone]       Follow up with Jerene Bears, MD. Schedule an appointment as soon as possible for a visit in 2 weeks.    Specialty:  Gastroenterology   Why:  for hospital follow-up for abdominal pain    Contact information:   [address] [address] [phone]        Time spent on Discharge: 31 mins  Signed:   Shann Merrick M.D. [facility] 12/14/2014, 9:02 AM Pager: [phone]

## 2014-12-14 NOTE — Progress Notes (Signed)
IV removed per order. Discharge instructions and prescriptions given to daughter. Discharged via wheelchair to daughter's care with NT present. [name], RN

## 2014-12-15 LAB — URINE CULTURE: Colony Count: 45000

## 2014-12-19 LAB — CULTURE, BLOOD (ROUTINE X 2)
Culture: NO GROWTH
Culture: NO GROWTH

## 2015-01-13 ENCOUNTER — Other Ambulatory Visit: Payer: Self-pay

## 2015-01-13 DIAGNOSIS — F0150 Vascular dementia without behavioral disturbance: Secondary | ICD-10-CM

## 2015-01-13 MED ORDER — DONEPEZIL HCL 10 MG PO TABS: 10.0000 mg | ORAL_TABLET | Freq: Every day | ORAL | Status: DC

## 2015-01-30 ENCOUNTER — Other Ambulatory Visit: Payer: Medicare Other | Admitting: Lab

## 2015-01-30 ENCOUNTER — Ambulatory Visit: Payer: Medicare Other | Admitting: Family

## 2015-02-04 ENCOUNTER — Telehealth: Payer: Self-pay | Admitting: *Deleted

## 2015-02-04 NOTE — Telephone Encounter (Signed)
I called patient to check in.  Patient came to phone but let her daughter talk to me.  Patient's daughter reports that she is doing fairly well.  Her mental status continues to decline with dementia.  She walks around the house and asks where the bathroom is, etc.  Her daughter reports that her respiratory status is stable.  She does complain of her abdomen hurting most of the time and she takes Percocet as needed.  Patient's daughter reports that her PCP has performed several tests which have all been WNL.  We reviewed patient's upcoming appointments.  I encouraged her to call me for any questions or concerns.

## 2015-02-05 ENCOUNTER — Ambulatory Visit (INDEPENDENT_AMBULATORY_CARE_PROVIDER_SITE_OTHER): Payer: Medicare Other | Admitting: Podiatry

## 2015-02-05 ENCOUNTER — Encounter: Payer: Self-pay | Admitting: Podiatry

## 2015-02-05 VITALS — BP 153/82 | HR 66 | Resp 18

## 2015-02-05 DIAGNOSIS — L600 Ingrowing nail: Secondary | ICD-10-CM | POA: Diagnosis not present

## 2015-02-05 DIAGNOSIS — B351 Tinea unguium: Secondary | ICD-10-CM

## 2015-02-05 DIAGNOSIS — M79676 Pain in unspecified toe(s): Secondary | ICD-10-CM | POA: Diagnosis not present

## 2015-02-06 NOTE — Progress Notes (Signed)
Patient ID: Alexa [name], female   DOB: 1936/11/11, 79 y.o.   MRN: [id number]  Subjective: 79 y.o.-year-old female returns the office today with her daughther for painful, elongated, thickened toenails. Denies any redness or drainage around the nails. Her daughter states that her other daughter cut her nails last week because they were long but wanted to have the feet look at anyway. Denies any acute changes since last appointment and no new complaints today. Denies any systemic complaints such as fevers, chills, nausea, vomiting.   Objective: Awake, alert, NAD DP/PT pulses palpable, CRT less than 3 seconds Protective sensation decreased with Simms Weinstein monofilament, Achilles tendon reflex intact.  Nails hypertrophic, dystrophic, elongated, brittle, discolored 4. These nails include bilateral hallux and 5th digit. Thre is evidence of incurvation on the nail borders of these nails as well. There is tenderness overlying these nails. There is no surrounding erythema or drainage along the nail sites. No open lesions or pre-ulcerative lesions are identified. No other areas of tenderness bilateral lower extremities. No overlying edema, erythema, increased warmth. No pain with calf compression, swelling, warmth, erythema.  Assessment: Patient presents with symptomatic onychomycosis/ingrown toenails  Plan: -Treatment options including alternatives, risks, complications were discussed -Nails sharply debrided 4 without complication/bleeding. -Discussed daily foot inspection. If there are any changes, to call the office immediately.  -Follow-up in 2 months or sooner if any problems are to arise. In the meantime, encouraged to call the office with any questions, concerns, changes symptoms.

## 2015-02-14 ENCOUNTER — Encounter: Payer: Self-pay | Admitting: Family

## 2015-02-14 ENCOUNTER — Other Ambulatory Visit (HOSPITAL_BASED_OUTPATIENT_CLINIC_OR_DEPARTMENT_OTHER): Payer: Medicare Other | Admitting: Lab

## 2015-02-14 ENCOUNTER — Ambulatory Visit (HOSPITAL_BASED_OUTPATIENT_CLINIC_OR_DEPARTMENT_OTHER): Payer: Medicare Other | Admitting: Family

## 2015-02-14 VITALS — BP 176/64 | HR 61 | Temp 98.2°F | Resp 16 | Wt 176.0 lb

## 2015-02-14 DIAGNOSIS — Z853 Personal history of malignant neoplasm of breast: Secondary | ICD-10-CM

## 2015-02-14 DIAGNOSIS — C3491 Malignant neoplasm of unspecified part of right bronchus or lung: Secondary | ICD-10-CM

## 2015-02-14 DIAGNOSIS — Z85118 Personal history of other malignant neoplasm of bronchus and lung: Secondary | ICD-10-CM

## 2015-02-14 LAB — CBC WITH DIFFERENTIAL (CANCER CENTER ONLY)
BASO#: 0 10*3/uL (ref 0.0–0.2)
BASO%: 0.4 % (ref 0.0–2.0)
EOS ABS: 0.1 10*3/uL (ref 0.0–0.5)
EOS%: 1.9 % (ref 0.0–7.0)
HEMATOCRIT: 36.5 % (ref 34.8–46.6)
HGB: 12.1 g/dL (ref 11.6–15.9)
LYMPH#: 1.3 10*3/uL (ref 0.9–3.3)
LYMPH%: 25 % (ref 14.0–48.0)
MCH: 28.7 pg (ref 26.0–34.0)
MCHC: 33.2 g/dL (ref 32.0–36.0)
MCV: 87 fL (ref 81–101)
MONO#: 0.5 10*3/uL (ref 0.1–0.9)
MONO%: 9.3 % (ref 0.0–13.0)
NEUT#: 3.3 10*3/uL (ref 1.5–6.5)
NEUT%: 63.4 % (ref 39.6–80.0)
Platelets: 244 10*3/uL (ref 145–400)
RBC: 4.22 10*6/uL (ref 3.70–5.32)
RDW: 13.4 % (ref 11.1–15.7)
WBC: 5.3 10*3/uL (ref 3.9–10.0)

## 2015-02-14 LAB — COMPREHENSIVE METABOLIC PANEL
ALBUMIN: 3.7 g/dL (ref 3.5–5.2)
ALT: 8 U/L (ref 0–35)
AST: 14 U/L (ref 0–37)
Alkaline Phosphatase: 95 U/L (ref 39–117)
BUN: 16 mg/dL (ref 6–23)
CALCIUM: 8.8 mg/dL (ref 8.4–10.5)
CHLORIDE: 101 meq/L (ref 96–112)
CO2: 29 mEq/L (ref 19–32)
Creatinine, Ser: 1.01 mg/dL (ref 0.50–1.10)
Glucose, Bld: 103 mg/dL — ABNORMAL HIGH (ref 70–99)
POTASSIUM: 3.8 meq/L (ref 3.5–5.3)
Sodium: 140 mEq/L (ref 135–145)
Total Bilirubin: 1.1 mg/dL (ref 0.2–1.2)
Total Protein: 7.2 g/dL (ref 6.0–8.3)

## 2015-02-14 NOTE — Progress Notes (Signed)
Hematology and Oncology Follow Up Visit  Alexa [name] [id number] 08/06/1936 79 y.o. 02/14/2015   Principle Diagnosis:  Stage IB ( T2a N0M0) adenocarcinoma of the right lung Stage 1 (T1N0M0) ductal carcinoma of the LEFT breast -ER+/HER2 -. Stage 1 (T1N0M0) ER+/HER2- ductal ca of RIGHT breast Pulmonary embolism-diagnosed in April of 2015  Current Therapy:   Observation     Interim History:  Alexa [name] is here today with her daughter for a follow-up. She has some dementia so some information was collected from her daughter who is also her main caregiver.  She was in the ED in January again for abdominal pain. She takes Bentyl and is followed by Alexa [name]. Her last colonoscopy was in 2010 and she has a history of adenomatous polyps.  CT angio in January showed scattered mild mural thrombus along the distal descending thoracic aorta and proximal abdominal aorta. We will get a repeat scan in 3 months to re-evaluate this.  She has had no fever, chills, n/v, cough, rash, dizziness, headache, SOB, chest pain, palpitations, constipation, diarrhea, blood in urine or stool. No episodes of bleeding.  No swelling, tenderness, numbness or tingling in her extremities.  Her appetite is good and she is staying hydrated.  She still has her IVC filter in place.   Medications:    Medication List       This list is accurate as of: 02/14/15  2:57 PM.  Always use your most recent med list.               dicyclomine 10 MG capsule  Commonly known as:  BENTYL  Take 1 capsule (10 mg total) by mouth 2 (two) times daily as needed for spasms.     donepezil 10 MG tablet  Commonly known as:  ARICEPT  Take 1 tablet (10 mg total) by mouth at bedtime.     esomeprazole 40 MG capsule  Commonly known as:  NEXIUM  Take 40 mg by mouth daily at 12 noon.     furosemide 40 MG tablet  Commonly known as:  LASIX  TAKE 1/2 TABLET BY MOUTH TWICE A DAY     levothyroxine 137 MCG tablet  Commonly known as:   SYNTHROID, LEVOTHROID  Take 137 mcg by mouth daily before breakfast.     morphine 30 MG 12 hr tablet  Commonly known as:  MS CONTIN  Take 30 mg by mouth every 12 (twelve) hours.     oxyCODONE-acetaminophen 10-325 MG per tablet  Commonly known as:  PERCOCET  Take 1 tablet by mouth every 4 (four) hours as needed (pain).     polyethylene glycol packet  Commonly known as:  MIRALAX  Take 17 g by mouth daily as needed for moderate constipation (also available OTC).     potassium chloride SA 20 MEQ tablet  Commonly known as:  K-DUR,KLOR-CON  Take 20 mEq by mouth daily.     promethazine 12.5 MG tablet  Commonly known as:  PHENERGAN  Take 1 tablet (12.5 mg total) by mouth every 6 (six) hours as needed for nausea or vomiting.     temazepam 30 MG capsule  Commonly known as:  RESTORIL  TAKE ONE CAPSULE BY MOUTH AT BEDTIME AS NEEDED FOR SLEEP     terconazole 0.8 % vaginal cream  Commonly known as:  TERAZOL 3     TRAVATAN Z 0.004 % Soln ophthalmic solution  Generic drug:  Travoprost (BAK Free)     valsartan 320 MG tablet  Commonly  known as:  DIOVAN  TAKE 1 TABLET BY MOUTH EVERY DAY        Allergies:  Allergies  Allergen Reactions  . Simvastatin Other (See Comments)    myalgia  . Statins Other (See Comments)    Myalgias with multiple statins   . Strawberry Swelling    Swelling ,rash  . Hydrocodone-Homatropine Nausea Only  . Nsaids Other (See Comments)    unknown  . Penicillins Rash    Past Medical History, Surgical history, Social history, and Family History were reviewed and updated.  Review of Systems: All other 10 point review of systems is negative.   Physical Exam:  weight is 176 lb (79.833 kg). Her oral temperature is 98.2 F (36.8 C). Her blood pressure is 176/64 and her pulse is 61. Her respiration is 16.   Wt Readings from Last 3 Encounters:  02/14/15 176 lb (79.833 kg)  12/14/14 175 lb 11.3 oz (79.7 kg)  10/30/14 173 lb (78.472 kg)    Ocular: Sclerae  unicteric, pupils equal, round and reactive to light Ear-nose-throat: Oropharynx clear, dentition fair Lymphatic: No cervical or supraclavicular adenopathy Lungs no rales or rhonchi, good excursion bilaterally Heart regular rate and rhythm, no murmur appreciated Abd soft, nontender, positive bowel sounds MSK no focal spinal tenderness, no joint edema Neuro: non-focal, well-oriented, appropriate affect Breasts: No changes. No mass, lesions, rash or lymphadenopathy.   Lab Results  Component Value Date   WBC 5.3 02/14/2015   HGB 12.1 02/14/2015   HCT 36.5 02/14/2015   MCV 87 02/14/2015   PLT 244 02/14/2015   No results found for: FERRITIN, IRON, TIBC, UIBC, IRONPCTSAT Lab Results  Component Value Date   RBC 4.22 02/14/2015   No results found for: Nils Pyle [facility] Lab Results  Component Value Date   IGGSERUM 1210 08/11/2009   IGA 307 08/11/2009   IGMSERUM 178 08/11/2009   Lab Results  Component Value Date   TOTALPROTELP 7.2 08/11/2009     Chemistry      Component Value Date/Time   NA 142 12/14/2014 0410   NA 140 10/03/2014 0844   NA 145 02/14/2014 1123   K 3.7 12/14/2014 0410   K 3.5 10/03/2014 0844   K 3.7 02/14/2014 1123   CL 108 12/14/2014 0410   CL 100 10/03/2014 0844   CO2 26 12/14/2014 0410   CO2 27 10/03/2014 0844   CO2 28 02/14/2014 1123   BUN 18 12/14/2014 0410   BUN 19 10/03/2014 0844   BUN 19.7 02/14/2014 1123   CREATININE 0.95 12/14/2014 0410   CREATININE 1.4* 10/03/2014 0844   CREATININE 1.0 02/14/2014 1123      Component Value Date/Time   CALCIUM 8.6 12/14/2014 0410   CALCIUM 8.9 10/03/2014 0844   CALCIUM 9.6 02/14/2014 1123   ALKPHOS 147* 12/13/2014 1435   ALKPHOS 108* 10/03/2014 0844   ALKPHOS 104 02/14/2014 1123   AST 132* 12/13/2014 1435   AST 14 10/03/2014 0844   AST 22 02/14/2014 1123   ALT 82* 12/13/2014 1435   ALT 11 10/03/2014 0844   ALT 15 02/14/2014 1123   BILITOT 1.2 12/13/2014 1435   BILITOT 0.60  10/03/2014 0844   BILITOT 1.04 02/14/2014 1123     Impression and Plan: Alexa [name] is a 79 year old female with histpry of lung and breast cancer. Also she has history of PE diagnosed in April 2015. She is no longer on anticoagulation and has an IVC filter in place. She is doing well and has  shown no evidence of recurrence of the lung cancer or breast cancer. She was in the ED last month with abdominal pain. Her work-up was negative. She is taking her Bentyl as directed and is followed by GI. Her CBC was normal. Still awaiting results of her CMP.  We will repeat her CT angio in 3 months.  We will see her back in 4 months for labs and follow-up. I think we can just watch her for right now. There really is now much that we have to do.I am glad that her CAT scans were negative. I'm glad that her Doppler does not show any thrombus.  Eliezer Bottom, NP 3/18/20162:57 PM

## 2015-03-17 ENCOUNTER — Other Ambulatory Visit: Payer: Self-pay | Admitting: Family Medicine

## 2015-03-17 ENCOUNTER — Other Ambulatory Visit: Payer: Self-pay | Admitting: Cardiology

## 2015-03-21 ENCOUNTER — Ambulatory Visit (INDEPENDENT_AMBULATORY_CARE_PROVIDER_SITE_OTHER): Payer: Medicare Other | Admitting: Family Medicine

## 2015-03-21 ENCOUNTER — Encounter: Payer: Self-pay | Admitting: Family Medicine

## 2015-03-21 VITALS — BP 172/79 | HR 70 | Temp 98.7°F | Ht 64.0 in | Wt 181.0 lb

## 2015-03-21 DIAGNOSIS — R1084 Generalized abdominal pain: Secondary | ICD-10-CM

## 2015-03-21 DIAGNOSIS — F0391 Unspecified dementia with behavioral disturbance: Secondary | ICD-10-CM

## 2015-03-21 DIAGNOSIS — F5104 Psychophysiologic insomnia: Secondary | ICD-10-CM

## 2015-03-21 DIAGNOSIS — G4700 Insomnia, unspecified: Secondary | ICD-10-CM

## 2015-03-21 MED ORDER — ESOMEPRAZOLE MAGNESIUM 40 MG PO CPDR: 40.0000 mg | DELAYED_RELEASE_CAPSULE | Freq: Every day | ORAL | Status: DC

## 2015-03-21 MED ORDER — DOCUSATE SODIUM 100 MG PO CAPS
100.0000 mg | ORAL_CAPSULE | Freq: Two times a day (BID) | ORAL | Status: AC
Start: 2015-03-21 — End: ?

## 2015-03-21 MED ORDER — RISPERIDONE 0.25 MG PO TABS: 0.2500 mg | ORAL_TABLET | Freq: Every day | ORAL | Status: DC

## 2015-03-21 NOTE — Progress Notes (Signed)
Pre visit review using our clinic review tool, if applicable. No additional management support is needed unless otherwise documented below in the visit note. 

## 2015-03-21 NOTE — Progress Notes (Signed)
   Subjective:    Patient ID: Alexa [name], female    DOB: Aug 23, 1936, 79 y.o.   MRN: [id number]  HPI Here with her daughter for several issues. First she has a lot of trouble sleeping and it sounds like she is sun downing. She often naps during the day but is up most of the night. She gets out of bed and wanders around the house. Her daughter has set the interior alarms on in the house so that her mother sets off the alarms if she opens a door to go outside. She takes Temazepam already. Also she complains daily of abdominal pain. This has been worked up extensively with GI office visits and ER visits, but no clear cause has been identified. She had an unremarkable abdominal CT scan on 12-13-14. Her daughter thinks she may be constipated but she has no idea how often Alexa [name] has a BM.   Review of Systems  Constitutional: Negative.   Respiratory: Negative.   Cardiovascular: Negative.   Gastrointestinal: Positive for abdominal pain, constipation and abdominal distention. Negative for nausea, vomiting, diarrhea, blood in stool, anal bleeding and rectal pain.  Genitourinary: Negative.   Psychiatric/Behavioral: Positive for behavioral problems, confusion and sleep disturbance. Negative for hallucinations, dysphoric mood and agitation.       Objective:   Physical Exam  Constitutional: She appears well-developed and well-nourished.  Cardiovascular: Normal rate, regular rhythm, normal heart sounds and intact distal pulses.   Pulmonary/Chest: Effort normal and breath sounds normal.  Abdominal: Soft. Bowel sounds are normal. She exhibits no distension and no mass. There is no tenderness. There is no rebound and no guarding.  Neurological: She is alert.  Oriented only to self   Psychiatric: She has a normal mood and affect. Her behavior is normal.          Assessment & Plan:  Her dementia has disrupted her sleep patterns, and this is quite common. We will add low dose Risperdal to her Temazepam  at bedtime. She is probably chronically constipated. She already uses Miralax daily so we will add Colace 100 mg bid. Drink plenty of water

## 2015-04-04 DIAGNOSIS — Z0279 Encounter for issue of other medical certificate: Secondary | ICD-10-CM

## 2015-04-11 ENCOUNTER — Ambulatory Visit: Payer: Medicare Other | Admitting: Podiatry

## 2015-04-18 ENCOUNTER — Ambulatory Visit (INDEPENDENT_AMBULATORY_CARE_PROVIDER_SITE_OTHER): Payer: Medicare Other | Admitting: Podiatry

## 2015-04-18 ENCOUNTER — Emergency Department (HOSPITAL_COMMUNITY): Payer: Medicare Other

## 2015-04-18 ENCOUNTER — Emergency Department (HOSPITAL_COMMUNITY)
Admission: EM | Admit: 2015-04-18 | Discharge: 2015-04-19 | Disposition: A | Discharge status: Home / Self Care | Payer: Medicare Other | Attending: Emergency Medicine | Admitting: Emergency Medicine

## 2015-04-18 ENCOUNTER — Encounter: Payer: Self-pay | Admitting: Podiatry

## 2015-04-18 ENCOUNTER — Encounter (HOSPITAL_COMMUNITY): Payer: Self-pay | Admitting: Emergency Medicine

## 2015-04-18 DIAGNOSIS — R600 Localized edema: Secondary | ICD-10-CM | POA: Insufficient documentation

## 2015-04-18 DIAGNOSIS — B351 Tinea unguium: Secondary | ICD-10-CM

## 2015-04-18 DIAGNOSIS — Z79899 Other long term (current) drug therapy: Secondary | ICD-10-CM | POA: Insufficient documentation

## 2015-04-18 DIAGNOSIS — R1084 Generalized abdominal pain: Secondary | ICD-10-CM | POA: Diagnosis not present

## 2015-04-18 DIAGNOSIS — I10 Essential (primary) hypertension: Secondary | ICD-10-CM | POA: Insufficient documentation

## 2015-04-18 DIAGNOSIS — R109 Unspecified abdominal pain: Secondary | ICD-10-CM

## 2015-04-18 DIAGNOSIS — F0281 Dementia in other diseases classified elsewhere with behavioral disturbance: Secondary | ICD-10-CM | POA: Insufficient documentation

## 2015-04-18 DIAGNOSIS — R1030 Lower abdominal pain, unspecified: Secondary | ICD-10-CM | POA: Diagnosis present

## 2015-04-18 DIAGNOSIS — M79676 Pain in unspecified toe(s): Secondary | ICD-10-CM

## 2015-04-18 DIAGNOSIS — G309 Alzheimer's disease, unspecified: Secondary | ICD-10-CM | POA: Diagnosis not present

## 2015-04-18 DIAGNOSIS — Z8589 Personal history of malignant neoplasm of other organs and systems: Secondary | ICD-10-CM | POA: Diagnosis not present

## 2015-04-18 HISTORY — DX: Dementia in other diseases classified elsewhere, unspecified severity, without behavioral disturbance, psychotic disturbance, mood disturbance, and anxiety: F02.80

## 2015-04-18 HISTORY — DX: Alzheimer's disease, unspecified: G30.9

## 2015-04-18 LAB — CBC WITH DIFFERENTIAL/PLATELET
BASOS ABS: 0 10*3/uL (ref 0.0–0.1)
Basophils Relative: 1 % (ref 0–1)
EOS ABS: 0.1 10*3/uL (ref 0.0–0.7)
Eosinophils Relative: 2 % (ref 0–5)
HCT: 39.8 % (ref 36.0–46.0)
Hemoglobin: 12.9 g/dL (ref 12.0–15.0)
Lymphocytes Relative: 18 % (ref 12–46)
Lymphs Abs: 1.3 10*3/uL (ref 0.7–4.0)
MCH: 28.1 pg (ref 26.0–34.0)
MCHC: 32.4 g/dL (ref 30.0–36.0)
MCV: 86.7 fL (ref 78.0–100.0)
Monocytes Absolute: 0.4 10*3/uL (ref 0.1–1.0)
Monocytes Relative: 5 % (ref 3–12)
Neutro Abs: 5.5 10*3/uL (ref 1.7–7.7)
Neutrophils Relative %: 74 % (ref 43–77)
PLATELETS: 280 10*3/uL (ref 150–400)
RBC: 4.59 MIL/uL (ref 3.87–5.11)
RDW: 14.1 % (ref 11.5–15.5)
WBC: 7.4 10*3/uL (ref 4.0–10.5)

## 2015-04-18 LAB — COMPREHENSIVE METABOLIC PANEL
ALT: 26 U/L (ref 14–54)
ANION GAP: 11 (ref 5–15)
AST: 79 U/L — ABNORMAL HIGH (ref 15–41)
Albumin: 4 g/dL (ref 3.5–5.0)
Alkaline Phosphatase: 154 U/L — ABNORMAL HIGH (ref 38–126)
BUN: 23 mg/dL — ABNORMAL HIGH (ref 6–20)
CALCIUM: 9 mg/dL (ref 8.9–10.3)
CO2: 28 mmol/L (ref 22–32)
Chloride: 102 mmol/L (ref 101–111)
Creatinine, Ser: 1.06 mg/dL — ABNORMAL HIGH (ref 0.44–1.00)
GFR calc Af Amer: 57 mL/min — ABNORMAL LOW (ref >60)
GFR, EST NON AFRICAN AMERICAN: 49 mL/min — AB (ref >60)
Glucose, Bld: 132 mg/dL — ABNORMAL HIGH (ref 65–99)
Potassium: 3.5 mmol/L (ref 3.5–5.1)
Sodium: 141 mmol/L (ref 135–145)
TOTAL PROTEIN: 7.7 g/dL (ref 6.5–8.1)
Total Bilirubin: 0.7 mg/dL (ref 0.3–1.2)

## 2015-04-18 LAB — LIPASE, BLOOD: LIPASE: 46 U/L (ref 22–51)

## 2015-04-18 MED ORDER — IOHEXOL 300 MG/ML  SOLN
50.0000 mL | Freq: Once | INTRAMUSCULAR | Status: AC | PRN
  Administered 2015-04-18: 50 mL via ORAL

## 2015-04-18 MED ORDER — IOHEXOL 300 MG/ML  SOLN
100.0000 mL | Freq: Once | INTRAMUSCULAR | Status: AC | PRN
  Administered 2015-04-18: 100 mL via INTRAVENOUS

## 2015-04-18 MED ORDER — SODIUM CHLORIDE 0.9 % IV BOLUS (SEPSIS)
1000.0000 mL | Freq: Once | INTRAVENOUS | Status: AC
  Administered 2015-04-18: 1000 mL via INTRAVENOUS

## 2015-04-18 NOTE — ED Notes (Signed)
Pt c/o sudden onset lower middle abdominal pain x 1 hour. Pt has hx of chronic abdominal pain but per family, this is different. Pt has alzheimers. Pt A&O to baseline. Denies N/V/D, chest pain, SOB. Pt sts she was playing bingo when the pain started.

## 2015-04-18 NOTE — ED Provider Notes (Signed)
CSN: [id number]     Arrival date & time 04/18/15  2112 History   First MD Initiated Contact with Patient 04/18/15 2306     Chief Complaint  Patient presents with  . Abdominal Pain     (Consider location/radiation/quality/duration/timing/severity/associated sxs/prior Treatment) HPI  Alexa [name] is a 79 y.o. female with past medical history of hypertension, Alzheimer's disease presenting today with abdominal pain. History is obtained from daughter who is in the room. She states patient has had lower abdominal pain for the past year. She's had numerous tests performed which have not discovered etiology. She presents tonight because her abdominal pain was different and worse. Patient was doubled over in pain around 8:45 PM. She took one Percocet with mild relief. She denies any recent fevers, nausea, vomiting, diarrhea. Patient had no changes in her urination.  She does have history of UTIs in the past. There are no further complaints.   Past Medical History  Diagnosis Date  . Hypertension   . Cancer   . Alzheimer disease    History reviewed. No pertinent past surgical history. No family history on file. History  Substance Use Topics  . Smoking status: Never Smoker   . Smokeless tobacco: Not on file  . Alcohol Use: No   OB History    No data available     Review of Systems  Unable to perform ROS: Dementia      Allergies  Strawberry  Home Medications   Prior to Admission medications   Medication Sig Start Date End Date Taking? Authorizing Provider  donepezil (ARICEPT) 10 MG tablet Take 10 mg by mouth at bedtime.   Yes Historical Provider, MD  esomeprazole (NEXIUM) 40 MG capsule Take 40 mg by mouth daily at 12 noon.   Yes Historical Provider, MD  furosemide (LASIX) 40 MG tablet Take 40 mg by mouth daily.   Yes Historical Provider, MD  levothyroxine (SYNTHROID, LEVOTHROID) 175 MCG tablet Take 175 mcg by mouth daily before breakfast.   Yes Historical Provider, MD  morphine  (MS CONTIN) 30 MG 12 hr tablet Take 30 mg by mouth every 12 (twelve) hours.   Yes Historical Provider, MD  oxyCODONE-acetaminophen (PERCOCET) 10-325 MG per tablet Take 1 tablet by mouth every 4 (four) hours as needed for pain.   Yes Historical Provider, MD  potassium chloride SA (K-DUR,KLOR-CON) 20 MEQ tablet Take 20 mEq by mouth daily.   Yes Historical Provider, MD  valsartan (DIOVAN) 320 MG tablet Take 320 mg by mouth daily.   Yes Historical Provider, MD   BP 187/80 mmHg  Pulse 70  Temp(Src) 97.9 F (36.6 C) (Oral)  Resp 20  SpO2 100% Physical Exam  Constitutional: She appears well-developed and well-nourished. No distress.  HENT:  Head: Normocephalic and atraumatic.  Nose: Nose normal.  Mouth/Throat: Oropharynx is clear and moist. No oropharyngeal exudate.  Eyes: Conjunctivae and EOM are normal. Pupils are equal, round, and reactive to light. No scleral icterus.  Neck: Normal range of motion. Neck supple. No JVD present. No tracheal deviation present. No thyromegaly present.  Cardiovascular: Normal rate, regular rhythm and normal heart sounds.  Exam reveals no gallop and no friction rub.   No murmur heard. Pulmonary/Chest: Effort normal and breath sounds normal. No respiratory distress. She has no wheezes. She exhibits no tenderness.  Abdominal: Soft. Bowel sounds are normal. She exhibits no distension and no mass. There is tenderness. There is no rebound and no guarding.  Diffuse tenderness to palpation  Musculoskeletal: Normal range of  motion. She exhibits edema. She exhibits no tenderness.  Lymphadenopathy:    She has no cervical adenopathy.  Neurological: She is alert. No cranial nerve deficit. She exhibits normal muscle tone.  Skin: Skin is warm and dry. No rash noted. She is not diaphoretic. No erythema. No pallor.  Nursing note and vitals reviewed.   ED Course  Procedures (including critical care time) Labs Review Labs Reviewed  COMPREHENSIVE METABOLIC PANEL - Abnormal;  Notable for the following:    Glucose, Bld 132 (*)    BUN 23 (*)    Creatinine, Ser 1.06 (*)    AST 79 (*)    Alkaline Phosphatase 154 (*)    GFR calc non Af Amer 49 (*)    GFR calc Af Amer 57 (*)    All other components within normal limits  URINALYSIS, ROUTINE W REFLEX MICROSCOPIC - Abnormal; Notable for the following:    Specific Gravity, Urine 1.040 (*)    Leukocytes, UA TRACE (*)    All other components within normal limits  URINE MICROSCOPIC-ADD ON - Abnormal; Notable for the following:    Casts HYALINE CASTS (*)    All other components within normal limits  CBC WITH DIFFERENTIAL/PLATELET  LIPASE, BLOOD    Imaging Review Ct Abdomen Pelvis W Contrast  04/19/2015   CLINICAL DATA:  Sudden onset of lower mid abdominal pain. Initial encounter.  EXAM: CT ABDOMEN AND PELVIS WITH CONTRAST  TECHNIQUE: Multidetector CT imaging of the abdomen and pelvis was performed using the standard protocol following bolus administration of intravenous contrast.  CONTRAST:  181m OMNIPAQUE IOHEXOL 300 MG/ML  SOLN  COMPARISON:  None.  FINDINGS: Mild scarring is noted at the right lung base. Mild biatrial enlargement is noted.  The liver and spleen are unremarkable in appearance. The patient is status post cholecystectomy; prominence of the intrahepatic biliary ducts and common bile ducts remains within normal limits status post cholecystectomy. The pancreas and adrenal glands are unremarkable.  The kidneys are unremarkable in appearance. There is no evidence of hydronephrosis. No renal or ureteral stones are seen. No perinephric stranding is appreciated.  No free fluid is identified. The small bowel is unremarkable in appearance. The stomach is within normal limits. No acute vascular abnormalities are seen. An IVC filter is noted in expected position. Mild mural thrombus is noted along the abdominal aorta, with scattered calcification along the abdominal aorta.  The appendix is normal in caliber, without evidence  for appendicitis. Minimal diverticulosis is noted at the proximal sigmoid colon. The colon is unremarkable in appearance.  The bladder is mildly distended and grossly unremarkable. The prostate is normal in size. No inguinal lymphadenopathy is seen. Postoperative change is noted at the right inguinal region.  No acute osseous abnormalities are identified. Thoracolumbar spinal fusion is noted. An associated collection of fluid is noted posterior to the lumbar spine, at L3-L5, likely reflecting CSF.  IMPRESSION: 1. No acute abnormality seen to explain the patient's symptoms. 2. Mild biatrial enlargement noted. 3. Mild mural thrombus along the abdominal aorta, with scattered calcification along the abdominal aorta. 4. Minimal diverticulosis at the proximal sigmoid colon, without evidence of diverticulitis. 5. Status post thoracolumbar spinal fusion. Associated collection of fluid noted posterior to the lumbar spine, at L3-L5, likely reflecting a chronic collection of CSF.   Electronically Signed   By: JGarald BaldingM.D.   On: 04/19/2015 00:46     EKG Interpretation None      MDM   Final diagnoses:  Abdominal pain  Patient presents emergency department for sudden onset lower abdominal pain lasting approximately one hour. Her pain was mildly relieved with Percocet. Patient is not requesting any pain medication currently. Laboratory studies unremarkable, will send urinalysis to evaluate for infection. CT scan will be performed as well. Patient has had chronic abdominal pain for the past year, however CT warranted because tonight was different and worse.  I have no past records in the chart.  CT scan does not show any acute abnormalities to explain the patient's pain. Urinalysis is currently pending.   UA is negative for infection.  PAtient acute on chronic abdominal pain does not appear to have an emergent cause.  She is currently asymptomatic and appears well in the room, and is NAD.  Her VS remain  within her normal limits and she is safe for DC with PCP fu within 3 days.      Everlene Balls, MD 04/19/15 [phone]

## 2015-04-18 NOTE — ED Notes (Signed)
Attempted to collect urine, pt missed hat in toilet.

## 2015-04-19 DIAGNOSIS — R1084 Generalized abdominal pain: Secondary | ICD-10-CM | POA: Diagnosis not present

## 2015-04-19 LAB — URINALYSIS, ROUTINE W REFLEX MICROSCOPIC
BILIRUBIN URINE: NEGATIVE
GLUCOSE, UA: NEGATIVE mg/dL
Hgb urine dipstick: NEGATIVE
Ketones, ur: NEGATIVE mg/dL
NITRITE: NEGATIVE
Protein, ur: NEGATIVE mg/dL
Specific Gravity, Urine: 1.04 — ABNORMAL HIGH (ref 1.005–1.030)
UROBILINOGEN UA: 1 mg/dL (ref 0.0–1.0)
pH: 7 (ref 5.0–8.0)

## 2015-04-19 LAB — URINE MICROSCOPIC-ADD ON

## 2015-04-19 NOTE — ED Notes (Signed)
Family called for help came into room and found patient on foot stool with family holding her, patient assisted back to bed. pt. Was able to stand up with many assist. Pt. Denies any pain.

## 2015-04-19 NOTE — Discharge Instructions (Signed)
Abdominal Pain Ms. Kontz, your CT did not show a cause for your pain. See her primary care physician within 3 days for close follow-up. If any symptoms worsen come back to the emergency department immediately. Thank you. Many things can cause belly (abdominal) pain. Most times, the belly pain is not dangerous. Many cases of belly pain can be watched and treated at home. HOME CARE   Do not take medicines that help you go poop (laxatives) unless told to by your doctor.  Only take medicine as told by your doctor.  Eat or drink as told by your doctor. Your doctor will tell you if you should be on a special diet. GET HELP IF:  You do not know what is causing your belly pain.  You have belly pain while you are sick to your stomach (nauseous) or have runny poop (diarrhea).  You have pain while you pee or poop.  Your belly pain wakes you up at night.  You have belly pain that gets worse or better when you eat.  You have belly pain that gets worse when you eat fatty foods.  You have a fever. GET HELP RIGHT AWAY IF:   The pain does not go away within 2 hours.  You keep throwing up (vomiting).  The pain changes and is only in the right or left part of the belly.  You have bloody or tarry looking poop. MAKE SURE YOU:   Understand these instructions.  Will watch your condition.  Will get help right away if you are not doing well or get worse. Document Released: 05/03/2008 Document Revised: 11/20/2013 Document Reviewed: 07/25/2013 [facility] [employer] Patient Information 2015 [place], [place]. This information is not intended to replace advice given to you by your health care provider. Make sure you discuss any questions you have with your health care provider.

## 2015-04-21 NOTE — Progress Notes (Signed)
Patient ID: Alexa [name], female   DOB: 1936/03/19, 79 y.o.   MRN: [id number]  Subjective: 79 y.o.-year-old female returns the office today for painful, elongated, thickened toenails which she is unable to trim herself. Denies any redness or drainage around the nails. Denies any acute changes since last appointment and no new complaints today. Denies any systemic complaints such as fevers, chills, nausea, vomiting. Presents today with her daughter.   Objective: AAO 3, NAD DP/PT pulses palpable, CRT less than 3 seconds Protective sensation decreased with Simms Weinstein monofilament, Achilles tendon reflex intact.  Nails hypertrophic, dystrophic, elongated, brittle, discolored 10. There is tenderness overlying the nails 1-5 bilaterally. There is no surrounding erythema or drainage along the nail sites. No open lesions or pre-ulcerative lesions are identified. No other areas of tenderness bilateral lower extremities. No overlying edema, erythema, increased warmth. No pain with calf compression, swelling, warmth, erythema.  Assessment: Patient presents with symptomatic onychomycosis  Plan: -Treatment options including alternatives, risks, complications were discussed -Nails sharply debrided 10 without complication/bleeding. -Discussed daily foot inspection. If there are any changes, to call the office immediately.  -Follow-up in 3 months or sooner if any problems are to arise. In the meantime, encouraged to call the office with any questions, concerns, changes symptoms.

## 2015-04-25 ENCOUNTER — Telehealth: Payer: Self-pay | Admitting: Family Medicine

## 2015-04-25 NOTE — Telephone Encounter (Signed)
I spoke with Levander Campion and faxed paperwork to [place].

## 2015-04-25 NOTE — Telephone Encounter (Signed)
Patient's daughter Shauna Hugh is checking the status of paperwork dropped off on 04/09/15.  She would like a callback.

## 2015-04-29 ENCOUNTER — Ambulatory Visit (INDEPENDENT_AMBULATORY_CARE_PROVIDER_SITE_OTHER): Payer: Medicare Other | Admitting: Neurology

## 2015-04-29 ENCOUNTER — Other Ambulatory Visit: Payer: Self-pay | Admitting: Family Medicine

## 2015-04-29 ENCOUNTER — Encounter: Payer: Self-pay | Admitting: Neurology

## 2015-04-29 VITALS — BP 166/72 | HR 69 | Resp 16 | Ht 62.0 in | Wt 176.0 lb

## 2015-04-29 DIAGNOSIS — F0150 Vascular dementia without behavioral disturbance: Secondary | ICD-10-CM | POA: Diagnosis not present

## 2015-04-29 DIAGNOSIS — F0391 Unspecified dementia with behavioral disturbance: Secondary | ICD-10-CM

## 2015-04-29 DIAGNOSIS — F03918 Unspecified dementia, unspecified severity, with other behavioral disturbance: Secondary | ICD-10-CM | POA: Insufficient documentation

## 2015-04-29 MED ORDER — TEMAZEPAM 30 MG PO CAPS: ORAL_CAPSULE | ORAL | Status: DC

## 2015-04-29 MED ORDER — DONEPEZIL HCL 10 MG PO TABS: 10.0000 mg | ORAL_TABLET | Freq: Every morning | ORAL | Status: DC

## 2015-04-29 NOTE — Progress Notes (Signed)
PATIENT: Alexa [name] DOB: 1936/10/04  REASON FOR VISIT: follow up for dementia HISTORY FROM: patient  HISTORY OF PRESENT ILLNESS: Alexa [name] Dr. Sarajane Jews, her PCP,  Alexa [name] is a 79 year old white female with a history of a progressive dementing illness. The patient had a significant decline in her cognitive capacity in the spring of 2015 following a fall associated with head trauma and subarachnoid hemorrhage. The patient also had a DVT, and a pulmonary embolism around that time. The patient has returned to her usual baseline this point. The patient lives with 2 of her daughters. The patient has supervision 24/7. The patient does require some assistance with dressing at times. She is independent with bathing, and she can feed herself. The patient does not wander. At times, the may be some low-grade agitation. The patient is on Aricept, and she is tolerating the medication well. She returns for routine reevaluation.  04-29-15, the patient's 3 daughters are here with her, and report she shows sign of easy agitation. She mimiks the mood of the surrounding- for better or worse, as so common with  brain organic disease. She lost weight. She continues to live with her daughter , ever since 1993, when she became widowed. She is often looking for her girls, mistaken her children for siblings. MMSE and MOCA attached.  She is incontinent , stool soiled her trash can and bedroom. She dresses in 4-5 layers, her daughters are considering a day care for Adults.  She actually likes still to go swimming and enjoys.  [employer] on [place].  Lots of care taker issues to be addressed.      UPDATE 03/27/14 (CD and LL): Since last visit, She unfortunately sustained a large PE, NSTEMI, SAH on admission 3/30-4/6.  She was not anticoagulated and an IVC filter was placed. She was taken to the ER on 4/22 last week, with left-sided facial droop which resolved quickly. Head CT showed No acute  intracranial abnormalities. Chronic atrophy and small  vessel ischemic changes. 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  She has become more tearful, poor appetite, but no balance problems.  No reported problems with bowel or bladder.  She continues to live in her own home and her daughters are taking turns staying with her.  The daughter that lives with her continues to be in very poor health awaiting liver transplant.  There has been large change in personality, not really talking unless spoken to, and very labile emotions. MMSE today is 9/30, AFT 0. Alexa [name] was last seen by Dr. Laurance Flatten on 09-06-12 and her previous visit with me was on 09-21-11 the patient is again today here accompanied by her daughters were concerned about the last months when her memory loss seems to have significantly progressed and declined. In 2012 and 2013 She noted difficulties with naming objects and remembering names mainly.  She does not was not anxious or depressed at all and she appears to be in a fairly normal mood today.  The patient has a history of breast cancer with a double mastectomy for more than 6 years ago and in 2014 last year to this office was diagnosed with a non-small cell cancer of the right lung which has been surgically removed and did not require chemotherapy or radiation. But she has done physically all right except for joint pain and aches and pains there has been cognitively a lot of change.  Please note that in August 2013 an MRI of the brain had  short shoulder and a subcortical small vessel disease and an enhancing lesion in the right frontal region. It was interpreted as a right frontal developmental venous normality.  Alexa [name] has become absent minded, and often seems to stare into space. She has some twitching , and dropped objects. She is sleeping a lot more than last year, falls asleep immediately when not physically active or mentally stimulated. Her children state she " always smiles' and is in good  spirits.  Her sleep is "good" and her apetite is good, according to the patient. She is cared for by her daughters. Significant ankle edema with dystrophic skin is noted.   Memory loss, MMSE now 18 points 03-27-14.  Ankle edema the signal significant skin changes dystrophic skin changes induration of the skin, puffiness up to the knee level she had suffered a blood clot in 2012 in her left knee and she had a poorly healing wound. There has been no significant ataxia no altered nor orthostatic dizziness or lightheadedness with postural changes.  The daughters have noted that she used to have a variability of cognitive function day by day -which changed about 2 months ago. Now , she is seems to have a more decline in no longer the good days.  Stress is present as her daughter is awaiting a liver transplant. The patient is a main caretaker !   REVIEW OF SYSTEMS: Full 14 system review of systems performed and notable only for:  Activity change, appetite change, chills, hearing loss, runny nose, shortness of breath, leg swelling, daytime sleepiness, joint pain, back pain, aching muscles, walking difficuty, bruise easily, memory loss, headache, weakness, confusion, nervous/anxious   ALLERGIES: Allergies  Allergen Reactions  . Simvastatin Other (See Comments)    myalgia  . Statins Other (See Comments)    Myalgias with multiple statins   . Strawberry Swelling    Swelling ,rash  . Hydrocodone-Homatropine Nausea Only  . Nsaids Other (See Comments)    unknown  . Penicillins Rash    HOME MEDICATIONS: Outpatient Prescriptions Prior to Visit  Medication Sig Dispense Refill  . dicyclomine (BENTYL) 10 MG capsule Take 1 capsule (10 mg total) by mouth 2 (two) times daily as needed for spasms. 60 capsule 0  . docusate sodium (COLACE) 100 MG capsule Take 1 capsule (100 mg total) by mouth 2 (two) times daily. 60 capsule 11  . donepezil (ARICEPT) 10 MG tablet Take 1 tablet (10 mg total) by mouth at bedtime.  90 tablet 1  . esomeprazole (NEXIUM) 40 MG capsule Take 1 capsule (40 mg total) by mouth daily at 12 noon. 30 capsule 11  . furosemide (LASIX) 40 MG tablet TAKE 1/2 TABLET BY MOUTH TWICE A DAY (Patient taking differently: One tablet by mouth daily) 45 tablet 3  . levothyroxine (SYNTHROID, LEVOTHROID) 137 MCG tablet TAKE 1 TABLET (137 MCG TOTAL) BY MOUTH DAILY. 30 tablet 3  . morphine (MS CONTIN) 30 MG 12 hr tablet Take 30 mg by mouth every 12 (twelve) hours.  0  . oxyCODONE-acetaminophen (PERCOCET) 10-325 MG per tablet Take 1 tablet by mouth every 4 (four) hours as needed (pain).    . polyethylene glycol (MIRALAX) packet Take 17 g by mouth daily as needed for moderate constipation (also available OTC). 30 each 3  . potassium chloride SA (K-DUR,KLOR-CON) 20 MEQ tablet Take 20 mEq by mouth daily.    . promethazine (PHENERGAN) 12.5 MG tablet Take 1 tablet (12.5 mg total) by mouth every 6 (six) hours as needed for  nausea or vomiting. 30 tablet 0  . risperiDONE (RISPERDAL) 0.25 MG tablet Take 1 tablet (0.25 mg total) by mouth at bedtime. 30 tablet 5  . temazepam (RESTORIL) 30 MG capsule TAKE ONE CAPSULE BY MOUTH EVERY DAY AT BEDTIME AS NEEDED 30 capsule 5  . terconazole (TERAZOL 3) 0.8 % vaginal cream   5  . TRAVATAN Z 0.004 % SOLN ophthalmic solution     . valsartan (DIOVAN) 320 MG tablet TAKE 1 TABLET BY MOUTH EVERY DAY 30 tablet 3   No facility-administered medications prior to visit.     PHYSICAL EXAM  Filed Vitals:   04/29/15 1303  BP: 166/72  Pulse: 69  Resp: 16  Height: '5\' 2"'$  (1.575 m)  Weight: 176 lb (79.833 kg)   Body mass index is 32.18 kg/(m^2).  Generalized: Well developed, in no acute distress. She is friendly and cooperative , but significantly impaired by cognitive decline.  Head: normocephalic and atraumatic. Oropharynx benign  Neck: Supple, no carotid bruits  Cardiac: Regular rate rhythm, no murmur  Musculoskeletal: No deformity   Neurological examination  Memory  subjective described as impaired- her MMSE is down to 10 from 9 and 18 points,There is a limited attention span & concentration ability. Speech is fluent without dysarthria, dysphonia or aphasia but she perseverate.  Mood and affect are labile, tearful.  Cranial nerve  Pupils were equal round reactive to light extraocular movements were full, visual field were full on confrontational test.  She has a slight droopiness of the left eye .  Facial sensation and strength were normal. hearing was intact to finger rubbing bilaterally.  Motor: The motor testing reveals 5 / 5 strength of all 4 extremities. Rigid  symmetric motor tone is noted throughout.  Sensory: Sensory testing is intact to soft touch. Coordination: Rapid alternating movements in the fingers/hands is tested and normal. Finger-to-nose maneuver tested and normal with evidence of right to left confusion, finger anosognosia and Tremor, There is gegenhalten. Gait and station: Patient walks without assistive device. Strength within normal limits. Stance is wide based. She is walking daily several 100 yards.  Reflexes: Deep tendon reflexes are symmetric and normal bilaterally.  ASSESSMENT AND PLAN Progressive rapid cognitive decline, but in the frame of decreased memory and word finding capacity in addition there is some right to left confusion, some apraxia and some nausea. 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 The patient fell and had a CT head in the ED, [facility], which showed encephalomalacia and small SAH. No MRI is necessary now. More rapid decline concerning for a frontotemporal dementia.   Recommend Home Health RN be sent to home to assess living situation and needs. Adult day care explored.  She now needs 24 hour supervision Lives with her daughter, has 2 other daughters all part take in her care.  Family was updated on  expectations today , I am afraid that patient has less than 1.5 years to live and hospice may soon be an option.    04/29/2015, 1:15 PM [facility] [address], [place], [place] [address] [phone]  Note: This document was prepared with digital dictation and possible smart phrase technology. Any transcriptional errors that result from this process are unintentional.

## 2015-04-29 NOTE — Patient Instructions (Signed)
Alzheimer Disease Caregiver Guide Alzheimer disease is an illness that affects a person's brain. It causes a person to lose the ability to remember things and make good decisions. As the disease progresses, the person is unable to take care of himself or herself and needs more and more help to do simple tasks. Taking care of someone with Alzheimer disease can be very challenging and overwhelming.  MEMORY LOSS AND CONFUSION Memory loss and confusion is mild in the beginning stages of the disease. Both of these problems become more severe as the disease progresses. Eventually, the person will not recognize places or even close family members and friends.   Stay calm.  Respond with a short explanation. Long explanations can be overwhelming and confusing.  Avoid corrections that sound like scolding.  Try not to take it personally, even if the person forgets your name. BEHAVIOR CHANGES Behavior changes are part of the disease. The person may develop depression, anxiety, anger, hallucinations, or other behavior changes. These changes can come on suddenly and may be in response to pain, infection, changes in the environment (temperature, noise), overstimulation, or feeling lost or scared.   Try not to take behavior changes personally.  Remain calm and patient.  Do not argue or try to convince the person about a specific point. This will only make him or her more agitated.  Know that the behavior changes are part of the disease process and try to work through it. TIPS TO REDUCE FRUSTRATION  Schedule wisely by making appointments and doing daily tasks, like bathing and dressing, when the person is at his or her best.  Take your time. Simple tasks may take a lot longer, so be sure to allow for plenty of time.  Limit choices. Too many choices can be overwhelming and stressful for the person.  Involve the person in what you are doing.  Stick to a routine.  Avoid new or crowded situations, if  possible.  Use simple words, short sentences, and a calm voice. Only give one direction at a time.  Buy clothes and shoes that are easy to put on and take off.  Let people help if they offer. HOME SAFETY Keeping the home safe is very important to reduce the risk of falls and injuries.   Keep floors clear of clutter. Remove rugs, magazine racks, and floor lamps.  Keep hallways well lit.  Put a handrail and nonslip mat in the bathtub or shower.  Put childproof locks on cabinets with dangerous items, such as medicine, alcohol, guns, toxic cleaning items, sharp tools or utensils, matches, or lighters.  Place locks on doors where the person cannot easily see or reach them. This helps ensure that the person cannot wander out of the house and get lost.  Be prepared for emergencies. Keep a list of emergency phone numbers and addresses in a convenient area. PLANS FOR THE FUTURE  Do not put off talking about finances.  Talk about money management. People with Alzheimer disease have trouble managing their money as the disease gets worse.  Get help from professional advisors regarding financial and legal matters.  Do not put off talking about future care.  Choose a power of attorney. This is someone who can make decisions for the person with Alzheimer disease when he or she is no longer able to do so.  Talk about driving and when it is the right time to stop. The person's health care provider can help give advice on this matter.  Talk about  the person's living situation. If he or she lives alone, you need to make sure he or she is safe. Some people need extra help at home, and others need more care at a nursing home or care center. SUPPORT GROUPS Joining a support group can be very helpful for caregivers of people with Alzheimer disease. Some advantages to being part of a support group include:   Getting strategies to manage stress.  Sharing experiences with others.  Receiving  emotional comfort and support.  Learning new caregiving skills as the disease progresses.  Knowing what community resources are available and taking advantage of them. SEEK MEDICAL CARE IF:  The person has a fever.  The person has a sudden change in behavior that does not improve with calming strategies.  The person is unable to manage in his or her current living situation.  The person threatens you or anyone else, including himself or herself.  You are no longer able to care for the person. Document Released: 07/27/2004 Document Revised: 04/01/2014 Document Reviewed: 12/22/2011 ExitCare Patient Information 2015 [employer]. This information is not intended to replace advice given to you by your health care provider. Make sure you discuss any questions you have with your health care provider.  

## 2015-04-29 NOTE — Telephone Encounter (Signed)
Refill for 6 months. 

## 2015-05-02 ENCOUNTER — Ambulatory Visit: Payer: Medicare Other | Admitting: Neurology

## 2015-05-16 ENCOUNTER — Encounter (HOSPITAL_COMMUNITY): Payer: Self-pay

## 2015-05-16 ENCOUNTER — Other Ambulatory Visit (HOSPITAL_BASED_OUTPATIENT_CLINIC_OR_DEPARTMENT_OTHER): Payer: Medicare Other

## 2015-05-16 ENCOUNTER — Encounter: Payer: Self-pay | Admitting: Neurology

## 2015-05-16 ENCOUNTER — Ambulatory Visit (HOSPITAL_COMMUNITY)
Admission: RE | Admit: 2015-05-16 | Discharge: 2015-05-16 | Disposition: A | Discharge status: Home / Self Care | Payer: Medicare Other | Source: Ambulatory Visit | Attending: Family | Admitting: Family

## 2015-05-16 DIAGNOSIS — Z85118 Personal history of other malignant neoplasm of bronchus and lung: Secondary | ICD-10-CM

## 2015-05-16 DIAGNOSIS — C3491 Malignant neoplasm of unspecified part of right bronchus or lung: Secondary | ICD-10-CM

## 2015-05-16 DIAGNOSIS — I7409 Other arterial embolism and thrombosis of abdominal aorta: Secondary | ICD-10-CM | POA: Insufficient documentation

## 2015-05-16 DIAGNOSIS — Z853 Personal history of malignant neoplasm of breast: Secondary | ICD-10-CM | POA: Diagnosis present

## 2015-05-16 DIAGNOSIS — I7411 Embolism and thrombosis of thoracic aorta: Secondary | ICD-10-CM | POA: Insufficient documentation

## 2015-05-16 LAB — COMPREHENSIVE METABOLIC PANEL (CC13)
ALBUMIN: 3.8 g/dL (ref 3.5–5.0)
ALT: 19 U/L (ref 0–55)
ANION GAP: 9 meq/L (ref 3–11)
AST: 19 U/L (ref 5–34)
Alkaline Phosphatase: 162 U/L — ABNORMAL HIGH (ref 40–150)
BUN: 20.6 mg/dL (ref 7.0–26.0)
CHLORIDE: 104 meq/L (ref 98–109)
CO2: 31 mEq/L — ABNORMAL HIGH (ref 22–29)
Calcium: 9.5 mg/dL (ref 8.4–10.4)
Creatinine: 1.3 mg/dL — ABNORMAL HIGH (ref 0.6–1.1)
EGFR: 41 mL/min/{1.73_m2} — ABNORMAL LOW (ref >90)
Glucose: 101 mg/dl (ref 70–140)
POTASSIUM: 3.6 meq/L (ref 3.5–5.1)
Sodium: 144 mEq/L (ref 136–145)
Total Bilirubin: 1.15 mg/dL (ref 0.20–1.20)
Total Protein: 7.5 g/dL (ref 6.4–8.3)

## 2015-05-16 LAB — CBC WITH DIFFERENTIAL/PLATELET
BASO%: 1 % (ref 0.0–2.0)
BASOS ABS: 0.1 10*3/uL (ref 0.0–0.1)
EOS%: 1.6 % (ref 0.0–7.0)
Eosinophils Absolute: 0.1 10*3/uL (ref 0.0–0.5)
HEMATOCRIT: 42 % (ref 34.8–46.6)
HGB: 14 g/dL (ref 11.6–15.9)
LYMPH%: 26 % (ref 14.0–49.7)
MCH: 28.5 pg (ref 25.1–34.0)
MCHC: 33.3 g/dL (ref 31.5–36.0)
MCV: 85.6 fL (ref 79.5–101.0)
MONO#: 0.5 10*3/uL (ref 0.1–0.9)
MONO%: 8.2 % (ref 0.0–14.0)
NEUT#: 3.8 10*3/uL (ref 1.5–6.5)
NEUT%: 63.2 % (ref 38.4–76.8)
PLATELETS: 278 10*3/uL (ref 145–400)
RBC: 4.9 10*6/uL (ref 3.70–5.45)
RDW: 14.7 % — ABNORMAL HIGH (ref 11.2–14.5)
WBC: 6 10*3/uL (ref 3.9–10.3)
lymph#: 1.6 10*3/uL (ref 0.9–3.3)

## 2015-05-16 MED ORDER — IOHEXOL 350 MG/ML SOLN
100.0000 mL | Freq: Once | INTRAVENOUS | Status: AC | PRN
  Administered 2015-05-16: 80 mL via INTRAVENOUS

## 2015-05-19 ENCOUNTER — Other Ambulatory Visit: Payer: Self-pay | Admitting: Family

## 2015-05-19 ENCOUNTER — Ambulatory Visit (HOSPITAL_COMMUNITY)
Admission: RE | Admit: 2015-05-19 | Discharge: 2015-05-19 | Disposition: A | Discharge status: Home / Self Care | Payer: Medicare Other | Source: Ambulatory Visit | Attending: Family | Admitting: Family

## 2015-05-19 ENCOUNTER — Encounter (HOSPITAL_COMMUNITY): Payer: Self-pay

## 2015-05-19 DIAGNOSIS — Z87891 Personal history of nicotine dependence: Secondary | ICD-10-CM | POA: Insufficient documentation

## 2015-05-19 DIAGNOSIS — C3401 Malignant neoplasm of right main bronchus: Secondary | ICD-10-CM

## 2015-05-19 LAB — POCT I-STAT CREATININE: CREATININE: 1.2 mg/dL — AB (ref 0.44–1.00)

## 2015-05-19 MED ORDER — IOHEXOL 350 MG/ML SOLN
100.0000 mL | Freq: Once | INTRAVENOUS | Status: AC | PRN
  Administered 2015-05-19: 80 mL via INTRAVENOUS

## 2015-05-20 ENCOUNTER — Other Ambulatory Visit: Payer: Self-pay | Admitting: Neurology

## 2015-05-20 ENCOUNTER — Ambulatory Visit (HOSPITAL_COMMUNITY): Payer: Medicare Other

## 2015-05-22 ENCOUNTER — Ambulatory Visit (HOSPITAL_COMMUNITY): Payer: Medicare Other

## 2015-05-26 ENCOUNTER — Other Ambulatory Visit: Payer: Self-pay

## 2015-05-27 ENCOUNTER — Other Ambulatory Visit: Payer: Self-pay | Admitting: Hematology & Oncology

## 2015-05-27 ENCOUNTER — Other Ambulatory Visit: Payer: Self-pay | Admitting: Family

## 2015-05-27 ENCOUNTER — Ambulatory Visit (HOSPITAL_COMMUNITY): Admission: RE | Admit: 2015-05-27 | Payer: Medicare Other | Source: Ambulatory Visit

## 2015-05-27 DIAGNOSIS — C3401 Malignant neoplasm of right main bronchus: Secondary | ICD-10-CM

## 2015-06-09 ENCOUNTER — Telehealth: Payer: Self-pay | Admitting: Neurology

## 2015-06-09 NOTE — Telephone Encounter (Signed)
Betsy with [facility] received a call from patients daughter [name]) inquiring if patient is eligible for hospice or palliative care. Please call daughter and advise. Daughter can be reached at  [phone].

## 2015-06-10 ENCOUNTER — Other Ambulatory Visit: Payer: Self-pay

## 2015-06-10 DIAGNOSIS — F03918 Unspecified dementia, unspecified severity, with other behavioral disturbance: Secondary | ICD-10-CM

## 2015-06-10 DIAGNOSIS — F0391 Unspecified dementia with behavioral disturbance: Secondary | ICD-10-CM

## 2015-06-10 NOTE — Telephone Encounter (Signed)
Alexa [name] returned call. Please call and advise.

## 2015-06-10 NOTE — Telephone Encounter (Signed)
Returned [employer] call. Dr. Brett Fairy is willing to place a hospice referral for pt.   No answer, left a message asking for a call back.

## 2015-06-10 NOTE — Telephone Encounter (Signed)
Spoke to [place], pt's daughter. She is requesting the referral to hospice for her mother. Will place order. Pt's daughter expressed gratitude, and verbalized understanding that [facility] will contact them.

## 2015-06-16 ENCOUNTER — Other Ambulatory Visit: Payer: Medicare Other

## 2015-06-16 ENCOUNTER — Ambulatory Visit: Payer: Medicare Other | Admitting: Hematology & Oncology

## 2015-06-25 ENCOUNTER — Ambulatory Visit (HOSPITAL_COMMUNITY): Admission: RE | Admit: 2015-06-25 | Payer: Medicare Other | Source: Ambulatory Visit

## 2015-07-02 ENCOUNTER — Ambulatory Visit: Payer: Self-pay | Admitting: Family Medicine

## 2015-07-11 ENCOUNTER — Other Ambulatory Visit: Payer: Self-pay | Admitting: Cardiovascular Disease

## 2015-07-14 ENCOUNTER — Telehealth: Payer: Self-pay | Admitting: Hematology & Oncology

## 2015-07-14 NOTE — Telephone Encounter (Signed)
Lt mess regarding time change on 8/18 at 12

## 2015-07-16 ENCOUNTER — Other Ambulatory Visit: Payer: Self-pay | Admitting: Nurse Practitioner

## 2015-07-16 DIAGNOSIS — C3491 Malignant neoplasm of unspecified part of right bronchus or lung: Secondary | ICD-10-CM

## 2015-07-17 ENCOUNTER — Other Ambulatory Visit: Payer: Medicare Other

## 2015-07-17 ENCOUNTER — Other Ambulatory Visit: Payer: Self-pay | Admitting: Neurology

## 2015-07-17 ENCOUNTER — Ambulatory Visit: Payer: Medicare Other | Admitting: Hematology & Oncology

## 2015-07-17 ENCOUNTER — Other Ambulatory Visit: Payer: Self-pay | Admitting: Cardiovascular Disease

## 2015-07-18 ENCOUNTER — Ambulatory Visit (INDEPENDENT_AMBULATORY_CARE_PROVIDER_SITE_OTHER): Payer: Medicare Other | Admitting: Podiatry

## 2015-07-18 ENCOUNTER — Ambulatory Visit: Payer: Medicare Other | Admitting: Podiatry

## 2015-07-18 ENCOUNTER — Encounter: Payer: Self-pay | Admitting: Podiatry

## 2015-07-18 VITALS — BP 135/70 | HR 92 | Resp 17

## 2015-07-18 DIAGNOSIS — B351 Tinea unguium: Secondary | ICD-10-CM

## 2015-07-18 DIAGNOSIS — M79676 Pain in unspecified toe(s): Secondary | ICD-10-CM | POA: Diagnosis not present

## 2015-07-19 NOTE — Progress Notes (Signed)
Patient ID: Alexa [name], female   DOB: Dec 26, 1935, 79 y.o.   MRN: [id number] Complaint:  Visit Type: Patient returns to my office for continued preventative foot care services. Complaint: Patient states" my nails have grown long and thick and become painful to walk and wear shoes" . The patient presents for preventative foot care services. No changes to ROS. Patient diagnosed with peripheral neuropathy.  Podiatric Exam: Vascular: dorsalis pedis and posterior tibial pulses are palpable bilateral. Capillary return is immediate. Temperature gradient is WNL. Skin turgor WNL  Sensorium: Diminished  Semmes Weinstein monofilament test. Normal tactile sensation bilaterally. Nail Exam: Pt has thick disfigured discolored nails with subungual debris noted bilateral entire nail hallux through fifth toenails Ulcer Exam: There is no evidence of ulcer or pre-ulcerative changes or infection. Orthopedic Exam: Muscle tone and strength are WNL. No limitations in general ROM. No crepitus or effusions noted. Foot type and digits show no abnormalities. Bony prominences are unremarkable. Skin: No Porokeratosis. No infection or ulcers  Diagnosis:  Onychomycosis, , Pain in right toe, pain in left toes  Treatment & Plan Procedures and Treatment: Consent by patient was obtained for treatment procedures. The patient understood the discussion of treatment and procedures well. All questions were answered thoroughly reviewed. Debridement of mycotic and hypertrophic toenails, 1 through 5 bilateral and clearing of subungual debris. No ulceration, no infection noted.  Return Visit-Office Procedure: Patient instructed to return to the office for a follow up visit 3 months for continued evaluation and treatment.

## 2015-07-22 ENCOUNTER — Ambulatory Visit (HOSPITAL_COMMUNITY)
Admission: RE | Admit: 2015-07-22 | Discharge: 2015-07-22 | Disposition: A | Discharge status: Home / Self Care | Payer: Medicare Other | Source: Ambulatory Visit | Attending: Family | Admitting: Family

## 2015-07-22 ENCOUNTER — Encounter (HOSPITAL_COMMUNITY): Payer: Self-pay

## 2015-07-22 ENCOUNTER — Other Ambulatory Visit (HOSPITAL_BASED_OUTPATIENT_CLINIC_OR_DEPARTMENT_OTHER): Payer: Medicare Other

## 2015-07-22 DIAGNOSIS — Z853 Personal history of malignant neoplasm of breast: Secondary | ICD-10-CM | POA: Insufficient documentation

## 2015-07-22 DIAGNOSIS — C3401 Malignant neoplasm of right main bronchus: Secondary | ICD-10-CM | POA: Insufficient documentation

## 2015-07-22 DIAGNOSIS — C3491 Malignant neoplasm of unspecified part of right bronchus or lung: Secondary | ICD-10-CM

## 2015-07-22 DIAGNOSIS — Z85118 Personal history of other malignant neoplasm of bronchus and lung: Secondary | ICD-10-CM

## 2015-07-22 DIAGNOSIS — I700 Atherosclerosis of aorta: Secondary | ICD-10-CM | POA: Insufficient documentation

## 2015-07-22 DIAGNOSIS — I240 Acute coronary thrombosis not resulting in myocardial infarction: Secondary | ICD-10-CM | POA: Insufficient documentation

## 2015-07-22 DIAGNOSIS — R222 Localized swelling, mass and lump, trunk: Secondary | ICD-10-CM | POA: Diagnosis present

## 2015-07-22 LAB — CBC WITH DIFFERENTIAL/PLATELET
BASO%: 0.5 % (ref 0.0–2.0)
BASOS ABS: 0 10*3/uL (ref 0.0–0.1)
EOS%: 1.1 % (ref 0.0–7.0)
Eosinophils Absolute: 0.1 10*3/uL (ref 0.0–0.5)
HEMATOCRIT: 38.6 % (ref 34.8–46.6)
HGB: 13 g/dL (ref 11.6–15.9)
LYMPH%: 30.4 % (ref 14.0–49.7)
MCH: 29 pg (ref 25.1–34.0)
MCHC: 33.7 g/dL (ref 31.5–36.0)
MCV: 86.2 fL (ref 79.5–101.0)
MONO#: 0.6 10*3/uL (ref 0.1–0.9)
MONO%: 10.2 % (ref 0.0–14.0)
NEUT#: 3.2 10*3/uL (ref 1.5–6.5)
NEUT%: 57.8 % (ref 38.4–76.8)
PLATELETS: 295 10*3/uL (ref 145–400)
RBC: 4.48 10*6/uL (ref 3.70–5.45)
RDW: 13.6 % (ref 11.2–14.5)
WBC: 5.5 10*3/uL (ref 3.9–10.3)
lymph#: 1.7 10*3/uL (ref 0.9–3.3)

## 2015-07-22 LAB — COMPREHENSIVE METABOLIC PANEL (CC13)
ALBUMIN: 3.4 g/dL — AB (ref 3.5–5.0)
ALT: 56 U/L — ABNORMAL HIGH (ref 0–55)
AST: 44 U/L — ABNORMAL HIGH (ref 5–34)
Alkaline Phosphatase: 285 U/L — ABNORMAL HIGH (ref 40–150)
Anion Gap: 11 mEq/L (ref 3–11)
BILIRUBIN TOTAL: 0.87 mg/dL (ref 0.20–1.20)
BUN: 17.7 mg/dL (ref 7.0–26.0)
CO2: 27 meq/L (ref 22–29)
Calcium: 9.4 mg/dL (ref 8.4–10.4)
Chloride: 108 mEq/L (ref 98–109)
Creatinine: 0.9 mg/dL (ref 0.6–1.1)
EGFR: 62 mL/min/{1.73_m2} — ABNORMAL LOW (ref >90)
GLUCOSE: 90 mg/dL (ref 70–140)
POTASSIUM: 3.9 meq/L (ref 3.5–5.1)
SODIUM: 145 meq/L (ref 136–145)
Total Protein: 7.3 g/dL (ref 6.4–8.3)

## 2015-07-22 MED ORDER — IOHEXOL 300 MG/ML  SOLN
100.0000 mL | Freq: Once | INTRAMUSCULAR | Status: AC | PRN
  Administered 2015-07-22: 100 mL via INTRAVENOUS

## 2015-07-24 ENCOUNTER — Telehealth: Payer: Self-pay | Admitting: Hematology & Oncology

## 2015-07-24 ENCOUNTER — Observation Stay (HOSPITAL_COMMUNITY)
Admission: EM | Admit: 2015-07-24 | Discharge: 2015-07-26 | Disposition: A | Discharge status: Home / Self Care | Payer: Medicare Other | Attending: General Surgery | Admitting: General Surgery

## 2015-07-24 ENCOUNTER — Encounter (HOSPITAL_COMMUNITY): Payer: Self-pay

## 2015-07-24 DIAGNOSIS — Z79891 Long term (current) use of opiate analgesic: Secondary | ICD-10-CM | POA: Diagnosis not present

## 2015-07-24 DIAGNOSIS — K219 Gastro-esophageal reflux disease without esophagitis: Secondary | ICD-10-CM | POA: Insufficient documentation

## 2015-07-24 DIAGNOSIS — I272 Other secondary pulmonary hypertension: Secondary | ICD-10-CM | POA: Diagnosis not present

## 2015-07-24 DIAGNOSIS — E785 Hyperlipidemia, unspecified: Secondary | ICD-10-CM | POA: Insufficient documentation

## 2015-07-24 DIAGNOSIS — G309 Alzheimer's disease, unspecified: Secondary | ICD-10-CM | POA: Insufficient documentation

## 2015-07-24 DIAGNOSIS — J45909 Unspecified asthma, uncomplicated: Secondary | ICD-10-CM | POA: Insufficient documentation

## 2015-07-24 DIAGNOSIS — F0391 Unspecified dementia with behavioral disturbance: Secondary | ICD-10-CM | POA: Diagnosis present

## 2015-07-24 DIAGNOSIS — Z853 Personal history of malignant neoplasm of breast: Secondary | ICD-10-CM | POA: Insufficient documentation

## 2015-07-24 DIAGNOSIS — K353 Acute appendicitis with localized peritonitis: Secondary | ICD-10-CM | POA: Diagnosis not present

## 2015-07-24 DIAGNOSIS — I480 Paroxysmal atrial fibrillation: Secondary | ICD-10-CM | POA: Diagnosis not present

## 2015-07-24 DIAGNOSIS — Z9013 Acquired absence of bilateral breasts and nipples: Secondary | ICD-10-CM | POA: Diagnosis not present

## 2015-07-24 DIAGNOSIS — M1990 Unspecified osteoarthritis, unspecified site: Secondary | ICD-10-CM | POA: Diagnosis not present

## 2015-07-24 DIAGNOSIS — K3580 Unspecified acute appendicitis: Secondary | ICD-10-CM | POA: Diagnosis present

## 2015-07-24 DIAGNOSIS — F0281 Dementia in other diseases classified elsewhere with behavioral disturbance: Secondary | ICD-10-CM | POA: Insufficient documentation

## 2015-07-24 DIAGNOSIS — Z8673 Personal history of transient ischemic attack (TIA), and cerebral infarction without residual deficits: Secondary | ICD-10-CM | POA: Insufficient documentation

## 2015-07-24 DIAGNOSIS — K36 Other appendicitis: Secondary | ICD-10-CM

## 2015-07-24 DIAGNOSIS — I5032 Chronic diastolic (congestive) heart failure: Secondary | ICD-10-CM | POA: Diagnosis not present

## 2015-07-24 DIAGNOSIS — Z87891 Personal history of nicotine dependence: Secondary | ICD-10-CM | POA: Insufficient documentation

## 2015-07-24 DIAGNOSIS — E039 Hypothyroidism, unspecified: Secondary | ICD-10-CM | POA: Diagnosis not present

## 2015-07-24 DIAGNOSIS — Z86711 Personal history of pulmonary embolism: Secondary | ICD-10-CM | POA: Insufficient documentation

## 2015-07-24 DIAGNOSIS — Z85118 Personal history of other malignant neoplasm of bronchus and lung: Secondary | ICD-10-CM | POA: Insufficient documentation

## 2015-07-24 DIAGNOSIS — F03918 Unspecified dementia, unspecified severity, with other behavioral disturbance: Secondary | ICD-10-CM | POA: Diagnosis present

## 2015-07-24 DIAGNOSIS — I10 Essential (primary) hypertension: Secondary | ICD-10-CM | POA: Diagnosis not present

## 2015-07-24 DIAGNOSIS — Z902 Acquired absence of lung [part of]: Secondary | ICD-10-CM | POA: Insufficient documentation

## 2015-07-24 DIAGNOSIS — I739 Peripheral vascular disease, unspecified: Secondary | ICD-10-CM | POA: Diagnosis not present

## 2015-07-24 DIAGNOSIS — Z79899 Other long term (current) drug therapy: Secondary | ICD-10-CM | POA: Insufficient documentation

## 2015-07-24 DIAGNOSIS — R1084 Generalized abdominal pain: Secondary | ICD-10-CM | POA: Diagnosis present

## 2015-07-24 DIAGNOSIS — Z01811 Encounter for preprocedural respiratory examination: Secondary | ICD-10-CM

## 2015-07-24 HISTORY — DX: Bursopathy, unspecified: M71.9

## 2015-07-24 HISTORY — DX: Nontraumatic subarachnoid hemorrhage, unspecified: I60.9

## 2015-07-24 HISTORY — DX: Personal history of malignant neoplasm of breast: Z85.3

## 2015-07-24 HISTORY — DX: Unspecified disorder of synovium and tendon, unspecified shoulder: M67.919

## 2015-07-24 HISTORY — DX: Ventricular tachycardia: I47.2

## 2015-07-24 LAB — CBC
HCT: 39.6 % (ref 36.0–46.0)
HEMOGLOBIN: 12.9 g/dL (ref 12.0–15.0)
MCH: 28.6 pg (ref 26.0–34.0)
MCHC: 32.6 g/dL (ref 30.0–36.0)
MCV: 87.8 fL (ref 78.0–100.0)
PLATELETS: 310 10*3/uL (ref 150–400)
RBC: 4.51 MIL/uL (ref 3.87–5.11)
RDW: 13.8 % (ref 11.5–15.5)
WBC: 5.8 10*3/uL (ref 4.0–10.5)

## 2015-07-24 LAB — COMPREHENSIVE METABOLIC PANEL
ALBUMIN: 3.8 g/dL (ref 3.5–5.0)
ALK PHOS: 197 U/L — AB (ref 38–126)
ALT: 34 U/L (ref 14–54)
ANION GAP: 9 (ref 5–15)
AST: 27 U/L (ref 15–41)
BILIRUBIN TOTAL: 0.8 mg/dL (ref 0.3–1.2)
BUN: 17 mg/dL (ref 6–20)
CALCIUM: 8.8 mg/dL — AB (ref 8.9–10.3)
CO2: 29 mmol/L (ref 22–32)
CREATININE: 1.35 mg/dL — AB (ref 0.44–1.00)
Chloride: 102 mmol/L (ref 101–111)
GFR calc non Af Amer: 37 mL/min — ABNORMAL LOW (ref >60)
GFR, EST AFRICAN AMERICAN: 43 mL/min — AB (ref >60)
GLUCOSE: 111 mg/dL — AB (ref 65–99)
Potassium: 3.5 mmol/L (ref 3.5–5.1)
Sodium: 140 mmol/L (ref 135–145)
TOTAL PROTEIN: 7.5 g/dL (ref 6.5–8.1)

## 2015-07-24 LAB — LIPASE, BLOOD: Lipase: 43 U/L (ref 22–51)

## 2015-07-24 MED ORDER — ONDANSETRON HCL 4 MG/2ML IJ SOLN: 4.0000 mg | Freq: Four times a day (QID) | INTRAMUSCULAR | Status: DC | PRN

## 2015-07-24 MED ORDER — ACETAMINOPHEN 650 MG RE SUPP: 650.0000 mg | Freq: Four times a day (QID) | RECTAL | Status: DC | PRN

## 2015-07-24 MED ORDER — METRONIDAZOLE IN NACL 5-0.79 MG/ML-% IV SOLN
500.0000 mg | Freq: Three times a day (TID) | INTRAVENOUS | Status: DC
  Administered 2015-07-25 (×2): 500 mg via INTRAVENOUS
  Filled 2015-07-24 (×3): qty 100

## 2015-07-24 MED ORDER — CIPROFLOXACIN IN D5W 400 MG/200ML IV SOLN
400.0000 mg | Freq: Two times a day (BID) | INTRAVENOUS | Status: DC
  Administered 2015-07-24 – 2015-07-25 (×2): 400 mg via INTRAVENOUS
  Filled 2015-07-24 (×4): qty 200

## 2015-07-24 MED ORDER — ONDANSETRON 4 MG PO TBDP: 4.0000 mg | ORAL_TABLET | Freq: Four times a day (QID) | ORAL | Status: DC | PRN

## 2015-07-24 MED ORDER — KCL IN DEXTROSE-NACL 20-5-0.45 MEQ/L-%-% IV SOLN
INTRAVENOUS | Status: DC
  Administered 2015-07-24: 20:00:00 via INTRAVENOUS
  Administered 2015-07-26: 75 mL/h via INTRAVENOUS
  Filled 2015-07-24 (×5): qty 1000

## 2015-07-24 MED ORDER — HYDROCODONE-ACETAMINOPHEN 5-325 MG PO TABS
1.0000 | ORAL_TABLET | ORAL | Status: DC | PRN
  Administered 2015-07-24: 1 via ORAL
  Filled 2015-07-24: qty 1

## 2015-07-24 MED ORDER — ACETAMINOPHEN 325 MG PO TABS: 650.0000 mg | ORAL_TABLET | Freq: Four times a day (QID) | ORAL | Status: DC | PRN

## 2015-07-24 NOTE — Telephone Encounter (Signed)
I was called by the radiologist a day or so ago. This was in reference to the CT scan that Alexa [name] recently had. There was some concern about acute appendicitis. I tried to call her yesterday but could never get an answer.  I called her daughter Alexa [name] this afternoon. I told her what was going on.  I then spoke with Dr. Lucia Gaskins of [facility] surgery. He was able to pull up her CT scan and felt that the appendix definitely was inflamed.  Since Alexa [name] has been having some abdominal pain and symptoms, he felt that the appendix would have to come out.  He recommended that she be taken to the emergency room and one of his partners, who is on-call, would be able to see her and likely get the appendix removed.  I called back her daughter Alexa [name] and told her what the surgeon told me. She understands this. She will make sure that her mom gets to the emergency room. I told her to take Alexa [name] to the emergency room at [place] was very thankful that we called her and we'll make sure that her mom gets to the emergency room for the appropriate evaluation.  Lum Keas

## 2015-07-24 NOTE — H&P (Signed)
Alexa [name] is an 79 y.o. female.    General Surgery [facility]  Chief Complaint: abdominal pain, acute appendicitis  Primary MD:  Dr. Alysia Penna, [place] [facility] [place] Oncologist:  Dr. Burney Gauze, [facility]  HPI: Patient is a 79 year old female referred to the emergency department by her medical oncologist following a CT scan of the abdomen and pelvis on 07/22/2015. Patient had complained of several weeks of abdominal pain. She has a history of breast cancer. CT scan was obtained but the report was not initially reviewed. The oncologist contacted Dr. Alphonsa Overall today and he reviewed the CT scan and agreed that it was consistent with acute appendicitis and recommended that the patient be brought to the emergency department for admission to general surgery service.  Patient has had complaints of abdominal pain for approximately 6 months. She has not had prior imaging. Previous abdominal surgery includes laparoscopic cholecystectomy. Patient's family states that she has frequent nausea although she is eating a regular diet and had a normal lunch today. They deny fevers or chills. However the patient frequently feels cold. Patient is having normal bowel movements.  Significant past medical history as noted below. Patient is not on anticoagulation. She is undergone previous bilateral mastectomy for breast cancer.  Past Medical History  Diagnosis Date  . Alzheimer disease   . Hyperlipidemia     a. Myalgias with multiple statins.  . Hypertension   . GERD (gastroesophageal reflux disease)   . Diverticulosis   . Hx of adenomatous colonic polyps   . DVT (deep venous thrombosis) 09-30-11; 01/2014    RLE; RLE  . Hypothyroidism   . Asthma   . History of tobacco abuse     a. quit 25+ years ago. PFTs in 2/15 were relatively normal.   . Chronic diastolic CHF (congestive heart failure)     a. ETT-myoview (9/12) with 4' exercise, EF 68%, no ischemia or  infarction. b. Echo (10/14) with EF 55-60%, mod diast dysfunction, normal RV size/fcn, mod pulm HTN.   . Venous insufficiency     a. H/o venous stasis ulceration.   Marland Kitchen PAF (paroxysmal atrial fibrillation)     a. Only episode that noted was in setting of right upper lobectomy in 2014. She was briefly on amiodarone but stopped it on her own. She is not coumadin. If recurrent AF, will need to consider.  . Pulmonary HTN     a. Moderate by echo 08/2013.  Marland Kitchen Cerebral infarct     a.  Remote anterior left frontal lobe infarct by CT head 01/2014.  . PE (pulmonary embolism) 01/2014  . Pneumonia     "couple times maybe" (12/13/2014)  . Arthritis     "qwhere"  . DDD (degenerative disc disease)     a. cervical and lumbar herniation and DJD s/p multiple diskectomies and other back surgeries from 2002-2008   . Chronic lower back pain   . Alzheimer's dementia     a. sees Dr. Brett Fairy. b. on Aricept.; "stage 6" (12/13/2014)  . Kidney stones   . breast dx'd 2002    left  . Adenocarcinoma of breast     a. s/p right simple and left modified radical mastectomy with chemotherapy and radiation in 2002.   . Non-small cell lung cancer     a. s/p right upper lobectomy in 2014.     Past Surgical History  Procedure Laterality Date  . Cholecystectomy  2007  . Lumbar laminectomy  X 3  .  Shoulder open rotator cuff repair Right 1995  . Hip surgery    . Abdominal hysterectomy    . Fiberoptic bronchoscopy with electromagnetic  10/07/2010  . Video bronchoscopy N/A 05/14/2013    Procedure: VIDEO BRONCHOSCOPY;  Surgeon: Grace Isaac, MD;  Location: [facility] OR;  Service: Thoracic;  Laterality: N/A;  . Video assisted thoracoscopy (vats)/ lobectomy Right 05/14/2013    Procedure: VIDEO ASSISTED THORACOSCOPY (VATS)/ LOBECTOMY;  Surgeon: Grace Isaac, MD;  Location: [facility];  Service: Thoracic;  Laterality: Right;  . Colonoscopy  02-18-09    per Dr. Fuller Plan, adenomatous polyp, repeat in 5 yrs   . Anterior cervical  decomp/discectomy fusion  X 2?  . Varicose vein surgery Right   . Hemorrhoid surgery  1985  . Kidney stone surgery  1987  . Vena cava filter placement  01/2014  . Lobectomy    . Back surgery    . Mastectomy Bilateral 04/2001  . Breast biopsy  2002  . Lithotripsy  X 2?    Family History  Problem Relation Age of Onset  . Diabetes Daughter   . Diabetes Daughter   . Heart disease Father   . Heart disease Brother   . Dementia Brother   . Heart disease Sister   . Cancer Sister     breast  . Colon cancer Daughter 96  . Colon polyps Daughter   . Stroke Mother   . Liver disease Daughter   . Heart attack    . Cancer Sister     breast   Social History:  reports that she quit smoking about 31 years ago. Her smoking use included Cigarettes. She started smoking about 56 years ago. She has a 25 pack-year smoking history. She has never used smokeless tobacco. She reports that she does not drink alcohol or use illicit drugs.  Allergies:  Allergies  Allergen Reactions  . Simvastatin Other (See Comments)    myalgia  . Statins Other (See Comments)    Myalgias with multiple statins   . Strawberry Swelling    Swelling ,rash  . Strawberry Hives  . Hydrocodone-Homatropine Nausea Only  . Nsaids Other (See Comments)    unknown  . Penicillins Rash     (Not in a hospital admission)  Results for orders placed or performed during the hospital encounter of 07/24/15 (from the past 48 hour(s))  Lipase, blood     Status: None   Collection Time: 07/24/15  3:59 PM  Result Value Ref Range   Lipase 43 22 - 51 U/L  Comprehensive metabolic panel     Status: Abnormal   Collection Time: 07/24/15  3:59 PM  Result Value Ref Range   Sodium 140 135 - 145 mmol/L   Potassium 3.5 3.5 - 5.1 mmol/L   Chloride 102 101 - 111 mmol/L   CO2 29 22 - 32 mmol/L   Glucose, Bld 111 (H) 65 - 99 mg/dL   BUN 17 6 - 20 mg/dL   Creatinine, Ser 1.35 (H) 0.44 - 1.00 mg/dL   Calcium 8.8 (L) 8.9 - 10.3 mg/dL   Total  Protein 7.5 6.5 - 8.1 g/dL   Albumin 3.8 3.5 - 5.0 g/dL   AST 27 15 - 41 U/L   ALT 34 14 - 54 U/L   Alkaline Phosphatase 197 (H) 38 - 126 U/L   Total Bilirubin 0.8 0.3 - 1.2 mg/dL   GFR calc non Af Amer 37 (L) >60 mL/min   GFR calc Af Amer 43 (L) >60  mL/min    Comment: (NOTE) The eGFR has been calculated using the CKD EPI equation. This calculation has not been validated in all clinical situations. eGFR's persistently <60 mL/min signify possible Chronic Kidney Disease.    Anion gap 9 5 - 15  CBC     Status: None   Collection Time: 07/24/15  3:59 PM  Result Value Ref Range   WBC 5.8 4.0 - 10.5 K/uL   RBC 4.51 3.87 - 5.11 MIL/uL   Hemoglobin 12.9 12.0 - 15.0 g/dL   HCT 39.6 36.0 - 46.0 %   MCV 87.8 78.0 - 100.0 fL   MCH 28.6 26.0 - 34.0 pg   MCHC 32.6 30.0 - 36.0 g/dL   RDW 13.8 11.5 - 15.5 %   Platelets 310 150 - 400 K/uL   No results found.  Review of Systems  Constitutional: Positive for diaphoresis. Negative for fever and chills.  HENT: Negative.   Eyes: Negative.   Respiratory: Negative.   Cardiovascular: Negative.   Gastrointestinal: Positive for nausea and abdominal pain. Negative for vomiting, diarrhea and constipation.  Genitourinary: Negative.   Musculoskeletal: Negative.   Skin: Negative.   Neurological:       Alzheimer's dementia  Endo/Heme/Allergies: Negative.   Psychiatric/Behavioral: Positive for memory loss.    Blood pressure 151/54, pulse 68, temperature 98.4 F (36.9 C), temperature source Oral, resp. rate 16, SpO2 98 %. Physical Exam  Constitutional: She appears well-developed and well-nourished. No distress.  Daughter and niece at bedside  HENT:  Head: Normocephalic and atraumatic.  Right Ear: External ear normal.  Left Ear: External ear normal.  Mouth/Throat: No oropharyngeal exudate.  Eyes: Conjunctivae are normal. Pupils are equal, round, and reactive to light. No scleral icterus.  Neck: Normal range of motion. Neck supple. No tracheal  deviation present. No thyromegaly present.  Cardiovascular: Normal rate, regular rhythm and normal heart sounds.   No murmur heard. Respiratory: Effort normal and breath sounds normal. No respiratory distress. She has no wheezes.  GI: Soft. Bowel sounds are normal. She exhibits no distension and no mass. There is tenderness (mild tenderness RLQ). There is no rebound and no guarding.  Musculoskeletal: Normal range of motion. She exhibits no edema.  Neurological: She is alert.  Skin: Skin is warm and dry. She is not diaphoretic.  Psychiatric:  Mild confusion, inappropriate speech consistent with dementia     Assessment/Plan  Probable acute appendicitis, abdominal pain  I discussed this case with the emergency room physician and I have personally reviewed her laboratory studies and her CT scan of the abdomen and pelvis. I have discussed these findings at length with her daughter and her niece who were present in the examination room. While the patient does not look toxic or acutely ill, she has had ongoing symptoms of abdominal pain, nausea, and possibly chills. With a CT scan of the abdomen and pelvis showing possible acute appendicitis, I think we are obligated to treat her for this diagnosis.  Patient will be admitted to the general surgery service. We will start intravenous antibiotics this evening. Patient would like to eat at this time. We will make her nothing by mouth after midnight in anticipation of laparoscopic appendectomy to be performed in the operating room tomorrow.  I have discussed the case with the family and with the patient. They understand and agree to proceed. I will discuss the case with my partner, Dr. Jackolyn Confer, who is the physician on the general surgery service this week. He will be  the physician who will perform her surgery tomorrow.  The risks and benefits of the procedure have been discussed at length with the patient.  The patient understands the proposed  procedure, potential alternative treatments, and the course of recovery to be expected.  All of the patient's questions have been answered at this time.  The patient wishes to proceed with surgery.  Earnstine Regal, MD, [facility] Office: [place] 07/24/2015, 6:22 PM

## 2015-07-24 NOTE — ED Notes (Signed)
Patient had voided during first shift.  Urine not collected.  Patient going to admitted upstairs soon.  Patient states she will not be able to void until she gets upstairs.  Nurse notified.

## 2015-07-24 NOTE — ED Provider Notes (Signed)
CSN: [id number]     Arrival date & time 07/24/15  1517 History   First MD Initiated Contact with Patient 07/24/15 1625     Chief Complaint  Patient presents with  . Abdominal Pain  . Emesis  . Diarrhea   Level V caveat dementia  (Consider location/radiation/quality/duration/timing/severity/associated sxs/prior Treatment) HPI Patient has had intermittent abdominal painfor 5 or 6 months. She had a routine CT scan of her abdomen screening for cancer performed2 days ago which showed prominent appendix with surrounding stranding suggestive of appendicitis. Patient presently denies pain anywhere. She ate barbecue 3 hours ago she's had no vomiting. She's had intermittent diarrhea for several months. Daughter is uncertain when she had last had diarrhea. She's had no fever. No other associated symptoms.presently patient denies pain anywhere. Past Medical History  Diagnosis Date  . Alzheimer disease   . Hyperlipidemia     a. Myalgias with multiple statins.  . Hypertension   . GERD (gastroesophageal reflux disease)   . Diverticulosis   . Hx of adenomatous colonic polyps   . DVT (deep venous thrombosis) 09-30-11; 01/2014    RLE; RLE  . Hypothyroidism   . Asthma   . History of tobacco abuse     a. quit 25+ years ago. PFTs in 2/15 were relatively normal.   . Chronic diastolic CHF (congestive heart failure)     a. ETT-myoview (9/12) with 4' exercise, EF 68%, no ischemia or infarction. b. Echo (10/14) with EF 55-60%, mod diast dysfunction, normal RV size/fcn, mod pulm HTN.   . Venous insufficiency     a. H/o venous stasis ulceration.   Marland Kitchen PAF (paroxysmal atrial fibrillation)     a. Only episode that noted was in setting of right upper lobectomy in 2014. She was briefly on amiodarone but stopped it on her own. She is not coumadin. If recurrent AF, will need to consider.  . Pulmonary HTN     a. Moderate by echo 08/2013.  Marland Kitchen Cerebral infarct     a.  Remote anterior left frontal lobe infarct by CT head  01/2014.  . PE (pulmonary embolism) 01/2014  . Pneumonia     "couple times maybe" (12/13/2014)  . Arthritis     "qwhere"  . DDD (degenerative disc disease)     a. cervical and lumbar herniation and DJD s/p multiple diskectomies and other back surgeries from 2002-2008   . Chronic lower back pain   . Alzheimer's dementia     a. sees Dr. Brett Fairy. b. on Aricept.; "stage 6" (12/13/2014)  . Kidney stones   . breast dx'd 2002    left  . Adenocarcinoma of breast     a. s/p right simple and left modified radical mastectomy with chemotherapy and radiation in 2002.   . Non-small cell lung cancer     a. s/p right upper lobectomy in 2014.    Past Surgical History  Procedure Laterality Date  . Cholecystectomy  2007  . Lumbar laminectomy  X 3  . Shoulder open rotator cuff repair Right 1995  . Hip surgery    . Abdominal hysterectomy    . Fiberoptic bronchoscopy with electromagnetic  10/07/2010  . Video bronchoscopy N/A 05/14/2013    Procedure: VIDEO BRONCHOSCOPY;  Surgeon: Grace Isaac, MD;  Location: [facility] OR;  Service: Thoracic;  Laterality: N/A;  . Video assisted thoracoscopy (vats)/ lobectomy Right 05/14/2013    Procedure: VIDEO ASSISTED THORACOSCOPY (VATS)/ LOBECTOMY;  Surgeon: Grace Isaac, MD;  Location: [facility];  Service: Thoracic;  Laterality: Right;  . Colonoscopy  02-18-09    per Dr. Fuller Plan, adenomatous polyp, repeat in 5 yrs   . Anterior cervical decomp/discectomy fusion  X 2?  . Varicose vein surgery Right   . Hemorrhoid surgery  1985  . Kidney stone surgery  1987  . Vena cava filter placement  01/2014  . Lobectomy    . Back surgery    . Mastectomy Bilateral 04/2001  . Breast biopsy  2002  . Lithotripsy  X 2?   Family History  Problem Relation Age of Onset  . Diabetes Daughter   . Diabetes Daughter   . Heart disease Father   . Heart disease Brother   . Dementia Brother   . Heart disease Sister   . Cancer Sister     breast  . Colon cancer Daughter 22  . Colon polyps  Daughter   . Stroke Mother   . Liver disease Daughter   . Heart attack    . Cancer Sister     breast   Social History  Substance Use Topics  . Smoking status: Former Smoker -- 1.00 packs/day for 25 years    Types: Cigarettes    Start date: 06/04/1959    Quit date: 05/10/1984  . Smokeless tobacco: Never Used  . Alcohol Use: No   OB History    Gravida Para Term Preterm AB TAB SAB Ectopic Multiple Living   0 0 0 0 0 0 0 0       Review of Systems  Unable to perform ROS: Dementia  Cardiovascular: Positive for leg swelling.       Chronic leg edema  Gastrointestinal: Positive for abdominal pain.       Chronic abdominal pain  Musculoskeletal: Positive for arthralgias.       Chronic arthralgias, followed in pain clinic      Allergies  Simvastatin; Statins; Strawberry; Strawberry; Hydrocodone-homatropine; Nsaids; and Penicillins  Home Medications   Prior to Admission medications   Medication Sig Start Date End Date Taking? Authorizing Provider  dicyclomine (BENTYL) 10 MG capsule Take 1 capsule (10 mg total) by mouth 2 (two) times daily as needed for spasms. 10/15/14   Willia Craze, NP  docusate sodium (COLACE) 100 MG capsule Take 1 capsule (100 mg total) by mouth 2 (two) times daily. 03/21/15   Laurey Morale, MD  donepezil (ARICEPT) 10 MG tablet TAKE 1 TABLET BY MOUTH AT BEDTIME 07/18/15   Larey Seat, MD  esomeprazole (NEXIUM) 40 MG capsule Take 1 capsule (40 mg total) by mouth daily at 12 noon. 03/21/15   Laurey Morale, MD  esomeprazole (NEXIUM) 40 MG capsule Take 40 mg by mouth daily at 12 noon.    Historical Provider, MD  furosemide (LASIX) 40 MG tablet TAKE 1/2 TABLET BY MOUTH TWICE A DAY Patient taking differently: One tablet by mouth daily 10/30/14   Burnell Blanks, MD  furosemide (LASIX) 40 MG tablet Take 40 mg by mouth daily.    Historical Provider, MD  levothyroxine (SYNTHROID, LEVOTHROID) 137 MCG tablet TAKE 1 TABLET (137 MCG TOTAL) BY MOUTH DAILY. 03/18/15    Laurey Morale, MD  levothyroxine (SYNTHROID, LEVOTHROID) 175 MCG tablet Take 175 mcg by mouth daily before breakfast.    Historical Provider, MD  morphine (MS CONTIN) 30 MG 12 hr tablet Take 30 mg by mouth every 12 (twelve) hours. 10/22/14   Historical Provider, MD  morphine (MS CONTIN) 30 MG 12 hr tablet Take 30 mg by mouth every 12 (  twelve) hours.    Historical Provider, MD  oxyCODONE-acetaminophen (PERCOCET) 10-325 MG per tablet Take 1 tablet by mouth every 4 (four) hours as needed (pain). 03/04/14   Sheila Oats, MD  oxyCODONE-acetaminophen (PERCOCET) 10-325 MG per tablet Take 1 tablet by mouth every 4 (four) hours as needed for pain.    Historical Provider, MD  polyethylene glycol (MIRALAX) packet Take 17 g by mouth daily as needed for moderate constipation (also available OTC). 12/14/14   Ripudeep Krystal Eaton, MD  potassium chloride SA (K-DUR,KLOR-CON) 20 MEQ tablet Take 20 mEq by mouth daily. 02/13/14   Larey Dresser, MD  potassium chloride SA (K-DUR,KLOR-CON) 20 MEQ tablet Take 20 mEq by mouth daily.    Historical Provider, MD  promethazine (PHENERGAN) 12.5 MG tablet Take 1 tablet (12.5 mg total) by mouth every 6 (six) hours as needed for nausea or vomiting. 12/14/14   Ripudeep Krystal Eaton, MD  risperiDONE (RISPERDAL) 0.25 MG tablet Take 1 tablet (0.25 mg total) by mouth at bedtime. 03/21/15   Laurey Morale, MD  temazepam (RESTORIL) 30 MG capsule One half pill at night prn, take at the latest at 8 PM. 04/29/15   Larey Seat, MD  terconazole (TERAZOL 3) 0.8 % vaginal cream  01/12/15   Historical Provider, MD  TRAVATAN Z 0.004 % SOLN ophthalmic solution  10/09/14   Historical Provider, MD  valsartan (DIOVAN) 320 MG tablet Take 320 mg by mouth daily.    Historical Provider, MD  valsartan (DIOVAN) 320 MG tablet TAKE 1 TABLET BY MOUTH EVERY DAY 07/11/15   Larey Dresser, MD   BP 151/54 mmHg  Pulse 68  Temp(Src) 98.4 F (36.9 C) (Oral)  Resp 16  SpO2 98% Physical Exam  Constitutional: She appears  well-developed and well-nourished.  HENT:  Head: Normocephalic and atraumatic.  Eyes: Conjunctivae are normal. Pupils are equal, round, and reactive to light.  Neck: Neck supple. No tracheal deviation present. No thyromegaly present.  Cardiovascular: Normal rate and regular rhythm.   No murmur heard. Pulmonary/Chest: Effort normal and breath sounds normal.  Abdominal: Soft. Bowel sounds are normal. She exhibits no distension. There is no tenderness.  Musculoskeletal: Normal range of motion. She exhibits edema. She exhibits no tenderness.  Trace pretibial pitting edema bilaterally  Neurological: She is alert. Coordination normal.  Skin: Skin is warm and dry. No rash noted.  Psychiatric: She has a normal mood and affect.  Nursing note and vitals reviewed.   ED Course  Procedures (including critical care time) Labs Review Labs Reviewed  CBC  LIPASE, BLOOD  COMPREHENSIVE METABOLIC PANEL  URINALYSIS, ROUTINE W REFLEX MICROSCOPIC (NOT AT [facility])    Imaging Review No results found. I have personally reviewed and evaluated these images and lab results as part of my medical decision-making.   EKG Interpretation None     Dr.Gerkin's was consulted   by me and came to evaluate patient Results for orders placed or performed during the hospital encounter of 07/24/15  Lipase, blood  Result Value Ref Range   Lipase 43 22 - 51 U/L  Comprehensive metabolic panel  Result Value Ref Range   Sodium 140 135 - 145 mmol/L   Potassium 3.5 3.5 - 5.1 mmol/L   Chloride 102 101 - 111 mmol/L   CO2 29 22 - 32 mmol/L   Glucose, Bld 111 (H) 65 - 99 mg/dL   BUN 17 6 - 20 mg/dL   Creatinine, Ser 1.35 (H) 0.44 - 1.00 mg/dL   Calcium 8.8 (  L) 8.9 - 10.3 mg/dL   Total Protein 7.5 6.5 - 8.1 g/dL   Albumin 3.8 3.5 - 5.0 g/dL   AST 27 15 - 41 U/L   ALT 34 14 - 54 U/L   Alkaline Phosphatase 197 (H) 38 - 126 U/L   Total Bilirubin 0.8 0.3 - 1.2 mg/dL   GFR calc non Af Amer 37 (L) >60 mL/min   GFR calc Af  Amer 43 (L) >60 mL/min   Anion gap 9 5 - 15  CBC  Result Value Ref Range   WBC 5.8 4.0 - 10.5 K/uL   RBC 4.51 3.87 - 5.11 MIL/uL   Hemoglobin 12.9 12.0 - 15.0 g/dL   HCT 39.6 36.0 - 46.0 %   MCV 87.8 78.0 - 100.0 fL   MCH 28.6 26.0 - 34.0 pg   MCHC 32.6 30.0 - 36.0 g/dL   RDW 13.8 11.5 - 15.5 %   Platelets 310 150 - 400 K/uL   Ct Angio Abd/pel W/ And/or W/o  07/22/2015   ADDENDUM REPORT: 07/22/2015 16:30  ADDENDUM: These results were called by telephone at the time of interpretation on 07/22/2015 at 4:29 pm to Dr. Burney Gauze, who verbally acknowledged these results.   Electronically Signed   By: Markus Daft M.D.   On: 07/22/2015 16:30   07/22/2015   CLINICAL DATA:  79 year old with history of scattered mural thrombus along the distal descending thoracic aorta and proximal abdominal aorta. Diffuse generalized abdominal pain for long time. History of lung cancer.  EXAM: CT ANGIOGRAPHY ABDOMEN AND PELVIS  TECHNIQUE: Multidetector CT imaging of the abdomen and pelvis was performed using the standard protocol during bolus administration of intravenous contrast. Multiplanar reconstructed images including MIPs were obtained and reviewed to evaluate the vascular anatomy.  CONTRAST:  100 mL Omnipaque 300  COMPARISON:  12/13/2014  FINDINGS: ARTERIAL FINDINGS:  Aorta: The irregular mural thrombus in the distal descending thoracic aorta appears stable. The distal descending thoracic aorta measures 2.4 cm and stable. Mild mural thrombus in the abdominal aorta is unchanged. Infrarenal abdominal aorta measures up to 2.3 cm and unchanged.  Celiac axis: Celiac trunk and main branches are patent. The left gastric artery appears to originate from the aorta and just proximal to the celiac trunk.  Superior mesenteric: SMA is patent with mild plaque and mild narrowing at the origin.  Left renal: Left renal artery is patent without significant stenosis.  Right renal:         Right renal artery patent without stenosis.   Inferior mesenteric: Patent  Left iliac: Left iliac arteries are patent without significant plaque or stenosis.  Right iliac: Right iliac arteries are patent without significant stenosis.  Venous findings: Limited evaluation of venous structures on this arterial phase examination. Stable appearance of the IVC filter near the renal veins.  Review of the MIP images confirms the above findings.  NONVASCULAR FINDINGS:  Stable punctate nodule at the left lung base is unchanged since 07/02/2014 and likely benign. There are chronic changes at the lung bases without effusions. Chronic dilatation of the extrahepatic bile duct measuring up to 1.2 cm and likely secondary to the cholecystectomy. No acute abnormality involving the liver, pancreas or spleen. No acute abnormality in the adrenal glands or kidneys. Small cyst along the right kidney upper pole. No acute abnormality in left kidney. No significant free fluid or lymphadenopathy. Uterus has been removed. Normal appearance of the urinary bladder. The appendix is prominent measuring up to 1.1 cm and there  is mild stranding around the appendix. Findings are concerning for acute appendix inflammation. Residual adnexal or ovarian tissue. Pedicle screw and rod fixation from T10 through L4. Fusion at L5-S1 and evidence for hardware removal at L5-S1. Chronic low-density fluid collections along the laminectomy defects at L5.  IMPRESSION: The appendix is prominent with surrounding inflammatory changes. Findings are suggestive for an acute appendicitis.  Atherosclerotic disease in the abdominal aorta with areas of irregular mural thrombus. Findings have not significantly changed since 12/13/2014. No evidence for aneurysm or dissection.  Electronically Signed: By: Markus Daft M.D. On: 07/22/2015 16:12    MDM  Plan Dr. Harlow Asa to admit patient., arrange for possible surgery tomorrow .  Final diagnoses:  None    Dx appendicitis    Orlie Dakin, MD 07/24/15 1820

## 2015-07-24 NOTE — ED Notes (Signed)
Pt c/o intermittent abdominal pain and n/v/d x 5 months.  Pt is currently denying pain.  Pt had a CT x 2 days ago that resulted Acute Appendicitis.  Pt's daughter reports that they were directed to come to the ED.  Per Chart Review, [facility] is following the Pt.

## 2015-07-25 ENCOUNTER — Observation Stay (HOSPITAL_COMMUNITY): Payer: Medicare Other

## 2015-07-25 ENCOUNTER — Encounter: Payer: Self-pay | Admitting: *Deleted

## 2015-07-25 ENCOUNTER — Observation Stay (HOSPITAL_COMMUNITY): Payer: Medicare Other | Admitting: Anesthesiology

## 2015-07-25 ENCOUNTER — Encounter (HOSPITAL_COMMUNITY)
Admission: EM | Disposition: A | Discharge status: Home / Self Care | Payer: Self-pay | Source: Home / Self Care | Attending: Emergency Medicine

## 2015-07-25 ENCOUNTER — Encounter (HOSPITAL_COMMUNITY): Payer: Self-pay

## 2015-07-25 DIAGNOSIS — K353 Acute appendicitis with localized peritonitis: Secondary | ICD-10-CM | POA: Diagnosis not present

## 2015-07-25 DIAGNOSIS — G309 Alzheimer's disease, unspecified: Secondary | ICD-10-CM | POA: Diagnosis not present

## 2015-07-25 DIAGNOSIS — F0281 Dementia in other diseases classified elsewhere with behavioral disturbance: Secondary | ICD-10-CM | POA: Diagnosis not present

## 2015-07-25 DIAGNOSIS — E785 Hyperlipidemia, unspecified: Secondary | ICD-10-CM | POA: Diagnosis not present

## 2015-07-25 HISTORY — PX: LAPAROSCOPIC APPENDECTOMY: SHX408

## 2015-07-25 LAB — URINALYSIS, ROUTINE W REFLEX MICROSCOPIC
Bilirubin Urine: NEGATIVE
GLUCOSE, UA: NEGATIVE mg/dL
HGB URINE DIPSTICK: NEGATIVE
KETONES UR: NEGATIVE mg/dL
Nitrite: NEGATIVE
PH: 7 (ref 5.0–8.0)
Protein, ur: NEGATIVE mg/dL
Specific Gravity, Urine: 1.016 (ref 1.005–1.030)
Urobilinogen, UA: 0.2 mg/dL (ref 0.0–1.0)

## 2015-07-25 LAB — SURGICAL PCR SCREEN
MRSA, PCR: NEGATIVE
STAPHYLOCOCCUS AUREUS: NEGATIVE

## 2015-07-25 LAB — URINE MICROSCOPIC-ADD ON

## 2015-07-25 SURGERY — APPENDECTOMY, LAPAROSCOPIC
Anesthesia: General | Site: Abdomen

## 2015-07-25 MED ORDER — MIDAZOLAM HCL 2 MG/2ML IJ SOLN
INTRAMUSCULAR | Status: AC
  Filled 2015-07-25: qty 4

## 2015-07-25 MED ORDER — PROMETHAZINE HCL 25 MG/ML IJ SOLN: 6.2500 mg | INTRAMUSCULAR | Status: DC | PRN

## 2015-07-25 MED ORDER — LIDOCAINE HCL (CARDIAC) 20 MG/ML IV SOLN
INTRAVENOUS | Status: DC | PRN
  Administered 2015-07-25: 75 mg via INTRAVENOUS

## 2015-07-25 MED ORDER — POTASSIUM CHLORIDE CRYS ER 20 MEQ PO TBCR
20.0000 meq | EXTENDED_RELEASE_TABLET | Freq: Every day | ORAL | Status: DC
  Administered 2015-07-26: 20 meq via ORAL
  Filled 2015-07-25 (×2): qty 1

## 2015-07-25 MED ORDER — LEVOTHYROXINE SODIUM 137 MCG PO TABS
137.0000 ug | ORAL_TABLET | Freq: Every day | ORAL | Status: DC
  Administered 2015-07-26: 137 ug via ORAL
  Filled 2015-07-25 (×4): qty 1

## 2015-07-25 MED ORDER — LORAZEPAM 0.5 MG PO TABS
0.5000 mg | ORAL_TABLET | Freq: Four times a day (QID) | ORAL | Status: DC | PRN
  Administered 2015-07-25: 0.5 mg via ORAL
  Filled 2015-07-25: qty 1

## 2015-07-25 MED ORDER — ROCURONIUM BROMIDE 100 MG/10ML IV SOLN
INTRAVENOUS | Status: AC
  Filled 2015-07-25: qty 1

## 2015-07-25 MED ORDER — LACTATED RINGERS IV SOLN
INTRAVENOUS | Status: DC | PRN
  Administered 2015-07-25 (×2): via INTRAVENOUS

## 2015-07-25 MED ORDER — LACTATED RINGERS IV SOLN: INTRAVENOUS | Status: DC

## 2015-07-25 MED ORDER — FENTANYL CITRATE (PF) 100 MCG/2ML IJ SOLN
25.0000 ug | INTRAMUSCULAR | Status: DC | PRN
  Administered 2015-07-25: 25 ug via INTRAVENOUS
  Administered 2015-07-25: 50 ug via INTRAVENOUS
  Administered 2015-07-25: 25 ug via INTRAVENOUS

## 2015-07-25 MED ORDER — TEMAZEPAM 15 MG PO CAPS
30.0000 mg | ORAL_CAPSULE | Freq: Every day | ORAL | Status: DC
  Administered 2015-07-25: 30 mg via ORAL
  Filled 2015-07-25: qty 2

## 2015-07-25 MED ORDER — RISPERIDONE 0.25 MG PO TABS
0.2500 mg | ORAL_TABLET | Freq: Every day | ORAL | Status: DC
  Administered 2015-07-25: 0.25 mg via ORAL
  Filled 2015-07-25 (×3): qty 1

## 2015-07-25 MED ORDER — ROCURONIUM BROMIDE 100 MG/10ML IV SOLN
INTRAVENOUS | Status: DC | PRN
  Administered 2015-07-25: 30 mg via INTRAVENOUS
  Administered 2015-07-25: 10 mg via INTRAVENOUS

## 2015-07-25 MED ORDER — ONDANSETRON HCL 4 MG/2ML IJ SOLN
INTRAMUSCULAR | Status: AC
Start: 2015-07-25 — End: 2015-07-25
  Filled 2015-07-25: qty 2

## 2015-07-25 MED ORDER — OXYCODONE-ACETAMINOPHEN 7.5-325 MG PO TABS
1.0000 | ORAL_TABLET | ORAL | Status: DC | PRN
  Administered 2015-07-25 – 2015-07-26 (×2): 1 via ORAL
  Filled 2015-07-25 (×2): qty 1

## 2015-07-25 MED ORDER — DEXAMETHASONE SODIUM PHOSPHATE 10 MG/ML IJ SOLN
INTRAMUSCULAR | Status: AC
  Filled 2015-07-25: qty 1

## 2015-07-25 MED ORDER — LACTATED RINGERS IR SOLN
Status: DC | PRN
  Administered 2015-07-25: 1000 mL

## 2015-07-25 MED ORDER — FUROSEMIDE 20 MG PO TABS
20.0000 mg | ORAL_TABLET | Freq: Every day | ORAL | Status: DC
  Administered 2015-07-26: 20 mg via ORAL
  Filled 2015-07-25 (×2): qty 1

## 2015-07-25 MED ORDER — 0.9 % SODIUM CHLORIDE (POUR BTL) OPTIME
TOPICAL | Status: DC | PRN
  Administered 2015-07-25: 1000 mL

## 2015-07-25 MED ORDER — FENTANYL CITRATE (PF) 100 MCG/2ML IJ SOLN
12.5000 ug | INTRAMUSCULAR | Status: DC | PRN
  Administered 2015-07-25: 12.5 ug via INTRAVENOUS
  Filled 2015-07-25: qty 2

## 2015-07-25 MED ORDER — LIDOCAINE HCL (CARDIAC) 20 MG/ML IV SOLN
INTRAVENOUS | Status: AC
  Filled 2015-07-25: qty 5

## 2015-07-25 MED ORDER — SUCCINYLCHOLINE CHLORIDE 20 MG/ML IJ SOLN
INTRAMUSCULAR | Status: DC | PRN
  Administered 2015-07-25: 100 mg via INTRAVENOUS

## 2015-07-25 MED ORDER — NEOSTIGMINE METHYLSULFATE 10 MG/10ML IV SOLN
INTRAVENOUS | Status: DC | PRN
  Administered 2015-07-25: 3 mg via INTRAVENOUS

## 2015-07-25 MED ORDER — FENTANYL CITRATE (PF) 250 MCG/5ML IJ SOLN
INTRAMUSCULAR | Status: AC
Start: 2015-07-25 — End: 2015-07-25
  Filled 2015-07-25: qty 25

## 2015-07-25 MED ORDER — PROPOFOL 10 MG/ML IV BOLUS
INTRAVENOUS | Status: AC
  Filled 2015-07-25: qty 20

## 2015-07-25 MED ORDER — HEPARIN SODIUM (PORCINE) 5000 UNIT/ML IJ SOLN
5000.0000 [IU] | Freq: Three times a day (TID) | INTRAMUSCULAR | Status: DC
  Administered 2015-07-25 – 2015-07-26 (×2): 5000 [IU] via SUBCUTANEOUS
  Filled 2015-07-25 (×6): qty 1

## 2015-07-25 MED ORDER — MEPERIDINE HCL 50 MG/ML IJ SOLN: 6.2500 mg | INTRAMUSCULAR | Status: DC | PRN

## 2015-07-25 MED ORDER — HYDRALAZINE HCL 20 MG/ML IJ SOLN
INTRAMUSCULAR | Status: DC | PRN
  Administered 2015-07-25 (×2): 5 mg via INTRAVENOUS

## 2015-07-25 MED ORDER — METRONIDAZOLE IN NACL 5-0.79 MG/ML-% IV SOLN
INTRAVENOUS | Status: AC
  Filled 2015-07-25: qty 100

## 2015-07-25 MED ORDER — GLYCOPYRROLATE 0.2 MG/ML IJ SOLN
INTRAMUSCULAR | Status: DC | PRN
  Administered 2015-07-25 (×3): 0.1 mg via INTRAVENOUS

## 2015-07-25 MED ORDER — DONEPEZIL HCL 10 MG PO TABS
10.0000 mg | ORAL_TABLET | Freq: Every day | ORAL | Status: DC
  Administered 2015-07-25: 10 mg via ORAL
  Filled 2015-07-25 (×3): qty 1

## 2015-07-25 MED ORDER — DOCUSATE SODIUM 100 MG PO CAPS
100.0000 mg | ORAL_CAPSULE | Freq: Two times a day (BID) | ORAL | Status: DC
  Administered 2015-07-25 – 2015-07-26 (×2): 100 mg via ORAL

## 2015-07-25 MED ORDER — FENTANYL CITRATE (PF) 100 MCG/2ML IJ SOLN
INTRAMUSCULAR | Status: AC
  Filled 2015-07-25: qty 2

## 2015-07-25 MED ORDER — BUPIVACAINE HCL (PF) 0.5 % IJ SOLN
INTRAMUSCULAR | Status: AC
  Filled 2015-07-25: qty 30

## 2015-07-25 MED ORDER — BUPIVACAINE HCL (PF) 0.5 % IJ SOLN
INTRAMUSCULAR | Status: DC | PRN
  Administered 2015-07-25: 11 mL

## 2015-07-25 MED ORDER — DEXAMETHASONE SODIUM PHOSPHATE 10 MG/ML IJ SOLN
INTRAMUSCULAR | Status: DC | PRN
  Administered 2015-07-25: 10 mg via INTRAVENOUS

## 2015-07-25 MED ORDER — DIPHENHYDRAMINE HCL 50 MG/ML IJ SOLN
INTRAMUSCULAR | Status: AC
  Filled 2015-07-25: qty 1

## 2015-07-25 MED ORDER — FENTANYL CITRATE (PF) 100 MCG/2ML IJ SOLN
INTRAMUSCULAR | Status: AC
  Filled 2015-07-25: qty 4

## 2015-07-25 MED ORDER — PANTOPRAZOLE SODIUM 40 MG PO TBEC
80.0000 mg | DELAYED_RELEASE_TABLET | Freq: Every day | ORAL | Status: DC
  Filled 2015-07-25: qty 2

## 2015-07-25 MED ORDER — MORPHINE SULFATE ER 15 MG PO TBCR
15.0000 mg | EXTENDED_RELEASE_TABLET | Freq: Two times a day (BID) | ORAL | Status: DC
  Administered 2015-07-25 – 2015-07-26 (×2): 15 mg via ORAL
  Filled 2015-07-25 (×2): qty 1

## 2015-07-25 MED ORDER — IRBESARTAN 300 MG PO TABS
300.0000 mg | ORAL_TABLET | Freq: Every day | ORAL | Status: DC
  Administered 2015-07-26: 300 mg via ORAL
  Filled 2015-07-25 (×2): qty 1

## 2015-07-25 MED ORDER — FENTANYL CITRATE (PF) 100 MCG/2ML IJ SOLN
INTRAMUSCULAR | Status: DC | PRN
  Administered 2015-07-25 (×7): 50 ug via INTRAVENOUS

## 2015-07-25 MED ORDER — DIPHENHYDRAMINE HCL 50 MG/ML IJ SOLN
12.5000 mg | Freq: Once | INTRAMUSCULAR | Status: AC
  Administered 2015-07-25: 12.5 mg via INTRAVENOUS

## 2015-07-25 MED ORDER — PROPOFOL 10 MG/ML IV BOLUS
INTRAVENOUS | Status: DC | PRN
  Administered 2015-07-25: 120 mg via INTRAVENOUS

## 2015-07-25 MED ORDER — LORAZEPAM 2 MG/ML IJ SOLN
1.0000 mg | Freq: Once | INTRAMUSCULAR | Status: AC
  Administered 2015-07-25: 1 mg via INTRAVENOUS
  Filled 2015-07-25: qty 1

## 2015-07-25 SURGICAL SUPPLY — 49 items
APL SKNCLS STERI-STRIP NONHPOA (GAUZE/BANDAGES/DRESSINGS) ×1
APPLIER CLIP 5 13 M/L LIGAMAX5 (MISCELLANEOUS)
APPLIER CLIP ROT 10 11.4 M/L (STAPLE)
APR CLP MED LRG 11.4X10 (STAPLE)
APR CLP MED LRG 5 ANG JAW (MISCELLANEOUS)
BAG SPEC RTRVL LRG 6X4 10 (ENDOMECHANICALS) ×1
BENZOIN TINCTURE PRP APPL 2/3 (GAUZE/BANDAGES/DRESSINGS) ×3 IMPLANT
CHLORAPREP W/TINT 26ML (MISCELLANEOUS) ×3 IMPLANT
CLIP APPLIE 5 13 M/L LIGAMAX5 (MISCELLANEOUS) IMPLANT
CLIP APPLIE ROT 10 11.4 M/L (STAPLE) IMPLANT
CLOSURE WOUND 1/2 X4 (GAUZE/BANDAGES/DRESSINGS) ×1
COVER SURGICAL LIGHT HANDLE (MISCELLANEOUS) ×3 IMPLANT
CUTTER FLEX LINEAR 45M (STAPLE) ×2 IMPLANT
DECANTER SPIKE VIAL GLASS SM (MISCELLANEOUS) ×3 IMPLANT
DRAIN CHANNEL 19F RND (DRAIN) IMPLANT
DRAPE LAPAROSCOPIC ABDOMINAL (DRAPES) ×3 IMPLANT
DRSG TEGADERM 2-3/8X2-3/4 SM (GAUZE/BANDAGES/DRESSINGS) ×6 IMPLANT
ELECT REM PT RETURN 9FT ADLT (ELECTROSURGICAL) ×3
ELECTRODE REM PT RTRN 9FT ADLT (ELECTROSURGICAL) ×1 IMPLANT
ENDOLOOP SUT PDS II  0 18 (SUTURE)
ENDOLOOP SUT PDS II 0 18 (SUTURE) IMPLANT
EVACUATOR SILICONE 100CC (DRAIN) IMPLANT
GAUZE SPONGE 2X2 8PLY STRL LF (GAUZE/BANDAGES/DRESSINGS) ×1 IMPLANT
GLOVE ECLIPSE 8.0 STRL XLNG CF (GLOVE) ×3 IMPLANT
GLOVE INDICATOR 8.0 STRL GRN (GLOVE) ×3 IMPLANT
GOWN STRL REUS W/TWL XL LVL3 (GOWN DISPOSABLE) ×6 IMPLANT
KIT BASIN OR (CUSTOM PROCEDURE TRAY) ×3 IMPLANT
POUCH SPECIMEN RETRIEVAL 10MM (ENDOMECHANICALS) ×3 IMPLANT
RELOAD 45 VASCULAR/THIN (ENDOMECHANICALS) IMPLANT
RELOAD STAPLE 45 2.5 WHT GRN (ENDOMECHANICALS) IMPLANT
RELOAD STAPLE 45 3.5 BLU ETS (ENDOMECHANICALS) IMPLANT
RELOAD STAPLE TA45 3.5 REG BLU (ENDOMECHANICALS) ×3 IMPLANT
SCISSORS LAP 5X35 DISP (ENDOMECHANICALS) IMPLANT
SET IRRIG TUBING LAPAROSCOPIC (IRRIGATION / IRRIGATOR) ×3 IMPLANT
SHEARS HARMONIC ACE PLUS 36CM (ENDOMECHANICALS) ×3 IMPLANT
SLEEVE XCEL OPT CAN 5 100 (ENDOMECHANICALS) ×3 IMPLANT
SOLUTION ANTI FOG 6CC (MISCELLANEOUS) ×3 IMPLANT
SPONGE GAUZE 2X2 STER 10/PKG (GAUZE/BANDAGES/DRESSINGS) ×2
STRIP CLOSURE SKIN 1/2X4 (GAUZE/BANDAGES/DRESSINGS) ×2 IMPLANT
SUT ETHILON 3 0 PS 1 (SUTURE) IMPLANT
SUT MNCRL AB 4-0 PS2 18 (SUTURE) ×3 IMPLANT
TOWEL OR 17X26 10 PK STRL BLUE (TOWEL DISPOSABLE) ×3 IMPLANT
TOWEL OR NON WOVEN STRL DISP B (DISPOSABLE) ×3 IMPLANT
TRAY FOLEY W/METER SILVER 14FR (SET/KITS/TRAYS/PACK) ×3 IMPLANT
TRAY FOLEY W/METER SILVER 16FR (SET/KITS/TRAYS/PACK) ×1 IMPLANT
TRAY LAPAROSCOPIC (CUSTOM PROCEDURE TRAY) ×3 IMPLANT
TROCAR BLADELESS OPT 5 100 (ENDOMECHANICALS) ×3 IMPLANT
TROCAR XCEL BLUNT TIP 100MML (ENDOMECHANICALS) ×3 IMPLANT
TUBING INSUFFLATION 10FT LAP (TUBING) ×3 IMPLANT

## 2015-07-25 NOTE — Discharge Instructions (Addendum)
LAPAROSCOPIC SURGERY: POST OP INSTRUCTIONS ° °1. DIET: Follow a light bland diet the first 24 hours after arrival home, such as soup, liquids, crackers, etc.  Be sure to include lots of fluids daily.  Avoid fast food or heavy meals as your are more likely to get nauseated.  Eat a low fat the next few days after surgery.   °2. Take your usually prescribed home medications unless otherwise directed. °3. PAIN CONTROL: °a. Pain is best controlled by a usual combination of three different methods TOGETHER: °i. Ice/Heat °ii. Over the counter pain medication °iii. Prescription pain medication °b. Most patients will experience some swelling and bruising around the incisions.  Ice packs or heating pads (30-60 minutes up to 6 times a day) will help. Use ice for the first few days to help decrease swelling and bruising, then switch to heat to help relax tight/sore spots and speed recovery.  Some people prefer to use ice alone, heat alone, alternating between ice & heat.  Experiment to what works for you.  Swelling and bruising can take several weeks to resolve.   °c. It is helpful to take an over-the-counter pain medication regularly for the first few weeks.  Choose one of the following that works best for you: °i. Naproxen (Aleve, etc)  Two 220mg tabs twice a day °ii. Ibuprofen (Advil, etc) Three 200mg tabs four times a day (every meal & bedtime) °iii. Acetaminophen (Tylenol, etc) 500-650mg four times a day (every meal & bedtime) °d. A  prescription for pain medication (such as oxycodone, hydrocodone, etc) should be given to you upon discharge.  Take your pain medication as prescribed.  °i. If you are having problems/concerns with the prescription medicine (does not control pain, nausea, vomiting, rash, itching, etc), please call us [phone] to see if we need to switch you to a different pain medicine that will work better for you and/or control your side effect better. °ii. If you need a refill on your pain medication,  please contact your pharmacy.  They will contact our office to request authorization. Prescriptions will not be filled after 5 pm or on week-ends. °4. Avoid getting constipated.  Between the surgery and the pain medications, it is common to experience some constipation.  Increasing fluid intake and taking a fiber supplement (such as Metamucil, Citrucel, FiberCon, MiraLax, etc) 1-2 times a day regularly will usually help prevent this problem from occurring.  A mild laxative (prune juice, Milk of Magnesia, MiraLax, etc) should be taken according to package directions if there are no bowel movements after 48 hours.   °5. Watch out for diarrhea.  If you have many loose bowel movements, simplify your diet to bland foods & liquids for a few days.  Stop any stool softeners and decrease your fiber supplement.  Switching to mild anti-diarrheal medications (Kayopectate, Pepto Bismol) can help.  If this worsens or does not improve, please call us. °6. Wash / shower every day.  You may shower over the dressings as they are waterproof.  Continue to shower over incision(s) after the dressing is off. °7. Remove your waterproof bandages 5 days after surgery.  You may leave the incision open to air.  You may replace a dressing/Band-Aid to cover the incision for comfort if you wish.  °8. ACTIVITIES as tolerated:   °a. You may resume regular (light) daily activities beginning the next day--such as daily self-care, walking, climbing stairs--gradually increasing activities as tolerated.  If you can walk 30 minutes without difficulty, it   is safe to try more intense activity such as jogging, treadmill, bicycling, low-impact aerobics, swimming, etc. b. Save the most intensive and strenuous activity for last such as sit-ups, heavy lifting, contact sports, etc  Refrain from any heavy lifting or straining until you are off narcotics for pain control.   c. DO NOT PUSH THROUGH PAIN.  Let pain be your guide: If it hurts to do something, don't  do it.  Pain is your body warning you to avoid that activity for another week until the pain goes down. d. You may drive when you are no longer taking prescription pain medication, you can comfortably wear a seatbelt, and you can safely maneuver your car and apply brakes. e. Dennis Bast may have sexual intercourse when it is comfortable.  9. FOLLOW UP in our office a. Please call [facility] at [phone] to set up an appointment to see your surgeon in the office for a follow-up appointment approximately 2-3 weeks after your surgery. b. Make sure that you call for this appointment the day you arrive home to insure a convenient appointment time. 10. IF YOU HAVE DISABILITY OR FAMILY LEAVE FORMS, BRING THEM TO THE OFFICE FOR PROCESSING.  DO NOT GIVE THEM TO YOUR DOCTOR.   WHEN TO CALL us [phone]: 1. Poor pain control 2. Reactions / problems with new medications (rash/itching, nausea, etc)  3. Fever over 101.5 F (38.5 C) 4. Inability to urinate 5. Nausea and/or vomiting 6. Worsening swelling or bruising 7. Continued bleeding from incision. 8. Increased pain, redness, or drainage from the incision   The clinic staff is available to answer your questions during regular business hours (8:30am-5pm).  Please dont hesitate to call and ask to speak to one of our nurses for clinical concerns.   If you have a medical emergency, go to the nearest emergency room or call 911.  A surgeon from [facility] is always on call at the [facility], [place], [place], [place], [place]  [address] ? MAIN: [phone] ? TOLL FREE: [phone] ?  FAX [phone] [email or web address]   Appendicitis Appendicitis is when the appendix is swollen (inflamed). The inflammation can lead to developing a hole (perforation) and a collection of pus (abscess). CAUSES  There is not always an obvious cause of appendicitis. Sometimes it is caused by an  obstruction in the appendix. The obstruction can be caused by:  A small, hard, pea-sized ball of stool (fecalith).  Enlarged lymph glands in the appendix. SYMPTOMS   Pain around your belly button (navel) that moves toward your lower right belly (abdomen). The pain can become more severe and sharp as time passes.  Tenderness in the lower right abdomen. Pain gets worse if you cough or make a sudden movement.  Feeling sick to your stomach (nauseous).  Throwing up (vomiting).  Loss of appetite.  Fever.  Constipation.  Diarrhea.  Generally not feeling well. DIAGNOSIS   Physical exam.  Blood tests.  Urine test.  X-rays or a CT scan may confirm the diagnosis. TREATMENT  Once the diagnosis of appendicitis is made, the most common treatment is to remove the appendix as soon as possible. This procedure is called appendectomy. In an open appendectomy, a cut (incision) is made in the lower right abdomen and the appendix is removed. In a laparoscopic appendectomy, usually 3 small incisions are made. Long, thin instruments and a camera tube are used to remove the appendix. Most patients go home  in 24 to 48 hours after appendectomy. In some situations, the appendix may have already perforated and an abscess may have formed. The abscess may have a "wall" around it as seen on a CT scan. In this case, a drain may be placed into the abscess to remove fluid, and you may be treated with antibiotic medicines that kill germs. The medicine is given through a tube in your vein (IV). Once the abscess has resolved, it may or may not be necessary to have an appendectomy. You may need to stay in the hospital longer than 48 hours. Document Released: 11/15/2005 Document Revised: 05/16/2012 Document Reviewed: 02/10/2010 [facility] Patient Information 2015 [place], [place]. This information is not intended to replace advice given to you by your health care provider. Make sure you discuss any questions you have with  your health care provider.  GETTING TO GOOD BOWEL HEALTH. Irregular bowel habits such as constipation and diarrhea can lead to many problems over time.  Having one soft bowel movement a day is the most important way to prevent further problems.  The anorectal canal is designed to handle stretching and feces to safely manage our ability to get rid of solid waste (feces, poop, stool) out of our body.  BUT, hard constipated stools can act like ripping concrete bricks and diarrhea can be a burning fire to this very sensitive area of our body, causing inflamed hemorrhoids, anal fissures, increasing risk is perirectal abscesses, abdominal pain/bloating, an making irritable bowel worse.      The goal: ONE SOFT BOWEL MOVEMENT A DAY!  To have soft, regular bowel movements:   Drink plenty of fluids, consider 4-6 tall glasses of water a day.    Take plenty of fiber.  Fiber is the undigested part of plant food that passes into the colon, acting s natures broom to encourage bowel motility and movement.  Fiber can absorb and hold large amounts of water. This results in a larger, bulkier stool, which is soft and easier to pass. Work gradually over several weeks up to 6 servings a day of fiber (25g a day even more if needed) in the form of: o Vegetables -- Root (potatoes, carrots, turnips), leafy green (lettuce, salad greens, celery, spinach), or cooked high residue (cabbage, broccoli, etc) o Fruit -- Fresh (unpeeled skin & pulp), Dried (prunes, apricots, cherries, etc ),  or stewed ( applesauce)  o Whole grain breads, pasta, etc (whole wheat)  o Bran cereals   Bulking Agents -- This type of water-retaining fiber generally is easily obtained each day by one of the following:  o Psyllium bran -- The psyllium plant is remarkable because its ground seeds can retain so much water. This product is available as Metamucil, Konsyl, Effersyllium, Per Diem Fiber, or the less expensive generic preparation in drug and health  food stores. Although labeled a laxative, it really is not a laxative.  o Methylcellulose -- This is another fiber derived from wood which also retains water. It is available as Citrucel. o Polyethylene Glycol - and artificial fiber commonly called Miralax or Glycolax.  It is helpful for people with gassy or bloated feelings with regular fiber o Flax Seed - a less gassy fiber than psyllium  No reading or other relaxing activity while on the toilet. If bowel movements take longer than 5 minutes, you are too constipated  AVOID CONSTIPATION.  High fiber and water intake usually takes care of this.  Sometimes a laxative is needed to stimulate more frequent bowel movements,  but   Laxatives are not a good long-term solution as it can wear the colon out.  They can help jump-start bowels if constipated, but should be relied on constantly without discussing with your doctor o Osmotics (Milk of Magnesia, Fleets phosphosoda, Magnesium citrate, MiraLax, GoLytely) are safer than  o Stimulants (Senokot, Castor Oil, Dulcolax, Ex Lax)    o Avoid taking laxatives for more than 7 days in a row.   IF SEVERELY CONSTIPATED, try a Bowel Retraining Program: o Do not use laxatives.  o Eat a diet high in roughage, such as bran cereals and leafy vegetables.  o Drink six (6) ounces of prune or apricot juice each morning.  o Eat two (2) large servings of stewed fruit each day.  o Take one (1) heaping tablespoon of a psyllium-based bulking agent twice a day. Use sugar-free sweetener when possible to avoid excessive calories.  o Eat a normal breakfast.  o Set aside 15 minutes after breakfast to sit on the toilet, but do not strain to have a bowel movement.  o If you do not have a bowel movement by the third day, use an enema and repeat the above steps.   Controlling diarrhea o Switch to liquids and simpler foods for a few days to avoid stressing your intestines further. o Avoid dairy products (especially milk & ice  cream) for a short time.  The intestines often can lose the ability to digest lactose when stressed. o Avoid foods that cause gassiness or bloating.  Typical foods include beans and other legumes, cabbage, broccoli, and dairy foods.  Every person has some sensitivity to other foods, so listen to our body and avoid those foods that trigger problems for you. o Adding fiber (Citrucel, Metamucil, psyllium, Miralax) gradually can help thicken stools by absorbing excess fluid and retrain the intestines to act more normally.  Slowly increase the dose over a few weeks.  Too much fiber too soon can backfire and cause cramping & bloating. o Probiotics (such as active yogurt, Align, etc) may help repopulate the intestines and colon with normal bacteria and calm down a sensitive digestive tract.  Most studies show it to be of mild help, though, and such products can be costly. o Medicines: - Bismuth subsalicylate (ex. Kayopectate, Pepto Bismol) every 30 minutes for up to 6 doses can help control diarrhea.  Avoid if pregnant. - Loperamide (Immodium) can slow down diarrhea.  Start with two tablets ('4mg'$  total) first and then try one tablet every 6 hours.  Avoid if you are having fevers or severe pain.  If you are not better or start feeling worse, stop all medicines and call your doctor for advice o Call your doctor if you are getting worse or not better.  Sometimes further testing (cultures, endoscopy, X-ray studies, bloodwork, etc) may be needed to help diagnose and treat the cause of the diarrhea.  TROUBLESHOOTING IRREGULAR BOWELS 1) Avoid extremes of bowel movements (no bad constipation/diarrhea) 2) Miralax 17gm mixed in 8oz. water or juice-daily. May use BID as needed.  3) Gas-x,Phazyme, etc. as needed for gas & bloating.  4) Soft,bland diet. No spicy,greasy,fried foods.  5) Prilosec over-the-counter as needed  6) May hold gluten/wheat products from diet to see if symptoms improve.  7)  May try probiotics  (Align, Activa, etc) to help calm the bowels down 7) If symptoms become worse call back immediately.  Managing Pain  Pain after surgery or related to activity is often due to strain/injury to muscle,  tendon, nerves and/or incisions.  This pain is usually short-term and will improve in a few months.   Many people find it helpful to do the following things TOGETHER to help speed the process of healing and to get back to regular activity more quickly:  1. Avoid heavy physical activity at first a. No lifting greater than 20 pounds at first, then increase to lifting as tolerated over the next few weeks b. Do not push through the pain.  Listen to your body and avoid positions and maneuvers than reproduce the pain.  Wait a few days before trying something more intense c. Walking is okay as tolerated, but go slowly and stop when getting sore.  If you can walk 30 minutes without stopping or pain, you can try more intense activity (running, jogging, aerobics, cycling, swimming, treadmill, sex, sports, weightlifting, etc ) d. Remember: If it hurts to do it, then dont do it!  2. Take Anti-inflammatory medication i. Choose Acetaminophen '500mg'$  tabs (Tylenol) 1-2 pills with every meal and just before bedtime (avoid if you have liver problems) a. Take with food/snack around the clock for 1-2 weeks i. This helps the muscle and nerve tissues become less irritable and calm down faster  3. Use a Heating pad or Ice/Cold Pack a. 4-6 times a day b. May use warm bath/hottub  or showers  4. Try Gentle Massage and/or Stretching  a. at the area of pain many times a day b. stop if you feel pain - do not overdo it  Try these steps together to help you body heal faster and avoid making things get worse.  Doing just one of these things may not be enough.    If you are not getting better after two weeks or are noticing you are getting worse, contact our office for further advice; we may need to re-evaluate you &  see what other things we can do to help.  Alzheimer Disease Caregiver Guide Alzheimer disease is an illness that affects a person's brain. It causes a person to lose the ability to remember things and make good decisions. As the disease progresses, the person is unable to take care of himself or herself and needs more and more help to do simple tasks. Taking care of someone with Alzheimer disease can be very challenging and overwhelming.  MEMORY LOSS AND CONFUSION Memory loss and confusion is mild in the beginning stages of the disease. Both of these problems become more severe as the disease progresses. Eventually, the person will not recognize places or even close family members and friends.   Stay calm.  Respond with a short explanation. Long explanations can be overwhelming and confusing.  Avoid corrections that sound like scolding.  Try not to take it personally, even if the person forgets your name. BEHAVIOR CHANGES Behavior changes are part of the disease. The person may develop depression, anxiety, anger, hallucinations, or other behavior changes. These changes can come on suddenly and may be in response to pain, infection, changes in the environment (temperature, noise), overstimulation, or feeling lost or scared.   Try not to take behavior changes personally.  Remain calm and patient.  Do not argue or try to convince the person about a specific point. This will only make him or her more agitated.  Know that the behavior changes are part of the disease process and try to work through it. TIPS TO REDUCE FRUSTRATION  Schedule wisely by making appointments and doing daily tasks, like bathing and dressing,  when the person is at his or her best.  Take your time. Simple tasks may take a lot longer, so be sure to allow for plenty of time.  Limit choices. Too many choices can be overwhelming and stressful for the person.  Involve the person in what you are doing.  Stick to a  routine.  Avoid new or crowded situations, if possible.  Use simple words, short sentences, and a calm voice. Only give one direction at a time.  Buy clothes and shoes that are easy to put on and take off.  Let people help if they offer. HOME SAFETY Keeping the home safe is very important to reduce the risk of falls and injuries.   Keep floors clear of clutter. Remove rugs, magazine racks, and floor lamps.  Keep hallways well lit.  Put a handrail and nonslip mat in the bathtub or shower.  Put childproof locks on cabinets with dangerous items, such as medicine, alcohol, guns, toxic cleaning items, sharp tools or utensils, matches, or lighters.  Place locks on doors where the person cannot easily see or reach them. This helps ensure that the person cannot wander out of the house and get lost.  Be prepared for emergencies. Keep a list of emergency phone numbers and addresses in a convenient area. PLANS FOR THE FUTURE  Do not put off talking about finances.  Talk about money management. People with Alzheimer disease have trouble managing their money as the disease gets worse.  Get help from professional advisors regarding financial and legal matters.  Do not put off talking about future care.  Choose a power of attorney. This is someone who can make decisions for the person with Alzheimer disease when he or she is no longer able to do so.  Talk about driving and when it is the right time to stop. The person's health care provider can help give advice on this matter.  Talk about the person's living situation. If he or she lives alone, you need to make sure he or she is safe. Some people need extra help at home, and others need more care at a nursing home or care center. SUPPORT GROUPS Joining a support group can be very helpful for caregivers of people with Alzheimer disease. Some advantages to being part of a support group include:   Getting strategies to manage  stress.  Sharing experiences with others.  Receiving emotional comfort and support.  Learning new caregiving skills as the disease progresses.  Knowing what community resources are available and taking advantage of them. SEEK MEDICAL CARE IF:  The person has a fever.  The person has a sudden change in behavior that does not improve with calming strategies.  The person is unable to manage in his or her current living situation.  The person threatens you or anyone else, including himself or herself.  You are no longer able to care for the person. Document Released: 07/27/2004 Document Revised: 04/01/2014 Document Reviewed: 12/22/2011 [facility] Patient Information 2015 [place], [place]. This information is not intended to replace advice given to you by your health care provider. Make sure you discuss any questions you have with your health care provider.

## 2015-07-25 NOTE — Progress Notes (Signed)
  Oncology Nurse Navigator Documentation    Navigator Encounter Type: Other (Inpatient) (07/25/15 1100) Patient Visit Type: Inpatient (07/25/15 1100)   Barriers/Navigation Needs: Family concerns (07/25/15 1100)   Interventions: Other (Obtained Advanced Directive info as requested) (07/25/15 1100)    [name].  I visited with patient prior to going to surgery.  Patient alert, pleasant and confused.  Patient unable to carry on a conversation and identified her daughter as her mother.  Patient's three daughters at her bedside.  Daughter, Alexa [name], reports that patient's dementia has increased significantly and that she and her sister live with her mother to care for her.  Alexa [name] asked for information regarding advanced directive and healthcare power of attorney.  I obtained information and reviewed it with Alexa [name].  I encouraged her to call me for any other questions or needs.  Alexa [name] said that she has my card and contact information.       Time Spent with Patient: 15 (07/25/15 1100)

## 2015-07-25 NOTE — Progress Notes (Signed)
Patient ID: Alexa [name], female   DOB: [birth year], 79 y.o.   MRN: [id number]     [facility]      [address] [place], [place] [address]    Phone: [phone] FAX: [phone]     Subjective: Pleasantly confused.  RLQ abd pain.  Voiding.  Denies sob, cp.   Objective:  Vital signs:  Filed Vitals:   07/24/15 2014 07/24/15 2159 07/25/15 0117 07/25/15 0513  BP: 162/78 172/68 150/84 173/79  Pulse: 68 70 69 62  Temp: 98.2 F (36.8 C) 98.2 F (36.8 C) 97.7 F (36.5 C) 97.6 F (36.4 C)  TempSrc: Oral Oral Oral Oral  Resp: $Remo'16 18 18 18  'IlkwS$ SpO2: 96% 98% 100% 100%       Intake/Output   Yesterday:  08/25 0701 - 08/26 0700 In: -  Out: 1350 [Urine:1350] This shift: I/O last 3 completed shifts: In: -  Out: 1350 [Urine:1350]     Physical Exam: General: Pt awake/alert/oriented Chest: cta.  No chest wall pain w good excursion CV:  Pulses intact.  Regular rhythm MS: Normal AROM mjr joints.  No obvious deformity Abdomen: Soft.  Nondistended. Mild ttp to RLQ.  No evidence of peritonitis.  No incarcerated hernias. Ext:  SCDs BLE.  No mjr edema.  No cyanosis Skin: No petechiae / purpura   Problem List:   Principal Problem:   Appendicitis, acute Active Problems:   Acute appendicitis    Results:   Labs: Results for orders placed or performed during the hospital encounter of 07/24/15 (from the past 48 hour(s))  Lipase, blood     Status: None   Collection Time: 07/24/15  3:59 PM  Result Value Ref Range   Lipase 43 22 - 51 U/L  Comprehensive metabolic panel     Status: Abnormal   Collection Time: 07/24/15  3:59 PM  Result Value Ref Range   Sodium 140 135 - 145 mmol/L   Potassium 3.5 3.5 - 5.1 mmol/L   Chloride 102 101 - 111 mmol/L   CO2 29 22 - 32 mmol/L   Glucose, Bld 111 (H) 65 - 99 mg/dL   BUN 17 6 - 20 mg/dL   Creatinine, Ser 1.35 (H) 0.44 - 1.00 mg/dL   Calcium 8.8 (L) 8.9 - 10.3 mg/dL   Total Protein 7.5 6.5  - 8.1 g/dL   Albumin 3.8 3.5 - 5.0 g/dL   AST 27 15 - 41 U/L   ALT 34 14 - 54 U/L   Alkaline Phosphatase 197 (H) 38 - 126 U/L   Total Bilirubin 0.8 0.3 - 1.2 mg/dL   GFR calc non Af Amer 37 (L) >60 mL/min   GFR calc Af Amer 43 (L) >60 mL/min    Comment: (NOTE) The eGFR has been calculated using the CKD EPI equation. This calculation has not been validated in all clinical situations. eGFR's persistently <60 mL/min signify possible Chronic Kidney Disease.    Anion gap 9 5 - 15  CBC     Status: None   Collection Time: 07/24/15  3:59 PM  Result Value Ref Range   WBC 5.8 4.0 - 10.5 K/uL   RBC 4.51 3.87 - 5.11 MIL/uL   Hemoglobin 12.9 12.0 - 15.0 g/dL   HCT 39.6 36.0 - 46.0 %   MCV 87.8 78.0 - 100.0 fL   MCH 28.6 26.0 - 34.0 pg   MCHC 32.6 30.0 - 36.0 g/dL   RDW 13.8 11.5 - 15.5 %  Platelets 310 150 - 400 K/uL  Urinalysis, Routine w reflex microscopic (not at [facility])     Status: Abnormal   Collection Time: 07/25/15 12:44 AM  Result Value Ref Range   Color, Urine YELLOW YELLOW   APPearance CLEAR CLEAR   Specific Gravity, Urine 1.016 1.005 - 1.030   pH 7.0 5.0 - 8.0   Glucose, UA NEGATIVE NEGATIVE mg/dL   Hgb urine dipstick NEGATIVE NEGATIVE   Bilirubin Urine NEGATIVE NEGATIVE   Ketones, ur NEGATIVE NEGATIVE mg/dL   Protein, ur NEGATIVE NEGATIVE mg/dL   Urobilinogen, UA 0.2 0.0 - 1.0 mg/dL   Nitrite NEGATIVE NEGATIVE   Leukocytes, UA SMALL (A) NEGATIVE  Urine microscopic-add on     Status: Abnormal   Collection Time: 07/25/15 12:44 AM  Result Value Ref Range   Squamous Epithelial / LPF FEW (A) RARE   WBC, UA 3-6 <3 WBC/hpf   Bacteria, UA RARE RARE  Surgical pcr screen     Status: None   Collection Time: 07/25/15  1:20 AM  Result Value Ref Range   MRSA, PCR NEGATIVE NEGATIVE   Staphylococcus aureus NEGATIVE NEGATIVE    Comment:        The Xpert SA Assay (FDA approved for NASAL specimens in patients over 52 years of age), is one component of a comprehensive  surveillance program.  Test performance has been validated by [facility] for patients greater than or equal to 50 year old. It is not intended to diagnose infection nor to guide or monitor treatment.     Imaging / Studies: No results found.  Medications / Allergies:  Scheduled Meds: . ciprofloxacin  400 mg Intravenous Q12H   And  . metronidazole  500 mg Intravenous Q8H   Continuous Infusions: . dextrose 5 % and 0.45 % NaCl with KCl 20 mEq/L 75 mL/hr at 07/24/15 1951   PRN Meds:.acetaminophen **OR** acetaminophen, HYDROcodone-acetaminophen, ondansetron **OR** ondansetron (ZOFRAN) IV  Antibiotics: Anti-infectives    Start     Dose/Rate Route Frequency Ordered Stop   07/24/15 1845  ciprofloxacin (CIPRO) IVPB 400 mg     400 mg 200 mL/hr over 60 Minutes Intravenous Every 12 hours 07/24/15 1835     07/24/15 1845  metroNIDAZOLE (FLAGYL) IVPB 500 mg     500 mg 100 mL/hr over 60 Minutes Intravenous Every 8 hours 07/24/15 1835          Assessment/Plan Probable acute appendicitis-to OR for laparoscopic appendectomy.  Obtain consent. -pain control, resume home meds post op if tolerating POs HTN-home meds Dementia-resume home meds hypothyroidism ID-cipro/flagyl dCHF-euvolemic, gentle IV hydration VTE prophylaxis-SCD, add heparin tonight  FEN-IVF Dispo-to OR  Erby Pian, ANP-BC [facility] Pager [phone]([id number]:[phone])   07/25/2015 7:29 AM

## 2015-07-25 NOTE — Anesthesia Postprocedure Evaluation (Signed)
  Anesthesia Post-op Note  Patient: Alexa [name]  Procedure(s) Performed: Procedure(s): APPENDECTOMY LAPAROSCOPIC (N/A)  Patient Location: PACU  Anesthesia Type:General  Level of Consciousness: awake and alert   Airway and Oxygen Therapy: Patient connected to nasal cannula oxygen  Post-op Pain: mild  Post-op Assessment: Post-op Vital signs reviewed and Patient's Cardiovascular Status Stable              Post-op Vital Signs: Reviewed and stable  Last Vitals:  Filed Vitals:   07/25/15 1400  BP: 149/56  Pulse: 102  Temp:   Resp: 19    Complications: No apparent anesthesia complications

## 2015-07-25 NOTE — Progress Notes (Signed)
Utilization review completed.  

## 2015-07-25 NOTE — Transfer of Care (Signed)
Immediate Anesthesia Transfer of Care Note  Patient: Alexa [name]  Procedure(s) Performed: Procedure(s): APPENDECTOMY LAPAROSCOPIC (N/A)  Patient Location: PACU  Anesthesia Type:General  Level of Consciousness: pateint uncooperative, confused and responds to stimulation  Airway & Oxygen Therapy: Patient Spontanous Breathing and Patient connected to face mask oxygen  Post-op Assessment: Report given to RN, Post -op Vital signs reviewed and stable and Patient moving all extremities  Post vital signs: Reviewed and stable  Last Vitals:  Filed Vitals:   07/25/15 0952  BP: 157/91  Pulse: 81  Temp: 36.4 C  Resp: 18    Complications: No apparent anesthesia complications

## 2015-07-25 NOTE — Progress Notes (Signed)
PACU note; Dr. Smith Robert in to see pt; palms of hands red and face is splotchy and red; med ordered and given; pt is crying and unable to console; oriented to person only; pain meds given and attempting to reassure pt; comfort measures and warm blankets

## 2015-07-25 NOTE — Anesthesia Preprocedure Evaluation (Addendum)
Anesthesia Evaluation  Patient identified by MRN, date of birth, ID band Patient awake    Reviewed: Allergy & Precautions, NPO status , Patient's Chart, lab work & pertinent test results  Airway Mallampati: II  TM Distance: >3 FB Neck ROM: Full    Dental  (+) Partial Upper   Pulmonary former smoker,  breath sounds clear to auscultation        Cardiovascular hypertension, Pt. on medications + Peripheral Vascular Disease and +CHF Rhythm:Regular Rate:Normal     Neuro/Psych PSYCHIATRIC DISORDERS    GI/Hepatic GERD-  Medicated,  Endo/Other  Hypothyroidism   Renal/GU Renal disease     Musculoskeletal  (+) Arthritis -,   Abdominal   Peds negative pediatric ROS (+)  Hematology   Anesthesia Other Findings   Reproductive/Obstetrics negative OB ROS                            Lab Results  Component Value Date   WBC 5.8 07/24/2015   HGB 12.9 07/24/2015   HCT 39.6 07/24/2015   MCV 87.8 07/24/2015   PLT 310 07/24/2015   Lab Results  Component Value Date   CREATININE 1.35* 07/24/2015   BUN 17 07/24/2015   NA 140 07/24/2015   K 3.5 07/24/2015   CL 102 07/24/2015   CO2 29 07/24/2015   Lab Results  Component Value Date   INR 0.91 05/10/2013   INR 3.7 01/09/2013   INR 3.5 12/11/2012   Echo (2015)  - Left ventricle: The cavity size was normal. Wall thickness was normal. Systolic function was normal. The estimated ejection fraction was in the range of 60% to 65%. Wall motion was normal; there were no regional wall motion abnormalities. Doppler parameters are consistent with abnormal left ventricular relaxation (grade 1 diastolic dysfunction). - Mitral valve: Calcified annulus. There was mild regurgitation. - Left atrium: The atrium was mildly dilated. - Tricuspid valve: There was moderate regurgitation. - Pulmonary arteries: Systolic pressure was severely increased. PA peak  pressure: 59 mm Hg (S).  PFT's: - Normal DLCO - Consistent with restrictive lung disease  Anesthesia Physical Anesthesia Plan  ASA: III  Anesthesia Plan: General   Post-op Pain Management:    Induction: Intravenous  Airway Management Planned: Oral ETT  Additional Equipment:   Intra-op Plan:   Post-operative Plan:   Informed Consent: I have reviewed the patients History and Physical, chart, labs and discussed the procedure including the risks, benefits and alternatives for the proposed anesthesia with the patient or authorized representative who has indicated his/her understanding and acceptance.   Dental advisory given  Plan Discussed with:   Anesthesia Plan Comments:         Anesthesia Quick Evaluation

## 2015-07-25 NOTE — Op Note (Signed)
Appendectomy, Lap, Procedure Note  Pre-operative Diagnosis: Acute appendicitis  Post-operative Diagnosis: Same  Procedure:  Laparoscopic appendectomy  Surgeon:  Jackolyn Confer, M.D.  Anesthesia:  General   Indications:  This is a 79 year old female who is complaining of some worsening abdominal pain. A CT scan was performed and was consistent with acute appendicitis. She was admitted last night and placed on IV antibiotics. She now brought to the operating room for appendectomy.    Surgeon: Odis Hollingshead   Assistants: None  Anesthesia: General endotracheal anesthesia   She was brought to the operating room, placed in the supine position and general anesthesia was induced, along with placement of orogastric tube, SCDs, and a Foley catheter. A timeout was performed. The abdomen was prepped and draped in a sterile fashion. A small infraumbilical incision was made at the site of a previous small subumbilical scar through the skin, subcutaneous tissue, fascia, and peritoneum entering the peritoneal cavity under direct vision.  A pursestring suture was passed around the fascia with a 0 Vicryl.  The Hasson was introduced into the peritoneal cavity and the tails of the suture were used to hold the Hasson in place.   The pneumoperitoneum was then established to steady pressure of 15 mmHg.   The laparoscope was introduced and there was no evidence of bleeding or underlying organ injury. Additional 5 mm cannulas then placed in the left lower quadrant of the abdomen and the right upper quadrant region under direct visualization. A careful evaluation of the entire abdomen was carried out. The patient was placed in Trendelenburg and left lateral decubitus position. The small intestines were retracted in the cephalad and left lateral direction away from the pelvis and right lower quadrant. The patient was found to have an enlarged and inflamed appendix that was extending into the pelvis. There was no  evidence of perforation.  The appendix was carefully mobilized. The mesoappendix was was divided with the harmonic scalpel.   The appendix was amputated off the cecum, with a small cuff of cecum, using an endo-GIA stapler.  The appendix was placed in a retrieval bag and removed through the subumbilical port incision. There was no evidence o, leakage after division of the appendix.  There was a small amount of bleeding at the staple line that was controlled with hemoclips.    Copious irrigation was  performed and irrigant fluid suctioned from the abdomen as much as possible.  The umbilical trocar was removed and the  port site fascia was closed via the purse string suture under laparoscopic vision. There was no residual palpable fascial defect.  The remaining trocars were removed and all  trocar site skin wounds were closed with 4-0 Monocryl.  Benzoin, Steri-Strips, and sterile dressings were applied.  She tolerated the procedure well without any apparent complications and was taken to the recovery room in satisfactory condition.  Instrument, sponge, and needle counts were correct at the conclusion of the case.   Findings: The appendix was found to be inflamed. There were not signs of necrosis.  There was not perforation. There was not abscess formation.  Estimated Blood Loss:  less than 100 mL                Specimens: Appendix         Disposition: PACU - hemodynamically stable.         Condition: stable

## 2015-07-25 NOTE — Anesthesia Procedure Notes (Signed)
Procedure Name: Intubation Date/Time: 07/25/2015 12:15 PM Performed by: Ofilia Neas Pre-anesthesia Checklist: Patient identified, Emergency Drugs available, Suction available, Patient being monitored and Timeout performed Patient Re-evaluated:Patient Re-evaluated prior to inductionOxygen Delivery Method: Circle system utilized Preoxygenation: Pre-oxygenation with 100% oxygen Intubation Type: IV induction and Cricoid Pressure applied Ventilation: Mask ventilation without difficulty Laryngoscope Size: Mac and 3 Grade View: Grade I Tube type: Oral Tube size: 7.5 mm Number of attempts: 1 Airway Equipment and Method: Stylet Placement Confirmation: ETT inserted through vocal cords under direct vision,  positive ETCO2 and breath sounds checked- equal and bilateral Secured at: 21 cm Tube secured with: Tape Dental Injury: Teeth and Oropharynx as per pre-operative assessment

## 2015-07-26 ENCOUNTER — Encounter (HOSPITAL_COMMUNITY): Payer: Self-pay | Admitting: Surgery

## 2015-07-26 MED ORDER — SODIUM CHLORIDE 0.9 % IV SOLN: 250.0000 mL | INTRAVENOUS | Status: DC | PRN

## 2015-07-26 MED ORDER — SODIUM CHLORIDE 0.9 % IJ SOLN: 3.0000 mL | Freq: Two times a day (BID) | INTRAMUSCULAR | Status: DC

## 2015-07-26 MED ORDER — SODIUM CHLORIDE 0.9 % IJ SOLN: 3.0000 mL | INTRAMUSCULAR | Status: DC | PRN

## 2015-07-26 MED ORDER — OXYCODONE-ACETAMINOPHEN 7.5-325 MG PO TABS: 1.0000 | ORAL_TABLET | ORAL | Status: DC | PRN

## 2015-07-26 NOTE — Progress Notes (Signed)
Nurse reviewed discharge instructions with pt's daughter.  Daughter verbalized understanding of discharge instructions, follow up appointments and pain medications.  No concerns at time of discharge.

## 2015-07-26 NOTE — Discharge Summary (Signed)
Physician Discharge Summary  Patient ID: Alexa [name] MRN: [id number] DOB/AGE: 1936/03/20 79 y.o.  Admit date: 07/24/2015 Discharge date: 07/26/2015  Patient Care Team: Laurey Morale, MD as PCP - General (Family Medicine) Newman Pies, MD (Neurosurgery) Annia Belt, MD as Consulting Physician (Hematology and Oncology) Larey Dresser, MD as Consulting Physician (Cardiology) Laurey Morale, MD (Family Medicine) Jackolyn Confer, MD as Consulting Physician (General Surgery)  Admission Diagnoses: Principal Problem:   Acute appendicitis s/p lap appy 07/25/2015 Active Problems:   Essential hypertension   Dementia with behavioral disturbance   Appendicitis, acute   Discharge Diagnoses:  Principal Problem:   Acute appendicitis s/p lap appy 07/25/2015 Active Problems:   Essential hypertension   Dementia with behavioral disturbance   Appendicitis, acute   POST-OPERATIVE DIAGNOSIS:  appendicitis  SURGERY:  Procedure(s): APPENDECTOMY LAPAROSCOPIC  SURGEON:  Surgeon(s): Jackolyn Confer, MD  Consults: None  Hospital Course:   The patient underwent the surgery above.  Postoperatively, the patient gradually mobilized and advanced to a solid diet.  Pain and other symptoms were treated aggressively.    By the time of discharge, the patient was walking well the hallways, eating food, having flatus.  Pain was well-controlled on an oral medications.  Based on meeting discharge criteria and continuing to recover, I felt it was safe for the patient to be discharged from the hospital to further recover with close followup. Postoperative recommendations were discussed in detail with the aptient & her daughter (who is a caregiver).  They are written as well.   Significant Diagnostic Studies:  Results for orders placed or performed during the hospital encounter of 07/24/15 (from the past 72 hour(s))  Lipase, blood     Status: None   Collection Time: 07/24/15  3:59 PM  Result  Value Ref Range   Lipase 43 22 - 51 U/L  Comprehensive metabolic panel     Status: Abnormal   Collection Time: 07/24/15  3:59 PM  Result Value Ref Range   Sodium 140 135 - 145 mmol/L   Potassium 3.5 3.5 - 5.1 mmol/L   Chloride 102 101 - 111 mmol/L   CO2 29 22 - 32 mmol/L   Glucose, Bld 111 (H) 65 - 99 mg/dL   BUN 17 6 - 20 mg/dL   Creatinine, Ser 1.35 (H) 0.44 - 1.00 mg/dL   Calcium 8.8 (L) 8.9 - 10.3 mg/dL   Total Protein 7.5 6.5 - 8.1 g/dL   Albumin 3.8 3.5 - 5.0 g/dL   AST 27 15 - 41 U/L   ALT 34 14 - 54 U/L   Alkaline Phosphatase 197 (H) 38 - 126 U/L   Total Bilirubin 0.8 0.3 - 1.2 mg/dL   GFR calc non Af Amer 37 (L) >60 mL/min   GFR calc Af Amer 43 (L) >60 mL/min    Comment: (NOTE) The eGFR has been calculated using the CKD EPI equation. This calculation has not been validated in all clinical situations. eGFR's persistently <60 mL/min signify possible Chronic Kidney Disease.    Anion gap 9 5 - 15  CBC     Status: None   Collection Time: 07/24/15  3:59 PM  Result Value Ref Range   WBC 5.8 4.0 - 10.5 K/uL   RBC 4.51 3.87 - 5.11 MIL/uL   Hemoglobin 12.9 12.0 - 15.0 g/dL   HCT 39.6 36.0 - 46.0 %   MCV 87.8 78.0 - 100.0 fL   MCH 28.6 26.0 - 34.0 pg   MCHC 32.6 30.0 -  36.0 g/dL   RDW 13.8 11.5 - 15.5 %   Platelets 310 150 - 400 K/uL  Urinalysis, Routine w reflex microscopic (not at [facility])     Status: Abnormal   Collection Time: 07/25/15 12:44 AM  Result Value Ref Range   Color, Urine YELLOW YELLOW   APPearance CLEAR CLEAR   Specific Gravity, Urine 1.016 1.005 - 1.030   pH 7.0 5.0 - 8.0   Glucose, UA NEGATIVE NEGATIVE mg/dL   Hgb urine dipstick NEGATIVE NEGATIVE   Bilirubin Urine NEGATIVE NEGATIVE   Ketones, ur NEGATIVE NEGATIVE mg/dL   Protein, ur NEGATIVE NEGATIVE mg/dL   Urobilinogen, UA 0.2 0.0 - 1.0 mg/dL   Nitrite NEGATIVE NEGATIVE   Leukocytes, UA SMALL (A) NEGATIVE  Urine microscopic-add on     Status: Abnormal   Collection Time: 07/25/15 12:44 AM  Result  Value Ref Range   Squamous Epithelial / LPF FEW (A) RARE   WBC, UA 3-6 <3 WBC/hpf   Bacteria, UA RARE RARE  Surgical pcr screen     Status: None   Collection Time: 07/25/15  1:20 AM  Result Value Ref Range   MRSA, PCR NEGATIVE NEGATIVE   Staphylococcus aureus NEGATIVE NEGATIVE    Comment:        The Xpert SA Assay (FDA approved for NASAL specimens in patients over 74 years of age), is one component of a comprehensive surveillance program.  Test performance has been validated by [facility] for patients greater than or equal to 64 year old. It is not intended to diagnose infection nor to guide or monitor treatment.     Dg Chest 2 View  07/25/2015   CLINICAL DATA:  Asthma DN, preop  EXAM: CHEST  2 VIEW  COMPARISON:  03/20/2014  FINDINGS: Cardiomediastinal silhouette is stable. Surgical clips in left axilla again noted. No acute infiltrate or pleural effusion. No pulmonary edema. Stable postsurgical changes lower thoracic and lumbar spine. Metallic fixation plate cervical spine.  IMPRESSION: No active cardiopulmonary disease.  No significant change.   Electronically Signed   By: Lahoma Crocker M.D.   On: 07/25/2015 09:46   Ct Angio Abd/pel W/ And/or W/o  07/22/2015   ADDENDUM REPORT: 07/22/2015 16:30  ADDENDUM: These results were called by telephone at the time of interpretation on 07/22/2015 at 4:29 pm to Dr. Burney Gauze, who verbally acknowledged these results.   Electronically Signed   By: Markus Daft M.D.   On: 07/22/2015 16:30   07/22/2015   CLINICAL DATA:  79 year old with history of scattered mural thrombus along the distal descending thoracic aorta and proximal abdominal aorta. Diffuse generalized abdominal pain for long time. History of lung cancer.  EXAM: CT ANGIOGRAPHY ABDOMEN AND PELVIS  TECHNIQUE: Multidetector CT imaging of the abdomen and pelvis was performed using the standard protocol during bolus administration of intravenous contrast. Multiplanar reconstructed images  including MIPs were obtained and reviewed to evaluate the vascular anatomy.  CONTRAST:  100 mL Omnipaque 300  COMPARISON:  12/13/2014  FINDINGS: ARTERIAL FINDINGS:  Aorta: The irregular mural thrombus in the distal descending thoracic aorta appears stable. The distal descending thoracic aorta measures 2.4 cm and stable. Mild mural thrombus in the abdominal aorta is unchanged. Infrarenal abdominal aorta measures up to 2.3 cm and unchanged.  Celiac axis: Celiac trunk and main branches are patent. The left gastric artery appears to originate from the aorta and just proximal to the celiac trunk.  Superior mesenteric: SMA is patent with mild plaque and mild narrowing at  the origin.  Left renal: Left renal artery is patent without significant stenosis.  Right renal:         Right renal artery patent without stenosis.  Inferior mesenteric: Patent  Left iliac: Left iliac arteries are patent without significant plaque or stenosis.  Right iliac: Right iliac arteries are patent without significant stenosis.  Venous findings: Limited evaluation of venous structures on this arterial phase examination. Stable appearance of the IVC filter near the renal veins.  Review of the MIP images confirms the above findings.  NONVASCULAR FINDINGS:  Stable punctate nodule at the left lung base is unchanged since 07/02/2014 and likely benign. There are chronic changes at the lung bases without effusions. Chronic dilatation of the extrahepatic bile duct measuring up to 1.2 cm and likely secondary to the cholecystectomy. No acute abnormality involving the liver, pancreas or spleen. No acute abnormality in the adrenal glands or kidneys. Small cyst along the right kidney upper pole. No acute abnormality in left kidney. No significant free fluid or lymphadenopathy. Uterus has been removed. Normal appearance of the urinary bladder. The appendix is prominent measuring up to 1.1 cm and there is mild stranding around the appendix. Findings are  concerning for acute appendix inflammation. Residual adnexal or ovarian tissue. Pedicle screw and rod fixation from T10 through L4. Fusion at L5-S1 and evidence for hardware removal at L5-S1. Chronic low-density fluid collections along the laminectomy defects at L5.  IMPRESSION: The appendix is prominent with surrounding inflammatory changes. Findings are suggestive for an acute appendicitis.  Atherosclerotic disease in the abdominal aorta with areas of irregular mural thrombus. Findings have not significantly changed since 12/13/2014. No evidence for aneurysm or dissection.  Electronically Signed: By: Markus Daft M.D. On: 07/22/2015 16:12    Discharge Exam: Blood pressure 134/60, pulse 73, temperature 97.7 F (36.5 C), temperature source Oral, resp. rate 18, SpO2 98 %.  General: Pt awake/alert/oriented x2 in no major acute distress Eyes: PERRL, normal EOM. Sclera nonicteric Neuro: CN II-XII intact w/o focal sensory/motor deficits. Lymph: No head/neck/groin lymphadenopathy Psych:  No delerium/psychosis/paranoia.  Mild stable dementia HENT: Normocephalic, Mucus membranes moist.  No thrush Neck: Supple, No tracheal deviation Chest: No pain.  Good respiratory excursion. CV:  Pulses intact.  Regular rhythm MS: Normal AROM mjr joints.  No obvious deformity Abdomen: Soft, Nondistended.  Min tender at lap incsions.  No incarcerated hernias. Ext:  SCDs BLE.  No significant edema.  No cyanosis Skin: No petechiae / purpura  Discharged Condition: good   Past Medical History  Diagnosis Date  . Alzheimer disease   . Hyperlipidemia     a. Myalgias with multiple statins.  . Hypertension   . GERD (gastroesophageal reflux disease)   . Diverticulosis   . Hx of adenomatous colonic polyps   . DVT (deep venous thrombosis) 09-30-11; 01/2014    RLE; RLE  . Hypothyroidism   . Asthma   . History of tobacco abuse     a. quit 25+ years ago. PFTs in 2/15 were relatively normal.   . Chronic diastolic CHF  (congestive heart failure)     a. ETT-myoview (9/12) with 4' exercise, EF 68%, no ischemia or infarction. b. Echo (10/14) with EF 55-60%, mod diast dysfunction, normal RV size/fcn, mod pulm HTN.   . Venous insufficiency     a. H/o venous stasis ulceration.   Marland Kitchen PAF (paroxysmal atrial fibrillation)     a. Only episode that noted was in setting of right upper lobectomy in 2014. She was  briefly on amiodarone but stopped it on her own. She is not coumadin. If recurrent AF, will need to consider.  . Pulmonary HTN     a. Moderate by echo 08/2013.  Marland Kitchen Cerebral infarct     a.  Remote anterior left frontal lobe infarct by CT head 01/2014.  . PE (pulmonary embolism) 01/2014  . Pneumonia     "couple times maybe" (12/13/2014)  . Arthritis     "qwhere"  . DDD (degenerative disc disease)     a. cervical and lumbar herniation and DJD s/p multiple diskectomies and other back surgeries from 2002-2008   . Chronic lower back pain   . Alzheimer's dementia     a. sees Dr. Brett Fairy. b. on Aricept.; "stage 6" (12/13/2014)  . Kidney stones   . breast dx'd 2002    left  . Adenocarcinoma of breast     a. s/p right simple and left modified radical mastectomy with chemotherapy and radiation in 2002.   . Non-small cell lung cancer     a. s/p right upper lobectomy in 2014.   Marland Kitchen ADENOCARCINOMA, BREAST, BILATERAL, HX OF 02/22/2008    Qualifier: Diagnosis of  By: Niel Hummer MD, L[place], RIGHT SHOULDER 12/15/2010    Qualifier: Diagnosis of  By: Niel Hummer MD, Lorinda Creed   . SAH (subarachnoid hemorrhage) 03/12/2014  . Ventricular tachycardia 02/28/2014    4 beat run ventricular tachycardia in the hospital, February 28, 2014     Past Surgical History  Procedure Laterality Date  . Cholecystectomy  2007  . Lumbar laminectomy  X 3  . Shoulder open rotator cuff repair Right 1995  . Hip surgery    . Abdominal hysterectomy    . Fiberoptic bronchoscopy with electromagnetic  10/07/2010  . Video bronchoscopy  N/A 05/14/2013    Procedure: VIDEO BRONCHOSCOPY;  Surgeon: Grace Isaac, MD;  Location: [facility] OR;  Service: Thoracic;  Laterality: N/A;  . Video assisted thoracoscopy (vats)/ lobectomy Right 05/14/2013    Procedure: VIDEO ASSISTED THORACOSCOPY (VATS)/ LOBECTOMY;  Surgeon: Grace Isaac, MD;  Location: [facility];  Service: Thoracic;  Laterality: Right;  . Colonoscopy  02-18-09    per Dr. Fuller Plan, adenomatous polyp, repeat in 5 yrs   . Anterior cervical decomp/discectomy fusion  X 2?  . Varicose vein surgery Right   . Hemorrhoid surgery  1985  . Kidney stone surgery  1987  . Vena cava filter placement  01/2014  . Lobectomy    . Back surgery    . Mastectomy Bilateral 04/2001  . Breast biopsy  2002  . Lithotripsy  X 2?    Social History   Social History  . Marital Status: Widowed    Spouse Name: N/A  . Number of Children: 3  . Years of Education: 11   Occupational History  .     Social History Main Topics  . Smoking status: Former Smoker -- 1.00 packs/day for 25 years    Types: Cigarettes    Start date: 06/04/1959    Quit date: 05/10/1984  . Smokeless tobacco: Never Used  . Alcohol Use: No  . Drug Use: No  . Sexual Activity: No   Other Topics Concern  . Not on file   Social History Narrative   ** Merged History Encounter **       Patient is widowed. Patient is right-handed. Patient is retired. Patient has an 11th grade education. Patient has three daughters.  Family History  Problem Relation Age of Onset  . Diabetes Daughter   . Diabetes Daughter   . Heart disease Father   . Heart disease Brother   . Dementia Brother   . Heart disease Sister   . Cancer Sister     breast  . Colon cancer Daughter 22  . Colon polyps Daughter   . Stroke Mother   . Liver disease Daughter   . Heart attack    . Cancer Sister     breast    Current Facility-Administered Medications  Medication Dose Route Frequency Provider Last Rate Last Dose  . 0.9 %  sodium chloride infusion   250 mL Intravenous PRN Michael Boston, MD      . acetaminophen (TYLENOL) tablet 650 mg  650 mg Oral Q6H PRN Armandina Gemma, MD       Or  . acetaminophen (TYLENOL) suppository 650 mg  650 mg Rectal Q6H PRN Armandina Gemma, MD      . docusate sodium (COLACE) capsule 100 mg  100 mg Oral BID Emina Riebock, NP   100 mg at 07/26/15 0949  . donepezil (ARICEPT) tablet 10 mg  10 mg Oral QHS Emina Riebock, NP   10 mg at 07/25/15 2313  . fentaNYL (SUBLIMAZE) injection 12.5-25 mcg  12.5-25 mcg Intravenous Q1H PRN Emina Riebock, NP   12.5 mcg at 07/25/15 1535  . furosemide (LASIX) tablet 20 mg  20 mg Oral Daily Emina Riebock, NP   20 mg at 07/26/15 0949  . heparin injection 5,000 Units  5,000 Units Subcutaneous 3 times per day Erby Pian, NP   5,000 Units at 07/26/15 0600  . irbesartan (AVAPRO) tablet 300 mg  300 mg Oral Daily Emina Riebock, NP   300 mg at 07/26/15 0949  . levothyroxine (SYNTHROID, LEVOTHROID) tablet 137 mcg  137 mcg Oral QAC breakfast Emina Riebock, NP   137 mcg at 07/26/15 9326  . LORazepam (ATIVAN) tablet 0.5-1 mg  0.5-1 mg Oral Q6H PRN Jackolyn Confer, MD   0.5 mg at 07/25/15 1704  . morphine (MS CONTIN) 12 hr tablet 15 mg  15 mg Oral Q12H Jackolyn Confer, MD   15 mg at 07/26/15 0949  . ondansetron (ZOFRAN-ODT) disintegrating tablet 4 mg  4 mg Oral Q6H PRN Armandina Gemma, MD       Or  . ondansetron (ZOFRAN) injection 4 mg  4 mg Intravenous Q6H PRN Armandina Gemma, MD      . pantoprazole (PROTONIX) EC tablet 80 mg  80 mg Oral Q1200 Emina Riebock, NP      . potassium chloride SA (K-DUR,KLOR-CON) CR tablet 20 mEq  20 mEq Oral Daily Jackolyn Confer, MD   20 mEq at 07/26/15 0949  . risperiDONE (RISPERDAL) tablet 0.25 mg  0.25 mg Oral QHS Emina Riebock, NP   0.25 mg at 07/25/15 2312  . sodium chloride 0.9 % injection 3 mL  3 mL Intravenous Q12H Michael Boston, MD      . sodium chloride 0.9 % injection 3 mL  3 mL Intravenous PRN Michael Boston, MD      . temazepam (RESTORIL) capsule 30 mg  30 mg Oral QHS Emina  Riebock, NP   30 mg at 07/25/15 2302     Allergies  Allergen Reactions  . Simvastatin Other (See Comments)    myalgia  . Statins Other (See Comments)    Myalgias with multiple statins   . Strawberry Swelling    Swelling ,rash  . Strawberry Hives  . Hydrocodone-Homatropine  Nausea Only    No problems with oxycodone  . Nsaids Other (See Comments)    unknown  . Penicillins Rash    Disposition: 01-Home or Self Care  Discharge Instructions    Call MD for:  extreme fatigue    Complete by:  As directed      Call MD for:  hives    Complete by:  As directed      Call MD for:  persistant nausea and vomiting    Complete by:  As directed      Call MD for:  redness, tenderness, or signs of infection (pain, swelling, redness, odor or green/yellow discharge around incision site)    Complete by:  As directed      Call MD for:  severe uncontrolled pain    Complete by:  As directed      Call MD for:    Complete by:  As directed   Temperature > 101.20F     Diet - low sodium heart healthy    Complete by:  As directed      Discharge instructions    Complete by:  As directed   Please see discharge instruction sheets.  Also refer to handout given an office.  Please call our office if you have any questions or concerns [phone]     Discharge wound care:    Complete by:  As directed   If you have closed incisions, shower and bathe over these incisions with soap and water every day.  Remove all surgical dressings on postoperative day #3.  You do not need to replace dressings over the closed incisions unless you feel more comfortable with a Band-Aid covering it.   If you have an open wound that requires packing, please see wound care instructions.  In general, remove all dressings, wash wound with soap and water and then replace with saline moistened gauze.  Do the dressing change at least every day.  Please call our office [phone] if you have further questions.     Driving Restrictions     Complete by:  As directed   No driving until off narcotics and can safely swerve away without pain during an emergency     Increase activity slowly    Complete by:  As directed   Walk an hour a day.  Use 20-30 minute walks.  When you can walk 30 minutes without difficulty, it is fine to restart low impact/moderate activities such as biking, jogging, swimming, sexual activity, etc.  Eventually you can increase to unrestricted activity when not feeling pain.  If you feel pain: STOP!Marland Kitchen   Let pain protect you from overdoing it.  Use ice/heat & over-the-counter pain medications to help minimize soreness.  If that is not enough, then use your narcotic pain prescription as needed to remain active.  It is better to take extra pain medications and be more active than to stay bedridden to avoid all pain medications.     Lifting restrictions    Complete by:  As directed   Avoid heavy lifting initially.  Do not push through pain.  You have no specific weight limit - if it hurts to do, DON'T DO IT.   If you feel no pain, you are not injuring anything.  Pain will protect you from injury.  Coughing and sneezing are far more stressful to your incision than any lifting.  Avoid resuming heavy lifting / intense activity until off all narcotic pain medications.  When ready to  exercise more, give yourself 2 weeks to gradually get back to full intense exercise/activity.     May shower / Bathe    Complete by:  As directed      May walk up steps    Complete by:  As directed      Sexual Activity Restrictions    Complete by:  As directed   Sexual activity as tolerated.  Do not push through pain.  Pain will protect you from injury.     Walk with assistance    Complete by:  As directed   Walk over an hour a day.  May use a walker/cane/companion to help with balance and stamina.            Medication List    TAKE these medications        dicyclomine 10 MG capsule  Commonly known as:  BENTYL  Take 1 capsule (10 mg  total) by mouth 2 (two) times daily as needed for spasms.     docusate sodium 100 MG capsule  Commonly known as:  COLACE  Take 1 capsule (100 mg total) by mouth 2 (two) times daily.     donepezil 10 MG tablet  Commonly known as:  ARICEPT  TAKE 1 TABLET BY MOUTH AT BEDTIME     esomeprazole 40 MG capsule  Commonly known as:  NEXIUM  Take 1 capsule (40 mg total) by mouth daily at 12 noon.     furosemide 40 MG tablet  Commonly known as:  LASIX  TAKE 1/2 TABLET BY MOUTH TWICE A DAY     levothyroxine 137 MCG tablet  Commonly known as:  SYNTHROID, LEVOTHROID  TAKE 1 TABLET (137 MCG TOTAL) BY MOUTH DAILY.     morphine 15 MG 12 hr tablet  Commonly known as:  MS CONTIN  Take 15 mg by mouth every 12 (twelve) hours.     oxyCODONE-acetaminophen 7.5-325 MG per tablet  Commonly known as:  PERCOCET  Take 1 tablet by mouth every 4 (four) hours as needed for moderate pain or severe pain.     polyethylene glycol packet  Commonly known as:  MIRALAX  Take 17 g by mouth daily as needed for moderate constipation (also available OTC).     potassium chloride SA 20 MEQ tablet  Commonly known as:  K-DUR,KLOR-CON  Take 20 mEq by mouth daily.     promethazine 12.5 MG tablet  Commonly known as:  PHENERGAN  Take 1 tablet (12.5 mg total) by mouth every 6 (six) hours as needed for nausea or vomiting.     risperiDONE 0.25 MG tablet  Commonly known as:  RISPERDAL  Take 1 tablet (0.25 mg total) by mouth at bedtime.     temazepam 30 MG capsule  Commonly known as:  RESTORIL  One half pill at night prn, take at the latest at 8 PM.     terconazole 0.8 % vaginal cream  Commonly known as:  TERAZOL 3  Place 1 applicator vaginally daily as needed (yeast).     valsartan 320 MG tablet  Commonly known as:  DIOVAN  TAKE 1 TABLET BY MOUTH EVERY DAY           Follow-up Information    Follow up with [facility] [place]. Schedule an appointment as soon as possible for a visit in 3 weeks.   Why:  To follow up  after your operation, To follow up after your hospital stay   Contact information:   [place]  [place] [address] [phone]       Signed: Morton Peters, M.D., F.A.C.S. Gastrointestinal and Minimally Invasive Surgery [facility] [address], [place], [place] [address] [phone] Main / Paging   07/26/2015, 10:15 AM

## 2015-07-28 ENCOUNTER — Encounter (HOSPITAL_COMMUNITY): Payer: Self-pay | Admitting: General Surgery

## 2015-07-30 ENCOUNTER — Telehealth: Payer: Self-pay | Admitting: Family Medicine

## 2015-07-30 ENCOUNTER — Ambulatory Visit (INDEPENDENT_AMBULATORY_CARE_PROVIDER_SITE_OTHER): Payer: Medicare Other | Admitting: Family Medicine

## 2015-07-30 ENCOUNTER — Encounter: Payer: Self-pay | Admitting: Family Medicine

## 2015-07-30 VITALS — BP 181/78 | HR 69 | Temp 98.0°F | Ht 62.0 in | Wt 170.0 lb

## 2015-07-30 DIAGNOSIS — R1084 Generalized abdominal pain: Secondary | ICD-10-CM

## 2015-07-30 LAB — POCT URINALYSIS DIPSTICK
Bilirubin, UA: NEGATIVE
Glucose, UA: NEGATIVE
KETONES UA: NEGATIVE
NITRITE UA: NEGATIVE
PH UA: 5.5
PROTEIN UA: NEGATIVE
Spec Grav, UA: 1.02
UROBILINOGEN UA: 0.2

## 2015-07-30 NOTE — Telephone Encounter (Signed)
Patient Name: Alexa [name] DOB: August 21, 1936 Initial Comment caller states mother's temp is 93.9 Nurse Assessment Nurse: Ronnald Ramp, RN, Miranda Date/Time (Eastern Time): 07/30/2015 2:41:27 PM Confirm and document reason for call. If symptomatic, describe symptoms. ---Caller states her mother had appendectomy on Friday. Her BP is 188/97 (normal 120-130/70's) and her temp is 93.9 axillary. She has been shaky. Has the patient traveled out of the country within the last 30 days? ---Not Applicable Does the patient require triage? ---Yes Related visit to physician within the last 2 weeks? ---Yes Does the PT have any chronic conditions? (i.e. diabetes, asthma, etc.) ---Yes List chronic conditions. ---Dementia, hx of back surgery/chronic back pain, HTN Guidelines Guideline Title Affirmed Question Affirmed Notes Cold Exposure (Hypothermia) History of hypothyroidism High Blood Pressure BP # 160/100 Final Disposition User See PCP When Office is Open (within 3 days) Ronnald Ramp, RN, Miranda Comments Appt scheduled for today at 4:15pm with Dr. Sarajane Jews Disagree/Comply: Comply Disagree/Comply: Comply

## 2015-07-30 NOTE — Progress Notes (Signed)
Pre visit review using our clinic review tool, if applicable. No additional management support is needed unless otherwise documented below in the visit note. 

## 2015-07-30 NOTE — Patient Outreach (Signed)
[name]) Care Management  07/30/2015  Alexa [name] 1936/04/12 [id number]   Referral from [place], assigned [name], RN to outreach.  Thanks, [name]. [name], [name] Assistant Phone: [phone] Fax: [phone]

## 2015-07-31 ENCOUNTER — Encounter: Payer: Self-pay | Admitting: Family Medicine

## 2015-07-31 LAB — HEPATIC FUNCTION PANEL
ALBUMIN: 4.1 g/dL (ref 3.5–5.2)
ALT: 14 U/L (ref 0–35)
AST: 17 U/L (ref 0–37)
Alkaline Phosphatase: 160 U/L — ABNORMAL HIGH (ref 39–117)
Bilirubin, Direct: 0.2 mg/dL (ref 0.0–0.3)
TOTAL PROTEIN: 7.8 g/dL (ref 6.0–8.3)
Total Bilirubin: 0.9 mg/dL (ref 0.2–1.2)

## 2015-07-31 LAB — CBC WITH DIFFERENTIAL/PLATELET
Basophils Absolute: 0.1 10*3/uL (ref 0.0–0.1)
Basophils Relative: 1.4 % (ref 0.0–3.0)
EOS ABS: 0.2 10*3/uL (ref 0.0–0.7)
Eosinophils Relative: 2.9 % (ref 0.0–5.0)
HCT: 43.8 % (ref 36.0–46.0)
HEMOGLOBIN: 14.7 g/dL (ref 12.0–15.0)
Lymphocytes Relative: 32.5 % (ref 12.0–46.0)
Lymphs Abs: 2.6 10*3/uL (ref 0.7–4.0)
MCHC: 33.5 g/dL (ref 30.0–36.0)
MCV: 86 fl (ref 78.0–100.0)
MONO ABS: 0.6 10*3/uL (ref 0.1–1.0)
Monocytes Relative: 7.2 % (ref 3.0–12.0)
Neutro Abs: 4.5 10*3/uL (ref 1.4–7.7)
Neutrophils Relative %: 56 % (ref 43.0–77.0)
Platelets: 317 10*3/uL (ref 150.0–400.0)
RBC: 5.1 Mil/uL (ref 3.87–5.11)
RDW: 14.2 % (ref 11.5–15.5)
WBC: 8.1 10*3/uL (ref 4.0–10.5)

## 2015-07-31 LAB — BASIC METABOLIC PANEL
BUN: 12 mg/dL (ref 6–23)
CHLORIDE: 101 meq/L (ref 96–112)
CO2: 30 mEq/L (ref 19–32)
Calcium: 9.3 mg/dL (ref 8.4–10.5)
Creatinine, Ser: 1.05 mg/dL (ref 0.40–1.20)
GFR: 53.97 mL/min — AB (ref >60.00)
GLUCOSE: 95 mg/dL (ref 70–99)
POTASSIUM: 4.1 meq/L (ref 3.5–5.1)
Sodium: 140 mEq/L (ref 135–145)

## 2015-07-31 LAB — LIPASE: LIPASE: 31 U/L (ref 11.0–59.0)

## 2015-07-31 LAB — AMYLASE: AMYLASE: 37 U/L (ref 27–131)

## 2015-07-31 NOTE — Progress Notes (Signed)
   Subjective:    Patient ID: Alexa [name], female    DOB: Mar 26, 1936, 79 y.o.   MRN: [id number]  HPI Here with her daughter for 24 hours of very non-specific complaints. She was in the hospital from 07-24-15 to 07-26-15 for acute appendicitis and underwent a laparoscopic appendectomy. The surgery was uneventful and she went home as expected. Her appetite has been slowly returning since then and she has been having normal BMs. Yesterday she started to appear pale to the family and acted as if she did not feel good. She has dementia and often does not communicate her symptoms very well. She has complained of generalized abdominal pain and says she "just doesn't feel good". There has been no fever, no nausea or vomiting. No URI sx. No urinary sx. Her daughter is quite concerned that something is wrong.    Review of Systems  Constitutional: Negative.   HENT: Negative.   Eyes: Negative.   Respiratory: Negative.   Cardiovascular: Negative.   Gastrointestinal: Positive for abdominal pain. Negative for nausea, vomiting, diarrhea, constipation, blood in stool, abdominal distention, anal bleeding and rectal pain.  Genitourinary: Negative.   Neurological: Negative.   Psychiatric/Behavioral: Positive for confusion. Negative for hallucinations, behavioral problems, sleep disturbance, dysphoric mood and agitation. The patient is not nervous/anxious.        Objective:   Physical Exam  Constitutional: She appears well-developed and well-nourished. No distress.  Eyes: Conjunctivae are normal. No scleral icterus.  Neck: Neck supple. No thyromegaly present.  Cardiovascular: Normal rate, regular rhythm, normal heart sounds and intact distal pulses.   Pulmonary/Chest: Effort normal and breath sounds normal.  Abdominal: Soft. Bowel sounds are normal. She exhibits no distension and no mass. There is no rebound and no guarding.  Slight diffuse tenderness. The surgical wound sites look clean with no erythema     Lymphadenopathy:    She has no cervical adenopathy.  Neurological: She is alert. No cranial nerve deficit. She exhibits normal muscle tone. Coordination normal.  Psychiatric: She has a normal mood and affect. Her behavior is normal.          Assessment & Plan:  This is an elderly demented woman with no specific complaints but the family is worried because they think there is a problem. She had abdominal surgery 5 days ago so our first concern would be to look for problems related to this. We will get labs today and probably arrange for a CT scan of her abdomen in the morning. The family knows to take her to the ED tonight if she gets any worse.

## 2015-08-06 ENCOUNTER — Other Ambulatory Visit: Payer: Self-pay | Admitting: *Deleted

## 2015-08-06 NOTE — Patient Outreach (Signed)
[facility]) Care Management  08/06/2015  Alexa [name] Dec 06, 1935 [id number]   Assessment: Initial patient outreach call Patient is a high risk list referral. Call placed to patient using two phone numbers provided. Home phone number is not in service. HIPAA complaint voice message left on mobile number. Care management coordinator's name and contact number provided.  Plan: Will await return call. If unable to receive a call back, will schedule for next outreach call.  Glenn Gullickson A. Jerilynn Feldmeier, BSN, RN-BC [place] Management Coordinator Cell: [phone]

## 2015-08-08 ENCOUNTER — Other Ambulatory Visit: Payer: Self-pay | Admitting: *Deleted

## 2015-08-08 NOTE — Patient Outreach (Signed)
[facility]) Care Management  08/08/2015  Alexa [name] [birth year] [id number]    Assessment: Telephone screen- second attempt Patient is a High Risk List referral. Call placed to patient on both phone numbers provided. Home phone number is not working or not in service. HIPAA compliant voice message left on mobile number with name and contact number.  Plan: Will await for return call. Will continue to reach patient for the third attempt. Will schedule patient on next outreach call.  Greyson Peavy A. Brinnley Lacap, BSN, RN-BC [place] Management Coordinator Cell: [phone]

## 2015-08-11 ENCOUNTER — Other Ambulatory Visit: Payer: Self-pay | Admitting: *Deleted

## 2015-08-11 NOTE — Patient Outreach (Addendum)
[facility]) Care Management  08/11/2015  Alexa [name] [birth year] [id number]   Assessment: Telephone screen- 3rd attempt  Patient is a High Risk referral. Call placed to patient but unable to reach her using mobile number. Home phone number not working. HIPAA compliant message left on mobile number with name and contact number.  Plan: Will await for return call. If unable to receive call back, will send a Patient Outreach Letter.  [name] A. Dionne Knoop, BSN, RN-BC [place] Management Coordinator Cell: [phone]

## 2015-08-13 ENCOUNTER — Other Ambulatory Visit: Payer: Self-pay | Admitting: *Deleted

## 2015-08-13 ENCOUNTER — Encounter: Payer: Self-pay | Admitting: *Deleted

## 2015-08-13 NOTE — Patient Outreach (Signed)
[facility]) Care Management  08/13/2015  Alexa [name] 10/30/1936 [id number]   Assessment: Unsuccessful attempts for telephone screening. Unable to receive any return calls to messages left on mobile voice mailbox. Patient's home number is not in service.  Plan: Will send Patient Outreach Letter and wait for response. If unable to hear from patient, then, will close case.  Lidia Clavijo A. Ernst Cumpston, BSN, RN-BC [place] Management Coordinator Cell: [phone]

## 2015-08-14 ENCOUNTER — Other Ambulatory Visit: Payer: Self-pay | Admitting: *Deleted

## 2015-08-14 DIAGNOSIS — C3491 Malignant neoplasm of unspecified part of right bronchus or lung: Secondary | ICD-10-CM

## 2015-08-15 ENCOUNTER — Ambulatory Visit (HOSPITAL_BASED_OUTPATIENT_CLINIC_OR_DEPARTMENT_OTHER): Payer: Medicare Other | Admitting: Family

## 2015-08-15 ENCOUNTER — Other Ambulatory Visit (HOSPITAL_BASED_OUTPATIENT_CLINIC_OR_DEPARTMENT_OTHER): Payer: Medicare Other

## 2015-08-15 ENCOUNTER — Encounter: Payer: Self-pay | Admitting: Hematology & Oncology

## 2015-08-15 VITALS — BP 188/91 | HR 66 | Temp 97.7°F | Resp 14 | Ht 62.0 in | Wt 174.0 lb

## 2015-08-15 DIAGNOSIS — Z853 Personal history of malignant neoplasm of breast: Secondary | ICD-10-CM

## 2015-08-15 DIAGNOSIS — C3491 Malignant neoplasm of unspecified part of right bronchus or lung: Secondary | ICD-10-CM

## 2015-08-15 DIAGNOSIS — Z85118 Personal history of other malignant neoplasm of bronchus and lung: Secondary | ICD-10-CM | POA: Diagnosis not present

## 2015-08-15 DIAGNOSIS — Z86711 Personal history of pulmonary embolism: Secondary | ICD-10-CM

## 2015-08-15 LAB — CBC WITH DIFFERENTIAL (CANCER CENTER ONLY)
BASO#: 0 10*3/uL (ref 0.0–0.2)
BASO%: 0.2 % (ref 0.0–2.0)
EOS%: 1.9 % (ref 0.0–7.0)
Eosinophils Absolute: 0.1 10*3/uL (ref 0.0–0.5)
HCT: 40.2 % (ref 34.8–46.6)
HGB: 13.4 g/dL (ref 11.6–15.9)
LYMPH#: 1.3 10*3/uL (ref 0.9–3.3)
LYMPH%: 25.8 % (ref 14.0–48.0)
MCH: 28.9 pg (ref 26.0–34.0)
MCHC: 33.3 g/dL (ref 32.0–36.0)
MCV: 87 fL (ref 81–101)
MONO#: 0.4 10*3/uL (ref 0.1–0.9)
MONO%: 8.5 % (ref 0.0–13.0)
NEUT#: 3.3 10*3/uL (ref 1.5–6.5)
NEUT%: 63.6 % (ref 39.6–80.0)
PLATELETS: 253 10*3/uL (ref 145–400)
RBC: 4.63 10*6/uL (ref 3.70–5.32)
RDW: 13.9 % (ref 11.1–15.7)
WBC: 5.2 10*3/uL (ref 3.9–10.0)

## 2015-08-15 LAB — COMPREHENSIVE METABOLIC PANEL
ALK PHOS: 112 U/L (ref 33–130)
ALT: 9 U/L (ref 6–29)
AST: 20 U/L (ref 10–35)
Albumin: 4 g/dL (ref 3.6–5.1)
BILIRUBIN TOTAL: 0.7 mg/dL (ref 0.2–1.2)
BUN: 13 mg/dL (ref 7–25)
CO2: 29 mmol/L (ref 20–31)
Calcium: 9.1 mg/dL (ref 8.6–10.4)
Chloride: 106 mmol/L (ref 98–110)
Creatinine, Ser: 0.96 mg/dL — ABNORMAL HIGH (ref 0.60–0.93)
GLUCOSE: 98 mg/dL (ref 65–99)
Potassium: 4.7 mmol/L (ref 3.5–5.3)
SODIUM: 143 mmol/L (ref 135–146)
Total Protein: 6.9 g/dL (ref 6.1–8.1)

## 2015-08-15 NOTE — Progress Notes (Signed)
Hematology and Oncology Follow Up Visit  JANIS SOL [id number] October 18, 1936 79 y.o. 08/15/2015   Principle Diagnosis:  Stage IB ( T2a N0M0) adenocarcinoma of the right lung Stage 1 (T1N0M0) ductal carcinoma of the LEFT breast -ER+/HER2 -. Stage 1 (T1N0M0) ER+/HER2- ductal ca of RIGHT breast Pulmonary embolism-diagnosed in April of 2015  Current Therapy:   Observation     Interim History:  Ms. Marquina is here today with her daughter for a follow-up. With her dementia most of today's information was collected from her daughter. She has 3 daughters that take turns assisting with her care. She had a CT angio in August which showed an acute appendicitis and no change in the areas of irregular mural thrombus in the abdominal aorta. She then had her appendix removed. Pathology showed an acute suppurative appendicitis with associated acute serositis.  She has had some intermittent abdominal pain but seems to be feeling better.  She has had no fever, chills, n/v, cough, rash, dizziness, headache, SOB, chest pain, palpitations ir changes in bowel or bladder habits. No blood in her urine or stool.  No swelling, tenderness, numbness or tingling in her extremities. No c/o pain at this time.  Her appetite is good and she is staying hydrated. Her weight is stable.   Medications:    Medication List       This list is accurate as of: 08/15/15 12:36 PM.  Always use your most recent med list.               dicyclomine 10 MG capsule  Commonly known as:  BENTYL  Take 1 capsule (10 mg total) by mouth 2 (two) times daily as needed for spasms.     docusate sodium 100 MG capsule  Commonly known as:  COLACE  Take 1 capsule (100 mg total) by mouth 2 (two) times daily.     donepezil 10 MG tablet  Commonly known as:  ARICEPT  TAKE 1 TABLET BY MOUTH AT BEDTIME     esomeprazole 40 MG capsule  Commonly known as:  NEXIUM  Take 1 capsule (40 mg total) by mouth daily at 12 noon.     furosemide 40  MG tablet  Commonly known as:  LASIX  TAKE 1/2 TABLET BY MOUTH TWICE A DAY     levothyroxine 137 MCG tablet  Commonly known as:  SYNTHROID, LEVOTHROID  TAKE 1 TABLET (137 MCG TOTAL) BY MOUTH DAILY.     morphine 15 MG 12 hr tablet  Commonly known as:  MS CONTIN  Take 15 mg by mouth every 12 (twelve) hours.     oxyCODONE-acetaminophen 7.5-325 MG per tablet  Commonly known as:  PERCOCET  Take 1 tablet by mouth every 4 (four) hours as needed for moderate pain or severe pain.     polyethylene glycol packet  Commonly known as:  MIRALAX  Take 17 g by mouth daily as needed for moderate constipation (also available OTC).     potassium chloride SA 20 MEQ tablet  Commonly known as:  K-DUR,KLOR-CON  Take 20 mEq by mouth daily.     promethazine 12.5 MG tablet  Commonly known as:  PHENERGAN  Take 1 tablet (12.5 mg total) by mouth every 6 (six) hours as needed for nausea or vomiting.     risperiDONE 0.25 MG tablet  Commonly known as:  RISPERDAL  Take 1 tablet (0.25 mg total) by mouth at bedtime.     temazepam 30 MG capsule  Commonly known as:  RESTORIL  One half pill at night prn, take at the latest at 8 PM.     terconazole 0.8 % vaginal cream  Commonly known as:  TERAZOL 3  Place 1 applicator vaginally daily as needed (yeast).     valsartan 320 MG tablet  Commonly known as:  DIOVAN  TAKE 1 TABLET BY MOUTH EVERY DAY        Allergies:  Allergies  Allergen Reactions  . Simvastatin Other (See Comments)    myalgia  . Statins Other (See Comments)    Myalgias with multiple statins   . Strawberry Swelling    Swelling ,rash  . Strawberry Hives  . Hydrocodone-Homatropine Nausea Only    No problems with oxycodone  . Nsaids Other (See Comments)    unknown  . Penicillins Rash    Past Medical History, Surgical history, Social history, and Family History were reviewed and updated.  Review of Systems: All other 10 point review of systems is negative.   Physical Exam:  height  is $R'5\' 2"'ZU$  (1.575 m) and weight is 174 lb (78.926 kg). Her oral temperature is 97.7 F (36.5 C). Her blood pressure is 188/91 and her pulse is 66. Her respiration is 14.   Wt Readings from Last 3 Encounters:  08/15/15 174 lb (78.926 kg)  07/30/15 170 lb (77.111 kg)  04/29/15 176 lb (79.833 kg)    Ocular: Sclerae unicteric, pupils equal, round and reactive to light Ear-nose-throat: Oropharynx clear, dentition fair Lymphatic: No cervical or supraclavicular adenopathy Lungs no rales or rhonchi, good excursion bilaterally Heart regular rate and rhythm, no murmur appreciated Abd soft, nontender, positive bowel sounds MSK no focal spinal tenderness, no joint edema Neuro: non-focal, well-oriented, appropriate affect Breasts: No changes. No mass, lesions, rash or lymphadenopathy.   Lab Results  Component Value Date   WBC 5.2 08/15/2015   HGB 13.4 08/15/2015   HCT 40.2 08/15/2015   MCV 87 08/15/2015   PLT 253 08/15/2015   No results found for: FERRITIN, IRON, TIBC, UIBC, IRONPCTSAT Lab Results  Component Value Date   RBC 4.63 08/15/2015   No results found for: Nils Pyle [facility] Lab Results  Component Value Date   IGGSERUM 1210 08/11/2009   IGA 307 08/11/2009   IGMSERUM 178 08/11/2009   Lab Results  Component Value Date   TOTALPROTELP 7.2 08/11/2009     Chemistry      Component Value Date/Time   NA 140 07/30/2015 1721   NA 145 07/22/2015 1343   NA 140 10/03/2014 0844   K 4.1 07/30/2015 1721   K 3.9 07/22/2015 1343   K 3.5 10/03/2014 0844   CL 101 07/30/2015 1721   CL 100 10/03/2014 0844   CO2 30 07/30/2015 1721   CO2 27 07/22/2015 1343   CO2 27 10/03/2014 0844   BUN 12 07/30/2015 1721   BUN 17.7 07/22/2015 1343   BUN 19 10/03/2014 0844   CREATININE 1.05 07/30/2015 1721   CREATININE 0.9 07/22/2015 1343   CREATININE 1.4* 10/03/2014 0844      Component Value Date/Time   CALCIUM 9.3 07/30/2015 1721   CALCIUM 9.4 07/22/2015 1343   CALCIUM 8.9  10/03/2014 0844   ALKPHOS 160* 07/30/2015 1721   ALKPHOS 285* 07/22/2015 1343   ALKPHOS 108* 10/03/2014 0844   AST 17 07/30/2015 1721   AST 44* 07/22/2015 1343   AST 14 10/03/2014 0844   ALT 14 07/30/2015 1721   ALT 56* 07/22/2015 1343   ALT 11 10/03/2014 0844   BILITOT 0.9 07/30/2015  1721   BILITOT 0.87 07/22/2015 1343   BILITOT 0.60 10/03/2014 0844     Impression and Plan: Ms. Ezekiel is a 79 year old female with history of lung and breast cancer. There has been no evidence of recurrence of her breast or lung cancer.  She still has her IVC filter in place. On CT there was no change in abdominal aortic areas of irregular mural thrombus.  She did however have an acute appendicitis and had an appendectomy. It has taken her several weeks to recuperate but she is finally starting to feel better.  We will see her back in 6 months for labs and follow-up. Her daughters know to contact us with any questions or concerns. We can certainly see her sooner if need be.   Eliezer Bottom, NP 9/16/201612:36 PM

## 2015-08-17 ENCOUNTER — Other Ambulatory Visit: Payer: Self-pay | Admitting: Cardiology

## 2015-08-18 ENCOUNTER — Other Ambulatory Visit: Payer: Self-pay | Admitting: *Deleted

## 2015-08-18 ENCOUNTER — Telehealth: Payer: Self-pay | Admitting: Family

## 2015-08-18 NOTE — Patient Outreach (Addendum)
[name]) Care Management  08/18/2015  Alexa [name] 1936/02/28 [id number]   Assessment: Late entry for 08/14/15 Patient is a High Risk List referral. After several attempts of calling and leaving messages to mobile voicemail, care management coordinator was able to get hold and speak to patient's daughter Alexa [name]). Identity verified. Care management coordinator introduced self and the purpose of the call. Daughter informed care management coordinator that patient has dementia and unable to speak on the phone. Daughter Alexa [name]) states that before going any further she would like to receive an e-mail to start any conversations with her. Notified daughter that [name] care management does not usually do that as a procedure or protocol but will find out further about it. Daughter stated "I don' feel good about it" (not being able to receive an e-mail from [name] to start the case). Daughter told care management coordinator that she wants to be sure of the legitimacy of the company I was calling from. Told daughter that care management coordinator will be in contact with her in any way after finding out what needs to be done.  After further discussion with co-workers, recommendation was to send patient a Patient Outreach Letter that will include a letter with letterhead and pamphlet that will contain informations about the company and services provided. Since 3 attempts had been passed in trying to reach patient, next step taken was to send a Patient Outreach letter.  Plan: Will await for any response from patient's daughter to letter sent.  Alexa [name], BSN, RN-BC [place] Management Coordinator Cell: [phone]

## 2015-08-18 NOTE — Telephone Encounter (Signed)
Talked to patient's daughter [name]. Informed [name] of patient's upcoming appt in March 2017.       [facility].

## 2015-08-21 ENCOUNTER — Other Ambulatory Visit: Payer: Self-pay | Admitting: Family Medicine

## 2015-08-25 ENCOUNTER — Other Ambulatory Visit: Payer: Self-pay

## 2015-08-25 ENCOUNTER — Telehealth: Payer: Self-pay | Admitting: Neurology

## 2015-08-25 DIAGNOSIS — F0391 Unspecified dementia with behavioral disturbance: Secondary | ICD-10-CM

## 2015-08-25 DIAGNOSIS — F03918 Unspecified dementia, unspecified severity, with other behavioral disturbance: Secondary | ICD-10-CM

## 2015-08-25 NOTE — Telephone Encounter (Signed)
Spoke to [place] at advised them that referral was placed for pt to be evaluated.

## 2015-08-25 NOTE — Telephone Encounter (Signed)
[facility] called and says the pts daughter called and says the pt is going down hill pretty fast. The daughter would like another assessment done by Hospice. Please place referral in .  [phone], May call [facility]

## 2015-08-27 ENCOUNTER — Other Ambulatory Visit: Payer: Self-pay | Admitting: Cardiology

## 2015-08-28 NOTE — Progress Notes (Signed)
Cardiology Office Note   Date:  08/29/2015   ID:  Alexa [name], DOB 1935/12/04, MRN [id number]  PCP:  Laurey Morale, MD  Cardiologist:  Dr. Loralie Champagne   Electrophysiologist:  n/a  Chief Complaint  Patient presents with  . Shortness of Breath  . Congestive Heart Failure     History of Present Illness: Alexa [name] is a 79 y.o. female with a hx of diastolic CHF, dementia, paroxysmal atrial fibrillation, DVT/PE, and lung cancer. She had paroxysmal atrial fibrillation in the setting of her right upper lobectomy in 2014. She was on amiodarone afterwards but stopped it and is not sure why.   On 02/25/14, she was admitted with dyspnea and headache. She was found to have a submassive PE with RV strain but also a traumatic subarachnoid hemorrhage from a fall. TnI went up to 1.0. It was decided not to anticoagulate her given the [facility]. Echo during this admission showed EF 55-60% with severely dilated RV and PASP 61 mmHg. She received an IVC filter. She had a repeat echo in 7/15 with EF 34-28%, PA systolic pressure 59 mmHg, moderate TR, normal RV size and systolic function.  She has seen Dr Lamonte Sakai, oxygen saturation was ok. They decided to hold off on anticoagulation.   Last seen by Dr. Loralie Champagne 10/15.  She underwent Lap Appy 07/25/15 2/2 acute appendicitis.    Here for follow-up. Overall doing well. Denies chest pain, orthopnea, PND. LE edema is stable. Dyspnea with exertion is stable. Overall she is NYHA 2b.   Studies/Reports Reviewed Today:  Echo 7/15 EF 60-65%, no RWMA, Gr 1 DD, MAC, mild MR, mild LAE, mod TR, PASP 59 mmHg  Myoview 9/12 Normal stress nuclear study. No evidence of ischemia. Normal LV function with an EF of 68%.   Past Medical History  Diagnosis Date  . Alzheimer disease   . Hyperlipidemia     a. Myalgias with multiple statins.  . Hypertension   . GERD (gastroesophageal reflux disease)   . Diverticulosis   . Hx of adenomatous colonic  polyps   . DVT (deep venous thrombosis) 09-30-11; 01/2014    RLE; RLE  . Hypothyroidism   . Asthma   . History of tobacco abuse     a. quit 25+ years ago. PFTs in 2/15 were relatively normal.   . Chronic diastolic CHF (congestive heart failure)     a. ETT-myoview (9/12) with 4' exercise, EF 68%, no ischemia or infarction. b. Echo (10/14) with EF 55-60%, mod diast dysfunction, normal RV size/fcn, mod pulm HTN.   . Venous insufficiency     a. H/o venous stasis ulceration.   Marland Kitchen PAF (paroxysmal atrial fibrillation)     a. Only episode that noted was in setting of right upper lobectomy in 2014. She was briefly on amiodarone but stopped it on her own. She is not coumadin. If recurrent AF, will need to consider.  . Pulmonary HTN     a. Moderate by echo 08/2013.  Marland Kitchen Cerebral infarct     a.  Remote anterior left frontal lobe infarct by CT head 01/2014.  . PE (pulmonary embolism) 01/2014  . Pneumonia     "couple times maybe" (12/13/2014)  . Arthritis     "qwhere"  . DDD (degenerative disc disease)     a. cervical and lumbar herniation and DJD s/p multiple diskectomies and other back surgeries from 2002-2008   . Chronic lower back pain   . Alzheimer's dementia  a. sees Dr. Brett Fairy. b. on Aricept.; "stage 6" (12/13/2014)  . Kidney stones   . breast dx'd 2002    left  . Adenocarcinoma of breast     a. s/p right simple and left modified radical mastectomy with chemotherapy and radiation in 2002.   . Non-small cell lung cancer     a. s/p right upper lobectomy in 2014.   Marland Kitchen ADENOCARCINOMA, BREAST, BILATERAL, HX OF 02/22/2008    Qualifier: Diagnosis of  By: Niel Hummer MD, [place], RIGHT SHOULDER 12/15/2010    Qualifier: Diagnosis of  By: Niel Hummer MD, Lorinda Creed   . SAH (subarachnoid hemorrhage) 03/12/2014  . Ventricular tachycardia 02/28/2014    4 beat run ventricular tachycardia in the hospital, February 28, 2014   1. hypertension 2. Hyperlipidemia: Myalgias with multiple  statins.  3. hypothyroidism 4. GERD 5. history of tobacco abuse; quit 25+ years ago. PFTs in 2/15 were relatively normal.  5. Breast adenocarcinoma s/p right simple and left modified radical mastectomy with chemotherapy and radiation in 2002.  6. cervical and lumbar herniation and DJD s/p multiple diskectomies and other back surgeries from 2002-2008 - chronic back pain being followed at the pain clinic 7. Diastolic CHF: Echo (3/00) with EF 60-65%, mild MR, moderate TR, PA systolic pressure 48 mmHg. ETT-myoview (9/12) with 4' exercise, EF 68%, no ischemia or infarction. Echo (101/4) with EF 55-60%, moderate diastolic dysfunction, PA systolic pressure 76-22 mmHg, normal RV size and systolic function. Echo (3/15) with EF 55-60%, severely dilated RV with PA systolic pressure 61 mmHg, moderate to severe TR, dilated IVC.  8. Venous insufficiency with venous stasis ulceration.  9. VTE: DVT right lower leg 11/12. Submassive PE 3/15.  10. Paroxysmal atrial fibrillation: Only episode that noted was in setting of right upper lobectomy in 2014. She was briefly on amiodarone but stopped it on her own. She is not coumadin.  11. Non-small cell lung cancer: s/p right upper lobectomy in 2014.  12. Dementia: On Aricept.   Past Surgical History  Procedure Laterality Date  . Cholecystectomy  2007  . Lumbar laminectomy  X 3  . Shoulder open rotator cuff repair Right 1995  . Hip surgery    . Abdominal hysterectomy    . Fiberoptic bronchoscopy with electromagnetic  10/07/2010  . Video bronchoscopy N/A 05/14/2013    Procedure: VIDEO BRONCHOSCOPY;  Surgeon: Grace Isaac, MD;  Location: [facility] OR;  Service: Thoracic;  Laterality: N/A;  . Video assisted thoracoscopy (vats)/ lobectomy Right 05/14/2013    Procedure: VIDEO ASSISTED THORACOSCOPY (VATS)/ LOBECTOMY;  Surgeon: Grace Isaac, MD;  Location: [facility];  Service: Thoracic;  Laterality: Right;  . Colonoscopy  02-18-09    per Dr. Fuller Plan,  adenomatous polyp, repeat in 5 yrs   . Anterior cervical decomp/discectomy fusion  X 2?  . Varicose vein surgery Right   . Hemorrhoid surgery  1985  . Kidney stone surgery  1987  . Vena cava filter placement  01/2014  . Lobectomy    . Back surgery    . Mastectomy Bilateral 04/2001  . Breast biopsy  2002  . Lithotripsy  X 2?  . Laparoscopic appendectomy N/A 07/25/2015    Procedure: APPENDECTOMY LAPAROSCOPIC;  Surgeon: Jackolyn Confer, MD;  Location: [facility];  Service: General;  Laterality: N/A;     Current Outpatient Prescriptions  Medication Sig Dispense Refill  . dicyclomine (BENTYL) 10 MG capsule Take 1 capsule (10 mg total) by mouth 2 (two)  times daily as needed for spasms. 60 capsule 0  . docusate sodium (COLACE) 100 MG capsule Take 1 capsule (100 mg total) by mouth 2 (two) times daily. 60 capsule 11  . donepezil (ARICEPT) 10 MG tablet TAKE 1 TABLET BY MOUTH AT BEDTIME 90 tablet 1  . esomeprazole (NEXIUM) 40 MG capsule Take 1 capsule (40 mg total) by mouth daily at 12 noon. 30 capsule 11  . furosemide (LASIX) 40 MG tablet Take 20 mg by mouth daily.    Marland Kitchen levothyroxine (SYNTHROID, LEVOTHROID) 137 MCG tablet TAKE 1 TABLET BY MOUTH EVERY DAY 30 tablet 3  . morphine (MS CONTIN) 15 MG 12 hr tablet Take 15 mg by mouth every 12 (twelve) hours.    Marland Kitchen oxyCODONE-acetaminophen (PERCOCET) 7.5-325 MG per tablet Take 1 tablet by mouth every 4 (four) hours as needed for moderate pain or severe pain. 30 tablet 0  . promethazine (PHENERGAN) 12.5 MG tablet Take 1 tablet (12.5 mg total) by mouth every 6 (six) hours as needed for nausea or vomiting. 30 tablet 0  . risperiDONE (RISPERDAL) 0.25 MG tablet Take 1 tablet (0.25 mg total) by mouth at bedtime. 30 tablet 5  . temazepam (RESTORIL) 30 MG capsule Take 30 mg by mouth at bedtime.    Marland Kitchen terconazole (TERAZOL 3) 0.8 % vaginal cream Place 1 applicator vaginally daily as needed (yeast).   5  . valsartan (DIOVAN) 320 MG tablet Take 1 tablet (320 mg total) by  mouth daily. 90 tablet 3  . amLODipine (NORVASC) 2.5 MG tablet Take 1 tablet (2.5 mg total) by mouth daily. 90 tablet 3  . potassium chloride SA (K-DUR,KLOR-CON) 20 MEQ tablet Take 20 mEq by mouth daily.     No current facility-administered medications for this visit.    Allergies:   Simvastatin; Statins; Strawberry; Strawberry; Hydrocodone-homatropine; Nsaids; and Penicillins    Social History:  The patient  reports that she quit smoking about 31 years ago. Her smoking use included Cigarettes. She started smoking about 56 years ago. She has a 25 pack-year smoking history. She has never used smokeless tobacco. She reports that she does not drink alcohol or use illicit drugs.   Family History:  The patient's family history includes Cancer in her sister and sister; Colon cancer (age of onset: 65) in her daughter; Colon polyps in her daughter; Dementia in her brother; Diabetes in her daughter and daughter; Heart attack in an other family member; Heart disease in her brother, father, and sister; Liver disease in her daughter; Stroke in her mother.    ROS:   Please see the history of present illness.   Review of Systems  Constitution: Positive for decreased appetite.  Cardiovascular: Positive for dyspnea on exertion and leg swelling.  Hematologic/Lymphatic: Bruises/bleeds easily.  Musculoskeletal: Positive for back pain.  Gastrointestinal: Positive for abdominal pain.  Neurological: Positive for loss of balance.  All other systems reviewed and are negative.     PHYSICAL EXAM: VS:  BP 166/82 mmHg  Pulse 79  Ht '5\' 6"'$  (1.676 m)  Wt 172 lb (78.019 kg)  BMI 27.77 kg/m2  SpO2 90%    Wt Readings from Last 3 Encounters:  08/29/15 172 lb (78.019 kg)  08/15/15 174 lb (78.926 kg)  07/30/15 170 lb (77.111 kg)     GEN: Well nourished, well developed, in no acute distress HEENT: normal Neck: no JVD, no masses Cardiac:  Normal S1/S2, RRR; no murmur ,  no rubs or gallops, trace bilateral LE  edema  Respiratory:  clear to auscultation bilaterally, no wheezing, rhonchi or rales. GI: soft, nontender, nondistended, + BS MS: no deformity or atrophy Skin: warm and dry  Neuro:  CNs II-XII intact, Strength and sensation are intact Psych: Normal affect   EKG:  EKG is not ordered today.  It demonstrates:   N/a ECG from 07/25/15: NSR, HR 61, no change from prior tracing   Recent Labs: 12/13/2014: B Natriuretic Peptide 568.1* 08/15/2015: ALT 9; BUN 13; Creatinine, Ser 0.96*; HGB 13.4; Platelets 253; Potassium 4.7; Sodium 143    Lipid Panel    Component Value Date/Time   CHOL 214* 10/02/2013 0955   TRIG 130.0 10/02/2013 0955   HDL 47.70 10/02/2013 0955   CHOLHDL 4 10/02/2013 0955   VLDL 26.0 10/02/2013 0955   LDLCALC 123* 03/02/2012 0844   LDLDIRECT 148.4 10/02/2013 0955      ASSESSMENT AND PLAN:  1. Exertional dyspnea/Pulmonary HTN/Diastolic CHF:  NYHA class 2b symptoms, relatively stable. Volume status looks ok. Last echo in 7/15 showed moderate pulmonary hypertension but the RV was normal in size and systolic function (improved).  - Continue current Lasix, wear compression stockings for lower extremity edema.   2. Atrial fibrillation: 1 documented episode post-op lobectomy. No tachypalpitations. She is no longer taking amiodarone.   3. HTN: BP uncontrolled.  Start Amlodipine 2.5 mg QD. Continue Valsartan.  Recent creatinine ok.   4. PE:  PE in 3/15 was not triggered by anything in particular and was submassive. She also fell and had a traumatic subarachnoid hemorrhage. Therefore, she had an IVC filter placed.  - She has seen Dr Lamonte Sakai and they decided to hold off anticoagulation unless there is a recurrent DVT.  - Repeat echo in 7/15 showed improvement in RV size and function. Given improved RV appearance and minimal symptoms, no planned RHC. If dyspnea worsens, will need workup for chronic thromboembolic pulmonary hypertension.      Medication  Changes: Current medicines are reviewed at length with the patient today.  Concerns regarding medicines are as outlined above.  The following changes have been made:   Discontinued Medications   FUROSEMIDE (LASIX) 40 MG TABLET    TAKE 1/2 TABLET BY MOUTH TWICE A DAY   POLYETHYLENE GLYCOL (MIRALAX) PACKET    Take 17 g by mouth daily as needed for moderate constipation (also available OTC).   TEMAZEPAM (RESTORIL) 30 MG CAPSULE    One half pill at night prn, take at the latest at 8 PM.   VALSARTAN (DIOVAN) 320 MG TABLET    TAKE 1 TABLET BY MOUTH EVERY DAY   Modified Medications   Modified Medication Previous Medication   PROMETHAZINE (PHENERGAN) 12.5 MG TABLET promethazine (PHENERGAN) 12.5 MG tablet      Take 1 tablet (12.5 mg total) by mouth every 6 (six) hours as needed for nausea or vomiting.    Take 1 tablet (12.5 mg total) by mouth every 6 (six) hours as needed for nausea or vomiting.   VALSARTAN (DIOVAN) 320 MG TABLET valsartan (DIOVAN) 320 MG tablet      Take 1 tablet (320 mg total) by mouth daily.    TAKE 1 TABLET BY MOUTH EVERY DAY   New Prescriptions   AMLODIPINE (NORVASC) 2.5 MG TABLET    Take 1 tablet (2.5 mg total) by mouth daily.   Labs/ tests ordered today include:   No orders of the defined types were placed in this encounter.      Disposition:    FU with Dr. Kirk Ruths  [place] 6 mos.     Signed, Versie Starks, MHS 08/29/2015 10:41 AM    [facility] [address], [place], [place]  [address] Phone: [phone]; Fax: [phone]

## 2015-08-29 ENCOUNTER — Encounter: Payer: Self-pay | Admitting: Physician Assistant

## 2015-08-29 ENCOUNTER — Ambulatory Visit (INDEPENDENT_AMBULATORY_CARE_PROVIDER_SITE_OTHER): Payer: Medicare Other | Admitting: Physician Assistant

## 2015-08-29 VITALS — BP 166/82 | HR 79 | Ht 66.0 in | Wt 172.0 lb

## 2015-08-29 DIAGNOSIS — R0602 Shortness of breath: Secondary | ICD-10-CM

## 2015-08-29 DIAGNOSIS — I270 Primary pulmonary hypertension: Secondary | ICD-10-CM

## 2015-08-29 DIAGNOSIS — I10 Essential (primary) hypertension: Secondary | ICD-10-CM

## 2015-08-29 DIAGNOSIS — I480 Paroxysmal atrial fibrillation: Secondary | ICD-10-CM | POA: Diagnosis not present

## 2015-08-29 DIAGNOSIS — Z86711 Personal history of pulmonary embolism: Secondary | ICD-10-CM

## 2015-08-29 DIAGNOSIS — I5032 Chronic diastolic (congestive) heart failure: Secondary | ICD-10-CM

## 2015-08-29 DIAGNOSIS — I272 Pulmonary hypertension, unspecified: Secondary | ICD-10-CM

## 2015-08-29 MED ORDER — PROMETHAZINE HCL 12.5 MG PO TABS: 12.5000 mg | ORAL_TABLET | Freq: Four times a day (QID) | ORAL | Status: DC | PRN

## 2015-08-29 MED ORDER — AMLODIPINE BESYLATE 2.5 MG PO TABS: 2.5000 mg | ORAL_TABLET | Freq: Every day | ORAL | Status: AC

## 2015-08-29 MED ORDER — VALSARTAN 320 MG PO TABS: 320.0000 mg | ORAL_TABLET | Freq: Every day | ORAL | Status: AC

## 2015-08-29 NOTE — Patient Instructions (Signed)
Medication Instructions:  1. REFILLS HAVE BEEN SENT IN FOR DIOVAN; PHENERGAN  2. START AMLODIPINE 2.5 MG 1 TABLET DAILY   Labwork: NONE  Testing/Procedures: NONE  Follow-Up: Your physician wants you to follow-up in: [place] DR. Aundra Dubin You will receive a reminder letter in the mail two months in advance. If you don't receive a letter, please call our office to schedule the follow-up appointment.   Any Other Special Instructions Will Be Listed Below (If Applicable). MONITOR BP 4-5 X A WEEK; CALL THE OFFICE IN 2-3 WEEKS WITH READINGS AFTER [phone]

## 2015-08-31 ENCOUNTER — Other Ambulatory Visit: Payer: Self-pay | Admitting: Cardiology

## 2015-09-02 ENCOUNTER — Telehealth: Payer: Self-pay

## 2015-09-02 NOTE — Telephone Encounter (Signed)
Spoke to Dr. Jenene Slicker from [facility]. He wanted to advise Dr. Brett Fairy that pt is still not eligible for hospice. Pt is still ambulatory and not even incontinent yet. He says that he may be able to do more for pt on the palliative care side. He just wanted to inform Dr. Brett Fairy. Dr. Brett Fairy agreed to let Dr. Jenene Slicker do what he could for the family and pt with palliative care.

## 2015-09-06 ENCOUNTER — Encounter (HOSPITAL_COMMUNITY): Payer: Self-pay | Admitting: Emergency Medicine

## 2015-09-06 ENCOUNTER — Emergency Department (HOSPITAL_COMMUNITY)
Admission: EM | Admit: 2015-09-06 | Discharge: 2015-09-06 | Disposition: A | Discharge status: Home / Self Care | Payer: Medicare Other | Attending: Emergency Medicine | Admitting: Emergency Medicine

## 2015-09-06 DIAGNOSIS — Z86718 Personal history of other venous thrombosis and embolism: Secondary | ICD-10-CM | POA: Insufficient documentation

## 2015-09-06 DIAGNOSIS — F0280 Dementia in other diseases classified elsewhere without behavioral disturbance: Secondary | ICD-10-CM | POA: Insufficient documentation

## 2015-09-06 DIAGNOSIS — Z8739 Personal history of other diseases of the musculoskeletal system and connective tissue: Secondary | ICD-10-CM | POA: Insufficient documentation

## 2015-09-06 DIAGNOSIS — J45909 Unspecified asthma, uncomplicated: Secondary | ICD-10-CM | POA: Diagnosis not present

## 2015-09-06 DIAGNOSIS — Z79899 Other long term (current) drug therapy: Secondary | ICD-10-CM | POA: Insufficient documentation

## 2015-09-06 DIAGNOSIS — Y998 Other external cause status: Secondary | ICD-10-CM | POA: Diagnosis not present

## 2015-09-06 DIAGNOSIS — K219 Gastro-esophageal reflux disease without esophagitis: Secondary | ICD-10-CM | POA: Diagnosis not present

## 2015-09-06 DIAGNOSIS — G309 Alzheimer's disease, unspecified: Secondary | ICD-10-CM | POA: Insufficient documentation

## 2015-09-06 DIAGNOSIS — Z23 Encounter for immunization: Secondary | ICD-10-CM | POA: Insufficient documentation

## 2015-09-06 DIAGNOSIS — G8929 Other chronic pain: Secondary | ICD-10-CM | POA: Insufficient documentation

## 2015-09-06 DIAGNOSIS — I10 Essential (primary) hypertension: Secondary | ICD-10-CM | POA: Diagnosis not present

## 2015-09-06 DIAGNOSIS — W228XXA Striking against or struck by other objects, initial encounter: Secondary | ICD-10-CM | POA: Diagnosis not present

## 2015-09-06 DIAGNOSIS — Z86711 Personal history of pulmonary embolism: Secondary | ICD-10-CM | POA: Insufficient documentation

## 2015-09-06 DIAGNOSIS — Z880 Allergy status to penicillin: Secondary | ICD-10-CM | POA: Insufficient documentation

## 2015-09-06 DIAGNOSIS — Y9389 Activity, other specified: Secondary | ICD-10-CM | POA: Insufficient documentation

## 2015-09-06 DIAGNOSIS — M1990 Unspecified osteoarthritis, unspecified site: Secondary | ICD-10-CM | POA: Insufficient documentation

## 2015-09-06 DIAGNOSIS — I5032 Chronic diastolic (congestive) heart failure: Secondary | ICD-10-CM | POA: Insufficient documentation

## 2015-09-06 DIAGNOSIS — Z8701 Personal history of pneumonia (recurrent): Secondary | ICD-10-CM | POA: Diagnosis not present

## 2015-09-06 DIAGNOSIS — Z86010 Personal history of colonic polyps: Secondary | ICD-10-CM | POA: Insufficient documentation

## 2015-09-06 DIAGNOSIS — Z87442 Personal history of urinary calculi: Secondary | ICD-10-CM | POA: Insufficient documentation

## 2015-09-06 DIAGNOSIS — Z87891 Personal history of nicotine dependence: Secondary | ICD-10-CM | POA: Insufficient documentation

## 2015-09-06 DIAGNOSIS — S81802A Unspecified open wound, left lower leg, initial encounter: Secondary | ICD-10-CM | POA: Insufficient documentation

## 2015-09-06 DIAGNOSIS — S81812A Laceration without foreign body, left lower leg, initial encounter: Secondary | ICD-10-CM

## 2015-09-06 DIAGNOSIS — Y9289 Other specified places as the place of occurrence of the external cause: Secondary | ICD-10-CM | POA: Diagnosis not present

## 2015-09-06 DIAGNOSIS — E039 Hypothyroidism, unspecified: Secondary | ICD-10-CM | POA: Insufficient documentation

## 2015-09-06 DIAGNOSIS — Z853 Personal history of malignant neoplasm of breast: Secondary | ICD-10-CM | POA: Insufficient documentation

## 2015-09-06 DIAGNOSIS — S8992XA Unspecified injury of left lower leg, initial encounter: Secondary | ICD-10-CM | POA: Diagnosis present

## 2015-09-06 LAB — URINALYSIS, ROUTINE W REFLEX MICROSCOPIC
BILIRUBIN URINE: NEGATIVE
GLUCOSE, UA: NEGATIVE mg/dL
HGB URINE DIPSTICK: NEGATIVE
Ketones, ur: NEGATIVE mg/dL
Leukocytes, UA: NEGATIVE
Nitrite: NEGATIVE
PH: 7 (ref 5.0–8.0)
Protein, ur: NEGATIVE mg/dL
SPECIFIC GRAVITY, URINE: 1.007 (ref 1.005–1.030)
UROBILINOGEN UA: 0.2 mg/dL (ref 0.0–1.0)

## 2015-09-06 MED ORDER — ACETAMINOPHEN 325 MG PO TABS
650.0000 mg | ORAL_TABLET | Freq: Once | ORAL | Status: DC
  Filled 2015-09-06: qty 2

## 2015-09-06 MED ORDER — TETANUS-DIPHTH-ACELL PERTUSSIS 5-2.5-18.5 LF-MCG/0.5 IM SUSP
0.5000 mL | Freq: Once | INTRAMUSCULAR | Status: AC
  Administered 2015-09-06: 0.5 mL via INTRAMUSCULAR
  Filled 2015-09-06: qty 0.5

## 2015-09-06 MED ORDER — HYDROMORPHONE HCL 1 MG/ML IJ SOLN
1.0000 mg | Freq: Once | INTRAMUSCULAR | Status: AC
  Administered 2015-09-06: 1 mg via INTRAMUSCULAR
  Filled 2015-09-06: qty 1

## 2015-09-06 NOTE — ED Notes (Signed)
Bed: WA17 Expected date:  Expected time:  Means of arrival:  Comments: EMS- Leg lac

## 2015-09-06 NOTE — Discharge Instructions (Signed)
Deep Skin Avulsion A deep skin avulsion is a type of open wound. It often results from a severe injury (trauma) that tears away all layers of the skin or an entire body part. The areas of the body that are most often affected by a deep skin avulsion include the face, lips, ears, nose, and fingers. A deep skin avulsion may make structures below the skin become visible. You may be able to see muscle, bone, nerves, and blood vessels. A deep skin avulsion can also damage important structures beneath the skin. These include tendons, ligaments, nerves, or blood vessels. CAUSES Injuries that often cause a deep skin avulsion include:  Being crushed.  Falling against a jagged surface.  Animal bites.  Gunshot wounds.  Severe burns.  Injuries that involve being dragged, such as bicycle or motorcycle accidents. SYMPTOMS Symptoms of a deep skin avulsion include:  Pain.  Numbness.  Swelling.  A misshapen body part.  Bleeding, which may be heavy.  Fluid leaking from the wound. DIAGNOSIS This condition may be diagnosed with a medical history and physical exam. You may also have X-rays done. TREATMENT The treatment that is chosen for a deep skin avulsion depends on how large and deep the wound is and where it is located. Treatment for all types of avulsions usually starts with:  Controlling the bleeding.  Washing out the wound with a germ-free (sterile) salt-water solution.  Removing dead tissue from the wound. A wound may be closed or left open to heal. This depends on the size and location of the wound and whether it is likely to become infected. Wounds are usually covered or closed if they expose blood vessels, nerves, bone, or cartilage.  Wounds that are small and clean may be closed with stitches (sutures).  Wounds that cannot be closed with sutures may be covered with a piece of skin (graft) or a skin flap. Skin may be taken from on or near the wound, from another part of the body,  or from a donor.  Wounds may be left open if they are hard to close or they may become infected. These wounds heal over time from the bottom up. You may also receive medicine. This may include:  Antibiotics.  A tetanus shot.  Rabies vaccine. HOME CARE INSTRUCTIONS Medicines  Take or apply over-the-counter and prescription medicines only as told by your health care provider.  If you were prescribed an antibiotic, take or apply it as told by your health care provider. Do not stop taking the antibiotic even if your condition improves.  You may get anti-itch medicine while your wound is healing. Use it only as told by your health care provider. Wound Care  There are many ways to close and cover a wound. For example, a wound can be covered with sutures, skin glue, or adhesive strips. Follow instructions from your health care provider about:  How to take care of your wound.  When and how you should change your bandage (dressing).  When you should remove your dressing.  Removing whatever was used to close your wound.  Keep the dressing dry as told by your health care provider. Do not take baths, swim, use a hot tub, or do anything that would put your wound underwater until your health care provider approves.  Clean the wound each day or as told by your health care provider.  Wash the wound with mild soap and water.  Rinse the wound with water to remove all soap.  Pat the wound  dry with a clean towel. Do not rub it.  Do not scratch or pick at the wound.  Check your wound every day for signs of infection. Watch for:  Redness, swelling, or pain.  Fluid, blood, or pus. General Instructions  Raise (elevate) the injured area above the level of your heart while you are sitting or lying down.  Keep all follow-up visits as told by your health care provider. This is important. SEEK MEDICAL CARE IF:  You received a tetanus shot and you have swelling, severe pain, redness, or  bleeding at the injection site.  You have a fever.  Your pain is not controlled with medicine.  You have increased redness, swelling, or pain at the site of your wound.  You have fluid, blood, or pus coming from your wound.  You notice a bad smell coming from your wound or your dressing.  A wound that was closed breaks open.  You notice something coming out of the wound, such as wood or glass.  You notice a change in the color of your skin near your wound.  You develop a new rash.  You need to change the dressing frequently due to fluid, blood, or pus draining from the wound. SEEK IMMEDIATE MEDICAL CARE IF:  Your pain suddenly increases and is severe.  You develop severe swelling around the wound.  You develop numbness around the wound.  You have nausea and vomiting that does not go away after 24 hours.  You feel light-headed, weak, or faint.  You develop chest pain.  You have trouble breathing.  Your wound is on your hand or foot and you cannot properly move a finger or toe.  The wound is on your hand or foot and you notice that your fingers or toes look pale or bluish.  You have a red streak going away from your wound.   This information is not intended to replace advice given to you by your health care provider. Make sure you discuss any questions you have with your health care provider.   Document Released: 01/11/2007 Document Revised: 04/01/2015 Document Reviewed: 11/20/2014 Elsevier Interactive Patient Education 2016 [place], or Adhesive Wound Closure Doctors use stitches (sutures), staples, and certain glue (skin adhesives) to hold your skin together while it heals (wound closure). You may need this treatment after you have surgery or if you cut your skin accidentally. These methods help your skin heal more quickly. They also make it less likely that you will have a scar. WHAT ARE THE DIFFERENT KINDS OF WOUND CLOSURES? There are many  options for wound closure. The one that your doctor uses depends on how deep and large your wound is. Adhesive Glue To use this glue to close a wound, your doctor holds the edges of the wound together and paints the glue on the surface of your skin. You may need more than one layer of glue. Then the wound may be covered with a light bandage (dressing). This type of skin closure may be used for small wounds that are not deep (superficial). Using glue for wound closure is less painful than other methods. It does not require a medicine that numbs the area. This method also leaves nothing to be removed. Adhesive glue is often used for children and on facial wounds. Adhesive glue cannot be used for wounds that are deep, uneven, or bleeding. It is not used inside of a wound.  Adhesive Strips These strips are made of sticky (adhesive), porous  paper. They are placed across your skin edges like a regular adhesive bandage. You leave them on until they fall off. Adhesive strips may be used to close very superficial wounds. They may also be used along with sutures to improve closure of your skin edges.  Sutures Sutures are the oldest method of wound closure. Sutures can be made from natural or synthetic materials. They can be made from a material that your body can break down as your wound heals (absorbable), or they can be made from a material that needs to be removed from your skin (nonabsorbable). They come in many different strengths and sizes. Your doctor attaches the sutures to a steel needle on one end. Sutures can be passed through your skin, or through the tissues beneath your skin. Then they are tied and cut. Your skin edges may be closed in one continuous stitch or in separate stitches. Sutures are strong and can be used for all kinds of wounds. Absorbable sutures may be used to close tissues under the skin. The disadvantage of sutures is that they may cause skin reactions that lead to infection.  Nonabsorbable sutures need to be removed. Staples When surgical staples are used to close a wound, the edges of your skin on both sides of the wound are brought close together. A staple is placed across the wound, and an instrument secures the edges together. Staples are often used to close surgical cuts (incisions). Staples are faster to use than sutures, and they cause less reaction from your skin. Staples need to be removed using a tool that bends the staples away from your skin. HOW DO I CARE FOR MY WOUND CLOSURE?  Take medicines only as told by your doctor.  If you were prescribed an antibiotic medicine for your wound, finish it all even if you start to feel better.  Use ointments or creams only as told by your doctor.  Wash your hands with soap and water before and after touching your wound.  Do not soak your wound in water. Do not take baths, swim, or use a hot tub until your doctor says it is okay.  Ask your doctor when you can start showering. Cover your wound if told by your doctor.  Do not take out your own sutures or staples.  Do not pick at your wound. Picking can cause an infection.  Keep all follow-up visits as told by your doctor. This is important. HOW LONG WILL I HAVE MY WOUND CLOSURE?   Leave adhesive glue on your skin until the glue peels away.  Leave adhesive strips on your skin until they fall off.  Absorbable sutures will dissolve within several days.  Nonabsorbable sutures and staples must be removed. The location of the wound will determine how long they stay in. This can range from several days to a couple of weeks. WHEN SHOULD I SEEK HELP FOR MY WOUND CLOSURE? Contact your doctor if:  You have a fever.  You have chills.  You have redness, puffiness (swelling), or pain at the site of your wound.  You have fluid, blood, or pus coming from your wound.  There is a bad smell coming from your wound.  The skin edges of your wound start to separate  after your sutures have been removed.  Your wound becomes thick, raised, and darker in color after your sutures come out (scarring).   This information is not intended to replace advice given to you by your health care provider. Make sure you  discuss any questions you have with your health care provider.   Document Released: 09/12/2009 Document Revised: 12/06/2014 Document Reviewed: 04/24/2014 Elsevier Interactive Patient Education [employer].

## 2015-09-06 NOTE — ED Provider Notes (Signed)
CSN: [id number]     Arrival date & time 09/06/15  1324 History   First MD Initiated Contact with Patient 09/06/15 1348     Chief Complaint  Patient presents with  . Extremity Laceration     (Consider location/radiation/quality/duration/timing/severity/associated sxs/prior Treatment) HPI   79 year old female with history of Alzheimer disease brought here from home via EMS for graduation LACERATION to her left leg. Per daughter who is at bedside, she was trying to step on a raised porch and hits her left leg on the concrete edge. She suffered an approximately 2 inches of laceration to the affected area. Bleeding is controlled and patient does not take any blood thinner medication. She has history of dementia with family at bedside report that patient is at her normal baseline. No report of falling and no head injury. Injury was witnessed by daughter.  Unsure tdap status.  Pt is moaning in pain.    Past Medical History  Diagnosis Date  . Alzheimer disease   . Hyperlipidemia     a. Myalgias with multiple statins.  . Hypertension   . GERD (gastroesophageal reflux disease)   . Diverticulosis   . Hx of adenomatous colonic polyps   . DVT (deep venous thrombosis) ([place]) 09-30-11; 01/2014    RLE; RLE  . Hypothyroidism   . Asthma   . History of tobacco abuse     a. quit 25+ years ago. PFTs in 2/15 were relatively normal.   . Chronic diastolic CHF (congestive heart failure) ([place])     a. ETT-myoview (9/12) with 4' exercise, EF 68%, no ischemia or infarction. b. Echo (10/14) with EF 55-60%, mod diast dysfunction, normal RV size/fcn, mod pulm HTN.   . Venous insufficiency     a. H/o venous stasis ulceration.   Marland Kitchen PAF (paroxysmal atrial fibrillation) ([place])     a. Only episode that noted was in setting of right upper lobectomy in 2014. She was briefly on amiodarone but stopped it on her own. She is not coumadin. If recurrent AF, will need to consider.  . Pulmonary HTN ([place])     a. Moderate by echo  08/2013.  Marland Kitchen Cerebral infarct ([place])     a.  Remote anterior left frontal lobe infarct by CT head 01/2014.  . PE (pulmonary embolism) 01/2014  . Pneumonia     "couple times maybe" (12/13/2014)  . Arthritis     "qwhere"  . DDD (degenerative disc disease)     a. cervical and lumbar herniation and DJD s/p multiple diskectomies and other back surgeries from 2002-2008   . Chronic lower back pain   . Alzheimer's dementia     a. sees Dr. Brett Fairy. b. on Aricept.; "stage 6" (12/13/2014)  . Kidney stones   . breast dx'd 2002    left  . Adenocarcinoma of breast ([place])     a. s/p right simple and left modified radical mastectomy with chemotherapy and radiation in 2002.   . Non-small cell lung cancer ([place])     a. s/p right upper lobectomy in 2014.   Marland Kitchen ADENOCARCINOMA, BREAST, BILATERAL, HX OF 02/22/2008    Qualifier: Diagnosis of  By: Niel Hummer MD, [place], RIGHT SHOULDER 12/15/2010    Qualifier: Diagnosis of  By: Niel Hummer MD, Lorinda Creed   . SAH (subarachnoid hemorrhage) ([place]) 03/12/2014  . Ventricular tachycardia ([place]) 02/28/2014    4 beat run ventricular tachycardia in the hospital, February 28, 2014    Past Surgical  History  Procedure Laterality Date  . Cholecystectomy  2007  . Lumbar laminectomy  X 3  . Shoulder open rotator cuff repair Right 1995  . Hip surgery    . Abdominal hysterectomy    . Fiberoptic bronchoscopy with electromagnetic  10/07/2010  . Video bronchoscopy N/A 05/14/2013    Procedure: VIDEO BRONCHOSCOPY;  Surgeon: Grace Isaac, MD;  Location: [facility] OR;  Service: Thoracic;  Laterality: N/A;  . Video assisted thoracoscopy (vats)/ lobectomy Right 05/14/2013    Procedure: VIDEO ASSISTED THORACOSCOPY (VATS)/ LOBECTOMY;  Surgeon: Grace Isaac, MD;  Location: [facility];  Service: Thoracic;  Laterality: Right;  . Colonoscopy  02-18-09    per Dr. Fuller Plan, adenomatous polyp, repeat in 5 yrs   . Anterior cervical decomp/discectomy fusion  X 2?  . Varicose vein surgery  Right   . Hemorrhoid surgery  1985  . Kidney stone surgery  1987  . Vena cava filter placement  01/2014  . Lobectomy    . Back surgery    . Mastectomy Bilateral 04/2001  . Breast biopsy  2002  . Lithotripsy  X 2?  . Laparoscopic appendectomy N/A 07/25/2015    Procedure: APPENDECTOMY LAPAROSCOPIC;  Surgeon: Jackolyn Confer, MD;  Location: [facility] ORS;  Service: General;  Laterality: N/A;   Family History  Problem Relation Age of Onset  . Diabetes Daughter   . Diabetes Daughter   . Heart disease Father   . Heart disease Brother   . Dementia Brother   . Heart disease Sister   . Cancer Sister     breast  . Colon cancer Daughter 34  . Colon polyps Daughter   . Stroke Mother   . Liver disease Daughter   . Heart attack    . Cancer Sister     breast   Social History  Substance Use Topics  . Smoking status: Former Smoker -- 1.00 packs/day for 25 years    Types: Cigarettes    Start date: 06/04/1959    Quit date: 05/10/1984  . Smokeless tobacco: Never Used  . Alcohol Use: No   OB History    Gravida Para Term Preterm AB TAB SAB Ectopic Multiple Living   0 0 0 0 0 0 0 0       Review of Systems  Constitutional: Negative for fever.  Skin: Positive for wound.  Neurological: Negative for numbness.      Allergies  Simvastatin; Statins; Strawberry; Strawberry; Hydrocodone-homatropine; Nsaids; and Penicillins  Home Medications   Prior to Admission medications   Medication Sig Start Date End Date Taking? Authorizing Provider  amLODipine (NORVASC) 2.5 MG tablet Take 1 tablet (2.5 mg total) by mouth daily. 08/29/15   Liliane Shi, PA-C  dicyclomine (BENTYL) 10 MG capsule Take 1 capsule (10 mg total) by mouth 2 (two) times daily as needed for spasms. 10/15/14   Willia Craze, NP  docusate sodium (COLACE) 100 MG capsule Take 1 capsule (100 mg total) by mouth 2 (two) times daily. 03/21/15   Laurey Morale, MD  donepezil (ARICEPT) 10 MG tablet TAKE 1 TABLET BY MOUTH AT BEDTIME 07/18/15    Larey Seat, MD  esomeprazole (NEXIUM) 40 MG capsule Take 1 capsule (40 mg total) by mouth daily at 12 noon. 03/21/15   Laurey Morale, MD  furosemide (LASIX) 40 MG tablet Take 20 mg by mouth daily.    Historical Provider, MD  levothyroxine (SYNTHROID, LEVOTHROID) 137 MCG tablet TAKE 1 TABLET BY MOUTH EVERY DAY 08/22/15  Laurey Morale, MD  morphine (MS CONTIN) 15 MG 12 hr tablet Take 15 mg by mouth every 12 (twelve) hours. 07/23/15   Historical Provider, MD  oxyCODONE-acetaminophen (PERCOCET) 7.5-325 MG per tablet Take 1 tablet by mouth every 4 (four) hours as needed for moderate pain or severe pain. 07/26/15   Michael Boston, MD  potassium chloride SA (K-DUR,KLOR-CON) 20 MEQ tablet Take 20 mEq by mouth daily. 02/13/14   Larey Dresser, MD  promethazine (PHENERGAN) 12.5 MG tablet Take 1 tablet (12.5 mg total) by mouth every 6 (six) hours as needed for nausea or vomiting. 08/29/15   Liliane Shi, PA-C  risperiDONE (RISPERDAL) 0.25 MG tablet Take 1 tablet (0.25 mg total) by mouth at bedtime. 03/21/15   Laurey Morale, MD  temazepam (RESTORIL) 30 MG capsule Take 30 mg by mouth at bedtime.    Historical Provider, MD  terconazole (TERAZOL 3) 0.8 % vaginal cream Place 1 applicator vaginally daily as needed (yeast).  01/12/15   Historical Provider, MD  valsartan (DIOVAN) 320 MG tablet Take 1 tablet (320 mg total) by mouth daily. 08/29/15   Scott T Weaver, PA-C   BP 178/77 mmHg  Pulse 78  Resp 18  SpO2 96% Physical Exam  Constitutional: She appears well-developed and well-nourished. No distress.  HENT:  Head: Atraumatic.  Eyes: Conjunctivae are normal.  Neck: Neck supple.  Neurological: She is alert.  Skin: No rash noted.  L lower anterior leg: a stellate skin tear in a "W" shape measuring approximately 12 cm in combine length.  Not actively bleeding, no fb noted, no deep structure exposed.  Ttp.   Psychiatric: She has a normal mood and affect.  Nursing note and vitals reviewed.   ED Course   Procedures (including critical care time)  Patient had a mechanical injury when she suffered a superficial skin tear noted to her left low anterior leg. Plan to cleans the wound, and applying Steri-Strip. Will update her tetanus status. I will also check a UA.  Pain medication given.    Procedure: Laceration Repair: sterile strip  Diagnosis: Laceration  Laceration Length: 12  Laceration Site: L lower anterior leg  Laceration Complexity:  Complex, superficial  Procedure performed by: Domenic Moras, PA-C  Informed Consent: Verbal informed consent was obtained.  Verification: I have verified the correct patient, correct procedure, correct position, correct site/side, and available equipment. The site was marked.  Anesthesia/Sedation: none  Procedure Note: Universal precautions were observed. The patient was prepped and draped in a sterile fashion. The wound was explored under a clean, dry, and bloodless field through full range of motion. There were no obvious tendon lacerations or foreign bodies in the wound as it is superficial. The wound was irrigated and cleaned. The wound was closed with sterile strips with loose approximation. There were no complications. A dressing was applied. Pt remained neurovascularly intact following procedure.  Blood Loss: Minimal  3:33 PM Wound were cleansed and sterile strip.  Pain medication given.  Wound care discussed with family.  Pt to f/u with PCP for wound recheck.  Monitor for signs of infection.   Labs Review Labs Reviewed  URINALYSIS, ROUTINE W REFLEX MICROSCOPIC (NOT AT [facility])    Imaging Review No results found. I have personally reviewed and evaluated these images and lab results as part of my medical decision-making.   EKG Interpretation None      MDM   Final diagnoses:  Skin tear of left lower leg without complication, initial encounter  BP 178/77 mmHg  Pulse 78  Resp 18  SpO2 96%  The patient was noted to be  hypertensive today in the emergency department. I have spoken with the patient and family regarding hypertension and the need for improved management. I instructed the patient to followup with the Primary care doctor within 4 days to improve the management of the patient's hypertension. I also counseled the patient regarding the signs and symptoms which would require an emergent visit to an emergency department for hypertensive urgency and/or hypertensive emergency. The patient understood the need for improved hypertensive management.     Domenic Moras, PA-C 09/06/15 [place], DO 09/06/15 1536

## 2015-09-06 NOTE — ED Notes (Addendum)
Pt from home via EMS c/o laceration to L leg. Pt sts that she tried to step onto a raised porch and hit her leg on the concrete edge. Pt has approx 2 in lac/skin tear. Bleeding controlled and pt does not take blood thinners. Pt has hx of CHF and lower extremity edema. Pt has hx of dementia but family at bedside reports that pt is at her normal baseline. Pt did not fall or hit her head.

## 2015-09-08 ENCOUNTER — Encounter: Payer: Self-pay | Admitting: Family Medicine

## 2015-09-08 ENCOUNTER — Ambulatory Visit (INDEPENDENT_AMBULATORY_CARE_PROVIDER_SITE_OTHER): Payer: Medicare Other | Admitting: Family Medicine

## 2015-09-08 VITALS — BP 101/69 | HR 90 | Temp 98.2°F

## 2015-09-08 DIAGNOSIS — S81812D Laceration without foreign body, left lower leg, subsequent encounter: Secondary | ICD-10-CM

## 2015-09-08 DIAGNOSIS — Z23 Encounter for immunization: Secondary | ICD-10-CM

## 2015-09-08 DIAGNOSIS — F411 Generalized anxiety disorder: Secondary | ICD-10-CM | POA: Insufficient documentation

## 2015-09-08 MED ORDER — LORAZEPAM 0.5 MG PO TABS
0.5000 mg | ORAL_TABLET | Freq: Three times a day (TID) | ORAL | Status: DC | PRN
Start: 2015-09-08 — End: 2015-11-24

## 2015-09-08 NOTE — Progress Notes (Signed)
Pre visit review using our clinic review tool, if applicable. No additional management support is needed unless otherwise documented below in the visit note. Pt unable to weigh

## 2015-09-08 NOTE — Progress Notes (Signed)
   Subjective:    Patient ID: Alexa [name], female    DOB: Feb 10, 1936, 79 y.o.   MRN: [id number]  HPI Here with her daughter to recheck a laceration to the left shin which occurred at home on 09-06-15. She struck the leg against the edge of a concrete step. She was taken to the ER where the wound was cleaned out and Steristrips were applied. It does not seem to hurt her at this point. Also her daughter asks if we can provide a mild medication that they can give her for anxiety. She often gets very anxious and jittery.    Review of Systems  Constitutional: Negative.   Respiratory: Negative.   Cardiovascular: Negative.   Skin: Positive for wound.       Objective:   Physical Exam  Constitutional: She appears well-developed and well-nourished.  Cardiovascular: Normal rate, regular rhythm, normal heart sounds and intact distal pulses.   Pulmonary/Chest: Effort normal and breath sounds normal.  Skin:  Superficial wound to the shin with several flaps held in place with Steristrips. No signs of infection.   Psychiatric: She has a normal mood and affect. Her behavior is normal.          Assessment & Plan:  The laceration looks good today so we will redress it today. She is given some Ativan 0.5 mg to use prn for the anxiety.

## 2015-09-08 NOTE — Addendum Note (Signed)
Addended by: Aggie Hacker A on: 09/08/2015 11:52 AM   Modules accepted: Orders

## 2015-09-09 ENCOUNTER — Encounter: Payer: Self-pay | Admitting: *Deleted

## 2015-09-09 ENCOUNTER — Other Ambulatory Visit: Payer: Self-pay | Admitting: *Deleted

## 2015-09-09 NOTE — Patient Outreach (Signed)
[place] [facility]) Care Management  09/09/2015  Leyna Vanderkolk [place] 1936-07-11 [id number]   Assessment: Telephone screen - Case closure Unable to receive call back from patient to messages left on mobile number (home phone number - is not a working number). No response received from patient or daughter to Patient Outreach Letter sent on 08/14/15.  Plan: Will close case. Will notify primary care provider of case closure.  Boyde Grieco A. Mackinzie Vuncannon, BSN, RN-BC [place] Management Coordinator Cell: [phone]

## 2015-09-22 ENCOUNTER — Telehealth: Payer: Self-pay | Admitting: Family Medicine

## 2015-09-22 NOTE — Telephone Encounter (Signed)
Pt was seen on 09-08-15 and pt daughter thinks her leg is infected and would like a referral to wound clinic. Can we refer?

## 2015-09-22 NOTE — Telephone Encounter (Signed)
Before we refer er to a wound clinic, we need to try her on antibiotics. Call in Keflex 500 mg TID #30 and Doxycycline 100 mg bid #20. Recheck prn

## 2015-09-23 MED ORDER — CEPHALEXIN 500 MG PO CAPS: 500.0000 mg | ORAL_CAPSULE | Freq: Three times a day (TID) | ORAL | Status: DC

## 2015-09-23 MED ORDER — DOXYCYCLINE HYCLATE 100 MG PO TABS: 100.0000 mg | ORAL_TABLET | Freq: Two times a day (BID) | ORAL | Status: DC

## 2015-09-23 NOTE — Telephone Encounter (Signed)
I sent both scripts e-scribe to [facility], left a voice message with this information for pt, also I will follow up with pt again tomorrow.

## 2015-09-24 NOTE — Patient Outreach (Signed)
[facility] [facility]) Care Management  09/24/2015  Anne Boltz [place] 1936-10-01 [id number]   Notification from [name], RN to close case due to unable to contact patient for [place] Management services.  Thanks, [name]. [place], [place] Assistant Phone: [phone] Fax: [phone]

## 2015-09-24 NOTE — Telephone Encounter (Signed)
I spoke with [name] and went over below information.

## 2015-09-25 ENCOUNTER — Other Ambulatory Visit: Payer: Self-pay | Admitting: Family Medicine

## 2015-09-26 ENCOUNTER — Ambulatory Visit: Payer: Medicare Other | Admitting: Podiatry

## 2015-09-26 NOTE — Telephone Encounter (Signed)
Call in #[phone] with 5 rf 

## 2015-10-01 ENCOUNTER — Encounter: Payer: Self-pay | Admitting: Podiatry

## 2015-10-01 ENCOUNTER — Ambulatory Visit (INDEPENDENT_AMBULATORY_CARE_PROVIDER_SITE_OTHER): Payer: Medicare Other | Admitting: Podiatry

## 2015-10-01 DIAGNOSIS — B351 Tinea unguium: Secondary | ICD-10-CM

## 2015-10-01 DIAGNOSIS — M79676 Pain in unspecified toe(s): Secondary | ICD-10-CM

## 2015-10-01 NOTE — Progress Notes (Signed)
Patient ID: Alexa [name], female   DOB: 06/27/1936, 79 y.o.   MRN: [id number] Complaint:  Visit Type: Patient returns to my office for continued preventative foot care services. Complaint: Patient states" my nails have grown long and thick and become painful to walk and wear shoes" . The patient presents for preventative foot care services. No changes to ROS. Patient diagnosed with peripheral neuropathy.  Podiatric Exam: Vascular: dorsalis pedis and posterior tibial pulses are palpable bilateral. Capillary return is immediate. Temperature gradient is WNL. Skin turgor WNL  Sensorium: Diminished  Semmes Weinstein monofilament test. Normal tactile sensation bilaterally. Nail Exam: Pt has thick disfigured discolored nails with subungual debris noted bilateral entire nail hallux through fifth toenails Ulcer Exam: There is no evidence of ulcer or pre-ulcerative changes or infection. Orthopedic Exam: Muscle tone and strength are WNL. No limitations in general ROM. No crepitus or effusions noted. Foot type and digits show no abnormalities. Bony prominences are unremarkable. Skin: No Porokeratosis. No infection or ulcers  Diagnosis:  Onychomycosis, , Pain in right toe, pain in left toes  Treatment & Plan Procedures and Treatment: Consent by patient was obtained for treatment procedures. The patient understood the discussion of treatment and procedures well. All questions were answered thoroughly reviewed. Debridement of mycotic and hypertrophic toenails, 1 through 5 bilateral and clearing of subungual debris. No ulceration, no infection noted.  Return Visit-Office Procedure: Patient instructed to return to the office for a follow up visit 3 months for continued evaluation and treatment.

## 2015-10-03 ENCOUNTER — Ambulatory Visit: Payer: Medicare Other | Admitting: Family Medicine

## 2015-10-13 ENCOUNTER — Other Ambulatory Visit: Payer: Self-pay | Admitting: Physician Assistant

## 2015-10-15 ENCOUNTER — Encounter: Payer: Self-pay | Admitting: Family Medicine

## 2015-10-15 ENCOUNTER — Ambulatory Visit (INDEPENDENT_AMBULATORY_CARE_PROVIDER_SITE_OTHER): Payer: Medicare Other | Admitting: Family Medicine

## 2015-10-15 VITALS — BP 143/69 | HR 69 | Temp 97.5°F | Wt 162.0 lb

## 2015-10-15 DIAGNOSIS — F0391 Unspecified dementia with behavioral disturbance: Secondary | ICD-10-CM

## 2015-10-15 DIAGNOSIS — I480 Paroxysmal atrial fibrillation: Secondary | ICD-10-CM

## 2015-10-15 DIAGNOSIS — F03918 Unspecified dementia, unspecified severity, with other behavioral disturbance: Secondary | ICD-10-CM

## 2015-10-15 DIAGNOSIS — I10 Essential (primary) hypertension: Secondary | ICD-10-CM

## 2015-10-15 DIAGNOSIS — F411 Generalized anxiety disorder: Secondary | ICD-10-CM | POA: Diagnosis not present

## 2015-10-15 NOTE — Progress Notes (Signed)
Pre visit review using our clinic review tool, if applicable. No additional management support is needed unless otherwise documented below in the visit note. 

## 2015-10-15 NOTE — Progress Notes (Signed)
   Subjective:    Patient ID: Alexa [name], female    DOB: February 23, 1936, 79 y.o.   MRN: [id number]  HPI Here with 2 of her daughters to follow up an injury to the left lower leg, for dementia, and for med refills. The wound is slowly healing in and does not seem to cause her pain. Her dementia is slowly worsening, and they struggle to get her to eat. her BP is stable.   Review of Systems  Constitutional: Negative.   Respiratory: Negative.   Cardiovascular: Negative.   Skin: Positive for wound.       Objective:   Physical Exam  Constitutional: She appears well-developed and well-nourished.  Neck: No thyromegaly present.  Cardiovascular: Normal rate, regular rhythm, normal heart sounds and intact distal pulses.   Pulmonary/Chest: Effort normal and breath sounds normal.  Lymphadenopathy:    She has no cervical adenopathy.  Skin:  The wound on the left lower leg is covered with a large eschar. No evidence of infection, no drainage.           Assessment & Plan:  Her dementia is slowly worsening and the family is debating whether to move her to a memory facility or not. He rHTN is stable. Given a flu shot

## 2015-10-29 ENCOUNTER — Ambulatory Visit (INDEPENDENT_AMBULATORY_CARE_PROVIDER_SITE_OTHER): Payer: Medicare Other | Admitting: Adult Health

## 2015-10-29 ENCOUNTER — Encounter: Payer: Self-pay | Admitting: Adult Health

## 2015-10-29 VITALS — BP 145/82 | HR 90 | Ht 66.0 in | Wt 164.0 lb

## 2015-10-29 DIAGNOSIS — F0390 Unspecified dementia without behavioral disturbance: Secondary | ICD-10-CM | POA: Diagnosis not present

## 2015-10-29 DIAGNOSIS — S81802A Unspecified open wound, left lower leg, initial encounter: Secondary | ICD-10-CM | POA: Diagnosis not present

## 2015-10-29 NOTE — Patient Instructions (Signed)
Stop Aricept. I will refer to Palliative care If your symptoms worsen or you develop new symptoms please let us know.

## 2015-10-29 NOTE — Progress Notes (Signed)
I agree with the assessment and plan as directed by NP .The patient is known to me .   Kennieth Plotts, MD  

## 2015-10-29 NOTE — Progress Notes (Signed)
PATIENT: Alexa [name] DOB: February 14, 1936  REASON FOR VISIT: follow up- memory HISTORY FROM: patient/ daughter  HISTORY OF PRESENT ILLNESS: Alexa [name] is a 79 year old female with a history of progressive dementing illness. She returns today for follow-up. She is currently on Aricept and tolerating it well. In the past the patient has been referred to hospice but she was not eligible due to her ability to ambulate. She lives with her daughters. She requires assistance with all ADLs. She receives 24 hour supervision. Daughter reports that in regards to her appetite she has good and bad days. On occasion she is unable to feed herself. She reports that her sister died last week. Patient does have a wound on the left lower extremity. She states this wound has been there for 2 months. She states that it was healing nicely until the patient removed the scab. Daughter is requesting assistance in the home if possible. She also reports that her mom tends to jerk throughout the day. The jerking normally occurs in the legs or in the shoulders. Daughter does not feel that this is causing any discomfort to the patient. She returns today for an evaluation.   HISTORY 04/29/15: Alexa [name] Dr. Sarajane [name], her PCP,  Alexa [name] is a 79 year old white female with a history of a progressive dementing illness. The patient had a significant decline in her cognitive capacity in the spring of 2015 following a fall associated with head trauma and subarachnoid hemorrhage. The patient also had a DVT, and a pulmonary embolism around that time. The patient has returned to her usual baseline this point. The patient lives with 2 of her daughters. The patient has supervision 24/7. The patient does require some assistance with dressing at times. She is independent with bathing, and she can feed herself. The patient does not wander. At times, the may be some low-grade agitation. The patient is on Aricept, and she is tolerating the  medication well. She returns for routine reevaluation.  04-29-15, the patient's 3 daughters are here with her, and report she shows sign of easy agitation. She mimiks the mood of the surrounding- for better or worse, as so common with brain organic disease. She lost weight. She continues to live with her daughter , ever since 1993, when she became widowed. She is often looking for her girls, mistaken her children for siblings. MMSE and MOCA attached.  She is incontinent , stool soiled her trash can and bedroom. She dresses in 4-5 layers, her daughters are considering a day care for Adults. She actually likes still to go swimming and enjoys.  [facility] [employer] on [place]. Lots of care taker issues to be addressed.   REVIEW OF SYSTEMS: Out of a complete 14 system review of symptoms, the patient complains only of the following symptoms, and all other reviewed systems are negative.  Activity change, chills, hearing loss, runny nose, drooling, blurred vision, restless leg, frequent waking, snoring, sleep talking, back pain, aching muscles, muscle cramps, walking difficulty, confusion, facial drooping, tremors, speech difficulty, memory loss  ALLERGIES: Allergies  Allergen Reactions  . Simvastatin Other (See Comments)    myalgia  . Statins Other (See Comments)    Myalgias with multiple statins   . Strawberry Extract Swelling    Swelling ,rash  . Strawberry Extract Hives  . Hydrocodone-Homatropine Nausea Only    No problems with oxycodone  . Nsaids Other (See Comments)    unknown  . Penicillins Rash    HOME MEDICATIONS: Outpatient  Prescriptions Prior to Visit  Medication Sig Dispense Refill  . amLODipine (NORVASC) 2.5 MG tablet Take 1 tablet (2.5 mg total) by mouth daily. 90 tablet 3  . dicyclomine (BENTYL) 10 MG capsule Take 1 capsule (10 mg total) by mouth 2 (two) times daily as needed for spasms. 60 capsule 0  . docusate sodium (COLACE) 100 MG capsule Take 1 capsule (100 mg  total) by mouth 2 (two) times daily. (Patient taking differently: Take 100 mg by mouth 2 (two) times daily as needed for mild constipation. ) 60 capsule 11  . donepezil (ARICEPT) 10 MG tablet TAKE 1 TABLET BY MOUTH AT BEDTIME 90 tablet 1  . esomeprazole (NEXIUM) 40 MG capsule Take 1 capsule (40 mg total) by mouth daily at 12 noon. 30 capsule 11  . furosemide (LASIX) 40 MG tablet Take 40 mg by mouth daily.     Marland Kitchen levothyroxine (SYNTHROID, LEVOTHROID) 137 MCG tablet TAKE 1 TABLET BY MOUTH EVERY DAY 30 tablet 3  . LORazepam (ATIVAN) 0.5 MG tablet Take 1 tablet (0.5 mg total) by mouth every 8 (eight) hours as needed for anxiety. 60 tablet 2  . oxyCODONE-acetaminophen (PERCOCET) 7.5-325 MG per tablet Take 1 tablet by mouth every 4 (four) hours as needed for moderate pain or severe pain. 30 tablet 0  . potassium chloride SA (K-DUR,KLOR-CON) 20 MEQ tablet Take 20 mEq by mouth daily.    . promethazine (PHENERGAN) 12.5 MG tablet Take 1 tablet (12.5 mg total) by mouth every 6 (six) hours as needed for nausea or vomiting. 30 tablet 0  . risperiDONE (RISPERDAL) 0.25 MG tablet TAKE 1 TABLET BY MOUTH AT BEDTIME 30 tablet 5  . valsartan (DIOVAN) 320 MG tablet Take 1 tablet (320 mg total) by mouth daily. 90 tablet 3   No facility-administered medications prior to visit.    PAST MEDICAL HISTORY: Past Medical History  Diagnosis Date  . Alzheimer disease   . Hyperlipidemia     a. Myalgias with multiple statins.  . Hypertension   . GERD (gastroesophageal reflux disease)   . Diverticulosis   . Hx of adenomatous colonic polyps   . DVT (deep venous thrombosis) ([place]) 09-30-11; 01/2014    RLE; RLE  . Hypothyroidism   . Asthma   . History of tobacco abuse     a. quit 25+ years ago. PFTs in 2/15 were relatively normal.   . Chronic diastolic CHF (congestive heart failure) ([place])     a. ETT-myoview (9/12) with 4' exercise, EF 68%, no ischemia or infarction. b. Echo (10/14) with EF 55-60%, mod diast dysfunction,  normal RV size/fcn, mod pulm HTN.   . Venous insufficiency     a. H/o venous stasis ulceration.   Marland Kitchen PAF (paroxysmal atrial fibrillation) ([place])     a. Only episode that noted was in setting of right upper lobectomy in 2014. She was briefly on amiodarone but stopped it on her own. She is not coumadin. If recurrent AF, will need to consider.  . Pulmonary HTN ([place])     a. Moderate by echo 08/2013.  Marland Kitchen Cerebral infarct ([place])     a.  Remote anterior left frontal lobe infarct by CT head 01/2014.  . PE (pulmonary embolism) 01/2014  . Pneumonia     "couple times maybe" (12/13/2014)  . Arthritis     "qwhere"  . DDD (degenerative disc disease)     a. cervical and lumbar herniation and DJD s/p multiple diskectomies and other back surgeries from 2002-2008   .  Chronic lower back pain   . Alzheimer's dementia     a. sees Dr. Brett Fairy. b. on Aricept.; "stage 6" (12/13/2014)  . Kidney stones   . breast dx'd 2002    left  . Adenocarcinoma of breast ([place])     a. s/p right simple and left modified radical mastectomy with chemotherapy and radiation in 2002.   . Non-small cell lung cancer ([place])     a. s/p right upper lobectomy in 2014.   Marland Kitchen ADENOCARCINOMA, BREAST, BILATERAL, HX OF 02/22/2008    Qualifier: Diagnosis of  By: Niel Hummer MD, [place], RIGHT SHOULDER 12/15/2010    Qualifier: Diagnosis of  By: Niel Hummer MD, Lorinda Creed   . SAH (subarachnoid hemorrhage) ([place]) 03/12/2014  . Ventricular tachycardia ([place]) 02/28/2014    4 beat run ventricular tachycardia in the hospital, February 28, 2014     PAST SURGICAL HISTORY: Past Surgical History  Procedure Laterality Date  . Cholecystectomy  2007  . Lumbar laminectomy  X 3  . Shoulder open rotator cuff repair Right 1995  . Hip surgery    . Abdominal hysterectomy    . Fiberoptic bronchoscopy with electromagnetic  10/07/2010  . Video bronchoscopy N/A 05/14/2013    Procedure: VIDEO BRONCHOSCOPY;  Surgeon: Grace Isaac, MD;  [facility];  Service: Thoracic;  Laterality: N/A;  . Video assisted thoracoscopy (vats)/ lobectomy Right 05/14/2013    Procedure: VIDEO ASSISTED THORACOSCOPY (VATS)/ LOBECTOMY;  Surgeon: Grace Isaac, MD;  Location: [facility];  Service: Thoracic;  Laterality: Right;  . Colonoscopy  02-18-09    per Dr. Fuller Plan, adenomatous polyp, repeat in 5 yrs   . Anterior cervical decomp/discectomy fusion  X 2?  . Varicose vein surgery Right   . Hemorrhoid surgery  1985  . Kidney stone surgery  1987  . Vena cava filter placement  01/2014  . Lobectomy    . Back surgery    . Mastectomy Bilateral 04/2001  . Breast biopsy  2002  . Lithotripsy  X 2?  . Laparoscopic appendectomy N/A 07/25/2015    Procedure: APPENDECTOMY LAPAROSCOPIC;  Surgeon: Jackolyn Confer, MD;  Location: [facility] ORS;  Service: General;  Laterality: N/A;    FAMILY HISTORY: Family History  Problem Relation Age of Onset  . Diabetes Daughter   . Diabetes Daughter   . Heart disease Father   . Heart disease Brother   . Dementia Brother   . Heart disease Sister   . Cancer Sister     breast  . Colon cancer Daughter 6  . Colon polyps Daughter   . Stroke Mother   . Liver disease Daughter   . Heart attack    . Cancer Sister     breast    SOCIAL HISTORY: Social History   Social History  . Marital Status: Widowed    Spouse Name: N/A  . Number of Children: 3  . Years of Education: 11   Occupational History  .     Social History Main Topics  . Smoking status: Former Smoker -- 1.00 packs/day for 25 years    Types: Cigarettes    Start date: 06/04/1959    Quit date: 05/10/1984  . Smokeless tobacco: Never Used  . Alcohol Use: No  . Drug Use: No  . Sexual Activity: No   Other Topics Concern  . Not on file   Social History Narrative   ** Merged History Encounter **  Patient is widowed. Patient is right-handed. Patient is retired. Patient has an 11th grade education. Patient has three daughters.      PHYSICAL EXAM  Filed  Vitals:   10/29/15 1254  BP: 145/82  Pulse: 90  Height: '5\' 6"'$  (1.676 m)  Weight: 164 lb (74.39 kg)   Body mass index is 26.48 kg/(m^2).  Generalized: Well developed, in no acute distress  Skin: wound left lower extremity- anterior- 2.5 inches X 2.5 Inches- wound bed pink and white.   Neurological examination  Mentation: Alert oriented to time, place, history taking. Follows all commands speech and language fluent Cranial nerve II-XII:  Extraocular movements were full, visual field were full on confrontational test. Facial sensation and strength were normal.  Head turning and shoulder shrug  were normal and symmetric. Motor: The motor testing reveals 5 over 5 strength of all 4 extremities.   Sensory: Sensory testing is intact to soft touch on all 4 extremities.  Coordination: unable to complete due to comprehension Gait and station: Gait is slightly unsteady.  Reflexes: Deep tendon reflexes are symmetric and normal bilaterally.   DIAGNOSTIC DATA (LABS, IMAGING, TESTING) - I reviewed patient records, labs, notes, testing and imaging myself where available.  Lab Results  Component Value Date   WBC 5.2 08/15/2015   HGB 13.4 08/15/2015   HCT 40.2 08/15/2015   MCV 87 08/15/2015   PLT 253 08/15/2015         ASSESSMENT AND PLAN 79 y.o. year old female  has a past medical history of Alzheimer disease; Hyperlipidemia; Hypertension; GERD (gastroesophageal reflux disease); Diverticulosis; adenomatous colonic polyps; DVT (deep venous thrombosis) ([place]) (09-30-11; 01/2014); Hypothyroidism; Asthma; History of tobacco abuse; Chronic diastolic CHF (congestive heart failure) ([place]); Venous insufficiency; PAF (paroxysmal atrial fibrillation) ([place]); Pulmonary HTN ([place]); Cerebral infarct [facility]); PE (pulmonary embolism) (01/2014); Pneumonia; Arthritis; DDD (degenerative disc disease); Chronic lower back pain; Alzheimer's dementia; Kidney stones; breast (dx'd 2002); Adenocarcinoma of breast ([place]);  Non-small cell lung cancer ([place]); ADENOCARCINOMA, BREAST, BILATERAL, HX OF (02/22/2008); ROTATOR CUFF INJURY, RIGHT SHOULDER (12/15/2010); SAH (subarachnoid hemorrhage) ([facility]) (03/12/2014); and Ventricular tachycardia ([place]) (02/28/2014). here with:  1. Dementia 2. Wound left lower extremity  The patient was unable to complete the MMSE today. At this point I'm unsure that Aricept is offering i any benefit to the patient. The daughter is amenable to  discontinuing this medication. I explained to the daughter that myoclonic jerking is common in end-stage dementia. The patient also has a wound on the left lower leg. We did place a bandage on the wound during the office visit today since it was open and draining. I advised that she should follow up with wound specialist. I will also make a referral to palliative care. I do feel that the patient would benefit from this. Patient and daughter are advised that if her symptoms worsen or she develops new symptoms that should let us know. She will follow-up in 4-5 months or sooner if needed.     Ward Givens, MSN, NP-C 10/29/2015, 12:59 PM [facility] [address] [place], [place] [address] [phone]

## 2015-11-04 ENCOUNTER — Telehealth: Payer: Self-pay | Admitting: Family Medicine

## 2015-11-04 NOTE — Telephone Encounter (Signed)
Josh call from Palliative Care and would like a call back about the pt insomnia  [phone]

## 2015-11-05 NOTE — Telephone Encounter (Signed)
I spoke with Josh and pt is still at home. She has a problem falling asleep and staying asleep. Can you recommend anything to help with this?

## 2015-11-05 NOTE — Telephone Encounter (Signed)
At one point earlier this year she was taking 0.25 mg of Respiridone along with Temazepam 30 mg at bedtime. If she is out of Temazepam, refill this for another 6 months

## 2015-11-05 NOTE — Telephone Encounter (Signed)
Josh from [facility] called on behalf the family requesting medication for insomnia.

## 2015-11-06 NOTE — Telephone Encounter (Signed)
I left a voice message for daughter [name] to return my call.

## 2015-11-07 NOTE — Addendum Note (Signed)
Addended by: Aggie Hacker A on: 11/07/2015 04:54 PM   Modules accepted: Medications

## 2015-11-07 NOTE — Telephone Encounter (Signed)
I spoke with Alexa [name] and went over the below information, also updating medication list with the dose increase.

## 2015-11-07 NOTE — Telephone Encounter (Signed)
I do not prescribe Seroquel because of side effects. Instead have er stay on Temazepam but increase the Respiridone until it becomes effective. Give her 2 respiridone tabs at bedtime for a night or two. If not effective try 3 tabs, then report back to [place]

## 2015-11-07 NOTE — Telephone Encounter (Signed)
I spoke with Alexa [name] and she says that pt is not sleeping at all, wants to know if pt can try something like Seroquel to replace the Temazepam? ( [facility] on [address] )

## 2015-11-09 ENCOUNTER — Other Ambulatory Visit: Payer: Self-pay | Admitting: Family Medicine

## 2015-11-11 NOTE — Telephone Encounter (Signed)
Per Dr. Sarajane Jews okay to refill for 6 months.

## 2015-11-18 ENCOUNTER — Encounter: Payer: Self-pay | Admitting: Family Medicine

## 2015-11-18 ENCOUNTER — Ambulatory Visit (INDEPENDENT_AMBULATORY_CARE_PROVIDER_SITE_OTHER): Payer: Medicare Other | Admitting: Family Medicine

## 2015-11-18 VITALS — BP 134/78 | HR 84 | Temp 98.2°F

## 2015-11-18 DIAGNOSIS — L03116 Cellulitis of left lower limb: Secondary | ICD-10-CM

## 2015-11-18 MED ORDER — SILVER SULFADIAZINE 1 % EX CREA: 1.0000 "application " | TOPICAL_CREAM | Freq: Every day | CUTANEOUS | Status: AC

## 2015-11-18 MED ORDER — CEPHALEXIN 250 MG/5ML PO SUSR: 500.0000 mg | Freq: Four times a day (QID) | ORAL | Status: DC

## 2015-11-18 NOTE — Progress Notes (Signed)
   Subjective:    Patient ID: Alexa [name], female    DOB: [birth year], 79 y.o.   MRN: [id number]  HPI Here to recheck a wound on the left lower leg that was the result of a fall on 09-06-15, when she struck the leg on a concrete step. It had been slowly healing, but over the last week it has begun to look infected. The leg is red and warm, and the wound has a foul odor. The drainage has turned to a green or brown color. No fevers.    Review of Systems  Constitutional: Negative.   Skin: Positive for wound.       Objective:   Physical Exam  Constitutional:  In a wheelchair, confused   Musculoskeletal:  Left lower leg is swollen, red, warm, and tender b  Skin:  The left lower leg has a superficial ulcerated area about 3 cm in diameter. The base of this wound has some pink granulation tissue coming in. The wound has a foul odor          Assessment & Plan:  Cellulitis from an injury. Treat with Keflex for 10 days, and dress with Silvadene cream daily.

## 2015-11-18 NOTE — Progress Notes (Signed)
Pre visit review using our clinic review tool, if applicable. No additional management support is needed unless otherwise documented below in the visit note. 

## 2015-11-19 ENCOUNTER — Emergency Department (HOSPITAL_COMMUNITY): Payer: Medicare Other

## 2015-11-19 ENCOUNTER — Inpatient Hospital Stay (HOSPITAL_COMMUNITY)
Admission: EM | Admit: 2015-11-19 | Discharge: 2015-11-24 | DRG: 602 | Disposition: A | Discharge status: Hospice | Payer: Medicare Other | Attending: Internal Medicine | Admitting: Internal Medicine

## 2015-11-19 ENCOUNTER — Encounter (HOSPITAL_COMMUNITY): Payer: Self-pay | Admitting: Emergency Medicine

## 2015-11-19 DIAGNOSIS — E039 Hypothyroidism, unspecified: Secondary | ICD-10-CM | POA: Diagnosis present

## 2015-11-19 DIAGNOSIS — Z923 Personal history of irradiation: Secondary | ICD-10-CM

## 2015-11-19 DIAGNOSIS — Z79891 Long term (current) use of opiate analgesic: Secondary | ICD-10-CM

## 2015-11-19 DIAGNOSIS — N179 Acute kidney failure, unspecified: Secondary | ICD-10-CM | POA: Diagnosis present

## 2015-11-19 DIAGNOSIS — Z9013 Acquired absence of bilateral breasts and nipples: Secondary | ICD-10-CM

## 2015-11-19 DIAGNOSIS — Z880 Allergy status to penicillin: Secondary | ICD-10-CM

## 2015-11-19 DIAGNOSIS — F0391 Unspecified dementia with behavioral disturbance: Secondary | ICD-10-CM | POA: Diagnosis present

## 2015-11-19 DIAGNOSIS — Z86711 Personal history of pulmonary embolism: Secondary | ICD-10-CM

## 2015-11-19 DIAGNOSIS — L03116 Cellulitis of left lower limb: Secondary | ICD-10-CM | POA: Diagnosis not present

## 2015-11-19 DIAGNOSIS — Z9221 Personal history of antineoplastic chemotherapy: Secondary | ICD-10-CM

## 2015-11-19 DIAGNOSIS — G309 Alzheimer's disease, unspecified: Secondary | ICD-10-CM | POA: Diagnosis present

## 2015-11-19 DIAGNOSIS — L89159 Pressure ulcer of sacral region, unspecified stage: Secondary | ICD-10-CM | POA: Diagnosis present

## 2015-11-19 DIAGNOSIS — Z85118 Personal history of other malignant neoplasm of bronchus and lung: Secondary | ICD-10-CM

## 2015-11-19 DIAGNOSIS — Z515 Encounter for palliative care: Secondary | ICD-10-CM | POA: Diagnosis present

## 2015-11-19 DIAGNOSIS — F05 Delirium due to known physiological condition: Secondary | ICD-10-CM | POA: Diagnosis present

## 2015-11-19 DIAGNOSIS — Z7189 Other specified counseling: Secondary | ICD-10-CM | POA: Insufficient documentation

## 2015-11-19 DIAGNOSIS — F03918 Unspecified dementia, unspecified severity, with other behavioral disturbance: Secondary | ICD-10-CM | POA: Diagnosis present

## 2015-11-19 DIAGNOSIS — Z833 Family history of diabetes mellitus: Secondary | ICD-10-CM

## 2015-11-19 DIAGNOSIS — Z902 Acquired absence of lung [part of]: Secondary | ICD-10-CM

## 2015-11-19 DIAGNOSIS — G9340 Encephalopathy, unspecified: Secondary | ICD-10-CM | POA: Diagnosis present

## 2015-11-19 DIAGNOSIS — Z800 Family history of malignant neoplasm of digestive organs: Secondary | ICD-10-CM

## 2015-11-19 DIAGNOSIS — K219 Gastro-esophageal reflux disease without esophagitis: Secondary | ICD-10-CM | POA: Diagnosis present

## 2015-11-19 DIAGNOSIS — G8929 Other chronic pain: Secondary | ICD-10-CM | POA: Diagnosis present

## 2015-11-19 DIAGNOSIS — L8990 Pressure ulcer of unspecified site, unspecified stage: Secondary | ICD-10-CM | POA: Insufficient documentation

## 2015-11-19 DIAGNOSIS — Z823 Family history of stroke: Secondary | ICD-10-CM

## 2015-11-19 DIAGNOSIS — F0281 Dementia in other diseases classified elsewhere with behavioral disturbance: Secondary | ICD-10-CM | POA: Diagnosis present

## 2015-11-19 DIAGNOSIS — Z91018 Allergy to other foods: Secondary | ICD-10-CM

## 2015-11-19 DIAGNOSIS — L89153 Pressure ulcer of sacral region, stage 3: Secondary | ICD-10-CM | POA: Diagnosis present

## 2015-11-19 DIAGNOSIS — Z66 Do not resuscitate: Secondary | ICD-10-CM | POA: Diagnosis present

## 2015-11-19 DIAGNOSIS — F03C0 Unspecified dementia, severe, without behavioral disturbance, psychotic disturbance, mood disturbance, and anxiety: Secondary | ICD-10-CM | POA: Insufficient documentation

## 2015-11-19 DIAGNOSIS — Q825 Congenital non-neoplastic nevus: Secondary | ICD-10-CM

## 2015-11-19 DIAGNOSIS — Z886 Allergy status to analgesic agent status: Secondary | ICD-10-CM

## 2015-11-19 DIAGNOSIS — I10 Essential (primary) hypertension: Secondary | ICD-10-CM | POA: Diagnosis present

## 2015-11-19 DIAGNOSIS — I872 Venous insufficiency (chronic) (peripheral): Secondary | ICD-10-CM | POA: Diagnosis present

## 2015-11-19 DIAGNOSIS — Z9181 History of falling: Secondary | ICD-10-CM

## 2015-11-19 DIAGNOSIS — E860 Dehydration: Secondary | ICD-10-CM | POA: Diagnosis present

## 2015-11-19 DIAGNOSIS — Z8249 Family history of ischemic heart disease and other diseases of the circulatory system: Secondary | ICD-10-CM

## 2015-11-19 DIAGNOSIS — E785 Hyperlipidemia, unspecified: Secondary | ICD-10-CM | POA: Diagnosis present

## 2015-11-19 DIAGNOSIS — R296 Repeated falls: Secondary | ICD-10-CM | POA: Diagnosis present

## 2015-11-19 DIAGNOSIS — Z853 Personal history of malignant neoplasm of breast: Secondary | ICD-10-CM

## 2015-11-19 DIAGNOSIS — Z885 Allergy status to narcotic agent status: Secondary | ICD-10-CM

## 2015-11-19 DIAGNOSIS — F0390 Unspecified dementia without behavioral disturbance: Secondary | ICD-10-CM | POA: Insufficient documentation

## 2015-11-19 DIAGNOSIS — Z888 Allergy status to other drugs, medicaments and biological substances status: Secondary | ICD-10-CM

## 2015-11-19 DIAGNOSIS — S81802A Unspecified open wound, left lower leg, initial encounter: Secondary | ICD-10-CM

## 2015-11-19 DIAGNOSIS — I272 Other secondary pulmonary hypertension: Secondary | ICD-10-CM | POA: Diagnosis present

## 2015-11-19 DIAGNOSIS — I5032 Chronic diastolic (congestive) heart failure: Secondary | ICD-10-CM | POA: Diagnosis present

## 2015-11-19 DIAGNOSIS — Z87891 Personal history of nicotine dependence: Secondary | ICD-10-CM

## 2015-11-19 DIAGNOSIS — I110 Hypertensive heart disease with heart failure: Secondary | ICD-10-CM | POA: Diagnosis present

## 2015-11-19 DIAGNOSIS — Z8673 Personal history of transient ischemic attack (TIA), and cerebral infarction without residual deficits: Secondary | ICD-10-CM

## 2015-11-19 DIAGNOSIS — R4182 Altered mental status, unspecified: Secondary | ICD-10-CM

## 2015-11-19 LAB — COMPREHENSIVE METABOLIC PANEL
ALBUMIN: 2.8 g/dL — AB (ref 3.5–5.0)
ALT: 19 U/L (ref 14–54)
ANION GAP: 11 (ref 5–15)
AST: 22 U/L (ref 15–41)
Alkaline Phosphatase: 98 U/L (ref 38–126)
BUN: 22 mg/dL — ABNORMAL HIGH (ref 6–20)
CALCIUM: 8.7 mg/dL — AB (ref 8.9–10.3)
CO2: 27 mmol/L (ref 22–32)
Chloride: 106 mmol/L (ref 101–111)
Creatinine, Ser: 1.27 mg/dL — ABNORMAL HIGH (ref 0.44–1.00)
GFR calc non Af Amer: 40 mL/min — ABNORMAL LOW (ref >60)
GFR, EST AFRICAN AMERICAN: 46 mL/min — AB (ref >60)
GLUCOSE: 154 mg/dL — AB (ref 65–99)
POTASSIUM: 3.9 mmol/L (ref 3.5–5.1)
SODIUM: 144 mmol/L (ref 135–145)
Total Bilirubin: 0.7 mg/dL (ref 0.3–1.2)
Total Protein: 6.3 g/dL — ABNORMAL LOW (ref 6.5–8.1)

## 2015-11-19 LAB — URINALYSIS, ROUTINE W REFLEX MICROSCOPIC
Glucose, UA: NEGATIVE mg/dL
HGB URINE DIPSTICK: NEGATIVE
Ketones, ur: NEGATIVE mg/dL
LEUKOCYTES UA: NEGATIVE
Nitrite: NEGATIVE
PROTEIN: 30 mg/dL — AB
SPECIFIC GRAVITY, URINE: 1.025 (ref 1.005–1.030)
pH: 5.5 (ref 5.0–8.0)

## 2015-11-19 LAB — URINE MICROSCOPIC-ADD ON
RBC / HPF: NONE SEEN RBC/hpf (ref 0–5)
WBC, UA: NONE SEEN WBC/hpf (ref 0–5)

## 2015-11-19 LAB — CBC
HEMATOCRIT: 35.3 % — AB (ref 36.0–46.0)
HEMOGLOBIN: 11.7 g/dL — AB (ref 12.0–15.0)
MCH: 29.1 pg (ref 26.0–34.0)
MCHC: 33.1 g/dL (ref 30.0–36.0)
MCV: 87.8 fL (ref 78.0–100.0)
Platelets: 253 10*3/uL (ref 150–400)
RBC: 4.02 MIL/uL (ref 3.87–5.11)
RDW: 13.9 % (ref 11.5–15.5)
WBC: 8.3 10*3/uL (ref 4.0–10.5)

## 2015-11-19 LAB — I-STAT CG4 LACTIC ACID, ED: LACTIC ACID, VENOUS: 0.92 mmol/L (ref 0.5–2.0)

## 2015-11-19 NOTE — ED Notes (Signed)
EDP at bedside  

## 2015-11-19 NOTE — ED Notes (Signed)
Pt to CT

## 2015-11-19 NOTE — ED Notes (Signed)
Pt in EMS from home, has had 3 falls in past few days, last fall was yesterday. Family then noticed today pt has been more lethargic than usual. Pt does have hx dementia but family says pt is altered from her baseline.

## 2015-11-19 NOTE — ED Notes (Signed)
Pt to xray

## 2015-11-19 NOTE — ED Notes (Signed)
Pt back from CT

## 2015-11-19 NOTE — ED Notes (Signed)
Pt also has infected skin tear on leg and has been on/off abx for past several weeks.

## 2015-11-19 NOTE — ED Provider Notes (Signed)
CSN: [id number]     Arrival date & time 11/19/15  2135 History   First MD Initiated Contact with Patient 11/19/15 2157     Chief Complaint  Patient presents with  . Altered Mental Status     (Consider location/radiation/quality/duration/timing/severity/associated sxs/prior Treatment) Patient is a 79 y.o. female presenting with altered mental status. The history is provided by a relative.  Altered Mental Status Presenting symptoms: behavior changes and lethargy   Severity:  Severe Most recent episode:  Yesterday Episode history:  Single Duration:  1 day Timing:  Constant Progression:  Worsening Chronicity:  New Context: dementia and recent infection   Context: not a recent illness   Associated symptoms: agitation and weakness   Pt has rapidly progressing dementia.   Pt has had multiple fall.   Pt has a wound on her leg that will not heal.  Leg has had increasing redness and drainage.  Pt sleepy today,  Not as responsive as usual.  Pt lives at home with 3 daughters.  She has a DNR paper with her.  Past Medical History  Diagnosis Date  . Alzheimer disease   . Hyperlipidemia     a. Myalgias with multiple statins.  . Hypertension   . GERD (gastroesophageal reflux disease)   . Diverticulosis   . Hx of adenomatous colonic polyps   . DVT (deep venous thrombosis) ([place]) 09-30-11; 01/2014    RLE; RLE  . Hypothyroidism   . Asthma   . History of tobacco abuse     a. quit 25+ years ago. PFTs in 2/15 were relatively normal.   . Chronic diastolic CHF (congestive heart failure) ([place])     a. ETT-myoview (9/12) with 4' exercise, EF 68%, no ischemia or infarction. b. Echo (10/14) with EF 55-60%, mod diast dysfunction, normal RV size/fcn, mod pulm HTN.   . Venous insufficiency     a. H/o venous stasis ulceration.   Marland Kitchen PAF (paroxysmal atrial fibrillation) ([place])     a. Only episode that noted was in setting of right upper lobectomy in 2014. She was briefly on amiodarone but stopped it on her own.  She is not coumadin. If recurrent AF, will need to consider.  . Pulmonary HTN ([place])     a. Moderate by echo 08/2013.  Marland Kitchen Cerebral infarct ([place])     a.  Remote anterior left frontal lobe infarct by CT head 01/2014.  . PE (pulmonary embolism) 01/2014  . Pneumonia     "couple times maybe" (12/13/2014)  . Arthritis     "qwhere"  . DDD (degenerative disc disease)     a. cervical and lumbar herniation and DJD s/p multiple diskectomies and other back surgeries from 2002-2008   . Chronic lower back pain   . Alzheimer's dementia     a. sees Dr. Brett Fairy. b. on Aricept.; "stage 6" (12/13/2014)  . Kidney stones   . breast dx'd 2002    left  . Adenocarcinoma of breast ([place])     a. s/p right simple and left modified radical mastectomy with chemotherapy and radiation in 2002.   . Non-small cell lung cancer ([place])     a. s/p right upper lobectomy in 2014.   Marland Kitchen ADENOCARCINOMA, BREAST, BILATERAL, HX OF 02/22/2008    Qualifier: Diagnosis of  By: Niel Hummer MD, [place], RIGHT SHOULDER 12/15/2010    Qualifier: Diagnosis of  By: Niel Hummer MD, Lorinda Creed   . SAH (subarachnoid hemorrhage) ([place]) 03/12/2014  .  Ventricular tachycardia ([place]) 02/28/2014    4 beat run ventricular tachycardia in the hospital, February 28, 2014    Past Surgical History  Procedure Laterality Date  . Cholecystectomy  2007  . Lumbar laminectomy  X 3  . Shoulder open rotator cuff repair Right 1995  . Hip surgery    . Abdominal hysterectomy    . Fiberoptic bronchoscopy with electromagnetic  10/07/2010  . Video bronchoscopy N/A 05/14/2013    Procedure: VIDEO BRONCHOSCOPY;  Surgeon: Grace Isaac, MD;  Location: [facility] OR;  Service: Thoracic;  Laterality: N/A;  . Video assisted thoracoscopy (vats)/ lobectomy Right 05/14/2013    Procedure: VIDEO ASSISTED THORACOSCOPY (VATS)/ LOBECTOMY;  Surgeon: Grace Isaac, MD;  Location: [facility];  Service: Thoracic;  Laterality: Right;  . Colonoscopy  02-18-09    per Dr. Fuller Plan,  adenomatous polyp, repeat in 5 yrs   . Anterior cervical decomp/discectomy fusion  X 2?  . Varicose vein surgery Right   . Hemorrhoid surgery  1985  . Kidney stone surgery  1987  . Vena cava filter placement  01/2014  . Lobectomy    . Back surgery    . Mastectomy Bilateral 04/2001  . Breast biopsy  2002  . Lithotripsy  X 2?  . Laparoscopic appendectomy N/A 07/25/2015    Procedure: APPENDECTOMY LAPAROSCOPIC;  Surgeon: Jackolyn Confer, MD;  Location: [facility] ORS;  Service: General;  Laterality: N/A;   Family History  Problem Relation Age of Onset  . Diabetes Daughter   . Diabetes Daughter   . Heart disease Father   . Heart disease Brother   . Dementia Brother   . Heart disease Sister   . Cancer Sister     breast  . Colon cancer Daughter 12  . Colon polyps Daughter   . Stroke Mother   . Liver disease Daughter   . Heart attack    . Cancer Sister     breast   Social History  Substance Use Topics  . Smoking status: Former Smoker -- 1.00 packs/day for 25 years    Types: Cigarettes    Start date: 06/04/1959    Quit date: 05/10/1984  . Smokeless tobacco: Never Used  . Alcohol Use: No   OB History    Gravida Para Term Preterm AB TAB SAB Ectopic Multiple Living   0 0 0 0 0 0 0 0       Review of Systems  Neurological: Positive for weakness.  Psychiatric/Behavioral: Positive for agitation.  All other systems reviewed and are negative.     Allergies  Simvastatin; Statins; Strawberry extract; Strawberry extract; Hydrocodone-homatropine; Nsaids; and Penicillins  Home Medications   Prior to Admission medications   Medication Sig Start Date End Date Taking? Authorizing Provider  amLODipine (NORVASC) 2.5 MG tablet Take 1 tablet (2.5 mg total) by mouth daily. 08/29/15  Yes Scott T Kathlen Mody, PA-C  cephALEXin (KEFLEX) 250 MG/5ML suspension Take 10 mLs (500 mg total) by mouth 4 (four) times daily. 11/18/15  Yes Laurey Morale, MD  dicyclomine (BENTYL) 10 MG capsule Take 1 capsule (10 mg  total) by mouth 2 (two) times daily as needed for spasms. 10/15/14  Yes Willia Craze, NP  docusate sodium (COLACE) 100 MG capsule Take 1 capsule (100 mg total) by mouth 2 (two) times daily. Patient taking differently: Take 100 mg by mouth 2 (two) times daily as needed for mild constipation.  03/21/15  Yes Laurey Morale, MD  esomeprazole (NEXIUM) 40 MG capsule Take 1 capsule (  40 mg total) by mouth daily at 12 noon. 03/21/15  Yes Laurey Morale, MD  furosemide (LASIX) 40 MG tablet Take 40 mg by mouth daily.    Yes Historical Provider, MD  levothyroxine (SYNTHROID, LEVOTHROID) 137 MCG tablet TAKE 1 TABLET BY MOUTH EVERY DAY 08/22/15  Yes Laurey Morale, MD  LORazepam (ATIVAN) 0.5 MG tablet Take 1 tablet (0.5 mg total) by mouth every 8 (eight) hours as needed for anxiety. 09/08/15  Yes Laurey Morale, MD  morphine (MS CONTIN) 15 MG 12 hr tablet Take 1 tablet by mouth every 12 (twelve) hours. 09/24/15  Yes Historical Provider, MD  oxyCODONE-acetaminophen (PERCOCET) 7.5-325 MG per tablet Take 1 tablet by mouth every 4 (four) hours as needed for moderate pain or severe pain. 07/26/15  Yes Michael Boston, MD  potassium chloride SA (K-DUR,KLOR-CON) 20 MEQ tablet Take 20 mEq by mouth daily. 02/13/14  Yes Larey Dresser, MD  risperiDONE (RISPERDAL) 0.25 MG tablet TAKE 1 TABLET BY MOUTH AT BEDTIME Patient taking differently: TAKE 2 TABLETs BY MOUTH AT BEDTIME 09/29/15  Yes Laurey Morale, MD  silver sulfADIAZINE (SILVADENE) 1 % cream Apply 1 application topically daily. 11/18/15  Yes Laurey Morale, MD  temazepam (RESTORIL) 30 MG capsule TAKE ONE CAPSULE BY MOUTH AT BEDTIME AS NEEDED Patient taking differently: TAKE ONE CAPSULE BY MOUTH AT BEDTIME AS NEEDED FOR SLEEP. 11/11/15  Yes Laurey Morale, MD  valsartan (DIOVAN) 320 MG tablet Take 1 tablet (320 mg total) by mouth daily. 08/29/15  Yes Scott T Weaver, PA-C   BP 139/57 mmHg  Pulse 105  Temp(Src) 100.2 F (37.9 C) (Rectal)  Resp 14  Ht '5\' 5"'$  (1.651 m)  Wt  63.504 kg  BMI 23.30 kg/m2  SpO2 100% Physical Exam  Constitutional: She is oriented to person, place, and time. She appears well-developed and well-nourished.  HENT:  Head: Normocephalic and atraumatic.  Mouth/Throat: Oropharynx is clear and moist.  Eyes: EOM are normal. Pupils are equal, round, and reactive to light.  Neck: Normal range of motion.  Cardiovascular: Normal rate.   Pulmonary/Chest: Effort normal.  Abdominal: Soft. She exhibits no distension.  Musculoskeletal: She exhibits tenderness.  10 cm wound left lower leg,  Erythema full left lower leg  Neurological: She is alert and oriented to person, place, and time.  Skin: Skin is warm.  Psychiatric:  Responds to pain.  Opens eyes to questions but does not answer.   Nursing note and vitals reviewed.   ED Course  Procedures (including critical care time) Labs Review Labs Reviewed  COMPREHENSIVE METABOLIC PANEL - Abnormal; Notable for the following:    Glucose, Bld 154 (*)    BUN 22 (*)    Creatinine, Ser 1.27 (*)    Calcium 8.7 (*)    Total Protein 6.3 (*)    Albumin 2.8 (*)    GFR calc non Af Amer 40 (*)    GFR calc Af Amer 46 (*)    All other components within normal limits  CBC - Abnormal; Notable for the following:    Hemoglobin 11.7 (*)    HCT 35.3 (*)    All other components within normal limits  CULTURE, BLOOD (ROUTINE X 2)  CULTURE, BLOOD (ROUTINE X 2)  URINALYSIS, ROUTINE W REFLEX MICROSCOPIC (NOT AT [facility])  I-STAT CG4 LACTIC ACID, ED  CBG MONITORING, ED    Imaging Review Dg Tibia/fibula Left  11/19/2015  CLINICAL DATA:  Fall with left lower leg wound. EXAM: LEFT TIBIA  AND FIBULA - 2 VIEW COMPARISON:  None. FINDINGS: Skin irregularity and possible soft tissue gas anteriorly at the lower shin, correlating with history of wound in this location. There is no evidence of tracking subcutaneous gas or opaque foreign body. Subcutaneous fat reticulation throughout the leg, potential cellulitis. No evidence of  osseous infection or fracture. Knee osteoarthritis, mild for age, with chondrocalcinosis. IMPRESSION: Soft tissue swelling without acute osseous abnormality, as above. Electronically Signed   By: Monte Fantasia M.D.   On: 11/19/2015 23:29   I have personally reviewed and evaluated these images and lab results as part of my medical decision-making.   EKG Interpretation   Date/Time:  Wednesday November 19 2015 21:45:30 EST Ventricular Rate:  113 PR Interval:  132 QRS Duration: 110 QT Interval:  317 QTC Calculation: 435 R Axis:   -20 Text Interpretation:  Interpretation limited secondary to artifact rhythm  regular  pvc  Present Confirmed by KNOTT MD, Quillian Quince ([id number]) on 11/19/2015  9:51:15 PM      MDM  Daughter wants Iv antibiotic treatment.   Daughter wants pt to be DNR but wants her to get better/infection treatment.  On my reevaluation. Pt is more alert.  She ask for Popcorn.   Daughter reports this is the only understandable thing pt has said today.   Final diagnoses:  Cellulitis of left lower leg  Wound of left leg, initial encounter  Altered mental status, unspecified altered mental status type  Severe dementia    Hospitalist consult for admission.  Pt given vancomycin Iv for infection.   Dr. Delford Field will admit.    [place], PA-C 11/20/15 0036  Leo Grosser, MD 11/20/15 [phone]

## 2015-11-20 ENCOUNTER — Telehealth: Payer: Self-pay | Admitting: *Deleted

## 2015-11-20 ENCOUNTER — Encounter (HOSPITAL_COMMUNITY): Payer: Self-pay | Admitting: Internal Medicine

## 2015-11-20 DIAGNOSIS — G9340 Encephalopathy, unspecified: Secondary | ICD-10-CM | POA: Diagnosis not present

## 2015-11-20 DIAGNOSIS — E039 Hypothyroidism, unspecified: Secondary | ICD-10-CM | POA: Diagnosis present

## 2015-11-20 DIAGNOSIS — Z8673 Personal history of transient ischemic attack (TIA), and cerebral infarction without residual deficits: Secondary | ICD-10-CM | POA: Diagnosis not present

## 2015-11-20 DIAGNOSIS — L03116 Cellulitis of left lower limb: Secondary | ICD-10-CM | POA: Diagnosis not present

## 2015-11-20 DIAGNOSIS — Z886 Allergy status to analgesic agent status: Secondary | ICD-10-CM | POA: Diagnosis not present

## 2015-11-20 DIAGNOSIS — I272 Other secondary pulmonary hypertension: Secondary | ICD-10-CM | POA: Diagnosis present

## 2015-11-20 DIAGNOSIS — Z85118 Personal history of other malignant neoplasm of bronchus and lung: Secondary | ICD-10-CM | POA: Diagnosis not present

## 2015-11-20 DIAGNOSIS — L89159 Pressure ulcer of sacral region, unspecified stage: Secondary | ICD-10-CM | POA: Diagnosis present

## 2015-11-20 DIAGNOSIS — I872 Venous insufficiency (chronic) (peripheral): Secondary | ICD-10-CM | POA: Diagnosis present

## 2015-11-20 DIAGNOSIS — Z902 Acquired absence of lung [part of]: Secondary | ICD-10-CM | POA: Diagnosis not present

## 2015-11-20 DIAGNOSIS — Z9013 Acquired absence of bilateral breasts and nipples: Secondary | ICD-10-CM | POA: Diagnosis not present

## 2015-11-20 DIAGNOSIS — Z7189 Other specified counseling: Secondary | ICD-10-CM

## 2015-11-20 DIAGNOSIS — I110 Hypertensive heart disease with heart failure: Secondary | ICD-10-CM | POA: Diagnosis present

## 2015-11-20 DIAGNOSIS — Z823 Family history of stroke: Secondary | ICD-10-CM | POA: Diagnosis not present

## 2015-11-20 DIAGNOSIS — F0281 Dementia in other diseases classified elsewhere with behavioral disturbance: Secondary | ICD-10-CM | POA: Diagnosis present

## 2015-11-20 DIAGNOSIS — N179 Acute kidney failure, unspecified: Secondary | ICD-10-CM | POA: Diagnosis present

## 2015-11-20 DIAGNOSIS — Z9181 History of falling: Secondary | ICD-10-CM | POA: Diagnosis not present

## 2015-11-20 DIAGNOSIS — Z66 Do not resuscitate: Secondary | ICD-10-CM | POA: Diagnosis present

## 2015-11-20 DIAGNOSIS — L89153 Pressure ulcer of sacral region, stage 3: Secondary | ICD-10-CM | POA: Diagnosis present

## 2015-11-20 DIAGNOSIS — Z888 Allergy status to other drugs, medicaments and biological substances status: Secondary | ICD-10-CM | POA: Diagnosis not present

## 2015-11-20 DIAGNOSIS — G309 Alzheimer's disease, unspecified: Secondary | ICD-10-CM | POA: Diagnosis present

## 2015-11-20 DIAGNOSIS — Z885 Allergy status to narcotic agent status: Secondary | ICD-10-CM | POA: Diagnosis not present

## 2015-11-20 DIAGNOSIS — R296 Repeated falls: Secondary | ICD-10-CM | POA: Diagnosis present

## 2015-11-20 DIAGNOSIS — F05 Delirium due to known physiological condition: Secondary | ICD-10-CM | POA: Diagnosis present

## 2015-11-20 DIAGNOSIS — G8929 Other chronic pain: Secondary | ICD-10-CM | POA: Diagnosis present

## 2015-11-20 DIAGNOSIS — Z87891 Personal history of nicotine dependence: Secondary | ICD-10-CM | POA: Diagnosis not present

## 2015-11-20 DIAGNOSIS — K219 Gastro-esophageal reflux disease without esophagitis: Secondary | ICD-10-CM | POA: Diagnosis present

## 2015-11-20 DIAGNOSIS — E860 Dehydration: Secondary | ICD-10-CM | POA: Diagnosis present

## 2015-11-20 DIAGNOSIS — Z515 Encounter for palliative care: Secondary | ICD-10-CM | POA: Diagnosis present

## 2015-11-20 DIAGNOSIS — Z8249 Family history of ischemic heart disease and other diseases of the circulatory system: Secondary | ICD-10-CM | POA: Diagnosis not present

## 2015-11-20 DIAGNOSIS — Z91018 Allergy to other foods: Secondary | ICD-10-CM | POA: Diagnosis not present

## 2015-11-20 DIAGNOSIS — Z79891 Long term (current) use of opiate analgesic: Secondary | ICD-10-CM | POA: Diagnosis not present

## 2015-11-20 DIAGNOSIS — Z800 Family history of malignant neoplasm of digestive organs: Secondary | ICD-10-CM | POA: Diagnosis not present

## 2015-11-20 DIAGNOSIS — Z880 Allergy status to penicillin: Secondary | ICD-10-CM | POA: Diagnosis not present

## 2015-11-20 DIAGNOSIS — Z833 Family history of diabetes mellitus: Secondary | ICD-10-CM | POA: Diagnosis not present

## 2015-11-20 DIAGNOSIS — E785 Hyperlipidemia, unspecified: Secondary | ICD-10-CM | POA: Diagnosis present

## 2015-11-20 DIAGNOSIS — R4182 Altered mental status, unspecified: Secondary | ICD-10-CM | POA: Insufficient documentation

## 2015-11-20 DIAGNOSIS — Z853 Personal history of malignant neoplasm of breast: Secondary | ICD-10-CM | POA: Diagnosis not present

## 2015-11-20 DIAGNOSIS — Z923 Personal history of irradiation: Secondary | ICD-10-CM | POA: Diagnosis not present

## 2015-11-20 DIAGNOSIS — Z9221 Personal history of antineoplastic chemotherapy: Secondary | ICD-10-CM | POA: Diagnosis not present

## 2015-11-20 DIAGNOSIS — F0391 Unspecified dementia with behavioral disturbance: Secondary | ICD-10-CM | POA: Diagnosis not present

## 2015-11-20 DIAGNOSIS — Q825 Congenital non-neoplastic nevus: Secondary | ICD-10-CM | POA: Diagnosis not present

## 2015-11-20 DIAGNOSIS — I5032 Chronic diastolic (congestive) heart failure: Secondary | ICD-10-CM | POA: Diagnosis present

## 2015-11-20 DIAGNOSIS — I10 Essential (primary) hypertension: Secondary | ICD-10-CM | POA: Diagnosis not present

## 2015-11-20 DIAGNOSIS — Z86711 Personal history of pulmonary embolism: Secondary | ICD-10-CM | POA: Diagnosis not present

## 2015-11-20 DIAGNOSIS — S81802A Unspecified open wound, left lower leg, initial encounter: Secondary | ICD-10-CM

## 2015-11-20 LAB — I-STAT CG4 LACTIC ACID, ED: LACTIC ACID, VENOUS: 0.81 mmol/L (ref 0.5–2.0)

## 2015-11-20 MED ORDER — ONDANSETRON HCL 4 MG/2ML IJ SOLN: 4.0000 mg | Freq: Four times a day (QID) | INTRAMUSCULAR | Status: DC | PRN

## 2015-11-20 MED ORDER — QUETIAPINE FUMARATE 50 MG PO TABS: 50.0000 mg | ORAL_TABLET | Freq: Every day | ORAL | Status: DC

## 2015-11-20 MED ORDER — DOXYCYCLINE HYCLATE 100 MG PO TABS: 100.0000 mg | ORAL_TABLET | Freq: Two times a day (BID) | ORAL | Status: DC

## 2015-11-20 MED ORDER — TEMAZEPAM 15 MG PO CAPS: 30.0000 mg | ORAL_CAPSULE | Freq: Every evening | ORAL | Status: DC | PRN

## 2015-11-20 MED ORDER — SILVER SULFADIAZINE 1 % EX CREA
1.0000 "application " | TOPICAL_CREAM | Freq: Every day | CUTANEOUS | Status: DC
  Administered 2015-11-21 – 2015-11-24 (×4): 1 via TOPICAL
  Filled 2015-11-20: qty 85

## 2015-11-20 MED ORDER — IRBESARTAN 300 MG PO TABS
300.0000 mg | ORAL_TABLET | Freq: Every day | ORAL | Status: DC
  Administered 2015-11-20 – 2015-11-23 (×4): 300 mg via ORAL
  Filled 2015-11-20 (×5): qty 1

## 2015-11-20 MED ORDER — RISPERIDONE 0.5 MG PO TABS
0.5000 mg | ORAL_TABLET | Freq: Every day | ORAL | Status: DC
  Administered 2015-11-20 – 2015-11-23 (×4): 0.5 mg via ORAL
  Filled 2015-11-20 (×4): qty 1

## 2015-11-20 MED ORDER — MORPHINE SULFATE ER 30 MG PO TBCR
30.0000 mg | EXTENDED_RELEASE_TABLET | Freq: Two times a day (BID) | ORAL | Status: DC
  Administered 2015-11-20 – 2015-11-21 (×2): 30 mg via ORAL
  Filled 2015-11-20 (×2): qty 1

## 2015-11-20 MED ORDER — VANCOMYCIN HCL IN DEXTROSE 1-5 GM/200ML-% IV SOLN
1000.0000 mg | Freq: Once | INTRAVENOUS | Status: AC
  Administered 2015-11-20: 1000 mg via INTRAVENOUS
  Filled 2015-11-20: qty 200

## 2015-11-20 MED ORDER — FUROSEMIDE 40 MG PO TABS
40.0000 mg | ORAL_TABLET | Freq: Every day | ORAL | Status: DC
  Administered 2015-11-20 – 2015-11-22 (×3): 40 mg via ORAL
  Filled 2015-11-20 (×3): qty 1

## 2015-11-20 MED ORDER — ACETAMINOPHEN 650 MG RE SUPP
650.0000 mg | Freq: Four times a day (QID) | RECTAL | Status: DC | PRN
  Administered 2015-11-23: 650 mg via RECTAL
  Filled 2015-11-20: qty 1

## 2015-11-20 MED ORDER — SODIUM CHLORIDE 0.9 % IV BOLUS (SEPSIS): 250.0000 mL | Freq: Once | INTRAVENOUS | Status: DC

## 2015-11-20 MED ORDER — ENOXAPARIN SODIUM 40 MG/0.4ML ~~LOC~~ SOLN
40.0000 mg | SUBCUTANEOUS | Status: DC
  Administered 2015-11-20 – 2015-11-21 (×2): 40 mg via SUBCUTANEOUS
  Filled 2015-11-20 (×3): qty 0.4

## 2015-11-20 MED ORDER — ONDANSETRON HCL 4 MG/2ML IJ SOLN: 4.0000 mg | Freq: Three times a day (TID) | INTRAMUSCULAR | Status: DC | PRN

## 2015-11-20 MED ORDER — DOCUSATE SODIUM 100 MG PO CAPS
100.0000 mg | ORAL_CAPSULE | Freq: Two times a day (BID) | ORAL | Status: DC | PRN
Start: 2015-11-20 — End: 2015-11-21

## 2015-11-20 MED ORDER — ONDANSETRON HCL 4 MG PO TABS: 4.0000 mg | ORAL_TABLET | Freq: Four times a day (QID) | ORAL | Status: DC | PRN

## 2015-11-20 MED ORDER — COLLAGENASE 250 UNIT/GM EX OINT
TOPICAL_OINTMENT | Freq: Every day | CUTANEOUS | Status: DC
  Administered 2015-11-21: 10:00:00 via TOPICAL
  Filled 2015-11-20: qty 30

## 2015-11-20 MED ORDER — VANCOMYCIN HCL IN DEXTROSE 750-5 MG/150ML-% IV SOLN: 750.0000 mg | INTRAVENOUS | Status: DC

## 2015-11-20 MED ORDER — LEVOTHYROXINE SODIUM 25 MCG PO TABS
137.0000 ug | ORAL_TABLET | Freq: Every day | ORAL | Status: DC
  Administered 2015-11-20 – 2015-11-22 (×3): 137 ug via ORAL
  Filled 2015-11-20 (×3): qty 1

## 2015-11-20 MED ORDER — POTASSIUM CHLORIDE CRYS ER 20 MEQ PO TBCR
20.0000 meq | EXTENDED_RELEASE_TABLET | Freq: Every day | ORAL | Status: DC
  Administered 2015-11-20 – 2015-11-21 (×2): 20 meq via ORAL
  Filled 2015-11-20 (×2): qty 1

## 2015-11-20 MED ORDER — CIPROFLOXACIN IN D5W 400 MG/200ML IV SOLN
400.0000 mg | Freq: Two times a day (BID) | INTRAVENOUS | Status: DC
  Administered 2015-11-20: 400 mg via INTRAVENOUS
  Filled 2015-11-20 (×2): qty 200

## 2015-11-20 MED ORDER — LORAZEPAM 0.5 MG PO TABS
0.5000 mg | ORAL_TABLET | Freq: Three times a day (TID) | ORAL | Status: DC | PRN
  Administered 2015-11-20: 0.5 mg via ORAL
  Filled 2015-11-20: qty 1

## 2015-11-20 MED ORDER — DICYCLOMINE HCL 10 MG PO CAPS
10.0000 mg | ORAL_CAPSULE | Freq: Two times a day (BID) | ORAL | Status: DC | PRN
  Filled 2015-11-20: qty 1

## 2015-11-20 MED ORDER — OXYCODONE-ACETAMINOPHEN 7.5-325 MG PO TABS
1.0000 | ORAL_TABLET | ORAL | Status: DC | PRN
  Administered 2015-11-20 (×2): 1 via ORAL
  Filled 2015-11-20 (×2): qty 1

## 2015-11-20 MED ORDER — OXYCODONE-ACETAMINOPHEN 7.5-325 MG PO TABS: 2.0000 | ORAL_TABLET | ORAL | Status: DC | PRN

## 2015-11-20 MED ORDER — PANTOPRAZOLE SODIUM 40 MG PO TBEC
40.0000 mg | DELAYED_RELEASE_TABLET | Freq: Every day | ORAL | Status: DC
  Administered 2015-11-20 – 2015-11-22 (×3): 40 mg via ORAL
  Filled 2015-11-20 (×3): qty 1

## 2015-11-20 MED ORDER — MORPHINE SULFATE ER 15 MG PO TBCR
15.0000 mg | EXTENDED_RELEASE_TABLET | Freq: Two times a day (BID) | ORAL | Status: DC
  Administered 2015-11-20 (×2): 15 mg via ORAL
  Filled 2015-11-20 (×2): qty 1

## 2015-11-20 MED ORDER — ACETAMINOPHEN 325 MG PO TABS: 650.0000 mg | ORAL_TABLET | Freq: Four times a day (QID) | ORAL | Status: DC | PRN

## 2015-11-20 MED ORDER — HALOPERIDOL LACTATE 5 MG/ML IJ SOLN
2.0000 mg | Freq: Four times a day (QID) | INTRAMUSCULAR | Status: DC | PRN
  Administered 2015-11-20 – 2015-11-21 (×3): 2 mg via INTRAVENOUS
  Filled 2015-11-20 (×3): qty 1

## 2015-11-20 MED ORDER — AMLODIPINE BESYLATE 2.5 MG PO TABS
2.5000 mg | ORAL_TABLET | Freq: Every day | ORAL | Status: DC
  Administered 2015-11-20 – 2015-11-23 (×3): 2.5 mg via ORAL
  Filled 2015-11-20 (×5): qty 1

## 2015-11-20 NOTE — Progress Notes (Signed)
[facility] PROGRESS NOTE    Progress Note   Alexa [name] EGB:[id number] DOB: [birth year] DOA: November 23, 2015 PCP: Laurey Morale, MD   Brief Narrative:   Alexa [name] is an 79 y.o. female   Assessment/Plan:   Acute encephalopathy due to Cellulitis of left lower leg superimprosed on Dementia with behavioral disturbance: - She is afebrile with no leukocytosis and we'll de-escalate her antibiotic regimen to doxycycline. Care has been consulted, awaiting palliative care consult and physical therapy consult. Aricept was recently discontinued, Lasix) night and Haldol when necessary for agitation checkup 12-lead EKG tomorrow morning. Family related she did not know testing her blood drawn's. I'll get a good sense to move towards comfort care.  Chronic pain: Continue morphine.  Chronic diastolic heart failure: Seems to be compensated continue Lasix.  Hypothyroidism Continue Synthroid.  Essential hypertension Continue current regimen.        DVT Prophylaxis - Lovenox ordered.  Family Communication: friend of family Disposition Plan: SNF when stable Code Status:     Code Status Orders        Start     Ordered   11/20/15 0259  Do not attempt resuscitation (DNR)   Continuous    Question Answer Comment  In the event of cardiac or respiratory ARREST Do not call a "code blue"   In the event of cardiac or respiratory ARREST Do not perform Intubation, CPR, defibrillation or ACLS   In the event of cardiac or respiratory ARREST Use medication by any route, position, wound care, and other measures to relive pain and suffering. May use oxygen, suction and manual treatment of airway obstruction as needed for comfort.      11/20/15 0259        IV Access:    Peripheral IV   Procedures and diagnostic studies:   Dg Tibia/fibula Left  November 23, 2015  CLINICAL DATA:  Fall with left lower leg wound. EXAM: LEFT TIBIA AND FIBULA - 2 VIEW COMPARISON:  None. FINDINGS: Skin  irregularity and possible soft tissue gas anteriorly at the lower shin, correlating with history of wound in this location. There is no evidence of tracking subcutaneous gas or opaque foreign body. Subcutaneous fat reticulation throughout the leg, potential cellulitis. No evidence of osseous infection or fracture. Knee osteoarthritis, mild for age, with chondrocalcinosis. IMPRESSION: Soft tissue swelling without acute osseous abnormality, as above. Electronically Signed   By: Monte Fantasia M.D.   On: Nov 23, 2015 23:29   Ct Head Wo Contrast  11/20/2015  CLINICAL DATA:  Multiple falls over the past few days. Altered mental status. EXAM: CT HEAD WITHOUT CONTRAST TECHNIQUE: Contiguous axial images were obtained from the base of the skull through the vertex without intravenous contrast. COMPARISON:  03/20/2014 FINDINGS: There is no evidence of mass effect, midline shift or extra-axial fluid collections. There is no evidence of a space-occupying lesion or intracranial hemorrhage. There is periventricular white matter low attenuation likely secondary to microangiopathy. 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 There is low-attenuation in the left posterior parietal lobe most consistent with a subacute versus chronic infarct. There is a small all left cerebellar lacunar infarct. The ventricles and sulci are appropriate for the patient's age. The basal cisterns are patent. Visualized portions of the orbits are unremarkable. The visualized portions of the paranasal sinuses and mastoid air cells are unremarkable. The osseous structures are unremarkable. IMPRESSION: 1. Low-attenuation in the left posterior parietal lobe most consistent with a subacute versus chronic infarct. Electronically Signed  By: Kathreen Devoid   On: 11/20/2015 00:02     Medical Consultants:    None.  Anti-Infectives:   Anti-infectives    Start     Dose/Rate Route Frequency Ordered Stop   11/21/15 0400  vancomycin  (VANCOCIN) IVPB 750 mg/150 ml premix     750 mg 150 mL/hr over 60 Minutes Intravenous Every 24 hours 11/20/15 0320     11/20/15 0600  ciprofloxacin (CIPRO) IVPB 400 mg     400 mg 200 mL/hr over 60 Minutes Intravenous Every 12 hours 11/20/15 0320     11/20/15 0045  vancomycin (VANCOCIN) IVPB 1000 mg/200 mL premix     1,000 mg 200 mL/hr over 60 Minutes Intravenous  Once 11/20/15 0035 11/20/15 0455      Subjective:    Alexa [name]  sleepy to carry on a conversation.  Objective:    Filed Vitals:   11/20/15 0100 11/20/15 0130 11/20/15 0158 11/20/15 0618  BP: 154/86 117/53 124/43 135/55  Pulse: 103 98 89 95  Temp:   98.7 F (37.1 C) 99 F (37.2 C)  TempSrc:   Oral Oral  Resp: '15 16 20 20  '$ Height:   '5\' 5"'$  (1.651 m)   Weight:   72.7 kg (160 lb 4.4 oz)   SpO2: 97% 93% 99% 98%    Intake/Output Summary (Last 24 hours) at 11/20/15 4332 Last data filed at 11/19/15 2307  Gross per 24 hour  Intake      0 ml  Output    120 ml  Net   -120 ml   Filed Weights   11/19/15 2145 11/20/15 0158  Weight: 63.504 kg (140 lb) 72.7 kg (160 lb 4.4 oz)    Exam: Gen:  NAD Cardiovascular:  RRR, No M/R/G Chest and lungs:   CTAB Abdomen:  Abdomen soft, NT/ND, + BS Extremities:  No C/E/C   Data Reviewed:    Labs: Basic Metabolic Panel:  Recent Labs Lab 11/19/15 2209  NA 144  K 3.9  CL 106  CO2 27  GLUCOSE 154*  BUN 22*  CREATININE 1.27*  CALCIUM 8.7*   GFR Estimated Creatinine Clearance: 37.1 mL/min (by C-G formula based on Cr of 1.27). Liver Function Tests:  Recent Labs Lab 11/19/15 2209  AST 22  ALT 19  ALKPHOS 98  BILITOT 0.7  PROT 6.3*  ALBUMIN 2.8*   No results for input(s): LIPASE, AMYLASE in the last 168 hours. No results for input(s): AMMONIA in the last 168 hours. Coagulation profile No results for input(s): INR, PROTIME in the last 168 hours.  CBC:  Recent Labs Lab 11/19/15 2209  WBC 8.3  HGB 11.7*  HCT 35.3*  MCV 87.8  PLT 253   Cardiac  Enzymes: No results for input(s): CKTOTAL, CKMB, CKMBINDEX, TROPONINI in the last 168 hours. BNP (last 3 results) No results for input(s): PROBNP in the last 8760 hours. CBG: No results for input(s): GLUCAP in the last 168 hours. D-Dimer: No results for input(s): DDIMER in the last 72 hours. Hgb A1c: No results for input(s): HGBA1C in the last 72 hours. Lipid Profile: No results for input(s): CHOL, HDL, LDLCALC, TRIG, CHOLHDL, LDLDIRECT in the last 72 hours. Thyroid function studies: No results for input(s): TSH, T4TOTAL, T3FREE, THYROIDAB in the last 72 hours.  Invalid input(s): FREET3 Anemia work up: No results for input(s): VITAMINB12, FOLATE, FERRITIN, TIBC, IRON, RETICCTPCT in the last 72 hours. Sepsis Labs:  Recent Labs Lab 11/19/15 2209 11/19/15 2210 11/20/15 0058  WBC 8.3  --   --  LATICACIDVEN  --  0.92 0.81   Microbiology No results found for this or any previous visit (from the past 240 hour(s)).   Medications:   . amLODipine  2.5 mg Oral Daily  . ciprofloxacin  400 mg Intravenous Q12H  . collagenase   Topical Daily  . enoxaparin (LOVENOX) injection  40 mg Subcutaneous Q24H  . furosemide  40 mg Oral Daily  . irbesartan  300 mg Oral Daily  . levothyroxine  137 mcg Oral QAC breakfast  . morphine  15 mg Oral Q12H  . pantoprazole  40 mg Oral Daily  . potassium chloride SA  20 mEq Oral Daily  . risperiDONE  0.5 mg Oral QHS  . silver sulfADIAZINE  1 application Topical Daily  . sodium chloride  250 mL Intravenous Once  . [START ON 11/21/2015] vancomycin  750 mg Intravenous Q24H   Continuous Infusions:   Time spent: 25 min   LOS: 0 days   Charlynne Cousins  [facility] Pager [phone]  *Please refer to [email or web address], password [email or web address] to get updated schedule on who will round on this patient, as hospitalists switch teams weekly. If 7PM-7AM, please contact night-coverage at [email or web address], password [email or web address] for any overnight needs.  11/20/2015, 9:38 AM

## 2015-11-20 NOTE — Progress Notes (Signed)
Spoke with Dr. Aileen Fass regarding palliative care NP assessment findings of possible hip injury and plan for possible xray. MD states will assess patient when able. Charge nurse and pt's family aware. Wendee Copp

## 2015-11-20 NOTE — Care Management Note (Signed)
Case Management Note  Patient Details  Name: Alexa [name] MRN: [id number] Date of Birth: 09/01/1936  Subjective/Objective:    Patient admitted with Acute Encephalopathy. Patient also with cellulitis of LLE on IV abx. Patient is from home.                 Action/Plan: Awaiting PT/OT and palliative recommendations. CM will continue to follow for discharge needs.   Expected Discharge Date:                  Expected Discharge Plan:     In-House Referral:     Discharge planning Services     Post Acute Care Choice:    Choice offered to:     DME Arranged:    DME Agency:     HH Arranged:    HH Agency:     Status of Service:  In process, will continue to follow  Medicare Important Message Given:    Date Medicare IM Given:    Medicare IM give by:    Date Additional Medicare IM Given:    Additional Medicare Important Message give by:     If discussed at [place] of Stay Meetings, dates discussed:    Additional Comments:  Pollie Friar, RN 11/20/2015, 3:25 PM

## 2015-11-20 NOTE — ED Notes (Signed)
DNR paper tubed to [facility], 5C called and made aware

## 2015-11-20 NOTE — Progress Notes (Signed)
Pt transferred to unit from ED via NT x 1. Pt responds to voice upon arrival, speech incomprehensible. No signs or symptoms of acute distress. Pt has one 2 cm, and one .5 cm pressure ulcer to right buttock. Allevyn foam dressing applied to wounds. Pt resting in bed at lowest position, bed alarm on call light in reach. Will continue to monitor. [name], RN, BSN 11/20/2015 2:12 AM

## 2015-11-20 NOTE — Telephone Encounter (Signed)
  Oncology Nurse Navigator Documentation    Navigator Encounter Type: Other Runner, broadcasting/film/video Navigator contact) (11/20/15 1200) Patient Visit Type: Follow-up (11/20/15 1200)   Barriers/Navigation Needs: No barriers at this time (11/20/15 1200)  I called patient to check in.  Patient currently an inpatient.  Her daughter, [name] reports that she has had a stroke and is not doing well.  They are waiting for a Palliative Care consult.  [name] denied any needs at this time.  I encouraged her to call me if she needs any assistance.             Time Spent with Patient: 15 (11/20/15 1200)

## 2015-11-20 NOTE — H&P (Signed)
[facility] History and Physical  JAYMEE TILSON DGL:[id number] DOB: [birth year] DOA: 11/19/2015  Referring physician: Ms.Sofia. PCP: Laurey Morale, MD  Specialists: Dr.Dohmier.Neurologist.  Chief Complaint: Fall and increasing confusion.  HPI: Alexa [name] is a 79 y.o. female history of advanced dementia, diastolic CHF, history of PE not on anticoagulation secondary to intracranial hemorrhage, hypertension hypothyroidism was brought to the ER after patient had a fall at her house. Patient also was found to be increasingly confused. As per patient's daughter who provided the history patient has been having falls recently and has found it increasingly difficult to walk. In the patient was found to be febrile. Left lower extremity has a wound which is draining. CT of the head also shows possible infarct which could be subacute to chronic. On exam patient is not in distress and left lower extremity anterior shin has an ulcer which measures around 5 cm in diameter. At this time patient has been started on antibiotics for wound infection. Patient's daughter at this time wants only comfort measures and treatment for infection and no aggressive measures including further tests. It was also found that family is finding it increasingly difficult to manage patient at home and would probably need nursing home placement.   Review of Systems: As presented in the history of presenting illness, rest negative.  Past Medical History  Diagnosis Date  . Alzheimer disease   . Hyperlipidemia     a. Myalgias with multiple statins.  . Hypertension   . GERD (gastroesophageal reflux disease)   . Diverticulosis   . Hx of adenomatous colonic polyps   . DVT (deep venous thrombosis) ([place]) 09-30-11; 01/2014    RLE; RLE  . Hypothyroidism   . Asthma   . History of tobacco abuse     a. quit 25+ years ago. PFTs in 2/15 were relatively normal.   . Chronic diastolic CHF (congestive heart failure) ([place])     a.  ETT-myoview (9/12) with 4' exercise, EF 68%, no ischemia or infarction. b. Echo (10/14) with EF 55-60%, mod diast dysfunction, normal RV size/fcn, mod pulm HTN.   . Venous insufficiency     a. H/o venous stasis ulceration.   Marland Kitchen PAF (paroxysmal atrial fibrillation) ([place])     a. Only episode that noted was in setting of right upper lobectomy in 2014. She was briefly on amiodarone but stopped it on her own. She is not coumadin. If recurrent AF, will need to consider.  . Pulmonary HTN ([place])     a. Moderate by echo 08/2013.  Marland Kitchen Cerebral infarct ([place])     a.  Remote anterior left frontal lobe infarct by CT head 01/2014.  . PE (pulmonary embolism) 01/2014  . Pneumonia     "couple times maybe" (12/13/2014)  . Arthritis     "qwhere"  . DDD (degenerative disc disease)     a. cervical and lumbar herniation and DJD s/p multiple diskectomies and other back surgeries from 2002-2008   . Chronic lower back pain   . Alzheimer's dementia     a. sees Dr. Brett Fairy. b. on Aricept.; "stage 6" (12/13/2014)  . Kidney stones   . breast dx'd 2002    left  . Adenocarcinoma of breast ([place])     a. s/p right simple and left modified radical mastectomy with chemotherapy and radiation in 2002.   . Non-small cell lung cancer ([place])     a. s/p right upper lobectomy in 2014.   Marland Kitchen ADENOCARCINOMA, BREAST, BILATERAL, HX OF 02/22/2008  Qualifier: Diagnosis of  By: Niel Hummer MD, [place], RIGHT SHOULDER 12/15/2010    Qualifier: Diagnosis of  By: Niel Hummer MD, Lorinda Creed SAH (subarachnoid hemorrhage) ([place]) 03/12/2014  . Ventricular tachycardia ([place]) 02/28/2014    4 beat run ventricular tachycardia in the hospital, February 28, 2014    Past Surgical History  Procedure Laterality Date  . Cholecystectomy  2007  . Lumbar laminectomy  X 3  . Shoulder open rotator cuff repair Right 1995  . Hip surgery    . Abdominal hysterectomy    . Fiberoptic bronchoscopy with electromagnetic  10/07/2010  . Video  bronchoscopy N/A 05/14/2013    Procedure: VIDEO BRONCHOSCOPY;  Surgeon: Grace Isaac, MD;  Location: [facility] OR;  Service: Thoracic;  Laterality: N/A;  . Video assisted thoracoscopy (vats)/ lobectomy Right 05/14/2013    Procedure: VIDEO ASSISTED THORACOSCOPY (VATS)/ LOBECTOMY;  Surgeon: Grace Isaac, MD;  Location: [facility];  Service: Thoracic;  Laterality: Right;  . Colonoscopy  02-18-09    per Dr. Fuller Plan, adenomatous polyp, repeat in 5 yrs   . Anterior cervical decomp/discectomy fusion  X 2?  . Varicose vein surgery Right   . Hemorrhoid surgery  1985  . Kidney stone surgery  1987  . Vena cava filter placement  01/2014  . Lobectomy    . Back surgery    . Mastectomy Bilateral 04/2001  . Breast biopsy  2002  . Lithotripsy  X 2?  . Laparoscopic appendectomy N/A 07/25/2015    Procedure: APPENDECTOMY LAPAROSCOPIC;  Surgeon: Jackolyn Confer, MD;  Location: [facility] ORS;  Service: General;  Laterality: N/A;   Social History:  reports that she quit smoking about 31 years ago. Her smoking use included Cigarettes. She started smoking about 56 years ago. She has a 25 pack-year smoking history. She has never used smokeless tobacco. She reports that she does not drink alcohol or use illicit drugs. Where does patient live home. Can patient participate in ADLs? No.  Allergies  Allergen Reactions  . Simvastatin Other (See Comments)    myalgia  . Statins Other (See Comments)    Myalgias with multiple statins   . Strawberry Extract Swelling    Swelling ,rash  . Strawberry Extract Hives  . Hydrocodone-Homatropine Nausea Only    No problems with oxycodone  . Nsaids Other (See Comments)    unknown  . Penicillins Rash    Family History:  Family History  Problem Relation Age of Onset  . Diabetes Daughter   . Diabetes Daughter   . Heart disease Father   . Heart disease Brother   . Dementia Brother   . Heart disease Sister   . Cancer Sister     breast  . Colon cancer Daughter 63  . Colon polyps  Daughter   . Stroke Mother   . Liver disease Daughter   . Heart attack    . Cancer Sister     breast      Prior to Admission medications   Medication Sig Start Date End Date Taking? Authorizing Provider  amLODipine (NORVASC) 2.5 MG tablet Take 1 tablet (2.5 mg total) by mouth daily. 08/29/15  Yes Scott T Kathlen Mody, PA-C  cephALEXin (KEFLEX) 250 MG/5ML suspension Take 10 mLs (500 mg total) by mouth 4 (four) times daily. 11/18/15  Yes Laurey Morale, MD  dicyclomine (BENTYL) 10 MG capsule Take 1 capsule (10 mg total) by mouth 2 (two) times daily as needed for spasms.  10/15/14  Yes Willia Craze, NP  docusate sodium (COLACE) 100 MG capsule Take 1 capsule (100 mg total) by mouth 2 (two) times daily. Patient taking differently: Take 100 mg by mouth 2 (two) times daily as needed for mild constipation.  03/21/15  Yes Laurey Morale, MD  esomeprazole (NEXIUM) 40 MG capsule Take 1 capsule (40 mg total) by mouth daily at 12 noon. 03/21/15  Yes Laurey Morale, MD  furosemide (LASIX) 40 MG tablet Take 40 mg by mouth daily.    Yes Historical Provider, MD  levothyroxine (SYNTHROID, LEVOTHROID) 137 MCG tablet TAKE 1 TABLET BY MOUTH EVERY DAY 08/22/15  Yes Laurey Morale, MD  LORazepam (ATIVAN) 0.5 MG tablet Take 1 tablet (0.5 mg total) by mouth every 8 (eight) hours as needed for anxiety. 09/08/15  Yes Laurey Morale, MD  morphine (MS CONTIN) 15 MG 12 hr tablet Take 1 tablet by mouth every 12 (twelve) hours. 09/24/15  Yes Historical Provider, MD  oxyCODONE-acetaminophen (PERCOCET) 7.5-325 MG per tablet Take 1 tablet by mouth every 4 (four) hours as needed for moderate pain or severe pain. 07/26/15  Yes Michael Boston, MD  potassium chloride SA (K-DUR,KLOR-CON) 20 MEQ tablet Take 20 mEq by mouth daily. 02/13/14  Yes Larey Dresser, MD  risperiDONE (RISPERDAL) 0.25 MG tablet TAKE 1 TABLET BY MOUTH AT BEDTIME Patient taking differently: TAKE 2 TABLETs BY MOUTH AT BEDTIME 09/29/15  Yes Laurey Morale, MD  silver  sulfADIAZINE (SILVADENE) 1 % cream Apply 1 application topically daily. 11/18/15  Yes Laurey Morale, MD  temazepam (RESTORIL) 30 MG capsule TAKE ONE CAPSULE BY MOUTH AT BEDTIME AS NEEDED Patient taking differently: TAKE ONE CAPSULE BY MOUTH AT BEDTIME AS NEEDED FOR SLEEP. 11/11/15  Yes Laurey Morale, MD  valsartan (DIOVAN) 320 MG tablet Take 1 tablet (320 mg total) by mouth daily. 08/29/15  Yes Liliane Shi, PA-C    Physical Exam: Filed Vitals:   11/20/15 0045 11/20/15 0100 11/20/15 0130 11/20/15 0158  BP: 164/82 154/86 117/53 124/43  Pulse: 108 103 98 89  Temp:    98.7 F (37.1 C)  TempSrc:    Oral  Resp: '26 15 16 20  '$ Height:    '5\' 5"'$  (1.651 m)  Weight:    72.7 kg (160 lb 4.4 oz)  SpO2: 98% 97% 93% 99%     General:  Moderately built and nourished.  Eyes: Anicteric no pallor.  ENT: No discharge from the ears eyes nose.  Neck: No mass felt.  Cardiovascular: S1-S2 heard.  Respiratory: No rhonchi or crepitations.  Abdomen: Soft nontender bowel sounds present.  Skin: Left lower extremity anterior shin has also measuring over 5 cm the discharge.  Musculoskeletal: Bilateral lower extremity edema.  Psychiatric: Patient is demented.  Neurologic: Patient is demented and does not follow commands.  Labs on Admission:  Basic Metabolic Panel:  Recent Labs Lab 11/19/15 2209  NA 144  K 3.9  CL 106  CO2 27  GLUCOSE 154*  BUN 22*  CREATININE 1.27*  CALCIUM 8.7*   Liver Function Tests:  Recent Labs Lab 11/19/15 2209  AST 22  ALT 19  ALKPHOS 98  BILITOT 0.7  PROT 6.3*  ALBUMIN 2.8*   No results for input(s): LIPASE, AMYLASE in the last 168 hours. No results for input(s): AMMONIA in the last 168 hours. CBC:  Recent Labs Lab 11/19/15 2209  WBC 8.3  HGB 11.7*  HCT 35.3*  MCV 87.8  PLT 253  Cardiac Enzymes: No results for input(s): CKTOTAL, CKMB, CKMBINDEX, TROPONINI in the last 168 hours.  BNP (last 3 results)  Recent Labs  12/13/14 1435  BNP  568.1*    ProBNP (last 3 results) No results for input(s): PROBNP in the last 8760 hours.  CBG: No results for input(s): GLUCAP in the last 168 hours.  Radiological Exams on Admission: Dg Tibia/fibula Left  11/19/2015  CLINICAL DATA:  Fall with left lower leg wound. EXAM: LEFT TIBIA AND FIBULA - 2 VIEW COMPARISON:  None. FINDINGS: Skin irregularity and possible soft tissue gas anteriorly at the lower shin, correlating with history of wound in this location. There is no evidence of tracking subcutaneous gas or opaque foreign body. Subcutaneous fat reticulation throughout the leg, potential cellulitis. No evidence of osseous infection or fracture. Knee osteoarthritis, mild for age, with chondrocalcinosis. IMPRESSION: Soft tissue swelling without acute osseous abnormality, as above. Electronically Signed   By: Monte Fantasia M.D.   On: 11/19/2015 23:29   Ct Head Wo Contrast  11/20/2015  CLINICAL DATA:  Multiple falls over the past few days. Altered mental status. EXAM: CT HEAD WITHOUT CONTRAST TECHNIQUE: Contiguous axial images were obtained from the base of the skull through the vertex without intravenous contrast. COMPARISON:  03/20/2014 FINDINGS: There is no evidence of mass effect, midline shift or extra-axial fluid collections. There is no evidence of a space-occupying lesion or intracranial hemorrhage. There is periventricular white matter low attenuation likely secondary to microangiopathy. 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 There is low-attenuation in the left posterior parietal lobe most consistent with a subacute versus chronic infarct. There is a small all left cerebellar lacunar infarct. The ventricles and sulci are appropriate for the patient's age. The basal cisterns are patent. Visualized portions of the orbits are unremarkable. The visualized portions of the paranasal sinuses and mastoid air cells are unremarkable. The osseous structures are unremarkable.  IMPRESSION: 1. Low-attenuation in the left posterior parietal lobe most consistent with a subacute versus chronic infarct. Electronically Signed   By: Kathreen Devoid   On: 11/20/2015 00:02     Assessment/Plan Principal Problem:   Acute encephalopathy Active Problems:   Hypothyroidism   Essential hypertension   Dementia with behavioral disturbance   Wound of left leg   Cellulitis of left lower leg   1. Acute encephalopathy - differentials include worsening dementia secondary to infection and possible stroke also possibly symptoms. At this time family has requested no further workup but continue antibiotics for the left lower extremity wound. They also have requested social work consult for possible placement as family has found it increasingly difficult to manage patient at home. Patient's family also wants patient be on comfort feed knowing that patient can aspirate. I have also requested palliative team consult. 2. Advanced dementia - patient's Aricept was recently discontinued. 3. Chronic pain - on morphine. 4. Hypertension - continue home medications. 5. Diastolic CHF - appears compensated. Continue Lasix. 6. Sacral decubitus ulcers. 7. Hypothyroidism on Synthroid. 8. Acute renal failure probably from dehydration and poor oral intake.  At this time patient's family has requested no further tests including x-rays or blood test or any further imaging. They want patient to be made comfort measures only but continue antibiotics for the left leg wound. I have requested palliative team consult and also social work consult.   DVT ProphylaxisLovenox.  Code Status: DO NOT RESUSCITATE.  Family Communication: Patient's daughter.  Disposition Plan: Admit to inpatient.    Gean Birchwood  NMarland Kitchen [facility] Pager [phone].  If 7PM-7AM, please contact night-coverage [email or web address] Password [email or web address] 11/20/2015, 3:00 AM

## 2015-11-20 NOTE — Consult Note (Addendum)
WOC wound consult note Reason for Consult: LLE wound, noted at the time of my assessment bedside family friend reports wound on her buttocks as well. Patient with dementia, not able to provide any information.  Friend reports leg wound from a fall but based on the appearance it appears more chronic.  She also reports patient has been ambulatory until recently.  Wound type: Stage 3 Pressure injury x 2 right buttock sDTI (suspected deep tissue injury) sacrum Full thickness non healing ulcer LLE, ? trauma Pressure Ulcer POA: Yes Measurement: Right buttock 2cm x 2.5cm x 0.2cm  Right buttock 0.2cm x 0.2cm x 0.1cm  Right pretibial 4cm x 6cm x 0.2cm  sDTI sacrum/right buttock/right hip 15cm x >25cm x 0 Wound bed: Right buttock distal 50% yellow slough, 50% pink Right buttock proximal 100% pink Sacrum: central dark purple area 4cm x 2cm x 0 otherwise the area is pink and does not blanch over most of the affected area  Right pretibial; 80% fibrin/loose yellow in places/20% pink granulation buds Drainage (amount, consistency, odor)  Right pretibial: moderate, yellow, no odor Sacrum/right buttock: minimal, no odor, serosanguinous  Periwound: intact, she does have some skin discoloration on her lower back that is pink, similar areas on the right posterior calf.   Dressing procedure/placement/frequency: Add enzymatic debridement ointment for the right buttock wound, change daily.  Sacral dressing to cover the affected areas Add air mattress for pressure redistribution Silver hydrofiber for exudate management and antimicrobial effects left pretibial wound    Discussed POC with bedside nurse.  Re consult if needed, will not follow at this time. Thanks  Anacleto Batterman [place], [place] [phone])   Contacted daughter [name] with update via phone, she reports her mother has a purple, red birthmark from her right hip to her right heel. The area I have described above excluding the open ulcers may be her  birthmark.  Her daughter will look at the site when she is back in the patient's room.  I have explained we do not treat deep tissue injuries topically but that I have ordered her an air mattress due to the pressure injuries on the right ischium.  [name] thanked me for the update. Marica Otter

## 2015-11-20 NOTE — Progress Notes (Signed)
ANTIBIOTIC CONSULT NOTE - INITIAL  Pharmacy Consult for Vancocin and Cipro Indication: cellulitis  Allergies  Allergen Reactions  . Simvastatin Other (See Comments)    myalgia  . Statins Other (See Comments)    Myalgias with multiple statins   . Strawberry Extract Swelling    Swelling ,rash  . Strawberry Extract Hives  . Hydrocodone-Homatropine Nausea Only    No problems with oxycodone  . Nsaids Other (See Comments)    unknown  . Penicillins Rash    Patient Measurements: Height: '5\' 5"'$  (165.1 cm) Weight: 160 lb 4.4 oz (72.7 kg) IBW/kg (Calculated) : 57  Vital Signs: Temp: 98.7 F (37.1 C) (12/22 0158) Temp Source: Oral (12/22 0158) BP: 124/43 mmHg (12/22 0158) Pulse Rate: 89 (12/22 0158)  Labs:  Recent Labs  11/19/15 2209  WBC 8.3  HGB 11.7*  PLT 253  CREATININE 1.27*   Estimated Creatinine Clearance: 37.1 mL/min (by C-G formula based on Cr of 1.27).  Medical History: Past Medical History  Diagnosis Date  . Alzheimer disease   . Hyperlipidemia     a. Myalgias with multiple statins.  . Hypertension   . GERD (gastroesophageal reflux disease)   . Diverticulosis   . Hx of adenomatous colonic polyps   . DVT (deep venous thrombosis) ([place]) 09-30-11; 01/2014    RLE; RLE  . Hypothyroidism   . Asthma   . History of tobacco abuse     a. quit 25+ years ago. PFTs in 2/15 were relatively normal.   . Chronic diastolic CHF (congestive heart failure) ([place])     a. ETT-myoview (9/12) with 4' exercise, EF 68%, no ischemia or infarction. b. Echo (10/14) with EF 55-60%, mod diast dysfunction, normal RV size/fcn, mod pulm HTN.   . Venous insufficiency     a. H/o venous stasis ulceration.   Marland Kitchen PAF (paroxysmal atrial fibrillation) ([place])     a. Only episode that noted was in setting of right upper lobectomy in 2014. She was briefly on amiodarone but stopped it on her own. She is not coumadin. If recurrent AF, will need to consider.  . Pulmonary HTN ([place])     a. Moderate by echo  08/2013.  Marland Kitchen Cerebral infarct ([place])     a.  Remote anterior left frontal lobe infarct by CT head 01/2014.  . PE (pulmonary embolism) 01/2014  . Pneumonia     "couple times maybe" (12/13/2014)  . Arthritis     "qwhere"  . DDD (degenerative disc disease)     a. cervical and lumbar herniation and DJD s/p multiple diskectomies and other back surgeries from 2002-2008   . Chronic lower back pain   . Alzheimer's dementia     a. sees Dr. Brett Fairy. b. on Aricept.; "stage 6" (12/13/2014)  . Kidney stones   . breast dx'd 2002    left  . Adenocarcinoma of breast ([place])     a. s/p right simple and left modified radical mastectomy with chemotherapy and radiation in 2002.   . Non-small cell lung cancer ([place])     a. s/p right upper lobectomy in 2014.   Marland Kitchen ADENOCARCINOMA, BREAST, BILATERAL, HX OF 02/22/2008    Qualifier: Diagnosis of  By: Niel Hummer MD, [place], RIGHT SHOULDER 12/15/2010    Qualifier: Diagnosis of  By: Niel Hummer MD, Lorinda Creed   . SAH (subarachnoid hemorrhage) ([place]) 03/12/2014  . Ventricular tachycardia ([place]) 02/28/2014    4 beat run ventricular tachycardia in the hospital,  February 28, 2014     Medications:  Prescriptions prior to admission  Medication Sig Dispense Refill Last Dose  . amLODipine (NORVASC) 2.5 MG tablet Take 1 tablet (2.5 mg total) by mouth daily. 90 tablet 3 11/19/2015 at Unknown time  . cephALEXin (KEFLEX) 250 MG/5ML suspension Take 10 mLs (500 mg total) by mouth 4 (four) times daily. 400 mL 0 11/19/2015 at Unknown time  . dicyclomine (BENTYL) 10 MG capsule Take 1 capsule (10 mg total) by mouth 2 (two) times daily as needed for spasms. 60 capsule 0 unk  . docusate sodium (COLACE) 100 MG capsule Take 1 capsule (100 mg total) by mouth 2 (two) times daily. (Patient taking differently: Take 100 mg by mouth 2 (two) times daily as needed for mild constipation. ) 60 capsule 11 unk  . esomeprazole (NEXIUM) 40 MG capsule Take 1 capsule (40 mg total) by mouth  daily at 12 noon. 30 capsule 11 11/19/2015 at Unknown time  . furosemide (LASIX) 40 MG tablet Take 40 mg by mouth daily.    11/19/2015 at Unknown time  . levothyroxine (SYNTHROID, LEVOTHROID) 137 MCG tablet TAKE 1 TABLET BY MOUTH EVERY DAY 30 tablet 3 11/19/2015 at Unknown time  . LORazepam (ATIVAN) 0.5 MG tablet Take 1 tablet (0.5 mg total) by mouth every 8 (eight) hours as needed for anxiety. 60 tablet 2 unk  . morphine (MS CONTIN) 15 MG 12 hr tablet Take 1 tablet by mouth every 12 (twelve) hours.   11/19/2015 at Unknown time  . oxyCODONE-acetaminophen (PERCOCET) 7.5-325 MG per tablet Take 1 tablet by mouth every 4 (four) hours as needed for moderate pain or severe pain. 30 tablet 0 unk  . potassium chloride SA (K-DUR,KLOR-CON) 20 MEQ tablet Take 20 mEq by mouth daily.   11/19/2015 at Unknown time  . risperiDONE (RISPERDAL) 0.25 MG tablet TAKE 1 TABLET BY MOUTH AT BEDTIME (Patient taking differently: TAKE 2 TABLETs BY MOUTH AT BEDTIME) 30 tablet 5 11/19/2015 at Unknown time  . silver sulfADIAZINE (SILVADENE) 1 % cream Apply 1 application topically daily. 50 g 2 11/19/2015 at Unknown time  . temazepam (RESTORIL) 30 MG capsule TAKE ONE CAPSULE BY MOUTH AT BEDTIME AS NEEDED (Patient taking differently: TAKE ONE CAPSULE BY MOUTH AT BEDTIME AS NEEDED FOR SLEEP.) 30 capsule 5 UNK  . valsartan (DIOVAN) 320 MG tablet Take 1 tablet (320 mg total) by mouth daily. 90 tablet 3 11/19/2015 at Unknown time   Scheduled:  . amLODipine  2.5 mg Oral Daily  . enoxaparin (LOVENOX) injection  40 mg Subcutaneous Q24H  . furosemide  40 mg Oral Daily  . irbesartan  300 mg Oral Daily  . levothyroxine  137 mcg Oral Daily  . morphine  15 mg Oral Q12H  . pantoprazole  40 mg Oral Daily  . potassium chloride SA  20 mEq Oral Daily  . risperiDONE  0.5 mg Oral QHS  . silver sulfADIAZINE  1 application Topical Daily  . sodium chloride  250 mL Intravenous Once  . vancomycin  1,000 mg Intravenous Once    Assessment: 79yo  female was seen at PCP on 12/20 for wound recheck after fall 10/8, wound now looks infected w/ foul odor and green/brown drainage, sent home w/ Rx for Keflex and Silvadene, presented to ED 12/21 for lethargy and AMS from baseline (known h/o dementia), to begin IV ABX for cellulitis.  Goal of Therapy:  Vancomycin trough level 10-15 mcg/ml  Plan:  Will give vancomycin '1000mg'$  IV x1 followed by '750mg'$   IV Q24H as well as Cipro '400mg'$  IV Q12H and monitor CBC, Cx, levels prn.  Wynona Neat, PharmD, BCPS  11/20/2015,3:10 AM

## 2015-11-21 DIAGNOSIS — Z515 Encounter for palliative care: Secondary | ICD-10-CM | POA: Insufficient documentation

## 2015-11-21 DIAGNOSIS — Z7189 Other specified counseling: Secondary | ICD-10-CM | POA: Insufficient documentation

## 2015-11-21 DIAGNOSIS — L8990 Pressure ulcer of unspecified site, unspecified stage: Secondary | ICD-10-CM | POA: Insufficient documentation

## 2015-11-21 DIAGNOSIS — F03C0 Unspecified dementia, severe, without behavioral disturbance, psychotic disturbance, mood disturbance, and anxiety: Secondary | ICD-10-CM | POA: Insufficient documentation

## 2015-11-21 DIAGNOSIS — F0390 Unspecified dementia without behavioral disturbance: Secondary | ICD-10-CM

## 2015-11-21 MED ORDER — LORAZEPAM 2 MG/ML IJ SOLN
0.5000 mg | INTRAMUSCULAR | Status: DC | PRN
Start: 2015-11-21 — End: 2015-11-24

## 2015-11-21 MED ORDER — BISACODYL 10 MG RE SUPP
10.0000 mg | RECTAL | Status: DC
  Administered 2015-11-23: 10 mg via RECTAL
  Filled 2015-11-21: qty 1

## 2015-11-21 MED ORDER — MORPHINE SULFATE (CONCENTRATE) 10 MG/0.5ML PO SOLN
5.0000 mg | ORAL | Status: DC
  Administered 2015-11-21 – 2015-11-24 (×10): 5 mg via SUBLINGUAL
  Filled 2015-11-21 (×9): qty 0.5

## 2015-11-21 MED ORDER — MORPHINE SULFATE (CONCENTRATE) 10 MG/0.5ML PO SOLN: 5.0000 mg | ORAL | Status: DC

## 2015-11-21 MED ORDER — MORPHINE SULFATE (CONCENTRATE) 10 MG/0.5ML PO SOLN
5.0000 mg | ORAL | Status: DC | PRN
  Administered 2015-11-22: 10 mg via SUBLINGUAL
  Filled 2015-11-21 (×3): qty 0.5

## 2015-11-21 MED ORDER — LORAZEPAM 0.5 MG PO TABS
0.5000 mg | ORAL_TABLET | ORAL | Status: DC | PRN
  Administered 2015-11-22 – 2015-11-23 (×4): 1 mg via ORAL
  Filled 2015-11-21 (×4): qty 2

## 2015-11-21 MED ORDER — SENNA 8.6 MG PO TABS
2.0000 | ORAL_TABLET | Freq: Every day | ORAL | Status: DC
  Administered 2015-11-21 – 2015-11-23 (×3): 17.2 mg via ORAL
  Filled 2015-11-21 (×4): qty 2

## 2015-11-21 NOTE — Progress Notes (Signed)
[facility] PROGRESS NOTE    Progress Note   Alexa [name] WGN:[id number] DOB: 12/29/1935 DOA: December 15, 2015 PCP: Laurey Morale, MD   Brief Narrative:   Alexa [name] is an 79 y.o. female   Assessment/Plan:   Acute encephalopathy due to Cellulitis of left lower leg superimprosed on Dementia with behavioral disturbance: She is afebrile with no leukocytosis and we'll de-escalate her antibiotic regimen to doxycycline. Family related she did not know testing her blood drawn's. Family decided to move toward comfort care.  Chronic pain: Continue morphine.  Chronic diastolic heart failure: Seems to be compensated continue Lasix.  Hypothyroidism Continue Synthroid.  Essential hypertension Continue current regimen.        DVT Prophylaxis - Lovenox ordered.  Family Communication: friend of family Disposition Plan: Hospice when bed available Code Status:     Code Status Orders        Start     Ordered   11/20/15 0259  Do not attempt resuscitation (DNR)   Continuous    Question Answer Comment  In the event of cardiac or respiratory ARREST Do not call a "code blue"   In the event of cardiac or respiratory ARREST Do not perform Intubation, CPR, defibrillation or ACLS   In the event of cardiac or respiratory ARREST Use medication by any route, position, wound care, and other measures to relive pain and suffering. May use oxygen, suction and manual treatment of airway obstruction as needed for comfort.      11/20/15 0259        IV Access:    Peripheral IV   Procedures and diagnostic studies:   Dg Tibia/fibula Left  12-15-15  CLINICAL DATA:  Fall with left lower leg wound. EXAM: LEFT TIBIA AND FIBULA - 2 VIEW COMPARISON:  None. FINDINGS: Skin irregularity and possible soft tissue gas anteriorly at the lower shin, correlating with history of wound in this location. There is no evidence of tracking subcutaneous gas or opaque foreign body. Subcutaneous fat  reticulation throughout the leg, potential cellulitis. No evidence of osseous infection or fracture. Knee osteoarthritis, mild for age, with chondrocalcinosis. IMPRESSION: Soft tissue swelling without acute osseous abnormality, as above. Electronically Signed   By: Monte Fantasia M.D.   On: 12/15/2015 23:29   Ct Head Wo Contrast  11/20/2015  CLINICAL DATA:  Multiple falls over the past few days. Altered mental status. EXAM: CT HEAD WITHOUT CONTRAST TECHNIQUE: Contiguous axial images were obtained from the base of the skull through the vertex without intravenous contrast. COMPARISON:  03/20/2014 FINDINGS: There is no evidence of mass effect, midline shift or extra-axial fluid collections. There is no evidence of a space-occupying lesion or intracranial hemorrhage. There is periventricular white matter low attenuation likely secondary to microangiopathy. 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 There is low-attenuation in the left posterior parietal lobe most consistent with a subacute versus chronic infarct. There is a small all left cerebellar lacunar infarct. The ventricles and sulci are appropriate for the patient's age. The basal cisterns are patent. Visualized portions of the orbits are unremarkable. The visualized portions of the paranasal sinuses and mastoid air cells are unremarkable. The osseous structures are unremarkable. IMPRESSION: 1. Low-attenuation in the left posterior parietal lobe most consistent with a subacute versus chronic infarct. Electronically Signed   By: Kathreen Devoid   On: 11/20/2015 00:02     Medical Consultants:    None.  Anti-Infectives:   Anti-infectives    Start  Dose/Rate Route Frequency Ordered Stop   11/21/15 0400  vancomycin (VANCOCIN) IVPB 750 mg/150 ml premix  Status:  Discontinued     750 mg 150 mL/hr over 60 Minutes Intravenous Every 24 hours 11/20/15 0320 11/20/15 0947   11/20/15 1100  doxycycline (VIBRA-TABS) tablet 100 mg   Status:  Discontinued     100 mg Oral Every 12 hours 11/20/15 0947 11/20/15 1525   11/20/15 0600  ciprofloxacin (CIPRO) IVPB 400 mg  Status:  Discontinued     400 mg 200 mL/hr over 60 Minutes Intravenous Every 12 hours 11/20/15 0320 11/20/15 0947   11/20/15 0045  vancomycin (VANCOCIN) IVPB 1000 mg/200 mL premix     1,000 mg 200 mL/hr over 60 Minutes Intravenous  Once 11/20/15 0035 11/20/15 0455      Subjective:    Alexa [name] no complains  Objective:    Filed Vitals:   11/20/15 1412 11/20/15 1709 11/20/15 2100 11/21/15 0945  BP: 139/77 166/81 140/78 135/68  Pulse: 104 91 93 91  Temp: 98.8 F (37.1 C)  98.2 F (36.8 C) 98.4 F (36.9 C)  TempSrc: Oral  Axillary Oral  Resp: '20 18  20  '$ Height:      Weight:      SpO2: 95% 90% 99% 99%    Intake/Output Summary (Last 24 hours) at 11/21/15 1251 Last data filed at 11/21/15 0844  Gross per 24 hour  Intake    120 ml  Output      0 ml  Net    120 ml   Filed Weights   11/19/15 2145 11/20/15 0158  Weight: 63.504 kg (140 lb) 72.7 kg (160 lb 4.4 oz)    Exam: Gen:  NAD Cardiovascular:  RRR. Chest and lungs:   CTAB Abdomen:  Abdomen soft, NT/ND, + BS Extremities:  Cannot externally rotate her hip due to stiff ness form the patient   Data Reviewed:    Labs: Basic Metabolic Panel:  Recent Labs Lab 11/19/15 2209  NA 144  K 3.9  CL 106  CO2 27  GLUCOSE 154*  BUN 22*  CREATININE 1.27*  CALCIUM 8.7*   GFR Estimated Creatinine Clearance: 37.1 mL/min (by C-G formula based on Cr of 1.27). Liver Function Tests:  Recent Labs Lab 11/19/15 2209  AST 22  ALT 19  ALKPHOS 98  BILITOT 0.7  PROT 6.3*  ALBUMIN 2.8*   No results for input(s): LIPASE, AMYLASE in the last 168 hours. No results for input(s): AMMONIA in the last 168 hours. Coagulation profile No results for input(s): INR, PROTIME in the last 168 hours.  CBC:  Recent Labs Lab 11/19/15 2209  WBC 8.3  HGB 11.7*  HCT 35.3*  MCV 87.8  PLT 253     Cardiac Enzymes: No results for input(s): CKTOTAL, CKMB, CKMBINDEX, TROPONINI in the last 168 hours. BNP (last 3 results) No results for input(s): PROBNP in the last 8760 hours. CBG: No results for input(s): GLUCAP in the last 168 hours. D-Dimer: No results for input(s): DDIMER in the last 72 hours. Hgb A1c: No results for input(s): HGBA1C in the last 72 hours. Lipid Profile: No results for input(s): CHOL, HDL, LDLCALC, TRIG, CHOLHDL, LDLDIRECT in the last 72 hours. Thyroid function studies: No results for input(s): TSH, T4TOTAL, T3FREE, THYROIDAB in the last 72 hours.  Invalid input(s): FREET3 Anemia work up: No results for input(s): VITAMINB12, FOLATE, FERRITIN, TIBC, IRON, RETICCTPCT in the last 72 hours. Sepsis Labs:  Recent Labs Lab 11/19/15 2209 11/19/15 2210 11/20/15  0058  WBC 8.3  --   --   LATICACIDVEN  --  0.92 0.81   Microbiology Recent Results (from the past 240 hour(s))  Blood culture (routine x 2)     Status: None (Preliminary result)   Collection Time: 11/19/15 10:19 PM  Result Value Ref Range Status   Specimen Description BLOOD RIGHT ARM  Final   Special Requests BOTTLES DRAWN AEROBIC AND ANAEROBIC 5CC  Final   Culture NO GROWTH 1 DAY  Final   Report Status PENDING  Incomplete  Blood culture (routine x 2)     Status: None (Preliminary result)   Collection Time: 11/19/15 10:25 PM  Result Value Ref Range Status   Specimen Description BLOOD RIGHT HAND  Final   Special Requests BOTTLES DRAWN AEROBIC AND ANAEROBIC 5CC  Final   Culture NO GROWTH 1 DAY  Final   Report Status PENDING  Incomplete     Medications:   . amLODipine  2.5 mg Oral Daily  . collagenase   Topical Daily  . enoxaparin (LOVENOX) injection  40 mg Subcutaneous Q24H  . furosemide  40 mg Oral Daily  . irbesartan  300 mg Oral Daily  . levothyroxine  137 mcg Oral QAC breakfast  . morphine  30 mg Oral Q12H  . pantoprazole  40 mg Oral Daily  . potassium chloride SA  20 mEq Oral Daily   . risperiDONE  0.5 mg Oral QHS  . silver sulfADIAZINE  1 application Topical Daily  . sodium chloride  250 mL Intravenous Once   Continuous Infusions:   Time spent: 15 min   LOS: 1 day   Charlynne Cousins  [facility] Pager [phone]  *Please refer to [email or web address], password [email or web address] to get updated schedule on who will round on this patient, as hospitalists switch teams weekly. If 7PM-7AM, please contact night-coverage at [email or web address], password [email or web address] for any overnight needs.  11/21/2015, 12:51 PM

## 2015-11-21 NOTE — Clinical Social Work Note (Signed)
Clinical Social Worker met with patient and pt's dtr, Diane Bower present at bedside in reference to post-acute placement. CSW introduced CSW role and residential hospice placement process. Pt's family agreeable to residential hospice placement as recommended by Palliative Care Team. Family prefers [facility]. CSW explained that currently [facility] is full. Pt's dtr, Diane then expressed interest in [place].   CSW made referrals to both [place]. Patient is first of list for admissions at [facility] if bed becomes available.   Pt's dtr pleased with care at [facility] and appointed pt's dtr, [name] as main contact. Pt's dtr/family supportive and involved in pt's care. Pt disoriented and lying in bed alert. Pt's dtr stated "I never thought of this happening in a million years".CSW extended emotional support. Pt's dtr pleasant and appreciated social work intervention.  No further concerns reported by family at this time. CSW will continue to follow pt and pt's family for continued support and to facilitate pt's discharge needs once medically stable.   CSW remains available as needed.   Glendon Axe, MSW, LCSWA [phone] 11/21/2015 5:25 PM

## 2015-11-21 NOTE — Consult Note (Signed)
Consultation Note Date: 11/21/2015   Patient Name: Alexa [name]  DOB: October 16, 1936  MRN: [id number]  Age / Sex: 79 y.o., female   PCP: Laurey Morale, MD Referring Physician: Charlynne Cousins, MD  Reason for Consultation: Establishing goals of care and Psychosocial/spiritual support  Palliative Care Assessment and Plan Summary of Established Goals of Care and Medical Treatment Preferences   LATE ENTRY D/T EPIC DOWN  Clinical Assessment/Narrative: Alexa [name] is resting quietly in bed while her daughters [name] and Diane meet with me. Daughter, [name], is participating via phone.  Alexa [name]'s daughters tell me her story of dementia with behaviors.  They share their exhaustion in caring for her, and the difficulties in watching her recent decline (last 3 weeks).  She comes to the hospital today d/t her immobility and frequent falls, which have increased over the last 3 weeks.  They share that she is no longer feeding herself and that she is no longer able to walk, but will stand and shuffle her feet.    Family tells me that they want to focus on comfort.  They would not have any surgeries or difficult treatments, but they would like to know if there is some specific cause for this change, in effect, so she might qualify for Hospice. They share that they have been declined by [facility] on two occasions (albumin too high).  We talk about Hospice in the home and they share that even with support and equipment, they feel they can no longer care for her.  Daughters state several time that they feel she may have broken her hip or pelvis. We talk about the decline that can occur with illness or injury.   We talk about logistics of home with Hospice, SNF with Hospice, and in patient Hospice.  We also discuss SNF for rehab, SNF for residential.  We talk about up front costs for residential SNF if she does not qualify for rehab.   Daughters just got the paperwork for her Medicaid today and  have not completed.   This family will need support to help guide them through this difficult process.  They share that they are open to what ever help they can get to care for their much loved mother.   Contacts/Participants in Discussion: Primary Decision Maker: Mrs. Band is unable to make decisions d/t dementia.  Daughters Clovia Cuff and Diane make decisions as a team.    Code Status/Advance Care Planning:  DNR  Daughters share that they feel they can not care for Mrs. Taussig in the home any longer d/t her mobility and incontinence issues. (even with the help of hospice and equipment).   Denied services by [place] twice per daughters. (albumin too high)  They are seeking placement of any kind-SNF for rehab vs residential, including in patient Hospice as appropriate.    We discuss concepts such as DO NOT RE HOSPITALIZE and DO NOT TREAT THE NEXT INFECTION.   Symptom Management:   MS Contin ER 30 mg PO BID   Percocet 7.5/325 mg PO Q 4 hours PRN  Ativan 0.5 mg PO Q 8 hours PRN  Zofran 4 mg PO/IV Q 6 hours PRN  Palliative Prophylaxis: Colace 100 mg PO BID   Psycho-social/Spiritual:   Support System: Lives in her own home, 3 daughters as caregivers, but they have poor health.   Desire for further Chaplaincy support:no  Prognosis: Unable to determine, likely 6 months or less.   Discharge Planning:  Family is seeking placement at this time.  They tell me they are open to SNF residental or in patient hospice if appropriate.        Chief Complaint:  Fall and increasing confusion. History of Present Illness: Alexa [name] is a 79 y.o. female history of advanced dementia, diastolic CHF, history of PE not on anticoagulation secondary to intracranial hemorrhage, hypertension hypothyroidism was brought to the ER after patient had a fall at her house. Patient also was found to be increasingly confused. As per patient's daughter who provided the history patient  has been having falls recently and has found it increasingly difficult to walk. In the patient was found to be febrile. Left lower extremity has a wound which is draining. CT of the head also shows possible infarct which could be subacute to chronic. On exam patient is not in distress and left lower extremity anterior shin has an ulcer which measures around 5 cm in diameter. At this time patient has been started on antibiotics for wound infection. Patient's daughter at this time wants only comfort measures and treatment for infection and no aggressive measures including further tests. It was also found that family is finding it increasingly difficult to manage patient at home and would probably need nursing home placement.    Primary Diagnoses  Present on Admission:  . Acute encephalopathy . Hypothyroidism . Essential hypertension . Dementia with behavioral disturbance  Palliative Review of Systems: [name] has complaints of intense pain when she is turned.  She is unable to rate this pain, which decreases when she is allowed to stay in one position for several minutes.  I have reviewed the medical record, interviewed the patient and family, and examined the patient. The following aspects are pertinent.  Past Medical History  Diagnosis Date  . Alzheimer disease   . Hyperlipidemia     a. Myalgias with multiple statins.  . Hypertension   . GERD (gastroesophageal reflux disease)   . Diverticulosis   . Hx of adenomatous colonic polyps   . DVT (deep venous thrombosis) ([place]) 09-30-11; 01/2014    RLE; RLE  . Hypothyroidism   . Asthma   . History of tobacco abuse     a. quit 25+ years ago. PFTs in 2/15 were relatively normal.   . Chronic diastolic CHF (congestive heart failure) ([place])     a. ETT-myoview (9/12) with 4' exercise, EF 68%, no ischemia or infarction. b. Echo (10/14) with EF 55-60%, mod diast dysfunction, normal RV size/fcn, mod pulm HTN.   . Venous insufficiency     a. H/o  venous stasis ulceration.   Marland Kitchen PAF (paroxysmal atrial fibrillation) ([place])     a. Only episode that noted was in setting of right upper lobectomy in 2014. She was briefly on amiodarone but stopped it on her own. She is not coumadin. If recurrent AF, will need to consider.  . Pulmonary HTN ([place])     a. Moderate by echo 08/2013.  Marland Kitchen Cerebral infarct ([place])     a.  Remote anterior left frontal lobe infarct by CT head 01/2014.  . PE (pulmonary embolism) 01/2014  . Pneumonia     "couple times maybe" (12/13/2014)  . Arthritis     "qwhere"  . DDD (degenerative disc disease)     a. cervical and lumbar herniation and DJD s/p multiple diskectomies and other back surgeries from 2002-2008   . Chronic lower back pain   . Alzheimer's dementia     a. sees  Dr. Brett Fairy. b. on Aricept.; "stage 6" (12/13/2014)  . Kidney stones   . breast dx'd 2002    left  . Adenocarcinoma of breast ([place])     a. s/p right simple and left modified radical mastectomy with chemotherapy and radiation in 2002.   . Non-small cell lung cancer ([place])     a. s/p right upper lobectomy in 2014.   Marland Kitchen ADENOCARCINOMA, BREAST, BILATERAL, HX OF 02/22/2008    Qualifier: Diagnosis of  By: Niel Hummer MD, [place], RIGHT SHOULDER 12/15/2010    Qualifier: Diagnosis of  By: Niel Hummer MD, Lorinda Creed   . SAH (subarachnoid hemorrhage) ([place]) 03/12/2014  . Ventricular tachycardia ([place]) 02/28/2014    4 beat run ventricular tachycardia in the hospital, February 28, 2014    Social History   Social History  . Marital Status: Widowed    Spouse Name: N/A  . Number of Children: 3  . Years of Education: 11   Occupational History  .     Social History Main Topics  . Smoking status: Former Smoker -- 1.00 packs/day for 25 years    Types: Cigarettes    Start date: 06/04/1959    Quit date: 05/10/1984  . Smokeless tobacco: Never Used  . Alcohol Use: No  . Drug Use: No  . Sexual Activity: No   Other Topics Concern  . None   Social  History Narrative   ** Merged History Encounter **       Patient is widowed. Patient is right-handed. Patient is retired. Patient has an 11th grade education. Patient has three daughters.   Family History  Problem Relation Age of Onset  . Diabetes Daughter   . Diabetes Daughter   . Heart disease Father   . Heart disease Brother   . Dementia Brother   . Heart disease Sister   . Cancer Sister     breast  . Colon cancer Daughter 52  . Colon polyps Daughter   . Stroke Mother   . Liver disease Daughter   . Heart attack    . Cancer Sister     breast   Scheduled Meds: . amLODipine  2.5 mg Oral Daily  . collagenase   Topical Daily  . enoxaparin (LOVENOX) injection  40 mg Subcutaneous Q24H  . furosemide  40 mg Oral Daily  . irbesartan  300 mg Oral Daily  . levothyroxine  137 mcg Oral QAC breakfast  . morphine  30 mg Oral Q12H  . pantoprazole  40 mg Oral Daily  . potassium chloride SA  20 mEq Oral Daily  . risperiDONE  0.5 mg Oral QHS  . silver sulfADIAZINE  1 application Topical Daily  . sodium chloride  250 mL Intravenous Once   Continuous Infusions:  PRN Meds:.acetaminophen **OR** acetaminophen, dicyclomine, docusate sodium, haloperidol lactate, LORazepam, ondansetron **OR** ondansetron (ZOFRAN) IV, oxyCODONE-acetaminophen, temazepam Medications Prior to Admission:  Prior to Admission medications   Medication Sig Start Date End Date Taking? Authorizing Provider  amLODipine (NORVASC) 2.5 MG tablet Take 1 tablet (2.5 mg total) by mouth daily. 08/29/15  Yes Scott T Kathlen Mody, PA-C  cephALEXin (KEFLEX) 250 MG/5ML suspension Take 10 mLs (500 mg total) by mouth 4 (four) times daily. 11/18/15  Yes Laurey Morale, MD  dicyclomine (BENTYL) 10 MG capsule Take 1 capsule (10 mg total) by mouth 2 (two) times daily as needed for spasms. 10/15/14  Yes Willia Craze, NP  docusate sodium (COLACE) 100 MG capsule  Take 1 capsule (100 mg total) by mouth 2 (two) times daily. Patient taking  differently: Take 100 mg by mouth 2 (two) times daily as needed for mild constipation.  03/21/15  Yes Laurey Morale, MD  esomeprazole (NEXIUM) 40 MG capsule Take 1 capsule (40 mg total) by mouth daily at 12 noon. 03/21/15  Yes Laurey Morale, MD  furosemide (LASIX) 40 MG tablet Take 40 mg by mouth daily.    Yes Historical Provider, MD  levothyroxine (SYNTHROID, LEVOTHROID) 137 MCG tablet TAKE 1 TABLET BY MOUTH EVERY DAY 08/22/15  Yes Laurey Morale, MD  LORazepam (ATIVAN) 0.5 MG tablet Take 1 tablet (0.5 mg total) by mouth every 8 (eight) hours as needed for anxiety. 09/08/15  Yes Laurey Morale, MD  morphine (MS CONTIN) 15 MG 12 hr tablet Take 1 tablet by mouth every 12 (twelve) hours. 09/24/15  Yes Historical Provider, MD  oxyCODONE-acetaminophen (PERCOCET) 7.5-325 MG per tablet Take 1 tablet by mouth every 4 (four) hours as needed for moderate pain or severe pain. 07/26/15  Yes Michael Boston, MD  potassium chloride SA (K-DUR,KLOR-CON) 20 MEQ tablet Take 20 mEq by mouth daily. 02/13/14  Yes Larey Dresser, MD  risperiDONE (RISPERDAL) 0.25 MG tablet TAKE 1 TABLET BY MOUTH AT BEDTIME Patient taking differently: TAKE 2 TABLETs BY MOUTH AT BEDTIME 09/29/15  Yes Laurey Morale, MD  silver sulfADIAZINE (SILVADENE) 1 % cream Apply 1 application topically daily. 11/18/15  Yes Laurey Morale, MD  temazepam (RESTORIL) 30 MG capsule TAKE ONE CAPSULE BY MOUTH AT BEDTIME AS NEEDED Patient taking differently: TAKE ONE CAPSULE BY MOUTH AT BEDTIME AS NEEDED FOR SLEEP. 11/11/15  Yes Laurey Morale, MD  valsartan (DIOVAN) 320 MG tablet Take 1 tablet (320 mg total) by mouth daily. 08/29/15  Yes Liliane Shi, PA-C   Allergies  Allergen Reactions  . Simvastatin Other (See Comments)    myalgia  . Statins Other (See Comments)    Myalgias with multiple statins   . Strawberry Extract Swelling    Swelling ,rash  . Strawberry Extract Hives  . Hydrocodone-Homatropine Nausea Only    No problems with oxycodone  . Nsaids Other  (See Comments)    unknown  . Penicillins Rash   CBC:    Component Value Date/Time   WBC 8.3 11/19/2015 2209   WBC 5.2 08/15/2015 1208   WBC 5.5 07/22/2015 1343   HGB 11.7* 11/19/2015 2209   HGB 13.4 08/15/2015 1208   HGB 13.0 07/22/2015 1343   HCT 35.3* 11/19/2015 2209   HCT 40.2 08/15/2015 1208   HCT 38.6 07/22/2015 1343   PLT 253 11/19/2015 2209   PLT 253 08/15/2015 1208   PLT 295 07/22/2015 1343   MCV 87.8 11/19/2015 2209   MCV 87 08/15/2015 1208   MCV 86.2 07/22/2015 1343   NEUTROABS 3.3 08/15/2015 1208   NEUTROABS 4.5 07/30/2015 1721   NEUTROABS 3.2 07/22/2015 1343   LYMPHSABS 1.3 08/15/2015 1208   LYMPHSABS 2.6 07/30/2015 1721   LYMPHSABS 1.7 07/22/2015 1343   MONOABS 0.6 07/30/2015 1721   MONOABS 0.6 07/22/2015 1343   EOSABS 0.1 08/15/2015 1208   EOSABS 0.2 07/30/2015 1721   EOSABS 0.1 07/22/2015 1343   BASOSABS 0.0 08/15/2015 1208   BASOSABS 0.1 07/30/2015 1721   BASOSABS 0.0 07/22/2015 1343   Comprehensive Metabolic Panel:    Component Value Date/Time   NA 144 11/19/2015 2209   NA 145 07/22/2015 1343   NA 140 10/03/2014 0844  K 3.9 11/19/2015 2209   K 3.9 07/22/2015 1343   K 3.5 10/03/2014 0844   CL 106 11/19/2015 2209   CL 100 10/03/2014 0844   CO2 27 11/19/2015 2209   CO2 27 07/22/2015 1343   CO2 27 10/03/2014 0844   BUN 22* 11/19/2015 2209   BUN 17.7 07/22/2015 1343   BUN 19 10/03/2014 0844   CREATININE 1.27* 11/19/2015 2209   CREATININE 0.9 07/22/2015 1343   CREATININE 1.4* 10/03/2014 0844   GLUCOSE 154* 11/19/2015 2209   GLUCOSE 90 07/22/2015 1343   GLUCOSE 87 10/03/2014 0844   CALCIUM 8.7* 11/19/2015 2209   CALCIUM 9.4 07/22/2015 1343   CALCIUM 8.9 10/03/2014 0844   AST 22 11/19/2015 2209   AST 44* 07/22/2015 1343   AST 14 10/03/2014 0844   ALT 19 11/19/2015 2209   ALT 56* 07/22/2015 1343   ALT 11 10/03/2014 0844   ALKPHOS 98 11/19/2015 2209   ALKPHOS 285* 07/22/2015 1343   ALKPHOS 108* 10/03/2014 0844   BILITOT 0.7 11/19/2015  2209   BILITOT 0.87 07/22/2015 1343   BILITOT 0.60 10/03/2014 0844   PROT 6.3* 11/19/2015 2209   PROT 7.3 07/22/2015 1343   PROT 7.5 10/03/2014 0844   ALBUMIN 2.8* 11/19/2015 2209   ALBUMIN 3.4* 07/22/2015 1343   ALBUMIN 3.5 10/03/2014 0844    Physical Exam: Vital Signs: BP 140/78 mmHg  Pulse 93  Temp(Src) 98.2 F (36.8 C) (Axillary)  Resp 18  Ht '5\' 5"'$  (1.651 m)  Wt 72.7 kg (160 lb 4.4 oz)  BMI 26.67 kg/m2  SpO2 99% SpO2: SpO2: 99 % O2 Device: O2 Device: Not Delivered O2 Flow Rate:   Intake/output summary:  Intake/Output Summary (Last 24 hours) at 11/21/15 8469 Last data filed at 11/21/15 0844  Gross per 24 hour  Intake    120 ml  Output      0 ml  Net    120 ml   LBM: Last BM Date:  (unknown) Baseline Weight: Weight: 63.504 kg (140 lb) Most recent weight: Weight: 72.7 kg (160 lb 4.4 oz)  Exam Findings:  Constitutional:  Frail, elderly,  Resp: even and non labored.  GI: abd soft, non tender.  Skin: small shearing tear to R buttock, R lower leg with dressing CDI.  Muscl: Left hip guarding with palpation/lifting of leg.           Palliative Performance Scale:  PPS 3 months ago: 60% PPS 1 month ago: 50% PPS at this time: 30%             Additional Data Reviewed: Recent Labs     11/19/15  2209  WBC  8.3  HGB  11.7*  PLT  253  NA  144  BUN  22*  CREATININE  1.27*     Time In: 1430 Time Out: 1600 Time Total:  90 minutes Greater than 50%  of this time was spent counseling and coordinating care related to the above assessment and plan. GOC discussion shared with nursing staff, SW, and Dr. Olevia Bowens.   Signed by: Drue Novel, NP  Drue Novel, NP  11/21/2015, 9:21 AM  Please contact Palliative Medicine Team phone at [phone] for questions and concerns.

## 2015-11-21 NOTE — Progress Notes (Signed)
Daily Progress Note   Patient Name: Alexa [name]       Date: 11/21/2015 DOB: 01/12/1936  Age: 79 y.o. MRN#: [id number] Attending Physician: Charlynne Cousins, MD Primary Care Physician: Laurey Morale, MD Admit Date: 11/19/2015  Reason for Consultation/Follow-up: Disposition, Establishing goals of care, Hospice Evaluation, Inpatient hospice referral, Non pain symptom management, Pain control and Psychosocial/spiritual support  Subjective: Pt cont to decline. She answers with one word answers. She denies pain, but cries out with any touch to LE. Family concerned that she had sustained a hip fracture from multiple falls. She has been living with her daughters since her dementia dx approx 2years ago. They have seen a sharp decline in 3 weeks: now not walking, new acute pain with any movement, new incontinence, not eating and when she does take a bite or sips has to be cued to swallow and be fed. They agreed to admission to see if an underlying infection was involved in her precipitous decline but despite IVF and abx she has not returned to baseline.  Her albumin on 9/16 was 4.0 and on 11/19/15 is now 2.8 Weight on 5/16 176 lbs                   9/16 172 lbs                   11/16 164 lbs                    11/19/15 150 Length of Stay: 1 day  Current Medications: Scheduled Meds:  . amLODipine  2.5 mg Oral Daily  . collagenase   Topical Daily  . enoxaparin (LOVENOX) injection  40 mg Subcutaneous Q24H  . furosemide  40 mg Oral Daily  . irbesartan  300 mg Oral Daily  . levothyroxine  137 mcg Oral QAC breakfast  . morphine  30 mg Oral Q12H  . pantoprazole  40 mg Oral Daily  . potassium chloride SA  20 mEq Oral Daily  . risperiDONE  0.5 mg Oral QHS  . silver sulfADIAZINE  1 application  Topical Daily  . sodium chloride  250 mL Intravenous Once    Continuous Infusions:    PRN Meds: acetaminophen **OR** acetaminophen, dicyclomine, docusate sodium, haloperidol lactate, LORazepam, ondansetron **OR** ondansetron (ZOFRAN) IV,  oxyCODONE-acetaminophen, temazepam  Physical Exam: Physical Exam  Constitutional: She appears well-nourished.  Pulmonary/Chest: Effort normal.  Neurological:  lethargic  Skin: Skin is warm.  Psychiatric:  Anxious agitated when touch.                 Vital Signs: BP 135/68 mmHg  Pulse 91  Temp(Src) 98.4 F (36.9 C) (Oral)  Resp 20  Ht '5\' 5"'$  (1.651 m)  Wt 72.7 kg (160 lb 4.4 oz)  BMI 26.67 kg/m2  SpO2 99% SpO2: SpO2: 99 % O2 Device: O2 Device: Not Delivered O2 Flow Rate:    Intake/output summary:  Intake/Output Summary (Last 24 hours) at 11/21/15 1304 Last data filed at 11/21/15 0844  Gross per 24 hour  Intake    120 ml  Output      0 ml  Net    120 ml   LBM: Last BM Date:  (unknown) Baseline Weight: Weight: 63.504 kg (140 lb) Most recent weight: Weight: 72.7 kg (160 lb 4.4 oz)       Palliative Assessment/Data: Flowsheet Rows        Most Recent Value   Intake Tab    Referral Department  Hospitalist   Unit at Time of Referral  Med/Surg Unit   Palliative Care Primary Diagnosis  Neurology   Date Notified  11/20/15   Palliative Care Type  New Palliative care   Reason for referral  Clarify Goals of Care, Counsel Regarding Hospice   Date of Admission  11/19/15   Date first seen by Palliative Care  11/20/15   # of days Palliative referral response time  0 Day(s)   # of days IP prior to Palliative referral  1   Clinical Assessment    Palliative Performance Scale Score  20%   Pain Max last 24 hours  Other (Comment)   Pain Min Last 24 hours  Other (Comment)   Dyspnea Max Last 24 Hours  Other (Comment)   Dyspnea Min Last 24 hours  Other (Comment)   Nausea Max Last 24 Hours  Other (Comment)   Nausea Min Last 24 Hours  Other  (Comment)   Anxiety Max Last 24 Hours  Other (Comment)   Anxiety Min Last 24 Hours  Other (Comment)   Other Max Last 24 Hours  Other (Comment)   Psychosocial & Spiritual Assessment    Palliative Care Outcomes    Palliative Care Outcomes  Improved pain interventions, Clarified goals of care, Counseled regarding hospice, Changed to focus on comfort, Transitioned to hospice   Patient/Family wishes: Interventions discontinued/not started   Mechanical Ventilation, NIPPV, Trach, BiPAP, Hemodialysis, PEG, Transfusion, Vasopressors, Antibiotics, Tube feedings/TPN   Palliative Care follow-up planned  Yes, Facility      Additional Data Reviewed: CBC    Component Value Date/Time   WBC 8.3 11/19/2015 2209   WBC 5.2 08/15/2015 1208   WBC 5.5 07/22/2015 1343   RBC 4.02 11/19/2015 2209   RBC 4.63 08/15/2015 1208   RBC 4.48 07/22/2015 1343   HGB 11.7* 11/19/2015 2209   HGB 13.4 08/15/2015 1208   HGB 13.0 07/22/2015 1343   HCT 35.3* 11/19/2015 2209   HCT 40.2 08/15/2015 1208   HCT 38.6 07/22/2015 1343   PLT 253 11/19/2015 2209   PLT 253 08/15/2015 1208   PLT 295 07/22/2015 1343   MCV 87.8 11/19/2015 2209   MCV 87 08/15/2015 1208   MCV 86.2 07/22/2015 1343   MCH 29.1 11/19/2015 2209   MCH 28.9 08/15/2015 1208   MCH  29.0 07/22/2015 1343   MCHC 33.1 11/19/2015 2209   MCHC 33.3 08/15/2015 1208   MCHC 33.7 07/22/2015 1343   RDW 13.9 11/19/2015 2209   RDW 13.9 08/15/2015 1208   RDW 13.6 07/22/2015 1343   LYMPHSABS 1.3 08/15/2015 1208   LYMPHSABS 2.6 07/30/2015 1721   LYMPHSABS 1.7 07/22/2015 1343   MONOABS 0.6 07/30/2015 1721   MONOABS 0.6 07/22/2015 1343   EOSABS 0.1 08/15/2015 1208   EOSABS 0.2 07/30/2015 1721   EOSABS 0.1 07/22/2015 1343   BASOSABS 0.0 08/15/2015 1208   BASOSABS 0.1 07/30/2015 1721   BASOSABS 0.0 07/22/2015 1343    CMP     Component Value Date/Time   NA 144 11/19/2015 2209   NA 145 07/22/2015 1343   NA 140 10/03/2014 0844   K 3.9 11/19/2015 2209   K 3.9  07/22/2015 1343   K 3.5 10/03/2014 0844   CL 106 11/19/2015 2209   CL 100 10/03/2014 0844   CO2 27 11/19/2015 2209   CO2 27 07/22/2015 1343   CO2 27 10/03/2014 0844   GLUCOSE 154* 11/19/2015 2209   GLUCOSE 90 07/22/2015 1343   GLUCOSE 87 10/03/2014 0844   BUN 22* 11/19/2015 2209   BUN 17.7 07/22/2015 1343   BUN 19 10/03/2014 0844   CREATININE 1.27* 11/19/2015 2209   CREATININE 0.9 07/22/2015 1343   CREATININE 1.4* 10/03/2014 0844   CALCIUM 8.7* 11/19/2015 2209   CALCIUM 9.4 07/22/2015 1343   CALCIUM 8.9 10/03/2014 0844   PROT 6.3* 11/19/2015 2209   PROT 7.3 07/22/2015 1343   PROT 7.5 10/03/2014 0844   ALBUMIN 2.8* 11/19/2015 2209   ALBUMIN 3.4* 07/22/2015 1343   ALBUMIN 3.5 10/03/2014 0844   AST 22 11/19/2015 2209   AST 44* 07/22/2015 1343   AST 14 10/03/2014 0844   ALT 19 11/19/2015 2209   ALT 56* 07/22/2015 1343   ALT 11 10/03/2014 0844   ALKPHOS 98 11/19/2015 2209   ALKPHOS 285* 07/22/2015 1343   ALKPHOS 108* 10/03/2014 0844   BILITOT 0.7 11/19/2015 2209   BILITOT 0.87 07/22/2015 1343   BILITOT 0.60 10/03/2014 0844   GFRNONAA 40* 11/19/2015 2209   GFRAA 46* 11/19/2015 2209       Problem List:  Patient Active Problem List   Diagnosis Date Noted  . Palliative care encounter   . DNR (do not resuscitate) discussion   . Mental status, decreased 11/20/2015  . Acute encephalopathy 11/20/2015  . Wound of left leg 11/20/2015  . Cellulitis of left lower leg   . Anxiety state 09/08/2015  . Appendicitis, acute 07/24/2015  . Acute appendicitis s/p lap appy 07/25/2015 07/24/2015  . Dementia with behavioral disturbance 04/29/2015  . CAP (community acquired pneumonia) 12/13/2014  . SOB (shortness of breath) 12/13/2014  . Generalized abdominal pain   . UTI (urinary tract infection) 06/08/2014  . Acute pulmonary embolism ([place]) 02/28/2014  . Right ventricular dysfunction 02/28/2014  . Chest pain 02/27/2014  . Dehydration 02/25/2014  . AKI (acute kidney injury) ([place])  02/25/2014  . Diastolic CHF, chronic ([place]) 02/25/2014  . Hypokalemia 05/20/2013  . Atrial fibrillation ([place]) 05/20/2013  . Non-small cell cancer of right lung ([place]) 09/15/2011  . Dyspnea 08/09/2011  . LEG PAIN 12/30/2009  . EDEMA 12/30/2009  . NONSPECIFIC ABN FINDNG RAD&OTH EXAM BILARY TRCT 09/01/2009  . PERSONAL HX COLONIC POLYPS 09/01/2009  . GERD 07/29/2009  . BACK PAIN, CHRONIC 05/22/2009  . VITAMIN D DEFICIENCY 02/12/2009  . ANEMIA 02/12/2009  . PERIPHERAL NEUROPATHY 02/12/2009  .  NECK PAIN, RIGHT 02/16/2008  . Hypothyroidism 08/24/2007  . HYPERLIPIDEMIA 06/06/2007  . Essential hypertension 06/06/2007  . ARTHRITIS, HX OF 06/06/2007     Palliative Care Assessment & Plan    1.Code Status:  DNR    Code Status Orders        Start     Ordered   11/20/15 0259  Do not attempt resuscitation (DNR)   Continuous    Question Answer Comment  In the event of cardiac or respiratory ARREST Do not call a "code blue"   In the event of cardiac or respiratory ARREST Do not perform Intubation, CPR, defibrillation or ACLS   In the event of cardiac or respiratory ARREST Use medication by any route, position, wound care, and other measures to relive pain and suffering. May use oxygen, suction and manual treatment of airway obstruction as needed for comfort.      11/20/15 0259       2. Goals of Care/Additional Recommendations:  Full comfort care  Even if pt has sustained a fx they would not want surgical repair  Understand that hospice will not offer IVF, artificial feeding, transfusions, further lab draws and are in full agreement  Desire admission for in-pt hospice for EOL care  Limitations on Scope of Treatment:   Desire for further Chaplaincy support:no  Psycho-social Needs: Grief/Bereavement Support  3. Symptom Management:      1.Pain : Will DC MS Contin as pt is at high risk to chew her pills. Will start scheduled morphine concentrate at '5mg'$  q4 atc and 5-10 q2  prn 2. Agitation: Will order both PO and add IV ativan for anxiety/agitation, cont haldol '2mg'$  q6 prn agitation and scheduled risperdol 0.5 qhs./ 3. Constipation: Will order dulcolax supp QOD as well as senna 2 qhs   4. Palliative Prophylaxis:   Aspiration, Bowel Regimen, Delirium Protocol, Frequent Pain Assessment, Oral Care, Palliative Wound Care and Turn Reposition  5. Prognosis: < 4 weeks  6. Discharge Planning:  Hospice facility   Care plan was discussed with Dr. Olevia Bowens  Thank you for allowing the Palliative Medicine Team to assist in the care of this patient.   Time In: 1215 Time Out: 1300 Total Time 45 Prolonged Time Billed  no         Dory Horn, NP  11/21/2015, 1:04 PM  Please contact Palliative Medicine Team phone at [phone] for questions and concerns.

## 2015-11-21 NOTE — Progress Notes (Signed)
Nutrition Brief Note  Patient identified on the Malnutrition Screening Tool (MST) Report  Wt Readings from Last 15 Encounters:  11/20/15 160 lb 4.4 oz (72.7 kg)  10/29/15 164 lb (74.39 kg)  10/15/15 162 lb (73.483 kg)  08/29/15 172 lb (78.019 kg)  08/15/15 174 lb (78.926 kg)  07/30/15 170 lb (77.111 kg)  04/29/15 176 lb (79.833 kg)  03/21/15 181 lb (82.101 kg)  02/14/15 176 lb (79.833 kg)  12/14/14 175 lb 11.3 oz (79.7 kg)  10/30/14 173 lb (78.472 kg)  10/15/14 171 lb (77.565 kg)  10/03/14 169 lb (76.658 kg)  09/23/14 170 lb (77.111 kg)  09/17/14 167 lb (75.751 kg)    Body mass index is 26.67 kg/(m^2). Patient meets criteria for Overweight based on current BMI.  Current diet order is Dysphagia 1 with nectar-thick liquids. Per chart, pt is full comfort care, plan for inpatient hospice and EOL care ;will not offer IVF or artificial feeding. Labs and medications reviewed.   No nutrition interventions warranted at this time. If nutrition issues arise, please consult RD.   Scarlette Ar RD, LDN Inpatient Clinical Dietitian Pager: [phone] After Hours Pager: [phone]

## 2015-11-22 DIAGNOSIS — I10 Essential (primary) hypertension: Secondary | ICD-10-CM

## 2015-11-22 DIAGNOSIS — G9340 Encephalopathy, unspecified: Secondary | ICD-10-CM

## 2015-11-22 DIAGNOSIS — F0391 Unspecified dementia with behavioral disturbance: Secondary | ICD-10-CM

## 2015-11-22 NOTE — Clinical Social Work Note (Signed)
Clinical Social Worker spoke with pt's dtr, [name] in reference to bed offers for residential hospice facilities.   [facility] has extended bed offer however facility currently does not have bed availability. Patient is first on waiting list when bed becomes available. [facility] received referral yesterday, 12/23 and to visit family at bedside today, 12/24. Pt's dtr, [name] reported that she has not heard from [facility] at this time however will remain at bedside.   CSW to follow up with [facility].   CSW remains available as needed.   Glendon Axe, MSW, [place] [phone] 11/22/2015 12:27 PM

## 2015-11-22 NOTE — Progress Notes (Signed)
[facility] PROGRESS NOTE  Alexa [name] JGG:[id number] DOB: 05/21/1936 DOA: 11/19/2015  PCP: Laurey Morale, MD  Brief HPI: 79 year old Caucasian female with past medical history of advanced dementia, diastolic CHF, history of PE, not on anticoagulation, hypertension, was brought in after a fall at home. Over the last few weeks. Patient had been increasingly confused per patient's family. She was seen by her neurologist recently and was thought to have end-stage dementia. She was thought to have findings suggestive of lower extremity cellulitis. She was hospitalized for further management.  Past medical history:  Past Medical History  Diagnosis Date  . Alzheimer disease   . Hyperlipidemia     a. Myalgias with multiple statins.  . Hypertension   . GERD (gastroesophageal reflux disease)   . Diverticulosis   . Hx of adenomatous colonic polyps   . DVT (deep venous thrombosis) ([place]) 09-30-11; 01/2014    RLE; RLE  . Hypothyroidism   . Asthma   . History of tobacco abuse     a. quit 25+ years ago. PFTs in 2/15 were relatively normal.   . Chronic diastolic CHF (congestive heart failure) ([place])     a. ETT-myoview (9/12) with 4' exercise, EF 68%, no ischemia or infarction. b. Echo (10/14) with EF 55-60%, mod diast dysfunction, normal RV size/fcn, mod pulm HTN.   . Venous insufficiency     a. H/o venous stasis ulceration.   Marland Kitchen PAF (paroxysmal atrial fibrillation) ([place])     a. Only episode that noted was in setting of right upper lobectomy in 2014. She was briefly on amiodarone but stopped it on her own. She is not coumadin. If recurrent AF, will need to consider.  . Pulmonary HTN ([place])     a. Moderate by echo 08/2013.  Marland Kitchen Cerebral infarct ([place])     a.  Remote anterior left frontal lobe infarct by CT head 01/2014.  . PE (pulmonary embolism) 01/2014  . Pneumonia     "couple times maybe" (12/13/2014)  . Arthritis     "qwhere"  . DDD (degenerative disc disease)     a. cervical and  lumbar herniation and DJD s/p multiple diskectomies and other back surgeries from 2002-2008   . Chronic lower back pain   . Alzheimer's dementia     a. sees Dr. Brett Fairy. b. on Aricept.; "stage 6" (12/13/2014)  . Kidney stones   . breast dx'd 2002    left  . Adenocarcinoma of breast ([place])     a. s/p right simple and left modified radical mastectomy with chemotherapy and radiation in 2002.   . Non-small cell lung cancer ([place])     a. s/p right upper lobectomy in 2014.   Marland Kitchen ADENOCARCINOMA, BREAST, BILATERAL, HX OF 02/22/2008    Qualifier: Diagnosis of  By: Niel Hummer MD, [place], RIGHT SHOULDER 12/15/2010    Qualifier: Diagnosis of  By: Niel Hummer MD, Lorinda Creed   . SAH (subarachnoid hemorrhage) ([place]) 03/12/2014  . Ventricular tachycardia ([place]) 02/28/2014    4 beat run ventricular tachycardia in the hospital, February 28, 2014     Consultants: Palliative medicine  Procedures: None  Antibiotics: None  Subjective: Patient is pleasantly confused. Her daughter is at the bedside. She's had very poor oral intake. Doesn't communicate.  Objective: Vital Signs  Filed Vitals:   11/21/15 0945 11/21/15 2105 11/22/15 0938 11/22/15 1100  BP: 135/68 172/81 141/77 137/66  Pulse: 91 109 90   Temp: 98.4 F (36.9  C) 97.9 F (36.6 C) 98.1 F (36.7 C)   TempSrc: Oral Oral Oral   Resp: '20 18 20   '$ Height:      Weight:      SpO2: 99% 90% 99%    No intake or output data in the 24 hours ending 11/22/15 1633 Filed Weights   11/19/15 2145 11/20/15 0158  Weight: 63.504 kg (140 lb) 72.7 kg (160 lb 4.4 oz)    General appearance: alert, distracted, appears stated age and no distress Resp: clear to auscultation bilaterally Cardio: regular rate and rhythm, S1, S2 normal, no murmur, click, rub or gallop GI: soft, non-tender; bowel sounds normal; no masses,  no organomegaly Extremities: extremities normal, atraumatic, no cyanosis or edema Neurologic: Pleasantly confused. No focal  deficits.  Lab Results:  Basic Metabolic Panel:  Recent Labs Lab 11/19/15 2209  NA 144  K 3.9  CL 106  CO2 27  GLUCOSE 154*  BUN 22*  CREATININE 1.27*  CALCIUM 8.7*   Liver Function Tests:  Recent Labs Lab 11/19/15 2209  AST 22  ALT 19  ALKPHOS 98  BILITOT 0.7  PROT 6.3*  ALBUMIN 2.8*   CBC:  Recent Labs Lab 11/19/15 2209  WBC 8.3  HGB 11.7*  HCT 35.3*  MCV 87.8  PLT 253   BNP (last 3 results)  Recent Labs  12/13/14 1435  BNP 568.1*     Recent Results (from the past 240 hour(s))  Blood culture (routine x 2)     Status: None (Preliminary result)   Collection Time: 11/19/15 10:19 PM  Result Value Ref Range Status   Specimen Description BLOOD RIGHT ARM  Final   Special Requests BOTTLES DRAWN AEROBIC AND ANAEROBIC 5CC  Final   Culture NO GROWTH 2 DAYS  Final   Report Status PENDING  Incomplete  Blood culture (routine x 2)     Status: None (Preliminary result)   Collection Time: 11/19/15 10:25 PM  Result Value Ref Range Status   Specimen Description BLOOD RIGHT HAND  Final   Special Requests BOTTLES DRAWN AEROBIC AND ANAEROBIC 5CC  Final   Culture NO GROWTH 2 DAYS  Final   Report Status PENDING  Incomplete      Studies/Results: No results found.  Medications:  Scheduled: . amLODipine  2.5 mg Oral Daily  . bisacodyl  10 mg Rectal QODAY  . irbesartan  300 mg Oral Daily  . morphine CONCENTRATE  5 mg Sublingual 6 times per day  . risperiDONE  0.5 mg Oral QHS  . senna  2 tablet Oral QHS  . silver sulfADIAZINE  1 application Topical Daily   Continuous:  LYY:[id number] **OR** acetaminophen, haloperidol lactate, LORazepam, LORazepam, morphine CONCENTRATE, ondansetron **OR** ondansetron (ZOFRAN) IV  Assessment/Plan:  Principal Problem:   Acute encephalopathy Active Problems:   Hypothyroidism   Essential hypertension   Dementia with behavioral disturbance   Wound of left leg   Cellulitis of left lower leg   Palliative care encounter    DNR (do not resuscitate) discussion   Pressure ulcer   Severe dementia    Acute encephalopathy superimprosed on Dementia This is most likely end-stage dementia. Acute infection may have contributing to acute worsening, but not to a great extent. Patient was seen by palliative medicine. Full comfort care has been initiated. Plan is for discharge to a residential hospice facility.   Cellulitis of left lower leg  She was placed on antibiotics initially. Now she is full comfort care.  Chronic pain Continue morphine.  Chronic diastolic heart failure: Seems to be compensated. Continue Lasix.  Hypothyroidism Okay to stop her Synthroid considering comfort care status.  Essential hypertension Continue current regimen.  DVT Prophylaxis: No need for DVT prophylaxis due to comfort care    Code Status: DO NOT RESUSCITATE  Family Communication: Discussed with the daughter  Disposition Plan: Await placement to residential hospice     LOS: 2 days   [facility] Pager [phone] 11/22/2015, 4:33 PM  If 7PM-7AM, please contact night-coverage at [email or web address], password [email or web address]

## 2015-11-22 NOTE — Progress Notes (Signed)
Daily Progress Note   Patient Name: Alexa [name]       Date: 11/22/2015 DOB: 20-Jun-1936  Age: 79 y.o. MRN#: [id number] Attending Physician: Bonnielee Haff, MD Primary Care Physician: Laurey Morale, MD Admit Date: 11/19/2015  Reason for Consultation/Follow-up: Pain control and Psychosocial/spiritual support  Subjective: Only eating bites and sips. 1 Prn for pain. Doing better with scheduled MS04 Interval Events: Clinical decline Length of Stay: 2 days  Current Medications: Scheduled Meds:  . amLODipine  2.5 mg Oral Daily  . bisacodyl  10 mg Rectal QODAY  . irbesartan  300 mg Oral Daily  . morphine CONCENTRATE  5 mg Sublingual 6 times per day  . risperiDONE  0.5 mg Oral QHS  . senna  2 tablet Oral QHS  . silver sulfADIAZINE  1 application Topical Daily    Continuous Infusions:    PRN Meds: acetaminophen **OR** acetaminophen, haloperidol lactate, LORazepam, LORazepam, morphine CONCENTRATE, ondansetron **OR** ondansetron (ZOFRAN) IV  Physical Exam: Physical Exam  Constitutional:  Elderly ill appearing female  Neurological: She is alert.  Confused, minimal words  Skin: Skin is warm and dry.                Vital Signs: BP 137/66 mmHg  Pulse 90  Temp(Src) 98.1 F (36.7 C) (Oral)  Resp 20  Ht '5\' 5"'$  (1.651 m)  Wt 72.7 kg (160 lb 4.4 oz)  BMI 26.67 kg/m2  SpO2 99% SpO2: SpO2: 99 % O2 Device: O2 Device: Not Delivered O2 Flow Rate:    Intake/output summary: No intake or output data in the 24 hours ending 11/22/15 1359 LBM: Last BM Date:  (unknown) Baseline Weight: Weight: 63.504 kg (140 lb) Most recent weight: Weight: 72.7 kg (160 lb 4.4 oz)       Palliative Assessment/Data: Flowsheet Rows        Most Recent Value   Intake Tab    Referral Department   Hospitalist   Unit at Time of Referral  Med/Surg Unit   Palliative Care Primary Diagnosis  Neurology   Date Notified  11/20/15   Palliative Care Type  New Palliative care   Reason for referral  Clarify Goals of Care, Counsel Regarding Hospice   Date of Admission  11/19/15   Date first seen by Palliative Care  11/20/15   # of  days Palliative referral response time  0 Day(s)   # of days IP prior to Palliative referral  1   Clinical Assessment    Palliative Performance Scale Score  20%   Pain Max last 24 hours  Other (Comment)   Pain Min Last 24 hours  Other (Comment)   Dyspnea Max Last 24 Hours  Other (Comment)   Dyspnea Min Last 24 hours  Other (Comment)   Nausea Max Last 24 Hours  Other (Comment)   Nausea Min Last 24 Hours  Other (Comment)   Anxiety Max Last 24 Hours  Other (Comment)   Anxiety Min Last 24 Hours  Other (Comment)   Other Max Last 24 Hours  Other (Comment)   Psychosocial & Spiritual Assessment    Palliative Care Outcomes    Palliative Care Outcomes  Improved pain interventions, Clarified goals of care, Counseled regarding hospice, Changed to focus on comfort, Transitioned to hospice   Patient/Family wishes: Interventions discontinued/not started   Mechanical Ventilation, NIPPV, Trach, BiPAP, Hemodialysis, PEG, Transfusion, Vasopressors, Antibiotics, Tube feedings/TPN   Palliative Care follow-up planned  Yes, Facility      Additional Data Reviewed: CBC    Component Value Date/Time   WBC 8.3 11/19/2015 2209   WBC 5.2 08/15/2015 1208   WBC 5.5 07/22/2015 1343   RBC 4.02 11/19/2015 2209   RBC 4.63 08/15/2015 1208   RBC 4.48 07/22/2015 1343   HGB 11.7* 11/19/2015 2209   HGB 13.4 08/15/2015 1208   HGB 13.0 07/22/2015 1343   HCT 35.3* 11/19/2015 2209   HCT 40.2 08/15/2015 1208   HCT 38.6 07/22/2015 1343   PLT 253 11/19/2015 2209   PLT 253 08/15/2015 1208   PLT 295 07/22/2015 1343   MCV 87.8 11/19/2015 2209   MCV 87 08/15/2015 1208   MCV 86.2 07/22/2015 1343    MCH 29.1 11/19/2015 2209   MCH 28.9 08/15/2015 1208   MCH 29.0 07/22/2015 1343   MCHC 33.1 11/19/2015 2209   MCHC 33.3 08/15/2015 1208   MCHC 33.7 07/22/2015 1343   RDW 13.9 11/19/2015 2209   RDW 13.9 08/15/2015 1208   RDW 13.6 07/22/2015 1343   LYMPHSABS 1.3 08/15/2015 1208   LYMPHSABS 2.6 07/30/2015 1721   LYMPHSABS 1.7 07/22/2015 1343   MONOABS 0.6 07/30/2015 1721   MONOABS 0.6 07/22/2015 1343   EOSABS 0.1 08/15/2015 1208   EOSABS 0.2 07/30/2015 1721   EOSABS 0.1 07/22/2015 1343   BASOSABS 0.0 08/15/2015 1208   BASOSABS 0.1 07/30/2015 1721   BASOSABS 0.0 07/22/2015 1343    CMP     Component Value Date/Time   NA 144 11/19/2015 2209   NA 145 07/22/2015 1343   NA 140 10/03/2014 0844   K 3.9 11/19/2015 2209   K 3.9 07/22/2015 1343   K 3.5 10/03/2014 0844   CL 106 11/19/2015 2209   CL 100 10/03/2014 0844   CO2 27 11/19/2015 2209   CO2 27 07/22/2015 1343   CO2 27 10/03/2014 0844   GLUCOSE 154* 11/19/2015 2209   GLUCOSE 90 07/22/2015 1343   GLUCOSE 87 10/03/2014 0844   BUN 22* 11/19/2015 2209   BUN 17.7 07/22/2015 1343   BUN 19 10/03/2014 0844   CREATININE 1.27* 11/19/2015 2209   CREATININE 0.9 07/22/2015 1343   CREATININE 1.4* 10/03/2014 0844   CALCIUM 8.7* 11/19/2015 2209   CALCIUM 9.4 07/22/2015 1343   CALCIUM 8.9 10/03/2014 0844   PROT 6.3* 11/19/2015 2209   PROT 7.3 07/22/2015 1343   PROT 7.5 10/03/2014  0844   ALBUMIN 2.8* 11/19/2015 2209   ALBUMIN 3.4* 07/22/2015 1343   ALBUMIN 3.5 10/03/2014 0844   AST 22 11/19/2015 2209   AST 44* 07/22/2015 1343   AST 14 10/03/2014 0844   ALT 19 11/19/2015 2209   ALT 56* 07/22/2015 1343   ALT 11 10/03/2014 0844   ALKPHOS 98 11/19/2015 2209   ALKPHOS 285* 07/22/2015 1343   ALKPHOS 108* 10/03/2014 0844   BILITOT 0.7 11/19/2015 2209   BILITOT 0.87 07/22/2015 1343   BILITOT 0.60 10/03/2014 0844   GFRNONAA 40* 11/19/2015 2209   GFRAA 46* 11/19/2015 2209       Problem List:  Patient Active Problem List    Diagnosis Date Noted  . Pressure ulcer 11/21/2015  . Palliative care encounter   . DNR (do not resuscitate) discussion   . Severe dementia   . Mental status, decreased 11/20/2015  . Acute encephalopathy 11/20/2015  . Wound of left leg 11/20/2015  . Cellulitis of left lower leg   . Anxiety state 09/08/2015  . Appendicitis, acute 07/24/2015  . Acute appendicitis s/p lap appy 07/25/2015 07/24/2015  . Dementia with behavioral disturbance 04/29/2015  . CAP (community acquired pneumonia) 12/13/2014  . SOB (shortness of breath) 12/13/2014  . Generalized abdominal pain   . UTI (urinary tract infection) 06/08/2014  . Acute pulmonary embolism ([place]) 02/28/2014  . Right ventricular dysfunction 02/28/2014  . Chest pain 02/27/2014  . Dehydration 02/25/2014  . AKI (acute kidney injury) ([place]) 02/25/2014  . Diastolic CHF, chronic ([place]) 02/25/2014  . Hypokalemia 05/20/2013  . Atrial fibrillation ([place]) 05/20/2013  . Non-small cell cancer of right lung ([place]) 09/15/2011  . Dyspnea 08/09/2011  . LEG PAIN 12/30/2009  . EDEMA 12/30/2009  . NONSPECIFIC ABN FINDNG RAD&OTH EXAM BILARY TRCT 09/01/2009  . PERSONAL HX COLONIC POLYPS 09/01/2009  . GERD 07/29/2009  . BACK PAIN, CHRONIC 05/22/2009  . VITAMIN D DEFICIENCY 02/12/2009  . ANEMIA 02/12/2009  . PERIPHERAL NEUROPATHY 02/12/2009  . NECK PAIN, RIGHT 02/16/2008  . Hypothyroidism 08/24/2007  . HYPERLIPIDEMIA 06/06/2007  . Essential hypertension 06/06/2007  . ARTHRITIS, HX OF 06/06/2007     Palliative Care Assessment & Plan    1.Code Status:  DNR    Code Status Orders        Start     Ordered   11/20/15 0259  Do not attempt resuscitation (DNR)   Continuous    Question Answer Comment  In the event of cardiac or respiratory ARREST Do not call a "code blue"   In the event of cardiac or respiratory ARREST Do not perform Intubation, CPR, defibrillation or ACLS   In the event of cardiac or respiratory ARREST Use medication by any route,  position, wound care, and other measures to relive pain and suffering. May use oxygen, suction and manual treatment of airway obstruction as needed for comfort.      11/20/15 0259       2. Goals of Care/Additional Recommendations:  Hopeful for in-pt hospice  Desire for further Chaplaincy support:no  Psycho-social Needs: Grief/Bereavement Support  3. Symptom Management:      1.Pain: Cont ms04 concentrate q4 atc and q2 prn. Monitor for need to up titrate dosing 2. Agitation: Cont haldol and ativan prn. Monitor for need for scheduled dosing  4. Palliative Prophylaxis:   Bowel Regimen, Delirium Protocol, Frequent Pain Assessment, Palliative Wound Care and Turn Reposition  5. Prognosis: < 4 weeks  6. Discharge Planning:  Hospice facility     Thank you  for allowing the Palliative Medicine Team to assist in the care of this patient.   Time In: 1400 Time Out: 1430 Total Time 30 min Prolonged Time Billed  no         Dory Horn, NP  11/22/2015, 1:59 PM  Please contact Palliative Medicine Team phone at [phone] for questions and concerns.

## 2015-11-23 MED ORDER — MORPHINE SULFATE (CONCENTRATE) 10 MG/0.5ML PO SOLN: 5.0000 mg | ORAL | Status: AC | PRN

## 2015-11-23 MED ORDER — MORPHINE SULFATE (CONCENTRATE) 10 MG/0.5ML PO SOLN: 5.0000 mg | ORAL | Status: AC

## 2015-11-23 MED ORDER — SENNA 8.6 MG PO TABS: 2.0000 | ORAL_TABLET | Freq: Every day | ORAL | Status: AC

## 2015-11-23 MED ORDER — LORAZEPAM 2 MG/ML IJ SOLN: 0.5000 mg | INTRAMUSCULAR | Status: AC | PRN

## 2015-11-23 MED ORDER — POLYETHYLENE GLYCOL 3350 17 G PO PACK
17.0000 g | PACK | Freq: Every day | ORAL | Status: DC
  Administered 2015-11-23: 17 g via ORAL
  Filled 2015-11-23 (×2): qty 1

## 2015-11-23 MED ORDER — HALOPERIDOL LACTATE 5 MG/ML IJ SOLN: 2.0000 mg | Freq: Four times a day (QID) | INTRAMUSCULAR | Status: AC | PRN

## 2015-11-23 MED ORDER — BISACODYL 10 MG RE SUPP: 10.0000 mg | RECTAL | Status: AC

## 2015-11-23 MED ORDER — POLYETHYLENE GLYCOL 3350 17 G PO PACK: 17.0000 g | PACK | Freq: Every day | ORAL | Status: AC

## 2015-11-23 MED ORDER — LORAZEPAM 0.5 MG PO TABS: 0.5000 mg | ORAL_TABLET | ORAL | Status: AC | PRN

## 2015-11-23 NOTE — Discharge Summary (Signed)
[facility]  Physician Discharge Summary   Patient ID: Alexa [name] MRN: [id number] DOB/AGE: 06-26-1936 79 y.o.  Admit date: 11/19/2015 Discharge date: 11/24/2015  PCP: Laurey Morale, MD  DISCHARGE DIAGNOSES:  Principal Problem:   Acute encephalopathy Active Problems:   Hypothyroidism   Essential hypertension   Dementia with behavioral disturbance   Wound of left leg   Cellulitis of left lower leg   Palliative care encounter   DNR (do not resuscitate) discussion   Pressure ulcer   Severe dementia   RECOMMENDATIONS FOR OUTPATIENT FOLLOW UP: 1. Patient being discharged to residential hospice.   DISCHARGE CONDITION: fair  Diet recommendation: Comfort feeds as tolerated  Filed Weights   11/19/15 2145 11/20/15 0158  Weight: 63.504 kg (140 lb) 72.7 kg (160 lb 4.4 oz)    INITIAL HISTORY: 79 year old Caucasian female with past medical history of advanced dementia, diastolic CHF, history of PE, not on anticoagulation, hypertension, was brought in after a fall at home. Over the last few weeks. Patient had been increasingly confused per patient's family. She was seen by her neurologist recently and was thought to have end-stage dementia. She was thought to have findings suggestive of lower extremity cellulitis. She was hospitalized for further management. Palliative medicine was consulted. Patient thought to have advanced dementia, possibly end-stage. She was considered a hospice candidate.  Consultations:  [place] COURSE:   Acute encephalopathy superimprosed on Dementia This is most likely end-stage dementia. Acute infection may have contributing to acute worsening, but not to a great extent. Patient was seen by palliative medicine. Full comfort care has been initiated. Plan is for discharge to a residential hospice facility.   Cellulitis of left lower leg  She was placed on antibiotics initially. Now she is full comfort  care.  Chronic pain Continue morphine. Bowel regimen.  Chronic diastolic heart failure: Seems to be compensated. Should be okay to continue her Lasix at discharge as it may help prevent pulmonary edema.  Hypothyroidism Synthroid was discontinued considering comfort care status.  Essential hypertension Continue current regimen. But may stop at discretion of hospice MD.  Overall stable. Prognosis is likely a few weeks. [place] for discharge of bed is available.  PERTINENT LABS:  The results of significant diagnostics from this hospitalization (including imaging, microbiology, ancillary and laboratory) are listed below for reference.    Microbiology: Recent Results (from the past 240 hour(s))  Blood culture (routine x 2)     Status: None (Preliminary result)   Collection Time: 11/19/15 10:19 PM  Result Value Ref Range Status   Specimen Description BLOOD RIGHT ARM  Final   Special Requests BOTTLES DRAWN AEROBIC AND ANAEROBIC 5CC  Final   Culture NO GROWTH 3 DAYS  Final   Report Status PENDING  Incomplete  Blood culture (routine x 2)     Status: None (Preliminary result)   Collection Time: 11/19/15 10:25 PM  Result Value Ref Range Status   Specimen Description BLOOD RIGHT HAND  Final   Special Requests BOTTLES DRAWN AEROBIC AND ANAEROBIC 5CC  Final   Culture NO GROWTH 3 DAYS  Final   Report Status PENDING  Incomplete     Labs: Basic Metabolic Panel:  Recent Labs Lab 11/19/15 2209  NA 144  K 3.9  CL 106  CO2 27  GLUCOSE 154*  BUN 22*  CREATININE 1.27*  CALCIUM 8.7*   Liver Function Tests:  Recent Labs Lab 11/19/15 2209  AST 22  ALT 19  ALKPHOS 98  BILITOT 0.7  PROT 6.3*  ALBUMIN 2.8*   CBC:  Recent Labs Lab 11/19/15 2209  WBC 8.3  HGB 11.7*  HCT 35.3*  MCV 87.8  PLT 253    IMAGING STUDIES Dg Tibia/fibula Left  11/19/2015  CLINICAL DATA:  Fall with left lower leg wound. EXAM: LEFT TIBIA AND FIBULA - 2 VIEW COMPARISON:  None. FINDINGS: Skin  irregularity and possible soft tissue gas anteriorly at the lower shin, correlating with history of wound in this location. There is no evidence of tracking subcutaneous gas or opaque foreign body. Subcutaneous fat reticulation throughout the leg, potential cellulitis. No evidence of osseous infection or fracture. Knee osteoarthritis, mild for age, with chondrocalcinosis. IMPRESSION: Soft tissue swelling without acute osseous abnormality, as above. Electronically Signed   By: Monte Fantasia M.D.   On: 11/19/2015 23:29   Ct Head Wo Contrast  11/20/2015  CLINICAL DATA:  Multiple falls over the past few days. Altered mental status. EXAM: CT HEAD WITHOUT CONTRAST TECHNIQUE: Contiguous axial images were obtained from the base of the skull through the vertex without intravenous contrast. COMPARISON:  03/20/2014 FINDINGS: There is no evidence of mass effect, midline shift or extra-axial fluid collections. There is no evidence of a space-occupying lesion or intracranial hemorrhage. There is periventricular white matter low attenuation likely secondary to microangiopathy. 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 There is low-attenuation in the left posterior parietal lobe most consistent with a subacute versus chronic infarct. There is a small all left cerebellar lacunar infarct. The ventricles and sulci are appropriate for the patient's age. The basal cisterns are patent. Visualized portions of the orbits are unremarkable. The visualized portions of the paranasal sinuses and mastoid air cells are unremarkable. The osseous structures are unremarkable. IMPRESSION: 1. Low-attenuation in the left posterior parietal lobe most consistent with a subacute versus chronic infarct. Electronically Signed   By: Kathreen Devoid   On: 11/20/2015 00:02    DISCHARGE EXAMINATION: Filed Vitals:   11/23/15 6759 11/23/15 1838 11/23/15 2127 11/24/15 0616  BP: 156/90 137/84 134/71 108/61  Pulse: 94 97 91 68   Temp: 98.1 F (36.7 C) 98 F (36.7 C) 98.9 F (37.2 C) 97.5 F (36.4 C)  TempSrc: Axillary Oral Oral Axillary  Resp: '20 18 18 16  '$ Height:      Weight:      SpO2: 99% 98% 97% 98%   General appearance: fatigued Resp: clear to auscultation bilaterally Cardio: regular rate and rhythm, S1, S2 normal, no murmur, click, rub or gallop GI: soft, non-tender; bowel sounds normal; no masses,  no organomegaly  DISPOSITION: Residential Hospice.   ALLERGIES:  Allergies  Allergen Reactions  . Simvastatin Other (See Comments)    myalgia  . Statins Other (See Comments)    Myalgias with multiple statins   . Strawberry Extract Swelling    Swelling ,rash  . Strawberry Extract Hives  . Hydrocodone-Homatropine Nausea Only    No problems with oxycodone  . Nsaids Other (See Comments)    unknown  . Penicillins Rash     Current Discharge Medication List    START taking these medications   Details  bisacodyl (DULCOLAX) 10 MG suppository Place 1 suppository (10 mg total) rectally every other day. Qty: 12 suppository, Refills: 0    haloperidol lactate (HALDOL) 5 MG/ML injection Inject 0.4 mLs (2 mg total) into the vein every 6 (six) hours as needed. Qty: 1 mL    LORazepam (ATIVAN) 2 MG/ML injection Inject 0.25-0.5 mLs (  0.5-1 mg total) into the vein every 4 (four) hours as needed for anxiety or sedation. Qty: 1 mL, Refills: 0    !! Morphine Sulfate (MORPHINE CONCENTRATE) 10 MG/0.5ML SOLN concentrated solution Place 0.25 mLs (5 mg total) under the tongue every 4 (four) hours. Qty: 42 mL    !! Morphine Sulfate (MORPHINE CONCENTRATE) 10 MG/0.5ML SOLN concentrated solution Place 0.25-0.5 mLs (5-10 mg total) under the tongue every 2 (two) hours as needed for moderate pain, severe pain or shortness of breath. Qty: 42 mL    polyethylene glycol (MIRALAX / GLYCOLAX) packet Take 17 g by mouth daily. Qty: 14 each, Refills: 0    senna (SENOKOT) 8.6 MG TABS tablet Take 2 tablets (17.2 mg total) by  mouth at bedtime. Qty: 120 each, Refills: 0     !! - Potential duplicate medications found. Please discuss with provider.    CONTINUE these medications which have CHANGED   Details  LORazepam (ATIVAN) 0.5 MG tablet Take 1-2 tablets (0.5-1 mg total) by mouth every 4 (four) hours as needed for anxiety. Qty: 30 tablet, Refills: 0      CONTINUE these medications which have NOT CHANGED   Details  amLODipine (NORVASC) 2.5 MG tablet Take 1 tablet (2.5 mg total) by mouth daily. Qty: 90 tablet, Refills: 3   Associated Diagnoses: Diastolic CHF, chronic (HCC)    docusate sodium (COLACE) 100 MG capsule Take 1 capsule (100 mg total) by mouth 2 (two) times daily. Qty: 60 capsule, Refills: 11    furosemide (LASIX) 40 MG tablet Take 40 mg by mouth daily.     risperiDONE (RISPERDAL) 0.25 MG tablet TAKE 1 TABLET BY MOUTH AT BEDTIME Qty: 30 tablet, Refills: 5    silver sulfADIAZINE (SILVADENE) 1 % cream Apply 1 application topically daily. Qty: 50 g, Refills: 2    valsartan (DIOVAN) 320 MG tablet Take 1 tablet (320 mg total) by mouth daily. Qty: 90 tablet, Refills: 3   Associated Diagnoses: Diastolic CHF, chronic ([place]); Pulmonary HTN (HCC)      STOP taking these medications     cephALEXin (KEFLEX) 250 MG/5ML suspension      dicyclomine (BENTYL) 10 MG capsule      esomeprazole (NEXIUM) 40 MG capsule      levothyroxine (SYNTHROID, LEVOTHROID) 137 MCG tablet      morphine (MS CONTIN) 15 MG 12 hr tablet      oxyCODONE-acetaminophen (PERCOCET) 7.5-325 MG per tablet      potassium chloride SA (K-DUR,KLOR-CON) 20 MEQ tablet      temazepam (RESTORIL) 30 MG capsule          TOTAL DISCHARGE TIME: 35 mins  [facility]  [facility] Pager [phone]  11/24/2015, 9:48 AM

## 2015-11-23 NOTE — Progress Notes (Signed)
[facility] PROGRESS NOTE  Alexa [name] KKX:[id number] DOB: 05-12-1936 DOA: 11/19/2015  PCP: Laurey Morale, MD  Brief HPI: 79 year old Caucasian female with past medical history of advanced dementia, diastolic CHF, history of PE, not on anticoagulation, hypertension, was brought in after a fall at home. Over the last few weeks. Patient had been increasingly confused per patient's family. She was seen by her neurologist recently and was thought to have end-stage dementia. She was thought to have findings suggestive of lower extremity cellulitis. She was hospitalized for further management. Palliative medicine was consulted. Patient thought to have advanced dementia, possibly end-stage. She was considered a hospice candidate.  Past medical history:  Past Medical History  Diagnosis Date  . Alzheimer disease   . Hyperlipidemia     a. Myalgias with multiple statins.  . Hypertension   . GERD (gastroesophageal reflux disease)   . Diverticulosis   . Hx of adenomatous colonic polyps   . DVT (deep venous thrombosis) ([place]) 09-30-11; 01/2014    RLE; RLE  . Hypothyroidism   . Asthma   . History of tobacco abuse     a. quit 25+ years ago. PFTs in 2/15 were relatively normal.   . Chronic diastolic CHF (congestive heart failure) ([place])     a. ETT-myoview (9/12) with 4' exercise, EF 68%, no ischemia or infarction. b. Echo (10/14) with EF 55-60%, mod diast dysfunction, normal RV size/fcn, mod pulm HTN.   . Venous insufficiency     a. H/o venous stasis ulceration.   Marland Kitchen PAF (paroxysmal atrial fibrillation) ([place])     a. Only episode that noted was in setting of right upper lobectomy in 2014. She was briefly on amiodarone but stopped it on her own. She is not coumadin. If recurrent AF, will need to consider.  . Pulmonary HTN ([place])     a. Moderate by echo 08/2013.  Marland Kitchen Cerebral infarct ([place])     a.  Remote anterior left frontal lobe infarct by CT head 01/2014.  . PE (pulmonary embolism) 01/2014  .  Pneumonia     "couple times maybe" (12/13/2014)  . Arthritis     "qwhere"  . DDD (degenerative disc disease)     a. cervical and lumbar herniation and DJD s/p multiple diskectomies and other back surgeries from 2002-2008   . Chronic lower back pain   . Alzheimer's dementia     a. sees Dr. Brett Fairy. b. on Aricept.; "stage 6" (12/13/2014)  . Kidney stones   . breast dx'd 2002    left  . Adenocarcinoma of breast ([place])     a. s/p right simple and left modified radical mastectomy with chemotherapy and radiation in 2002.   . Non-small cell lung cancer ([place])     a. s/p right upper lobectomy in 2014.   Marland Kitchen ADENOCARCINOMA, BREAST, BILATERAL, HX OF 02/22/2008    Qualifier: Diagnosis of  By: Niel Hummer MD, [place], RIGHT SHOULDER 12/15/2010    Qualifier: Diagnosis of  By: Niel Hummer MD, Lorinda Creed   . SAH (subarachnoid hemorrhage) ([place]) 03/12/2014  . Ventricular tachycardia ([place]) 02/28/2014    4 beat run ventricular tachycardia in the hospital, February 28, 2014     Consultants: Palliative medicine  Procedures: None  Antibiotics: None  Subjective: Patient is pleasantly confused. Her daughters are at the bedside. Per nurse, Oral intake slightly improved this morning. Doesn't communicate.   Objective: Vital Signs  Filed Vitals:   11/22/15 2134 11/22/15 2200  11/23/15 0053 11/23/15 0553  BP: 162/98 163/85 155/69 156/90  Pulse: 93  92 94  Temp: 98.1 F (36.7 C)  98.3 F (36.8 C) 98.1 F (36.7 C)  TempSrc: Oral  Axillary Axillary  Resp: '20  20 20  '$ Height:      Weight:      SpO2: 99%  99% 99%   No intake or output data in the 24 hours ending 11/23/15 0944 Filed Weights   11/19/15 2145 11/20/15 0158  Weight: 63.504 kg (140 lb) 72.7 kg (160 lb 4.4 oz)    General appearance: alert, distracted, appears stated age Resp: clear to auscultation bilaterally Cardio: regular rate and rhythm, S1, S2 normal, no murmur, click, rub or gallop GI: soft, non-tender; bowel sounds  normal; no masses,  no organomegaly Neurologic: Pleasantly confused. No focal deficits.  Lab Results:  Basic Metabolic Panel:  Recent Labs Lab 11/19/15 2209  NA 144  K 3.9  CL 106  CO2 27  GLUCOSE 154*  BUN 22*  CREATININE 1.27*  CALCIUM 8.7*   Liver Function Tests:  Recent Labs Lab 11/19/15 2209  AST 22  ALT 19  ALKPHOS 98  BILITOT 0.7  PROT 6.3*  ALBUMIN 2.8*   CBC:  Recent Labs Lab 11/19/15 2209  WBC 8.3  HGB 11.7*  HCT 35.3*  MCV 87.8  PLT 253   BNP (last 3 results)  Recent Labs  12/13/14 1435  BNP 568.1*     Recent Results (from the past 240 hour(s))  Blood culture (routine x 2)     Status: None (Preliminary result)   Collection Time: 11/19/15 10:19 PM  Result Value Ref Range Status   Specimen Description BLOOD RIGHT ARM  Final   Special Requests BOTTLES DRAWN AEROBIC AND ANAEROBIC 5CC  Final   Culture NO GROWTH 2 DAYS  Final   Report Status PENDING  Incomplete  Blood culture (routine x 2)     Status: None (Preliminary result)   Collection Time: 11/19/15 10:25 PM  Result Value Ref Range Status   Specimen Description BLOOD RIGHT HAND  Final   Special Requests BOTTLES DRAWN AEROBIC AND ANAEROBIC 5CC  Final   Culture NO GROWTH 2 DAYS  Final   Report Status PENDING  Incomplete      Studies/Results: No results found.  Medications:  Scheduled: . amLODipine  2.5 mg Oral Daily  . bisacodyl  10 mg Rectal QODAY  . irbesartan  300 mg Oral Daily  . morphine CONCENTRATE  5 mg Sublingual 6 times per day  . risperiDONE  0.5 mg Oral QHS  . senna  2 tablet Oral QHS  . silver sulfADIAZINE  1 application Topical Daily   Continuous:  GQB:[id number] **OR** acetaminophen, haloperidol lactate, LORazepam, LORazepam, morphine CONCENTRATE, ondansetron **OR** ondansetron (ZOFRAN) IV  Assessment/Plan:  Principal Problem:   Acute encephalopathy Active Problems:   Hypothyroidism   Essential hypertension   Dementia with behavioral disturbance    Wound of left leg   Cellulitis of left lower leg   Palliative care encounter   DNR (do not resuscitate) discussion   Pressure ulcer   Severe dementia    Acute encephalopathy superimprosed on Dementia This is most likely end-stage dementia. Acute infection may have contributing to acute worsening, but not to a great extent. Patient was seen by palliative medicine. Full comfort care has been initiated. Plan is for discharge to a residential hospice facility.   Cellulitis of left lower leg  She was placed on antibiotics initially. Now she  is full comfort care.  Chronic pain Continue morphine.  Chronic diastolic heart failure: Seems to be compensated. Should be okay to continue her Lasix at discharge as it may help prevent pulmonary edema.  Hypothyroidism Synthroid was discontinued considering comfort care status.  Essential hypertension Continue current regimen.  DVT Prophylaxis: No need for DVT prophylaxis due to comfort care    Code Status: DO NOT RESUSCITATE  Family Communication: Discussed with the daughters  Disposition Plan: Await placement to residential hospice     LOS: 3 days   [facility] Pager [phone] 11/23/2015, 9:44 AM  If 7PM-7AM, please contact night-coverage at [email or web address], password [facility]

## 2015-11-24 NOTE — Care Management Note (Signed)
Case Management Note  Patient Details  Name: Alexa [name] MRN: [id number] Date of Birth: [birth year]  Subjective/Objective:                    Action/Plan: Plan is for patient to discharge to [facility] today. No further needs per CM.   Expected Discharge Date:                  Expected Discharge Plan:  [place]  In-House Referral:     Discharge planning Services     Post Acute Care Choice:    Choice offered to:     DME Arranged:    DME Agency:     HH Arranged:    [place] Agency:     Status of Service:  Completed, signed off  Medicare Important Message Given:    Date Medicare IM Given:    Medicare IM give by:    Date Additional Medicare IM Given:    Additional Medicare Important Message give by:     If discussed at [place] of Stay Meetings, dates discussed:    Additional Comments:  Pollie Friar, RN 11/24/2015, 11:33 AM

## 2015-11-24 NOTE — Clinical Social Work Note (Signed)
CSW spoke with patient's family patient to be d/c'ed today to [facility].  Discharge summary has been faxed to [facility].  Patient and family agreeable to plans will transport via ems RN to call report to [phone].  Evette Cristal, MSW, [place]

## 2015-11-24 NOTE — Progress Notes (Signed)
[facility] Liaison: RN visit. Received request from [place] for family interest in [facility] with request for transfer today. Chart reviewed and report received from Nyra Market, NP. Met with  Patient and her 3 daughters at the bedside to confirm interest and explain services. Family agreeable to transfer today. CSW aware. Registration paper work completed . Dr. Orpah Melter to assume care per family request. Please fax discharge summary to [phone]. RN please call report to [phone]. Please arrange transport by [facility]. Thank you.   [facility] Liaison [phone]

## 2015-11-24 NOTE — Progress Notes (Signed)
Patient being transferred to [facility] place she is clean and dry no IV's removed awaiting transport and attempted to report call report to [facility] place but no answer will try again later.

## 2015-11-25 LAB — CULTURE, BLOOD (ROUTINE X 2)
CULTURE: NO GROWTH
Culture: NO GROWTH

## 2015-12-10 ENCOUNTER — Telehealth: Payer: Self-pay | Admitting: Family Medicine

## 2015-12-10 NOTE — Telephone Encounter (Signed)
Pt daughter [name] is calling to let md know her mother passed away on December 17, 2015

## 2015-12-31 ENCOUNTER — Ambulatory Visit: Payer: Self-pay | Admitting: Podiatry

## 2015-12-31 DEATH — deceased

## 2016-02-16 ENCOUNTER — Other Ambulatory Visit: Payer: Medicare Other

## 2016-02-16 ENCOUNTER — Ambulatory Visit: Payer: Medicare Other | Admitting: Family

## 2016-02-26 ENCOUNTER — Ambulatory Visit: Payer: Medicare Other | Admitting: Adult Health

## 2024-01-03 NOTE — Progress Notes (Signed)
 This encounter was created in error - please disregard.
